# Patient Record
Sex: Male | Born: 1955
Health system: Southern US, Community
[De-identification: ages and names within clinical notes are randomized; demographics above are authoritative.]

## PROBLEM LIST (undated history)

## (undated) ENCOUNTER — Ambulatory Visit: Admission: EM

## (undated) ENCOUNTER — Emergency Department (HOSPITAL_COMMUNITY): Admission: EM | Payer: Medicare Other | Source: Home / Self Care

## (undated) DIAGNOSIS — F32A Depression, unspecified: Secondary | ICD-10-CM

## (undated) DIAGNOSIS — M549 Dorsalgia, unspecified: Secondary | ICD-10-CM

## (undated) DIAGNOSIS — K2970 Gastritis, unspecified, without bleeding: Secondary | ICD-10-CM

## (undated) DIAGNOSIS — K5792 Diverticulitis of intestine, part unspecified, without perforation or abscess without bleeding: Secondary | ICD-10-CM

## (undated) DIAGNOSIS — R110 Nausea: Secondary | ICD-10-CM

## (undated) DIAGNOSIS — I10 Essential (primary) hypertension: Secondary | ICD-10-CM

## (undated) DIAGNOSIS — R339 Retention of urine, unspecified: Secondary | ICD-10-CM

## (undated) DIAGNOSIS — G8929 Other chronic pain: Secondary | ICD-10-CM

## (undated) DIAGNOSIS — N200 Calculus of kidney: Secondary | ICD-10-CM

## (undated) DIAGNOSIS — Z940 Kidney transplant status: Secondary | ICD-10-CM

## (undated) DIAGNOSIS — M25559 Pain in unspecified hip: Secondary | ICD-10-CM

## (undated) DIAGNOSIS — F329 Major depressive disorder, single episode, unspecified: Secondary | ICD-10-CM

## (undated) DIAGNOSIS — N289 Disorder of kidney and ureter, unspecified: Secondary | ICD-10-CM

## (undated) HISTORY — PX: KIDNEY TRANSPLANT: SHX239

## (undated) HISTORY — PX: NEPHRECTOMY TRANSPLANTED ORGAN: SUR880

## (undated) HISTORY — PX: HERNIA REPAIR: SHX51

## (undated) HISTORY — PX: HIP SURGERY: SHX245

---

## 2000-09-25 ENCOUNTER — Encounter: Payer: Self-pay | Admitting: Emergency Medicine

## 2000-09-25 ENCOUNTER — Emergency Department (HOSPITAL_COMMUNITY): Admission: EM | Admit: 2000-09-25 | Discharge: 2000-09-25 | Payer: Self-pay | Admitting: Emergency Medicine

## 2000-12-25 ENCOUNTER — Emergency Department (HOSPITAL_COMMUNITY): Admission: EM | Admit: 2000-12-25 | Discharge: 2000-12-25 | Payer: Self-pay | Admitting: Emergency Medicine

## 2000-12-25 ENCOUNTER — Encounter: Payer: Self-pay | Admitting: Emergency Medicine

## 2000-12-28 ENCOUNTER — Emergency Department (HOSPITAL_COMMUNITY): Admission: EM | Admit: 2000-12-28 | Discharge: 2000-12-28 | Payer: Self-pay | Admitting: Emergency Medicine

## 2001-08-04 ENCOUNTER — Emergency Department (HOSPITAL_COMMUNITY): Admission: EM | Admit: 2001-08-04 | Discharge: 2001-08-04 | Payer: Self-pay | Admitting: *Deleted

## 2001-08-04 ENCOUNTER — Encounter: Payer: Self-pay | Admitting: *Deleted

## 2001-09-08 ENCOUNTER — Encounter: Payer: Self-pay | Admitting: Emergency Medicine

## 2001-09-08 ENCOUNTER — Emergency Department (HOSPITAL_COMMUNITY): Admission: EM | Admit: 2001-09-08 | Discharge: 2001-09-08 | Payer: Self-pay | Admitting: Emergency Medicine

## 2001-10-20 ENCOUNTER — Encounter: Payer: Self-pay | Admitting: *Deleted

## 2001-10-20 ENCOUNTER — Emergency Department (HOSPITAL_COMMUNITY): Admission: EM | Admit: 2001-10-20 | Discharge: 2001-10-20 | Payer: Self-pay | Admitting: Emergency Medicine

## 2001-11-18 ENCOUNTER — Encounter: Payer: Self-pay | Admitting: Emergency Medicine

## 2001-11-18 ENCOUNTER — Emergency Department (HOSPITAL_COMMUNITY): Admission: EM | Admit: 2001-11-18 | Discharge: 2001-11-18 | Payer: Self-pay | Admitting: Emergency Medicine

## 2002-01-23 ENCOUNTER — Emergency Department (HOSPITAL_COMMUNITY): Admission: EM | Admit: 2002-01-23 | Discharge: 2002-01-23 | Payer: Self-pay | Admitting: Emergency Medicine

## 2002-01-23 ENCOUNTER — Encounter: Payer: Self-pay | Admitting: Emergency Medicine

## 2002-01-30 ENCOUNTER — Encounter: Payer: Self-pay | Admitting: Emergency Medicine

## 2002-01-30 ENCOUNTER — Emergency Department (HOSPITAL_COMMUNITY): Admission: EM | Admit: 2002-01-30 | Discharge: 2002-01-30 | Payer: Self-pay | Admitting: Emergency Medicine

## 2002-04-11 ENCOUNTER — Encounter: Payer: Self-pay | Admitting: Internal Medicine

## 2002-04-11 ENCOUNTER — Emergency Department (HOSPITAL_COMMUNITY): Admission: EM | Admit: 2002-04-11 | Discharge: 2002-04-11 | Payer: Self-pay | Admitting: Internal Medicine

## 2002-04-29 ENCOUNTER — Encounter: Payer: Self-pay | Admitting: Emergency Medicine

## 2002-04-29 ENCOUNTER — Emergency Department (HOSPITAL_COMMUNITY): Admission: EM | Admit: 2002-04-29 | Discharge: 2002-04-29 | Payer: Self-pay | Admitting: Emergency Medicine

## 2002-05-01 ENCOUNTER — Encounter: Payer: Self-pay | Admitting: *Deleted

## 2002-05-01 ENCOUNTER — Emergency Department (HOSPITAL_COMMUNITY): Admission: EM | Admit: 2002-05-01 | Discharge: 2002-05-01 | Payer: Self-pay | Admitting: *Deleted

## 2002-06-14 ENCOUNTER — Encounter: Payer: Self-pay | Admitting: Emergency Medicine

## 2002-06-14 ENCOUNTER — Emergency Department (HOSPITAL_COMMUNITY): Admission: EM | Admit: 2002-06-14 | Discharge: 2002-06-14 | Payer: Self-pay | Admitting: Emergency Medicine

## 2002-06-15 ENCOUNTER — Encounter: Payer: Self-pay | Admitting: Orthopedic Surgery

## 2002-06-15 ENCOUNTER — Inpatient Hospital Stay (HOSPITAL_COMMUNITY): Admission: EM | Admit: 2002-06-15 | Discharge: 2002-06-16 | Payer: Self-pay | Admitting: Emergency Medicine

## 2002-07-28 ENCOUNTER — Encounter: Payer: Self-pay | Admitting: Internal Medicine

## 2002-07-28 ENCOUNTER — Emergency Department (HOSPITAL_COMMUNITY): Admission: EM | Admit: 2002-07-28 | Discharge: 2002-07-28 | Payer: Self-pay | Admitting: Internal Medicine

## 2002-08-15 ENCOUNTER — Encounter (HOSPITAL_COMMUNITY): Admission: RE | Admit: 2002-08-15 | Discharge: 2002-09-14 | Payer: Self-pay | Admitting: Orthopedic Surgery

## 2002-11-29 ENCOUNTER — Emergency Department (HOSPITAL_COMMUNITY): Admission: EM | Admit: 2002-11-29 | Discharge: 2002-11-29 | Payer: Self-pay | Admitting: Emergency Medicine

## 2002-12-25 ENCOUNTER — Emergency Department (HOSPITAL_COMMUNITY): Admission: EM | Admit: 2002-12-25 | Discharge: 2002-12-25 | Payer: Self-pay | Admitting: Emergency Medicine

## 2002-12-25 ENCOUNTER — Encounter: Payer: Self-pay | Admitting: Emergency Medicine

## 2003-01-21 ENCOUNTER — Ambulatory Visit (HOSPITAL_COMMUNITY): Admission: RE | Admit: 2003-01-21 | Discharge: 2003-01-21 | Payer: Self-pay | Admitting: *Deleted

## 2003-01-21 ENCOUNTER — Encounter: Payer: Self-pay | Admitting: Urology

## 2003-01-26 ENCOUNTER — Emergency Department (HOSPITAL_COMMUNITY): Admission: EM | Admit: 2003-01-26 | Discharge: 2003-01-26 | Payer: Self-pay | Admitting: Emergency Medicine

## 2003-01-26 ENCOUNTER — Encounter: Payer: Self-pay | Admitting: Emergency Medicine

## 2003-02-07 ENCOUNTER — Ambulatory Visit (HOSPITAL_COMMUNITY): Admission: RE | Admit: 2003-02-07 | Discharge: 2003-02-07 | Payer: Self-pay | Admitting: Urology

## 2003-02-14 ENCOUNTER — Emergency Department (HOSPITAL_COMMUNITY): Admission: EM | Admit: 2003-02-14 | Discharge: 2003-02-14 | Payer: Self-pay

## 2003-03-31 ENCOUNTER — Emergency Department (HOSPITAL_COMMUNITY): Admission: EM | Admit: 2003-03-31 | Discharge: 2003-03-31 | Payer: Self-pay | Admitting: Emergency Medicine

## 2003-04-12 ENCOUNTER — Ambulatory Visit (HOSPITAL_COMMUNITY): Admission: RE | Admit: 2003-04-12 | Discharge: 2003-04-12 | Payer: Self-pay | Admitting: Internal Medicine

## 2003-05-09 ENCOUNTER — Emergency Department (HOSPITAL_COMMUNITY): Admission: EM | Admit: 2003-05-09 | Discharge: 2003-05-09 | Payer: Self-pay | Admitting: Emergency Medicine

## 2003-05-21 ENCOUNTER — Ambulatory Visit (HOSPITAL_COMMUNITY): Admission: RE | Admit: 2003-05-21 | Discharge: 2003-05-21 | Payer: Self-pay | Admitting: Internal Medicine

## 2003-05-23 ENCOUNTER — Ambulatory Visit (HOSPITAL_COMMUNITY): Admission: RE | Admit: 2003-05-23 | Discharge: 2003-05-23 | Payer: Self-pay | Admitting: Internal Medicine

## 2003-05-27 ENCOUNTER — Ambulatory Visit (HOSPITAL_COMMUNITY): Admission: RE | Admit: 2003-05-27 | Discharge: 2003-05-27 | Payer: Self-pay | Admitting: Internal Medicine

## 2003-05-27 HISTORY — PX: COLONOSCOPY: SHX174

## 2003-05-28 ENCOUNTER — Emergency Department (HOSPITAL_COMMUNITY): Admission: EM | Admit: 2003-05-28 | Discharge: 2003-05-28 | Payer: Self-pay | Admitting: Emergency Medicine

## 2003-05-30 ENCOUNTER — Ambulatory Visit (HOSPITAL_COMMUNITY): Admission: RE | Admit: 2003-05-30 | Discharge: 2003-05-30 | Payer: Self-pay | Admitting: Internal Medicine

## 2003-07-16 ENCOUNTER — Ambulatory Visit (HOSPITAL_COMMUNITY): Admission: RE | Admit: 2003-07-16 | Discharge: 2003-07-16 | Payer: Self-pay | Admitting: Internal Medicine

## 2003-08-01 ENCOUNTER — Encounter (HOSPITAL_COMMUNITY): Admission: RE | Admit: 2003-08-01 | Discharge: 2003-08-31 | Payer: Self-pay | Admitting: Internal Medicine

## 2003-11-18 ENCOUNTER — Emergency Department (HOSPITAL_COMMUNITY): Admission: EM | Admit: 2003-11-18 | Discharge: 2003-11-18 | Payer: Self-pay | Admitting: Emergency Medicine

## 2003-12-06 ENCOUNTER — Emergency Department (HOSPITAL_COMMUNITY): Admission: EM | Admit: 2003-12-06 | Discharge: 2003-12-06 | Payer: Self-pay | Admitting: Emergency Medicine

## 2004-01-02 ENCOUNTER — Ambulatory Visit (HOSPITAL_COMMUNITY): Admission: RE | Admit: 2004-01-02 | Discharge: 2004-01-02 | Payer: Self-pay | Admitting: Internal Medicine

## 2004-02-13 ENCOUNTER — Emergency Department (HOSPITAL_COMMUNITY): Admission: EM | Admit: 2004-02-13 | Discharge: 2004-02-13 | Payer: Self-pay | Admitting: Emergency Medicine

## 2004-03-22 ENCOUNTER — Emergency Department (HOSPITAL_COMMUNITY): Admission: EM | Admit: 2004-03-22 | Discharge: 2004-03-23 | Payer: Self-pay | Admitting: Emergency Medicine

## 2004-03-23 ENCOUNTER — Emergency Department (HOSPITAL_COMMUNITY): Admission: EM | Admit: 2004-03-23 | Discharge: 2004-03-23 | Payer: Self-pay | Admitting: Emergency Medicine

## 2004-05-21 ENCOUNTER — Ambulatory Visit: Payer: Self-pay | Admitting: Internal Medicine

## 2004-06-19 ENCOUNTER — Emergency Department (HOSPITAL_COMMUNITY): Admission: EM | Admit: 2004-06-19 | Discharge: 2004-06-19 | Payer: Self-pay | Admitting: Emergency Medicine

## 2004-06-20 ENCOUNTER — Emergency Department (HOSPITAL_COMMUNITY): Admission: EM | Admit: 2004-06-20 | Discharge: 2004-06-20 | Payer: Self-pay | Admitting: Emergency Medicine

## 2004-09-29 ENCOUNTER — Emergency Department (HOSPITAL_COMMUNITY): Admission: EM | Admit: 2004-09-29 | Discharge: 2004-09-29 | Payer: Self-pay | Admitting: Emergency Medicine

## 2004-10-08 ENCOUNTER — Emergency Department (HOSPITAL_COMMUNITY): Admission: EM | Admit: 2004-10-08 | Discharge: 2004-10-08 | Payer: Self-pay | Admitting: Emergency Medicine

## 2005-01-08 ENCOUNTER — Emergency Department (HOSPITAL_COMMUNITY): Admission: EM | Admit: 2005-01-08 | Discharge: 2005-01-08 | Payer: Self-pay | Admitting: Emergency Medicine

## 2005-11-29 ENCOUNTER — Emergency Department (HOSPITAL_COMMUNITY): Admission: EM | Admit: 2005-11-29 | Discharge: 2005-11-29 | Payer: Self-pay | Admitting: Emergency Medicine

## 2007-04-16 ENCOUNTER — Emergency Department (HOSPITAL_COMMUNITY): Admission: EM | Admit: 2007-04-16 | Discharge: 2007-04-16 | Payer: Self-pay | Admitting: Emergency Medicine

## 2007-09-19 ENCOUNTER — Emergency Department (HOSPITAL_COMMUNITY): Admission: EM | Admit: 2007-09-19 | Discharge: 2007-09-19 | Payer: Self-pay | Admitting: Emergency Medicine

## 2007-12-14 ENCOUNTER — Emergency Department (HOSPITAL_COMMUNITY): Admission: EM | Admit: 2007-12-14 | Discharge: 2007-12-14 | Payer: Self-pay | Admitting: Emergency Medicine

## 2007-12-26 ENCOUNTER — Emergency Department (HOSPITAL_COMMUNITY): Admission: EM | Admit: 2007-12-26 | Discharge: 2007-12-26 | Payer: Self-pay | Admitting: Emergency Medicine

## 2008-01-28 ENCOUNTER — Emergency Department (HOSPITAL_COMMUNITY): Admission: EM | Admit: 2008-01-28 | Discharge: 2008-01-28 | Payer: Self-pay | Admitting: Emergency Medicine

## 2008-04-23 ENCOUNTER — Emergency Department (HOSPITAL_COMMUNITY): Admission: EM | Admit: 2008-04-23 | Discharge: 2008-04-23 | Payer: Self-pay | Admitting: Emergency Medicine

## 2008-04-29 ENCOUNTER — Emergency Department (HOSPITAL_COMMUNITY): Admission: EM | Admit: 2008-04-29 | Discharge: 2008-04-29 | Payer: Self-pay | Admitting: Emergency Medicine

## 2008-05-04 ENCOUNTER — Emergency Department (HOSPITAL_COMMUNITY): Admission: EM | Admit: 2008-05-04 | Discharge: 2008-05-04 | Payer: Self-pay | Admitting: Emergency Medicine

## 2008-05-13 ENCOUNTER — Emergency Department (HOSPITAL_COMMUNITY): Admission: EM | Admit: 2008-05-13 | Discharge: 2008-05-13 | Payer: Self-pay | Admitting: Emergency Medicine

## 2008-06-30 ENCOUNTER — Emergency Department (HOSPITAL_COMMUNITY): Admission: EM | Admit: 2008-06-30 | Discharge: 2008-06-30 | Payer: Self-pay | Admitting: Emergency Medicine

## 2009-01-06 ENCOUNTER — Emergency Department (HOSPITAL_COMMUNITY): Admission: EM | Admit: 2009-01-06 | Discharge: 2009-01-06 | Payer: Self-pay | Admitting: Emergency Medicine

## 2009-03-16 ENCOUNTER — Emergency Department (HOSPITAL_COMMUNITY): Admission: EM | Admit: 2009-03-16 | Discharge: 2009-03-16 | Payer: Self-pay | Admitting: Emergency Medicine

## 2009-04-13 ENCOUNTER — Emergency Department (HOSPITAL_COMMUNITY): Admission: EM | Admit: 2009-04-13 | Discharge: 2009-04-13 | Payer: Self-pay | Admitting: Emergency Medicine

## 2009-06-06 ENCOUNTER — Emergency Department (HOSPITAL_COMMUNITY): Admission: EM | Admit: 2009-06-06 | Discharge: 2009-06-06 | Payer: Self-pay | Admitting: Emergency Medicine

## 2009-07-07 ENCOUNTER — Emergency Department (HOSPITAL_COMMUNITY): Admission: EM | Admit: 2009-07-07 | Discharge: 2009-07-07 | Payer: Self-pay | Admitting: Emergency Medicine

## 2009-08-14 ENCOUNTER — Emergency Department (HOSPITAL_COMMUNITY): Admission: EM | Admit: 2009-08-14 | Discharge: 2009-08-14 | Payer: Self-pay | Admitting: Emergency Medicine

## 2009-10-05 ENCOUNTER — Emergency Department (HOSPITAL_COMMUNITY): Admission: EM | Admit: 2009-10-05 | Discharge: 2009-10-05 | Payer: Self-pay | Admitting: Emergency Medicine

## 2009-12-20 ENCOUNTER — Emergency Department (HOSPITAL_COMMUNITY): Admission: EM | Admit: 2009-12-20 | Discharge: 2009-12-20 | Payer: Self-pay | Admitting: Emergency Medicine

## 2009-12-21 ENCOUNTER — Emergency Department (HOSPITAL_COMMUNITY): Admission: EM | Admit: 2009-12-21 | Discharge: 2009-12-21 | Payer: Self-pay | Admitting: Emergency Medicine

## 2009-12-27 ENCOUNTER — Emergency Department (HOSPITAL_COMMUNITY): Admission: EM | Admit: 2009-12-27 | Discharge: 2009-12-27 | Payer: Self-pay | Admitting: Emergency Medicine

## 2010-03-15 ENCOUNTER — Emergency Department (HOSPITAL_COMMUNITY): Admission: EM | Admit: 2010-03-15 | Discharge: 2010-03-15 | Payer: Self-pay | Admitting: Emergency Medicine

## 2010-04-29 ENCOUNTER — Emergency Department (HOSPITAL_COMMUNITY): Admission: EM | Admit: 2010-04-29 | Discharge: 2010-04-29 | Payer: Self-pay | Admitting: Emergency Medicine

## 2010-07-08 ENCOUNTER — Emergency Department (HOSPITAL_COMMUNITY)
Admission: EM | Admit: 2010-07-08 | Discharge: 2010-07-08 | Payer: Self-pay | Source: Home / Self Care | Admitting: Emergency Medicine

## 2010-07-08 LAB — BASIC METABOLIC PANEL
CO2: 23 mEq/L (ref 19–32)
GFR calc Af Amer: >60 mL/min (ref >60)
Potassium: 3.7 mEq/L (ref 3.5–5.1)
Sodium: 136 mEq/L (ref 135–145)

## 2010-07-08 LAB — DIFFERENTIAL
Basophils Absolute: 0 10*3/uL (ref 0.0–0.1)
Basophils Relative: 1 % (ref 0–1)
Lymphs Abs: 2.1 10*3/uL (ref 0.7–4.0)
Neutro Abs: 3.9 10*3/uL (ref 1.7–7.7)
Neutrophils Relative %: 55 % (ref 43–77)

## 2010-07-08 LAB — CBC
Hemoglobin: 14.1 g/dL (ref 13.0–17.0)
MCH: 22.7 pg — ABNORMAL LOW (ref 26.0–34.0)
MCHC: 33.4 g/dL (ref 30.0–36.0)
Platelets: 137 10*3/uL — ABNORMAL LOW (ref 150–400)
RDW: 15.4 % (ref 11.5–15.5)
WBC: 7.2 10*3/uL (ref 4.0–10.5)

## 2010-07-08 LAB — URINALYSIS, ROUTINE W REFLEX MICROSCOPIC
Bilirubin Urine: NEGATIVE
Hgb urine dipstick: NEGATIVE
Ketones, ur: NEGATIVE mg/dL
Protein, ur: NEGATIVE mg/dL
Urobilinogen, UA: 0.2 mg/dL (ref 0.0–1.0)

## 2010-08-18 ENCOUNTER — Emergency Department (HOSPITAL_COMMUNITY)
Admission: EM | Admit: 2010-08-18 | Discharge: 2010-08-18 | Disposition: A | Discharge status: Home / Self Care | Payer: Medicare Other | Attending: Emergency Medicine | Admitting: Emergency Medicine

## 2010-08-18 DIAGNOSIS — Z940 Kidney transplant status: Secondary | ICD-10-CM | POA: Insufficient documentation

## 2010-08-18 DIAGNOSIS — M79609 Pain in unspecified limb: Secondary | ICD-10-CM | POA: Insufficient documentation

## 2010-08-18 DIAGNOSIS — M545 Low back pain, unspecified: Secondary | ICD-10-CM | POA: Insufficient documentation

## 2010-08-18 DIAGNOSIS — M7989 Other specified soft tissue disorders: Secondary | ICD-10-CM | POA: Insufficient documentation

## 2010-08-18 DIAGNOSIS — I10 Essential (primary) hypertension: Secondary | ICD-10-CM | POA: Insufficient documentation

## 2010-08-18 DIAGNOSIS — Z79899 Other long term (current) drug therapy: Secondary | ICD-10-CM | POA: Insufficient documentation

## 2010-08-18 DIAGNOSIS — E7800 Pure hypercholesterolemia, unspecified: Secondary | ICD-10-CM | POA: Insufficient documentation

## 2010-08-26 LAB — URINALYSIS, ROUTINE W REFLEX MICROSCOPIC
Hgb urine dipstick: NEGATIVE
Protein, ur: NEGATIVE mg/dL
Specific Gravity, Urine: 1.025 (ref 1.005–1.030)
Urobilinogen, UA: 1 mg/dL (ref 0.0–1.0)

## 2010-08-26 LAB — POCT CARDIAC MARKERS
Myoglobin, poc: 99.8 ng/mL (ref 12–200)
Troponin i, poc: <0.05 ng/mL (ref 0.00–0.09)

## 2010-08-26 LAB — CBC
MCH: 22.7 pg — ABNORMAL LOW (ref 26.0–34.0)
MCHC: 32 g/dL (ref 30.0–36.0)
MCV: 71 fL — ABNORMAL LOW (ref 78.0–100.0)
Platelets: 136 10*3/uL — ABNORMAL LOW (ref 150–400)

## 2010-08-26 LAB — BASIC METABOLIC PANEL
CO2: 21 mEq/L (ref 19–32)
Calcium: 9.1 mg/dL (ref 8.4–10.5)
Chloride: 100 mEq/L (ref 96–112)
Creatinine, Ser: 1.56 mg/dL — ABNORMAL HIGH (ref 0.4–1.5)
Glucose, Bld: 107 mg/dL — ABNORMAL HIGH (ref 70–99)

## 2010-08-26 LAB — DIFFERENTIAL
Basophils Relative: 0 % (ref 0–1)
Eosinophils Absolute: 0.1 10*3/uL (ref 0.0–0.7)
Lymphs Abs: 1.2 10*3/uL (ref 0.7–4.0)
Monocytes Absolute: 0.3 10*3/uL (ref 0.1–1.0)
Neutrophils Relative %: 73 % (ref 43–77)

## 2010-08-26 LAB — RAPID URINE DRUG SCREEN, HOSP PERFORMED
Amphetamines: NOT DETECTED
Barbiturates: NOT DETECTED
Benzodiazepines: NOT DETECTED

## 2010-08-29 LAB — URINALYSIS, ROUTINE W REFLEX MICROSCOPIC
Glucose, UA: NEGATIVE mg/dL
Nitrite: NEGATIVE
Specific Gravity, Urine: 1.025 (ref 1.005–1.030)
pH: 6 (ref 5.0–8.0)

## 2010-08-29 LAB — BASIC METABOLIC PANEL
BUN: 23 mg/dL (ref 6–23)
Chloride: 108 mEq/L (ref 96–112)
GFR calc Af Amer: 53 mL/min — ABNORMAL LOW (ref >60)
GFR calc non Af Amer: 44 mL/min — ABNORMAL LOW (ref >60)
Potassium: 3.7 mEq/L (ref 3.5–5.1)
Sodium: 136 mEq/L (ref 135–145)

## 2010-08-29 LAB — CBC
HCT: 40.1 % (ref 39.0–52.0)
MCV: 70.9 fL — ABNORMAL LOW (ref 78.0–100.0)
RBC: 5.66 MIL/uL (ref 4.22–5.81)
RDW: 15.1 % (ref 11.5–15.5)
WBC: 6.8 10*3/uL (ref 4.0–10.5)

## 2010-08-29 LAB — HEPATIC FUNCTION PANEL
ALT: 17 U/L (ref 0–53)
AST: 29 U/L (ref 0–37)
Albumin: 3.8 g/dL (ref 3.5–5.2)
Total Bilirubin: 0.8 mg/dL (ref 0.3–1.2)

## 2010-08-29 LAB — DIFFERENTIAL
Basophils Absolute: 0.1 10*3/uL (ref 0.0–0.1)
Eosinophils Relative: 3 % (ref 0–5)
Lymphocytes Relative: 35 % (ref 12–46)
Lymphs Abs: 2.4 10*3/uL (ref 0.7–4.0)
Monocytes Relative: 10 % (ref 3–12)
Neutro Abs: 3.4 10*3/uL (ref 1.7–7.7)

## 2010-08-30 LAB — BASIC METABOLIC PANEL
CO2: 24 mEq/L (ref 19–32)
Chloride: 105 mEq/L (ref 96–112)
GFR calc Af Amer: >60 mL/min (ref >60)
Glucose, Bld: 110 mg/dL — ABNORMAL HIGH (ref 70–99)
Sodium: 135 mEq/L (ref 135–145)

## 2010-08-30 LAB — URINALYSIS, ROUTINE W REFLEX MICROSCOPIC
Glucose, UA: NEGATIVE mg/dL
Hgb urine dipstick: NEGATIVE
Protein, ur: NEGATIVE mg/dL

## 2010-08-30 LAB — LIPASE, BLOOD: Lipase: 24 U/L (ref 11–59)

## 2010-08-30 LAB — CSF CELL COUNT WITH DIFFERENTIAL
RBC Count, CSF: 36 /mm3 — ABNORMAL HIGH
Tube #: 4
WBC, CSF: 0 /mm3 (ref 0–5)

## 2010-08-30 LAB — CBC
Hemoglobin: 13.5 g/dL (ref 13.0–17.0)
MCH: 22.9 pg — ABNORMAL LOW (ref 26.0–34.0)
MCHC: 32.2 g/dL (ref 30.0–36.0)
MCHC: 31.5 g/dL (ref 30.0–36.0)
MCV: 71.2 fL — ABNORMAL LOW (ref 78.0–100.0)
MCV: 72.3 fL — ABNORMAL LOW (ref 78.0–100.0)
Platelets: 156 10*3/uL (ref 150–400)
RBC: 5.88 MIL/uL — ABNORMAL HIGH (ref 4.22–5.81)
RDW: 15.3 % (ref 11.5–15.5)

## 2010-08-30 LAB — PROTIME-INR: Prothrombin Time: 13.6 seconds (ref 11.6–15.2)

## 2010-08-30 LAB — DIFFERENTIAL
Basophils Relative: 0 % (ref 0–1)
Eosinophils Absolute: 0.3 10*3/uL (ref 0.0–0.7)
Eosinophils Relative: 3 % (ref 0–5)
Lymphs Abs: 2.4 10*3/uL (ref 0.7–4.0)
Lymphs Abs: 1.1 10*3/uL (ref 0.7–4.0)
Monocytes Absolute: 0.8 10*3/uL (ref 0.1–1.0)
Neutro Abs: 6.1 10*3/uL (ref 1.7–7.7)
Neutro Abs: 6.2 10*3/uL (ref 1.7–7.7)
Neutrophils Relative %: 75 % (ref 43–77)

## 2010-08-30 LAB — URINE CULTURE: Culture: NO GROWTH

## 2010-08-30 LAB — COMPREHENSIVE METABOLIC PANEL
ALT: 17 U/L (ref 0–53)
BUN: 15 mg/dL (ref 6–23)
CO2: 23 mEq/L (ref 19–32)
Calcium: 9.1 mg/dL (ref 8.4–10.5)
Creatinine, Ser: 1.27 mg/dL (ref 0.4–1.5)
GFR calc non Af Amer: 59 mL/min — ABNORMAL LOW (ref >60)
Glucose, Bld: 116 mg/dL — ABNORMAL HIGH (ref 70–99)
Sodium: 134 mEq/L — ABNORMAL LOW (ref 135–145)

## 2010-08-30 LAB — PROTEIN, CSF: Total  Protein, CSF: 31 mg/dL (ref 15–45)

## 2010-08-30 LAB — CK TOTAL AND CKMB (NOT AT ARMC): Total CK: 105 U/L (ref 7–232)

## 2010-08-30 LAB — CSF CULTURE W GRAM STAIN: Culture: NO GROWTH

## 2010-09-14 LAB — COMPREHENSIVE METABOLIC PANEL
ALT: 55 U/L — ABNORMAL HIGH (ref 0–53)
AST: 213 U/L — ABNORMAL HIGH (ref 0–37)
CO2: 24 mEq/L (ref 19–32)
Calcium: 9.3 mg/dL (ref 8.4–10.5)
GFR calc Af Amer: >60 mL/min (ref >60)
Sodium: 137 mEq/L (ref 135–145)
Total Protein: 7.6 g/dL (ref 6.0–8.3)

## 2010-09-14 LAB — CK TOTAL AND CKMB (NOT AT ARMC): Total CK: 10472 U/L — ABNORMAL HIGH (ref 7–232)

## 2010-09-14 LAB — RAPID URINE DRUG SCREEN, HOSP PERFORMED
Amphetamines: NOT DETECTED
Barbiturates: NOT DETECTED
Benzodiazepines: NOT DETECTED

## 2010-09-14 LAB — POCT CARDIAC MARKERS
Myoglobin, poc: >500 ng/mL (ref 12–200)
Troponin i, poc: <0.05 ng/mL (ref 0.00–0.09)

## 2010-09-14 LAB — DIFFERENTIAL
Eosinophils Absolute: 0.2 10*3/uL (ref 0.0–0.7)
Eosinophils Relative: 3 % (ref 0–5)
Lymphs Abs: 1.4 10*3/uL (ref 0.7–4.0)
Monocytes Relative: 8 % (ref 3–12)

## 2010-09-14 LAB — CBC
MCHC: 32.2 g/dL (ref 30.0–36.0)
RBC: 5.41 MIL/uL (ref 4.22–5.81)
RDW: 15.1 % (ref 11.5–15.5)

## 2010-09-17 LAB — CBC
MCHC: 33.1 g/dL (ref 30.0–36.0)
MCV: 71.3 fL — ABNORMAL LOW (ref 78.0–100.0)
Platelets: 130 10*3/uL — ABNORMAL LOW (ref 150–400)
RDW: 15.7 % — ABNORMAL HIGH (ref 11.5–15.5)
WBC: 7 10*3/uL (ref 4.0–10.5)

## 2010-09-17 LAB — COMPREHENSIVE METABOLIC PANEL
AST: 37 U/L (ref 0–37)
Albumin: 4.2 g/dL (ref 3.5–5.2)
Calcium: 9.5 mg/dL (ref 8.4–10.5)
Chloride: 103 mEq/L (ref 96–112)
Creatinine, Ser: 1.26 mg/dL (ref 0.4–1.5)
GFR calc Af Amer: >60 mL/min (ref >60)
Total Protein: 8.2 g/dL (ref 6.0–8.3)

## 2010-09-17 LAB — DIFFERENTIAL
Eosinophils Relative: 2 % (ref 0–5)
Lymphocytes Relative: 12 % (ref 12–46)
Lymphs Abs: 0.9 10*3/uL (ref 0.7–4.0)
Monocytes Absolute: 0.6 10*3/uL (ref 0.1–1.0)
Neutro Abs: 5.4 10*3/uL (ref 1.7–7.7)

## 2010-09-17 LAB — URINALYSIS, ROUTINE W REFLEX MICROSCOPIC
Ketones, ur: 15 mg/dL — AB
Protein, ur: NEGATIVE mg/dL
Urobilinogen, UA: 2 mg/dL — ABNORMAL HIGH (ref 0.0–1.0)

## 2010-09-17 LAB — RAPID URINE DRUG SCREEN, HOSP PERFORMED
Amphetamines: NOT DETECTED
Benzodiazepines: NOT DETECTED
Opiates: NOT DETECTED
Tetrahydrocannabinol: POSITIVE — AB

## 2010-09-17 LAB — URINE MICROSCOPIC-ADD ON

## 2010-09-20 ENCOUNTER — Emergency Department (HOSPITAL_COMMUNITY)
Admission: EM | Admit: 2010-09-20 | Discharge: 2010-09-20 | Disposition: A | Discharge status: Home / Self Care | Payer: Medicare Other | Attending: Emergency Medicine | Admitting: Emergency Medicine

## 2010-09-20 DIAGNOSIS — Z940 Kidney transplant status: Secondary | ICD-10-CM | POA: Insufficient documentation

## 2010-09-20 DIAGNOSIS — I10 Essential (primary) hypertension: Secondary | ICD-10-CM | POA: Insufficient documentation

## 2010-09-20 DIAGNOSIS — Z79899 Other long term (current) drug therapy: Secondary | ICD-10-CM | POA: Insufficient documentation

## 2010-09-20 DIAGNOSIS — E7800 Pure hypercholesterolemia, unspecified: Secondary | ICD-10-CM | POA: Insufficient documentation

## 2010-09-20 DIAGNOSIS — W57XXXA Bitten or stung by nonvenomous insect and other nonvenomous arthropods, initial encounter: Secondary | ICD-10-CM | POA: Insufficient documentation

## 2010-09-20 DIAGNOSIS — S30860A Insect bite (nonvenomous) of lower back and pelvis, initial encounter: Secondary | ICD-10-CM | POA: Insufficient documentation

## 2010-09-20 LAB — DIFFERENTIAL
Eosinophils Absolute: 0.4 10*3/uL (ref 0.0–0.7)
Lymphs Abs: 3.7 10*3/uL (ref 0.7–4.0)
Monocytes Relative: 8 % (ref 3–12)
Neutro Abs: 4.8 10*3/uL (ref 1.7–7.7)
Neutrophils Relative %: 49 % (ref 43–77)

## 2010-09-20 LAB — URINALYSIS, ROUTINE W REFLEX MICROSCOPIC
Bilirubin Urine: NEGATIVE
Hgb urine dipstick: NEGATIVE
Nitrite: NEGATIVE
Specific Gravity, Urine: 1.02 (ref 1.005–1.030)
pH: 6 (ref 5.0–8.0)

## 2010-09-20 LAB — CBC
Hemoglobin: 14.1 g/dL (ref 13.0–17.0)
MCV: 71.5 fL — ABNORMAL LOW (ref 78.0–100.0)
RBC: 6.03 MIL/uL — ABNORMAL HIGH (ref 4.22–5.81)
WBC: 9.7 10*3/uL (ref 4.0–10.5)

## 2010-09-20 LAB — URINE MICROSCOPIC-ADD ON

## 2010-09-20 LAB — BASIC METABOLIC PANEL
Chloride: 102 mEq/L (ref 96–112)
Creatinine, Ser: 1.51 mg/dL — ABNORMAL HIGH (ref 0.4–1.5)
GFR calc Af Amer: 59 mL/min — ABNORMAL LOW (ref >60)
Sodium: 135 mEq/L (ref 135–145)

## 2010-09-21 ENCOUNTER — Emergency Department (HOSPITAL_COMMUNITY)
Admission: EM | Admit: 2010-09-21 | Discharge: 2010-09-21 | Disposition: A | Discharge status: Home / Self Care | Payer: Medicare Other | Attending: Emergency Medicine | Admitting: Emergency Medicine

## 2010-09-21 DIAGNOSIS — W57XXXA Bitten or stung by nonvenomous insect and other nonvenomous arthropods, initial encounter: Secondary | ICD-10-CM | POA: Insufficient documentation

## 2010-09-21 DIAGNOSIS — R609 Edema, unspecified: Secondary | ICD-10-CM | POA: Insufficient documentation

## 2010-09-21 DIAGNOSIS — T7840XA Allergy, unspecified, initial encounter: Secondary | ICD-10-CM | POA: Insufficient documentation

## 2010-09-21 DIAGNOSIS — M545 Low back pain, unspecified: Secondary | ICD-10-CM | POA: Insufficient documentation

## 2010-09-21 DIAGNOSIS — S30860A Insect bite (nonvenomous) of lower back and pelvis, initial encounter: Secondary | ICD-10-CM | POA: Insufficient documentation

## 2010-09-21 DIAGNOSIS — E7800 Pure hypercholesterolemia, unspecified: Secondary | ICD-10-CM | POA: Insufficient documentation

## 2010-09-21 DIAGNOSIS — Z940 Kidney transplant status: Secondary | ICD-10-CM | POA: Insufficient documentation

## 2010-09-21 DIAGNOSIS — Y929 Unspecified place or not applicable: Secondary | ICD-10-CM | POA: Insufficient documentation

## 2010-09-21 DIAGNOSIS — I10 Essential (primary) hypertension: Secondary | ICD-10-CM | POA: Insufficient documentation

## 2010-09-22 ENCOUNTER — Emergency Department (HOSPITAL_COMMUNITY)
Admission: EM | Admit: 2010-09-22 | Discharge: 2010-09-22 | Disposition: A | Discharge status: Home / Self Care | Payer: Medicare Other | Attending: Emergency Medicine | Admitting: Emergency Medicine

## 2010-09-22 DIAGNOSIS — W57XXXA Bitten or stung by nonvenomous insect and other nonvenomous arthropods, initial encounter: Secondary | ICD-10-CM | POA: Insufficient documentation

## 2010-09-22 DIAGNOSIS — I10 Essential (primary) hypertension: Secondary | ICD-10-CM | POA: Insufficient documentation

## 2010-09-22 DIAGNOSIS — Z9689 Presence of other specified functional implants: Secondary | ICD-10-CM | POA: Insufficient documentation

## 2010-09-22 DIAGNOSIS — S30860A Insect bite (nonvenomous) of lower back and pelvis, initial encounter: Secondary | ICD-10-CM | POA: Insufficient documentation

## 2010-09-23 ENCOUNTER — Emergency Department (HOSPITAL_COMMUNITY)
Admission: EM | Admit: 2010-09-23 | Discharge: 2010-09-23 | Disposition: A | Discharge status: Home / Self Care | Payer: Medicare Other | Attending: Emergency Medicine | Admitting: Emergency Medicine

## 2010-09-23 DIAGNOSIS — L02219 Cutaneous abscess of trunk, unspecified: Secondary | ICD-10-CM | POA: Insufficient documentation

## 2010-09-28 LAB — COMPREHENSIVE METABOLIC PANEL
CO2: 25 mEq/L (ref 19–32)
Calcium: 9.1 mg/dL (ref 8.4–10.5)
Creatinine, Ser: 1.42 mg/dL (ref 0.4–1.5)
GFR calc non Af Amer: 52 mL/min — ABNORMAL LOW (ref >60)
Glucose, Bld: 99 mg/dL (ref 70–99)

## 2010-09-28 LAB — URINE CULTURE

## 2010-09-28 LAB — PROTIME-INR: Prothrombin Time: 14.5 seconds (ref 11.6–15.2)

## 2010-09-28 LAB — CBC
Hemoglobin: 13 g/dL (ref 13.0–17.0)
MCHC: 31.9 g/dL (ref 30.0–36.0)
MCV: 70.3 fL — ABNORMAL LOW (ref 78.0–100.0)
RBC: 5.79 MIL/uL (ref 4.22–5.81)

## 2010-09-28 LAB — LIPASE, BLOOD: Lipase: 21 U/L (ref 11–59)

## 2010-09-28 LAB — DIFFERENTIAL
Eosinophils Absolute: 0.2 10*3/uL (ref 0.0–0.7)
Lymphocytes Relative: 4 % — ABNORMAL LOW (ref 12–46)
Lymphs Abs: 0.4 10*3/uL — ABNORMAL LOW (ref 0.7–4.0)
Neutrophils Relative %: 89 % — ABNORMAL HIGH (ref 43–77)

## 2010-09-28 LAB — URINALYSIS, ROUTINE W REFLEX MICROSCOPIC
Hgb urine dipstick: NEGATIVE
Protein, ur: NEGATIVE mg/dL
Urobilinogen, UA: 0.2 mg/dL (ref 0.0–1.0)

## 2010-10-24 ENCOUNTER — Emergency Department (HOSPITAL_COMMUNITY): Payer: Medicare Other

## 2010-10-24 ENCOUNTER — Emergency Department (HOSPITAL_COMMUNITY)
Admission: EM | Admit: 2010-10-24 | Discharge: 2010-10-24 | Disposition: A | Discharge status: Home / Self Care | Payer: Medicare Other | Attending: Emergency Medicine | Admitting: Emergency Medicine

## 2010-10-24 DIAGNOSIS — E7800 Pure hypercholesterolemia, unspecified: Secondary | ICD-10-CM | POA: Insufficient documentation

## 2010-10-24 DIAGNOSIS — R109 Unspecified abdominal pain: Secondary | ICD-10-CM | POA: Insufficient documentation

## 2010-10-24 DIAGNOSIS — Z940 Kidney transplant status: Secondary | ICD-10-CM | POA: Insufficient documentation

## 2010-10-24 DIAGNOSIS — I10 Essential (primary) hypertension: Secondary | ICD-10-CM | POA: Insufficient documentation

## 2010-10-24 DIAGNOSIS — K5732 Diverticulitis of large intestine without perforation or abscess without bleeding: Secondary | ICD-10-CM | POA: Insufficient documentation

## 2010-10-24 LAB — DIFFERENTIAL
Eosinophils Relative: 1 % (ref 0–5)
Lymphocytes Relative: 16 % (ref 12–46)
Lymphs Abs: 2.1 10*3/uL (ref 0.7–4.0)
Neutro Abs: 10.2 10*3/uL — ABNORMAL HIGH (ref 1.7–7.7)

## 2010-10-24 LAB — COMPREHENSIVE METABOLIC PANEL
ALT: 21 U/L (ref 0–53)
AST: 27 U/L (ref 0–37)
Alkaline Phosphatase: 83 U/L (ref 39–117)
CO2: 28 mEq/L (ref 19–32)
Chloride: 98 mEq/L (ref 96–112)
GFR calc Af Amer: >60 mL/min (ref >60)
GFR calc non Af Amer: 52 mL/min — ABNORMAL LOW (ref >60)
Glucose, Bld: 97 mg/dL (ref 70–99)
Potassium: 4.2 mEq/L (ref 3.5–5.1)
Sodium: 136 mEq/L (ref 135–145)

## 2010-10-24 LAB — URINALYSIS, ROUTINE W REFLEX MICROSCOPIC
Bilirubin Urine: NEGATIVE
Glucose, UA: NEGATIVE mg/dL
Hgb urine dipstick: NEGATIVE
Specific Gravity, Urine: 1.025 (ref 1.005–1.030)
pH: 6 (ref 5.0–8.0)

## 2010-10-24 LAB — CBC
HCT: 44.7 % (ref 39.0–52.0)
Hemoglobin: 14.4 g/dL (ref 13.0–17.0)
MCV: 69.4 fL — ABNORMAL LOW (ref 78.0–100.0)
RBC: 6.44 MIL/uL — ABNORMAL HIGH (ref 4.22–5.81)
RDW: 15.6 % — ABNORMAL HIGH (ref 11.5–15.5)
WBC: 13.6 10*3/uL — ABNORMAL HIGH (ref 4.0–10.5)

## 2010-10-24 LAB — LIPASE, BLOOD: Lipase: 27 U/L (ref 11–59)

## 2010-10-25 ENCOUNTER — Emergency Department (HOSPITAL_COMMUNITY)
Admission: EM | Admit: 2010-10-25 | Discharge: 2010-10-25 | Disposition: A | Discharge status: Home / Self Care | Payer: Medicare Other | Attending: Emergency Medicine | Admitting: Emergency Medicine

## 2010-10-25 ENCOUNTER — Emergency Department (HOSPITAL_COMMUNITY): Payer: Medicare Other

## 2010-10-25 DIAGNOSIS — K5732 Diverticulitis of large intestine without perforation or abscess without bleeding: Secondary | ICD-10-CM | POA: Insufficient documentation

## 2010-10-25 DIAGNOSIS — Z940 Kidney transplant status: Secondary | ICD-10-CM | POA: Insufficient documentation

## 2010-10-25 DIAGNOSIS — I10 Essential (primary) hypertension: Secondary | ICD-10-CM | POA: Insufficient documentation

## 2010-10-25 DIAGNOSIS — E7800 Pure hypercholesterolemia, unspecified: Secondary | ICD-10-CM | POA: Insufficient documentation

## 2010-10-25 DIAGNOSIS — R109 Unspecified abdominal pain: Secondary | ICD-10-CM | POA: Insufficient documentation

## 2010-10-25 DIAGNOSIS — Z79899 Other long term (current) drug therapy: Secondary | ICD-10-CM | POA: Insufficient documentation

## 2010-10-25 LAB — CBC
HCT: 45.5 % (ref 39.0–52.0)
MCHC: 32.7 g/dL (ref 30.0–36.0)
MCV: 69.8 fL — ABNORMAL LOW (ref 78.0–100.0)
Platelets: 177 10*3/uL (ref 150–400)
RDW: 15.6 % — ABNORMAL HIGH (ref 11.5–15.5)
WBC: 14.7 10*3/uL — ABNORMAL HIGH (ref 4.0–10.5)

## 2010-10-25 LAB — DIFFERENTIAL
Basophils Absolute: 0 10*3/uL (ref 0.0–0.1)
Eosinophils Absolute: 0.2 10*3/uL (ref 0.0–0.7)
Eosinophils Relative: 2 % (ref 0–5)
Monocytes Absolute: 1.2 10*3/uL — ABNORMAL HIGH (ref 0.1–1.0)

## 2010-10-25 LAB — COMPREHENSIVE METABOLIC PANEL
ALT: 19 U/L (ref 0–53)
AST: 31 U/L (ref 0–37)
Creatinine, Ser: 1.39 mg/dL (ref 0.4–1.5)
GFR calc non Af Amer: 53 mL/min — ABNORMAL LOW (ref >60)
Potassium: 4 mEq/L (ref 3.5–5.1)
Total Bilirubin: 0.5 mg/dL (ref 0.3–1.2)

## 2010-10-26 ENCOUNTER — Emergency Department (HOSPITAL_COMMUNITY): Payer: Medicare Other

## 2010-10-26 ENCOUNTER — Emergency Department (HOSPITAL_COMMUNITY)
Admission: EM | Admit: 2010-10-26 | Discharge: 2010-10-26 | Disposition: A | Discharge status: Home / Self Care | Payer: Medicare Other | Attending: Emergency Medicine | Admitting: Emergency Medicine

## 2010-10-26 DIAGNOSIS — R197 Diarrhea, unspecified: Secondary | ICD-10-CM | POA: Insufficient documentation

## 2010-10-26 DIAGNOSIS — E7800 Pure hypercholesterolemia, unspecified: Secondary | ICD-10-CM | POA: Insufficient documentation

## 2010-10-26 DIAGNOSIS — I10 Essential (primary) hypertension: Secondary | ICD-10-CM | POA: Insufficient documentation

## 2010-10-26 DIAGNOSIS — R109 Unspecified abdominal pain: Secondary | ICD-10-CM | POA: Insufficient documentation

## 2010-10-26 DIAGNOSIS — Z940 Kidney transplant status: Secondary | ICD-10-CM | POA: Insufficient documentation

## 2010-10-26 DIAGNOSIS — K5732 Diverticulitis of large intestine without perforation or abscess without bleeding: Secondary | ICD-10-CM | POA: Insufficient documentation

## 2010-10-26 DIAGNOSIS — Z79899 Other long term (current) drug therapy: Secondary | ICD-10-CM | POA: Insufficient documentation

## 2010-10-26 DIAGNOSIS — K219 Gastro-esophageal reflux disease without esophagitis: Secondary | ICD-10-CM | POA: Insufficient documentation

## 2010-10-26 LAB — COMPREHENSIVE METABOLIC PANEL
ALT: 17 U/L (ref 0–53)
AST: 26 U/L (ref 0–37)
Alkaline Phosphatase: 72 U/L (ref 39–117)
CO2: 28 mEq/L (ref 19–32)
Chloride: 99 mEq/L (ref 96–112)
GFR calc Af Amer: >60 mL/min (ref >60)
GFR calc non Af Amer: 59 mL/min — ABNORMAL LOW (ref >60)
Sodium: 136 mEq/L (ref 135–145)
Total Bilirubin: 0.4 mg/dL (ref 0.3–1.2)

## 2010-10-26 LAB — DIFFERENTIAL
Basophils Absolute: 0 10*3/uL (ref 0.0–0.1)
Basophils Relative: 0 % (ref 0–1)
Eosinophils Relative: 2 % (ref 0–5)
Monocytes Absolute: 0.8 10*3/uL (ref 0.1–1.0)

## 2010-10-26 LAB — CBC
MCHC: 32.3 g/dL (ref 30.0–36.0)
RDW: 15.3 % (ref 11.5–15.5)

## 2010-10-26 MED ORDER — IOHEXOL 300 MG/ML  SOLN
100.0000 mL | Freq: Once | INTRAMUSCULAR | Status: AC | PRN
  Administered 2010-10-26: 100 mL via INTRAVENOUS

## 2010-10-30 NOTE — Op Note (Signed)
NAME:  Robert Durham, Robert Durham                         ACCOUNT NO.:  [id number]   MEDICAL RECORD NO.:  [id number]                   PATIENT TYPE:  AMB   LOCATION:  DAY                                  FACILITY:  [facility]   PHYSICIAN:  R. Roetta Sessions, M.D.              DATE OF BIRTH:  Jun 25, 1955   DATE OF PROCEDURE:  05/27/2003  DATE OF DISCHARGE:                                 OPERATIVE REPORT   PROCEDURE:  Diagnostic colonoscopy.   INDICATIONS FOR PROCEDURE:  The patient is a 55 year old gentleman with  chronic nausea, vomiting, and upper abdominal pain.  He recently underwent  EGD which was normal.  Gallbladder ultrasound last week was also normal.  Because of his Hemoccult-positive status, he is undergoing colonoscopy.  This approach has been discussed with the patient at length.  The potential  risks, benefits, and alternatives have been reviewed and questions answered.  There has been on interim change in the patient's health status since seen  on 05/21/2003.  Please see my 05/15/2003 consultation note for more  information.   PROCEDURE:  O2 saturation, blood pressure, pulses, and respirations were  monitored throughout the entire procedure.  Conscious sedation was with  Versed 4 mg IV, Demerol 75 mg IV in divided doses.  The instrument used was  the Olympus video chip adult colonoscope.   FINDINGS:  Digital rectal examination revealed no abnormalities.   ENDOSCOPIC FINDINGS:  The prep was adequate.   Rectum:  Examination of the rectal mucosa including retroflex view of the  anal verge revealed no abnormalities.   Colon:  The colonic mucosa was surveyed from the rectosigmoid junction  through the left, transverse, right colon to the area of the appendiceal  orifice, ileocecal valve, and cecum.  These structures were well-seen and  photographed for the record.  The colonic mucosa all the way to the cecum  appeared normal.  The terminal ileum was intubated 10 cm.  There was minimal  hyperemia.  The mucosa appeared essentially normal.  From the level of the  cecum and ileocecal valve, the scope was slowly withdrawn.  All previously  mentioned mucosal surfaces were again seen, and no abnormalities were  observed.  The patient tolerated the procedure well and was reactive in  endoscopy.   IMPRESSION:  1. Normal rectum.  2. Normal colon.  3. Normal terminal ileum.   RECOMMENDATIONS:  1. Will proceed with a HIDA with fatty meal challenge to further evaluate     this gentleman for occult gallbladder     disease.  Delayed gastric emptying is also in the differential; however,     usually a patient with gastroparesis really does not have much in the way     of frank abdominal pain.  2. Further recommendations to follow pending review of the HIDA.      ___________________________________________  Jonathon Bellows, M.D.   RMR/MEDQ  D:  05/27/2003  T:  05/27/2003  Job:  [id number]   cc:   Tesfaye D. Felecia Shelling, M.D.  [address]  [place]  [place] [address]  Fax: [phone]

## 2010-10-30 NOTE — Op Note (Signed)
   NAME:  [name], [name]                         ACCOUNT NO.:  [id number]   MEDICAL RECORD NO.:  [id number]                   PATIENT TYPE:  INP   LOCATION:  5735                                 FACILITY:  [facility]   PHYSICIAN:  Dionne Ano. Everlene Other, M.D.         DATE OF BIRTH:  Oct 22, 1955   DATE OF PROCEDURE:  DATE OF DISCHARGE:                                 OPERATIVE REPORT   SUMMARY:  Robert Durham was admitted June 15, 2002 secondary to open  fracture of the fifth metacarpal. He was referred from [facility].  The patient was taken to the operative suite and underwent I&D and ORIF of  his fifth metacarpal right hand. He did well postoperatively without  complication. Postoperative day two, he was stable, awake, alert and  oriented, tolerating a regular diet and had no postoperative complications.  He received postoperative antibiotics in the form of Ancef and did quite  well with this. We are going to DC him today after his afternoon dose of  antibiotics with follow-up with Dr. Amanda Pea in 10-14 days. I have discharged  him on Vicodin as well as Robaxin to be taken as directed. (Bactrim 1-2 q. 4-  6h p.r.n. pain p.o. and Robaxin 500 mg 1 p.o. q. 6-8h p.r.n. spasm p.o.) If  he has any problems, he will notify me prior to our scheduled date of office  visit in 10-14 days. All questions have been encouraged and answered. I  should note the patient does have a history of a renal transplant. There  were no postoperative complications that would relate to his previous renal  transplantation and his kidney function remained excellent during his entire  hospital stay.                                               Dionne Ano. Everlene Other, M.D.    [name]  D:  06/16/2002  T:  06/16/2002  Job:  [id number]

## 2010-10-30 NOTE — Op Note (Signed)
   NAME:  Robert Durham, Robert Durham                         ACCOUNT NO.:  [id number]   MEDICAL RECORD NO.:  [id number]                   PATIENT TYPE:  AMB   LOCATION:  DAY                                  FACILITY:  [facility]   PHYSICIAN:  Dennie Maizes, M.D.                DATE OF BIRTH:  12/03/1955   DATE OF PROCEDURE:  02/07/2003  DATE OF DISCHARGE:                                 OPERATIVE REPORT   PREOPERATIVE DIAGNOSIS:  Symptomatic left hydrocele.   POSTOPERATIVE DIAGNOSIS:  Symptomatic left hydrocele.   PROCEDURE:  Left hydrocelectomy.   ANESTHESIA:  Spinal.   SURGEON:  Dennie Maizes, M.D.   COMPLICATIONS:  None.   ESTIMATED BLOOD LOSS:  Minimal.   INDICATIONS FOR PROCEDURE:  This 55 year old male with a symptomatic left  hydrocele was taken to the OR today for left hydrocelectomy and anesthesia.   DESCRIPTION OF PROCEDURE:  Spinal anesthesia was induced and the patient was  placed on the OR table in the supine position. Examination revealed a  moderate size left hydrocele. The abdomen and genitalia were prepped and  draped in a sterile fashion. A transverse incision was made in the left  hemiscrotal skin. The layers of the scrotum were incised to expose the  hydrocele sac. The hydrocele sac was then dissected free by sharp and blunt  dissection. An incision was made in the hydrocele sac and about 150 mL of  clear fluid was drained. Examination revealed a normal testis. The  epididymus was found to be enlarged and thickened. The hydrocele sac was  then reversed over the testis and spermatic cord. The ______ sac was then  approximated using 3-0 Vicryl. There was no  active bleeding at this time. The testis was then repositioned in the  scrotum. The scrotal incision was closed in two layers using 4-0 Chromic  gut. Fluffs and scrotal support were applied. The patient was transferred to  the PACU in satisfactory condition.                                               Dennie Maizes, M.D.    SK/MEDQ  D:  02/07/2003  T:  02/07/2003  Job:  [id number]   cc:   Annia Friendly. Loleta Chance, M.D.  [address]  [place]  [place] [address]  Fax: [phone]

## 2010-10-30 NOTE — Op Note (Signed)
NAME:  Robert Durham, Robert Durham                         ACCOUNT NO.:  [id number]   MEDICAL RECORD NO.:  [id number]                   PATIENT TYPE:  INP   LOCATION:  5735                                 FACILITY:  [facility]   PHYSICIAN:  Dionne Ano. Everlene Other, M.D.         DATE OF BIRTH:  [birth year]   DATE OF PROCEDURE:  DATE OF DISCHARGE:                                 OPERATIVE REPORT   PREOPERATIVE DIAGNOSIS:  Open right fifth metacarpal fracture with  displacement.   POSTOPERATIVE DIAGNOSIS:  Open right fifth metacarpal fracture with  displacement.   PROCEDURES:  1. Irrigation and debridement of skin, subcutaneous tissue, bone, tendinous     tissue, and muscle tissue in the form of excisional debridement secondary     to an open fracture about the fifth metacarpal, right hand.  2. Open reduction and internal fixation of fifth metacarpal, right hand.   SURGEON:  Dionne Ano. Amanda Pea, M.D.   COMPLICATIONS:  None.   ANESTHESIA:  General.   TOURNIQUET TIME:  Less than an hour.   ESTIMATED BLOOD LOSS:  Less than 50 cc.   DRAINS:  None.   INDICATION FOR PROCEDURE:  This patient is a 55 year old male who presents  with the above-mentioned diagnosis.  He has had a prior renal transplant 10  years ago and is doing fairly well from this except for some GI ailments  that are being worked up.  The patient injured his hand tonight when it  received a blow against a wall.  He was subsequently seen at [facility] and transferred for our services at [facility] secondary to an open  fracture about the fifth metacarpal.  I have counseled him in regard to the  risks and benefits of surgery, including risk of infection, bleeding, damage  to normal structures, and failure of surgery to accomplish its intended  goals of relieving symptoms and restoring function.  With this in mind, he  desires to proceed.  All questions have been encouraged and answered  preoperatively.   OPERATIVE FINDINGS:   This patient had an open metacarpal fracture.  He  underwent I&D and repair as necessary.  His surgery went uneventfully.  We  were able to get a nice I&D and good apposition of the bony fragments with  intramedullary fixation technique.   DESCRIPTION OF PROCEDURE:  The patient was seen by myself and anesthesia,  consent was signed, and the patient was subsequently taken to the operative  suite.  I should note he previously had tetanus and antibiotics given and  was given an additional dose of antibiotics prior to skin incision.  Once in  the operative suite, he underwent a smooth induction of general anesthesia  under the direction of Burna Forts, M.D.  Once this was done, the  patient then was laid supine, appropriately padded, prepped and draped in  the usual sterile fashion with Betadine scrub and paint about  the right  upper extremity.  Once this was done, the patient had an incision made  overlying the fracture site with the tourniquet elevated at 250 mmHg.  Incision was carried through skin with knife blade through the puncture  hole.  I then dissected down and removed portions of his skin and subcu  where the open fracture was previously noted.  Following this the patient  then had I&D of the bone and muscle tissue in the surrounding area.  I took  care to preserve the [facility] and EDM to the fifth finger.  Once I&D was  accomplished with greater than 3 L of bulb syringe saline applied to the  area, we then made an incision at the base of the metacarpal.  Following  this a drill hole was placed and three intramedullary rods were placed, two  of the 0.045 variety and one of the 0.062 variety.  This achieved excellent  purchase, reduced the fracture site nicely, and provided excellent fixation.  I was happy with the finger splay.  At this point in time the tourniquet was  deflated and hemostasis obtained.  Pins were bent so that they would not  impinge upon the ECU or adjacent  structures.  Following this the patient had  soft tissues repaired at the open fracture site with Vicryl and the wounds  were then sutured with Prolene.  He had soft compartments, excellent refill,  and good hemostasis.  The fracture site was particularly comminuted.  However, I was pleased with the fixation and, hopefully, this patient will  go on to have complete bony union.  We will admit him to the hospital for IV  antibiotics, pain control, and observation.   I have discussed with the patient all issues.  The patient was extubated and  transferred to the recovery room.  All sponge, needle, and instrument counts  were reported as correct.  It has been a pleasure to participate in his  care.  We will look forward to participating in his postoperative recovery.                                               Dionne Ano. Everlene Other, M.D.    [name]  D:  06/15/2002  T:  06/16/2002  Job:  [id number]

## 2010-10-30 NOTE — Op Note (Signed)
NAME:  Robert Durham, Robert Durham                         ACCOUNT NO.:  [id number]   MEDICAL RECORD NO.:  [id number]                   PATIENT TYPE:  AMB   LOCATION:  DAY                                  FACILITY:  [facility]   PHYSICIAN:  R. Roetta Sessions, M.D.              DATE OF BIRTH:  [birth year]   DATE OF PROCEDURE:  DATE OF DISCHARGE:                                 OPERATIVE REPORT   PROCEDURE:  Diagnostic esophagogastroduodenoscopy.   ENDOSCOPIST:  Gerrit Friends. Rourk, M.D.   INDICATIONS FOR PROCEDURE:  The patient is a 55 year old gentleman with  intermittent epigastric and right upper quadrant abdominal pain, nausea and  vomiting.  He is Hemoccult-positive.  A gastric emptying study April 12, 2003 was normal.  EGD is now being done to further evaluate his symptoms.  The potential risks, benefits, and alternatives have been reviewed;  questions answered.  He is agreeable.  Please see documentation in the  medical record and hospital dictated consultation note of May 15, 2003  for more information.   PROCEDURE NOTE:  O2 saturation, blood pressure, pulse and respirations were  monitored throughout the entire procedure.  Conscious sedation: Versed 3 mg  IV, Demerol 75 mg IV in divided doses.   INSTRUMENT:  Olympus GIF-160 gastroscope.   FINDINGS:  Examination of the tubular esophagus revealed no mucosal  abnormalities.  The EG junction was easily traversed.   STOMACH:  The gastric cavity was empty.  It insufflated well with air.  A  thorough examination of the gastric mucosa including a retroflex view of the  proximal stomach and esophagogastric junction demonstrated no abnormalities.  The pylorus was patent and easily traversed.   DUODENUM:  The bulb and the second portion appeared normal.   THERAPEUTIC/DIAGNOSTIC MANEUVERS:  None.   The patient tolerated the procedure well and was reacted in endoscopy.   IMPRESSION:  Normal esophagus, stomach, D-1, D2.  Today's examination  does  not explain the patient's symptoms nor his Hemoccult-positive stool.   RECOMMENDATIONS:  1. Proceed with a gallbladder ultrasound later in the week.  2. He will need a colonoscopy as well to further evaluate Hemoccult-positive     stool in the near future.      ___________________________________________                                            Jonathon Bellows, M.D.   RMR/MEDQ  D:  05/21/2003  T:  05/21/2003  Job:  [id number]   cc:   Tesfaye D. Felecia Shelling, M.D.  [address]  [place]  [place] [address]  Fax: [phone]

## 2010-10-30 NOTE — Procedures (Signed)
   NAME:  Robert Durham, Robert Durham                         ACCOUNT NO.:  [id number]   MEDICAL RECORD NO.:  [id number]                   PATIENT TYPE:  EMS   LOCATION:  ED                                   FACILITY:  [facility]   PHYSICIAN:  Edward L. Juanetta Gosling, M.D.             DATE OF BIRTH:  1955/07/28   DATE OF PROCEDURE:  DATE OF DISCHARGE:  07/28/2002                                EKG INTERPRETATION   Rhythm is sinus rhythm with rate of about 80. There is possible left atrial  enlargement. Otherwise normal electrocardiogram.                                               Edward L. Juanetta Gosling, M.D.    ELH/MEDQ  D:  07/30/2002  T:  07/30/2002  Job:  [id number]

## 2010-10-30 NOTE — H&P (Signed)
NAME:  Robert Durham, Robert Durham                         ACCOUNT NO.:  [id number]   MEDICAL RECORD NO.:  [id number]                   PATIENT TYPE:  AMB   LOCATION:  DAY                                  FACILITY:  [facility]   PHYSICIAN:  Dennie Maizes, M.D.                DATE OF BIRTH:  [birth year]   DATE OF ADMISSION:  02/07/2003  DATE OF DISCHARGE:                                HISTORY & PHYSICAL   CHIEF COMPLAINT:  Swelling of the left hemiscrotum.   HISTORY OF PRESENT ILLNESS:  This 55 year old male is referred to me by Dr.  Loleta Chance for evaluation and management of a left hemiscrotal swelling. The  patient has noticed left hemiscrotal swelling for several months. The  swelling has been increasing in size. He also complains of lower abdominal  discomfort and pressure. He denied having any voiding difficulty. He has a  good urinary stream, urinary frequency x4-5 and nocturia x2. There is no  history of flank pain, fever, chills, hematuria, or dysuria. There is no  recent history of epididymitis or urethral discharge. The patient has a  history of end-stage renal failure. He has undergone renal transplantation  about ten years ago at [facility] and has been doing  well so far.   PAST MEDICAL HISTORY:  End-stage renal failure, status post renal  transplantation in 1994. History of hypertension, depression, and  hyperlipidemia.   MEDICATIONS:  1. Lipitor 10 mg, one p.o. daily.  2. Effexor XR 75 mg, one p.o. daily.  3. Norvasc 10 mg, one p.o. daily.  4. Sandimmune 125 mg p.o. daily.  5. Prednisone 5 mg, one p.o. daily.  6. Prilosec 20 mg, one p.o. daily.  7. Phenergan 25 mg, one p.o. q.8 h. p.r.n. nausea.   ALLERGIES:  CODEINE.   FAMILY HISTORY:  Unremarkable.   PHYSICAL EXAMINATION:  VITAL SIGNS:  Temperature 98.1 degrees Fahrenheit,  blood pressure 110/70,  height 5 feet 11 inches, weight 185 pounds.  HEENT:  Normal.  NECK:  No masses.  LUNGS:  Clear to  auscultation.  HEART:  Regular rate and rhythm. No murmurs.  ABDOMEN:  Soft. No palpable flank mass. No CVA tenderness. The transplanted  kidney was palpable in the left lower quadrant of the abdomen.  GENITALIA:  Penis normal. Right testis and epididymis are normal. There is  moderate cystic swelling of the left hemiscrotum, suggestive of a left  hydrocele.  RECTAL:  Reveals a 25 g size benign prostate.   IMPRESSION:  Symptomatic left hydrocele.   PLAN:  I have discussed with the patient regarding the diagnosis and  management options. He has decided to undergo surgery. He is scheduled to  undergo left hydrocelectomy under anesthesia in the hospital. I have  discussed with him regarding the diagnosis, operative details, alternate  treatments, outcome, possible risks and complications and he has agreed to  undergo the procedure.  Dennie Maizes, M.D.    SK/MEDQ  D:  02/06/2003  T:  02/06/2003  Job:  [id number]   cc:   Annia Friendly. Loleta Chance, M.D.  [address]  [place]  [place] [address]  Fax: [phone]

## 2010-10-30 NOTE — H&P (Signed)
NAME:  Robert Durham, Robert Durham                           ACCOUNT NO.:  [id number]   MEDICAL RECORD NO.:  [id number]                  PATIENT TYPE:   LOCATION:                                       FACILITY:   PHYSICIAN:  R. Roetta Sessions, M.D.              DATE OF BIRTH:  Oct 18, 1955   DATE OF ADMISSION:  DATE OF DISCHARGE:                                HISTORY & PHYSICAL   CHIEF COMPLAINT:  Nausea, vomiting, and epigastric pain.   HISTORY OF PRESENT ILLNESS:  [name] is a 55 year old African-  American male who initially sought treatment in our office on April 10, 2003.  At that point in time, he had presented with severe nausea and  vomiting once or twice a day for more than five years' duration.  Reportedly, he had underwent EGD at [facility] in 2002; however, he stated  the report was normal and denied any history of peptic ulcer disease.  Over  the last several weeks, this nausea has become more persistent and severe.  He reports that he is constantly nauseated, vomiting two to three times a  day.  Most recently, he reports he was seen in the emergency room on  May 09, 2003, and treated for dehydration.  He was scheduled for office  visit with [place] on May 09, 2003; however, he did not show up for this  appointment.  He denies any aspirin or NSAID use.  Today upon questioning,  he denies any alcohol use; however, at our last visit, he stated he consumed  five to six drinks of beer or liquor per night.  Today, he states this must  have been an error as he has not consumed any alcohol in the last several  months.  He is currently taking Nexium 40 mg daily for his GERD; however, he  continues to complain of intermittent epigastric pain.  He has had  significant weight loss of 9 pounds in the last month or so.  He reports his  bowel movements are now every two to three days with a small amount of  hard  stool.  He denies any blood per rectum or melena.  He underwent  gastric  emptying study on April 12, 2003, which was normal.  We have made multiple  attempts to request his records from [facility] regarding previous  EGD; however, this has not been successful.   More recently, laboratory studies were obtained on his November 25 visit.  At that point in time, hemoglobin and hematocrit were stable at 14.6 and  45.3.  Other labs included WBC 9.7, platelets 185.  Sodium 137, potassium  3.8, chloride 102, CO2 30, glucose 90, BUN 11, creatinine 1.3, calcium 8.9.  Total bilirubin 0.8, direct bilirubin 0.2, indirect bilirubin 0.6, alkaline  phosphatase 62, SGOT 24, SGPT 16, total protein 7.1, albumin 3.4.  Urinalysis was negative.  Since last visit, he has  also had TSH, amylase,  and lipase drawn, all of which were negative as well.   PAST MEDICAL HISTORY:  1. Depression.  2. 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  3. Hypercholesterolemia.  4. Hypertension.  5. Chronic abdominal pain.  6. Erectile dysfunction.   PAST SURGICAL HISTORY:  1. Left kidney transplant in 1994 secondary to chronic glomerulonephritis.  2. Right hand surgery.   CURRENT MEDICATIONS:  1. Sandimmune 125 mg b.i.d.  2. Lipitor 20 mg daily.  3. Norvasc 10 mg daily.  4. Prednisone 5 mg two tablets daily.  5. Xanax 0.5 mg b.i.d.  6. Zofran 4 mg OTD q.6.h. p.r.n.  7. Nexium 40 mg daily.  8. He was on Effexor 75 mg daily; however, he has discontinued this.   ALLERGIES:  CODEINE (nausea and vomiting).   FAMILY HISTORY:  No known family history of cholera, depression, emboli, or  chronic GI problems.  Mother is deceased due to complications from  hypertension.  Father is alive in his 46s with hypertension.  He has two  sisters and one brother with history of hypertension.   SOCIAL HISTORY:  [name] is single without any children.  He is currently  disabled.  He does report a several year history of smoking; however, he  quit approximately 10 years  ago.  Today upon exam, he denies any alcohol  consumption; however, one month ago, he reported five to six drinks of beer  or liquor per night.  He denies any current drug use; however, he has smoked  marijuana in the past.  He denies any IV drug use.   REVIEW OF SYSTEMS:  CONSTITUTIONAL: See HPI.  He reports appetite is  decreased.  He denies any fever or chills.  SKIN: He denies any rash or  jaundice.  CARDIOVASCULAR: He reports occasional palpitations, denies any  chest pain.  GI: See HPI.  GU: He denies any dysuria, hematuria, and urinary  frequency.  He is followed by Tesfaye D. Felecia Shelling, M.D., for his left renal  transplant as well as [facility].   PHYSICAL EXAMINATION:  VITAL SIGNS:  Weight 178 pounds, blood pressure  100/78, pulse 76.  GENERAL:  [name] is a 55 year old, thin African-American male in  no acute distress.  He is alert, oriented, and cooperative.  NEUROLOGIC:  He does appear to have generalized muscular tremors today.  Cranial nerves II-XII are intact.  HEENT:  Oropharynx: Pink and moist without any lesions.  Sclerae with slight  icterus.  Conjunctivae: Pink.  NECK:  Supple without any mass or thyromegaly.  HEART:  Regular rate and rhythm without murmurs, rubs, or gallops.  LUNGS:  Clear to auscultation bilaterally.  ABDOMEN:  Flat with positive bowel sounds x 4.  There is a well healed scar  to the left lower quadrant from previous renal transplant.  The left side of  the abdomen is fuller than right; however, it is soft, nontender,  nondistended without palpable organomegaly or mass.  EXTREMITIES:  Pedal pulses 2+ bilaterally, no edema.  SKIN:  Warm and dry without any rash or jaundice except for scleral icterus.  RECTAL:  No external hemorrhoids visualized.  Good sphincter tone.  Small  amount of light brown stool was obtained, which was hemoccult positive.   LABORATORY DATA:  See HPI.  ASSESSMENT:  [name]  is a 55 year old African-American male with  persistent worsening nausea as well as intermittent epigastric pain and  occasional vomiting.  Colon:  Given [name]' history of hemoccult positive  stools today, this is very suspicious for peptic ulcer disease.  He denies  any aspirin or NSAID use; however, there is questionable history as far as  alcohol consumption.  I have recommended EGD to be done as soon as possible  by R. Roetta Sessions, M.D.  I have discussed risks and benefits with Mr.  Robert Durham including but not limited to bleeding, perforation, and infection as  well as medication reaction.  He agrees with this plan and consent will be  obtained.  He is to continue Nexium on a daily basis and I will refill his  prescription today.  Also, would consider colonoscopy if EGD is negative due  to heme positive stools.   RECOMMENDATIONS:  1. EGD as soon as possible with R. Roetta Sessions, M.D.  Consent to be     obtained.  2. Continue Nexium 40 mg daily.  3. Further recommendations following EGD by Dr. Jena Gauss.     _____________________________________  ___________________________________________  Nicholas Lose, N.P.               Jonathon Bellows, M.D.   KC/MEDQ  D:  05/15/2003  T:  05/15/2003  Job:  [id number]   cc:   Tesfaye D. Felecia Shelling, M.D.  [address]  [place]  [place] [address]  Fax: [phone]   R. Roetta Sessions, M.D.  [address]  [place]  [place] [address]  Fax: [phone]

## 2010-11-07 ENCOUNTER — Emergency Department (HOSPITAL_COMMUNITY)
Admission: EM | Admit: 2010-11-07 | Discharge: 2010-11-07 | Disposition: A | Discharge status: Home / Self Care | Payer: Medicare Other | Attending: Emergency Medicine | Admitting: Emergency Medicine

## 2010-11-07 DIAGNOSIS — K219 Gastro-esophageal reflux disease without esophagitis: Secondary | ICD-10-CM | POA: Insufficient documentation

## 2010-11-07 DIAGNOSIS — K5730 Diverticulosis of large intestine without perforation or abscess without bleeding: Secondary | ICD-10-CM | POA: Insufficient documentation

## 2010-11-07 DIAGNOSIS — I10 Essential (primary) hypertension: Secondary | ICD-10-CM | POA: Insufficient documentation

## 2010-11-07 DIAGNOSIS — IMO0002 Reserved for concepts with insufficient information to code with codable children: Secondary | ICD-10-CM | POA: Insufficient documentation

## 2010-11-07 DIAGNOSIS — E7800 Pure hypercholesterolemia, unspecified: Secondary | ICD-10-CM | POA: Insufficient documentation

## 2010-11-07 DIAGNOSIS — R112 Nausea with vomiting, unspecified: Secondary | ICD-10-CM | POA: Insufficient documentation

## 2010-11-07 DIAGNOSIS — Z79899 Other long term (current) drug therapy: Secondary | ICD-10-CM | POA: Insufficient documentation

## 2010-11-07 DIAGNOSIS — R197 Diarrhea, unspecified: Secondary | ICD-10-CM | POA: Insufficient documentation

## 2010-11-07 DIAGNOSIS — Z940 Kidney transplant status: Secondary | ICD-10-CM | POA: Insufficient documentation

## 2010-11-07 LAB — DIFFERENTIAL
Basophils Absolute: 0 10*3/uL (ref 0.0–0.1)
Eosinophils Absolute: 0.2 10*3/uL (ref 0.0–0.7)
Lymphs Abs: 1.2 10*3/uL (ref 0.7–4.0)
Neutro Abs: 9.2 10*3/uL — ABNORMAL HIGH (ref 1.7–7.7)

## 2010-11-07 LAB — HEPATIC FUNCTION PANEL
ALT: 20 U/L (ref 0–53)
Alkaline Phosphatase: 78 U/L (ref 39–117)
Bilirubin, Direct: 0.1 mg/dL (ref 0.0–0.3)
Indirect Bilirubin: 0.5 mg/dL (ref 0.3–0.9)
Total Bilirubin: 0.6 mg/dL (ref 0.3–1.2)

## 2010-11-07 LAB — URINALYSIS, ROUTINE W REFLEX MICROSCOPIC
Bilirubin Urine: NEGATIVE
Glucose, UA: NEGATIVE mg/dL
Ketones, ur: NEGATIVE mg/dL
Protein, ur: NEGATIVE mg/dL

## 2010-11-07 LAB — CBC
MCH: 22 pg — ABNORMAL LOW (ref 26.0–34.0)
MCV: 69 fL — ABNORMAL LOW (ref 78.0–100.0)
Platelets: 205 10*3/uL (ref 150–400)
RBC: 6.13 MIL/uL — ABNORMAL HIGH (ref 4.22–5.81)
RDW: 15.5 % (ref 11.5–15.5)

## 2010-11-07 LAB — BASIC METABOLIC PANEL
BUN: 12 mg/dL (ref 6–23)
Chloride: 101 mEq/L (ref 96–112)
Creatinine, Ser: 1.11 mg/dL (ref 0.4–1.5)

## 2010-11-08 ENCOUNTER — Inpatient Hospital Stay (HOSPITAL_COMMUNITY)
Admission: EM | Admit: 2010-11-08 | Discharge: 2010-11-11 | DRG: 392 | Disposition: A | Discharge status: Home / Self Care | Payer: Medicare Other | Attending: Internal Medicine | Admitting: Internal Medicine

## 2010-11-08 ENCOUNTER — Inpatient Hospital Stay (HOSPITAL_COMMUNITY): Payer: Medicare Other

## 2010-11-08 ENCOUNTER — Emergency Department (HOSPITAL_COMMUNITY)
Admission: EM | Admit: 2010-11-08 | Discharge: 2010-11-08 | Disposition: A | Discharge status: Home / Self Care | Payer: Medicare Other | Attending: Emergency Medicine | Admitting: Emergency Medicine

## 2010-11-08 ENCOUNTER — Emergency Department (HOSPITAL_COMMUNITY): Payer: Medicare Other

## 2010-11-08 DIAGNOSIS — Z940 Kidney transplant status: Secondary | ICD-10-CM

## 2010-11-08 DIAGNOSIS — K5732 Diverticulitis of large intestine without perforation or abscess without bleeding: Principal | ICD-10-CM | POA: Diagnosis present

## 2010-11-08 DIAGNOSIS — IMO0002 Reserved for concepts with insufficient information to code with codable children: Secondary | ICD-10-CM

## 2010-11-08 DIAGNOSIS — I10 Essential (primary) hypertension: Secondary | ICD-10-CM | POA: Diagnosis present

## 2010-11-08 DIAGNOSIS — A0472 Enterocolitis due to Clostridium difficile, not specified as recurrent: Secondary | ICD-10-CM | POA: Diagnosis present

## 2010-11-08 DIAGNOSIS — R197 Diarrhea, unspecified: Secondary | ICD-10-CM | POA: Insufficient documentation

## 2010-11-08 DIAGNOSIS — A498 Other bacterial infections of unspecified site: Secondary | ICD-10-CM | POA: Diagnosis present

## 2010-11-08 DIAGNOSIS — E785 Hyperlipidemia, unspecified: Secondary | ICD-10-CM | POA: Diagnosis present

## 2010-11-08 DIAGNOSIS — N390 Urinary tract infection, site not specified: Secondary | ICD-10-CM | POA: Diagnosis present

## 2010-11-08 DIAGNOSIS — R112 Nausea with vomiting, unspecified: Secondary | ICD-10-CM | POA: Insufficient documentation

## 2010-11-08 LAB — LACTIC ACID, PLASMA: Lactic Acid, Venous: 3.1 mmol/L — ABNORMAL HIGH (ref 0.5–2.2)

## 2010-11-08 LAB — URINALYSIS, ROUTINE W REFLEX MICROSCOPIC
Glucose, UA: NEGATIVE mg/dL
Ketones, ur: >80 mg/dL — AB
Leukocytes, UA: NEGATIVE
Nitrite: NEGATIVE
Protein, ur: NEGATIVE mg/dL
pH: 6 (ref 5.0–8.0)

## 2010-11-08 LAB — DIFFERENTIAL
Basophils Absolute: 0 10*3/uL (ref 0.0–0.1)
Basophils Relative: 0 % (ref 0–1)
Eosinophils Absolute: 0.2 10*3/uL (ref 0.0–0.7)
Eosinophils Relative: 1 % (ref 0–5)
Monocytes Absolute: 1.2 10*3/uL — ABNORMAL HIGH (ref 0.1–1.0)

## 2010-11-08 LAB — CBC
MCH: 22.6 pg — ABNORMAL LOW (ref 26.0–34.0)
MCHC: 32.9 g/dL (ref 30.0–36.0)
RDW: 15.4 % (ref 11.5–15.5)

## 2010-11-08 LAB — BASIC METABOLIC PANEL
CO2: 25 mEq/L (ref 19–32)
Calcium: 10.1 mg/dL (ref 8.4–10.5)
Chloride: 95 mEq/L — ABNORMAL LOW (ref 96–112)
GFR calc Af Amer: >60 mL/min (ref >60)
Sodium: 131 mEq/L — ABNORMAL LOW (ref 135–145)

## 2010-11-08 LAB — HEPATIC FUNCTION PANEL
Albumin: 3.7 g/dL (ref 3.5–5.2)
Indirect Bilirubin: 0.5 mg/dL (ref 0.3–0.9)
Total Protein: 8.2 g/dL (ref 6.0–8.3)

## 2010-11-08 LAB — URINE MICROSCOPIC-ADD ON

## 2010-11-08 LAB — LIPASE, BLOOD: Lipase: 33 U/L (ref 11–59)

## 2010-11-09 ENCOUNTER — Inpatient Hospital Stay (HOSPITAL_COMMUNITY): Payer: Medicare Other

## 2010-11-09 LAB — BASIC METABOLIC PANEL
BUN: 13 mg/dL (ref 6–23)
Calcium: 10.1 mg/dL (ref 8.4–10.5)
Creatinine, Ser: 1.15 mg/dL (ref 0.4–1.5)
GFR calc non Af Amer: >60 mL/min (ref >60)
Glucose, Bld: 96 mg/dL (ref 70–99)

## 2010-11-09 LAB — DIFFERENTIAL
Eosinophils Absolute: 0 10*3/uL (ref 0.0–0.7)
Eosinophils Relative: 0 % (ref 0–5)
Lymphocytes Relative: 8 % — ABNORMAL LOW (ref 12–46)
Lymphs Abs: 0.8 10*3/uL (ref 0.7–4.0)
Monocytes Absolute: 0.3 10*3/uL (ref 0.1–1.0)

## 2010-11-09 LAB — CBC
HCT: 41.7 % (ref 39.0–52.0)
MCH: 21.8 pg — ABNORMAL LOW (ref 26.0–34.0)
MCHC: 31.7 g/dL (ref 30.0–36.0)
MCV: 68.9 fL — ABNORMAL LOW (ref 78.0–100.0)
Platelets: 193 10*3/uL (ref 150–400)
RDW: 15.6 % — ABNORMAL HIGH (ref 11.5–15.5)
WBC: 10.4 10*3/uL (ref 4.0–10.5)

## 2010-11-09 LAB — RAPID URINE DRUG SCREEN, HOSP PERFORMED
Amphetamines: NOT DETECTED
Barbiturates: NOT DETECTED
Tetrahydrocannabinol: NOT DETECTED

## 2010-11-10 LAB — BASIC METABOLIC PANEL
GFR calc Af Amer: >60 mL/min (ref >60)
GFR calc non Af Amer: >60 mL/min (ref >60)
Potassium: 3.4 mEq/L — ABNORMAL LOW (ref 3.5–5.1)
Sodium: 135 mEq/L (ref 135–145)

## 2010-11-10 LAB — CLOSTRIDIUM DIFFICILE BY PCR

## 2010-11-10 LAB — URINE CULTURE
Colony Count: 75000
Culture  Setup Time: 201205272012

## 2010-11-10 NOTE — H&P (Signed)
Robert Durham, Robert Durham               ACCOUNT NO.:  [id number]  MEDICAL RECORD NO.:  [id number]           PATIENT TYPE:  I  LOCATION:  A334                          FACILITY:  [facility]  PHYSICIAN:  Jeoffrey Massed, MD    DATE OF BIRTH:  06-27-1955  DATE OF ADMISSION:  11/08/2010 DATE OF DISCHARGE:  LH                             HISTORY & PHYSICAL   PRIMARY CARE PRACTITIONER:  Annia Friendly. Loleta Chance, MD at the [facility].  PRIMARY NEPHROLOGIST:  Dr. Suann Larry at [facility].  CHIEF COMPLAINT:  Nausea, vomiting, diarrhea, and abdominal pain for 3 days.  HISTORY OF PRESENT ILLNESS:  The patient is a 55 year old male who has a past medical history of renal transplant in 1994, maintained on immunosuppressive agents, history of hypertension, depression, came to the [facility] Room with the above-noted complaints.  Per the patient over the past 2 to 3 years, he has been having numerous episodes of nausea, vomiting, diarrhea, and abdominal pain on intermittent basis. He apparently claims that he has had a recent EGD and colonoscopy in the past 3-4 months at [facility], which was negative.  Over the past 3 months, the patient has had again intermittent nausea, vomiting, diarrhea.  However, over the past 3 days, he has been having severe abdominal pain in his lower abdomen.  This is associated with numerous episodes of vomiting and diarrhea.  The patient claims that whatever he eats, it either comes up in his vomitus or in his stools.  The patient claims that because of vomiting, he had not been able to take any of his immunosuppressive medications.  He was here in the emergency room yesterday morning, was given supportive care and discharged.  He came back last night, was again given supportive care and discharged, and then subsequently came back again today because of persistent pain.  He denies any fever, but feels sweaty and at times feverish.  He denies any headache or  photophobia.  He denies any neck pain.  He denies any chest pain or shortness of breath.  He denies any dysuria or hematuria.  He denies any skin rash.  Initially upon my evaluation, A CT scan of the abdomen was not done upon talking with the patient and reviewing the patient again with the ED physician, CT scan of his abdomen and pelvis was ordered by me and this has now come back and it shows that the patient has sigmoid diverticulitis without evidence of abscess or perforation.  The patient is now being admitted to the hospitalist service for further evaluation and treatment.  ALLERGIES:  APPARENTLY, THE PATIENT IS ALLERGIC TO VASOTEC AND CODEINE.  PAST MEDICAL HISTORY: 1. Hypertension. 2. History of hypertensive nephropathy, previously was on dialysis for     2 years before he got a renal transplant in 1994.  The patient has     been maintained on immunosuppressive agents. 3. The patient claims to have a longstanding history of recurrent     nausea, vomiting, abdominal pain of unknown etiology.  He claims he     has had numerous workup in [facility] that  has been     inconclusive. 4. Dyslipidemia. 5. Gastroesophageal reflux disease. 6. Hypertension.  PAST SURGICAL HISTORY: 1. AV fistula placement. 2. Left kidney transplant.  FAMILY HISTORY:  Sister has hypertension.  There is no other family members who has history of renal disease.  SOCIAL HISTORY:  The patient lives with his girlfriend.  He is a social smoker, but denies any toxic habits.  REVIEW OF SYSTEMS:  A detailed review of 12 systems were done and these are negative except for the ones noted in the HPI.  PHYSICAL EXAM:  VITAL SIGNS:  Initial vital signs showed temperature of 98.8, heart rate of 103, respiration of 20, blood pressure of 131/103, pulse oximetry of 100% on room air. GENERAL:  On my initial evaluation, the patient appeared in mild distress because of pain, but otherwise was okay and easily  able to participate in the history taking process. NECK:.  The patient is awake and alert, answers all my questions appropriately.  Oral mucosa appears slightly on the dry side. CHEST:  Bilaterally clear to auscultation. CARDIOVASCULAR:  Heart sounds are regular, slightly on the tachycardic side. ABDOMEN:  Soft, bowel sounds present.  The patient had extensive tenderness on his lower abdomen with voluntary guarding. EXTREMITIES:  No edema. NEUROLOGY:  The patient was alert and awake and there are no focal neurological deficits.  LABORATORY DATA: 1. CBC showed WBC of 15.2, hemoglobin of 13.9, hematocrit of 42.2 and     platelet count of 184. 2. Liver function test shows a total bilirubin of 0.6, indirect     bilirubin of 0.1, total indirect bilirubin of 0.5, alkaline     phosphatase of 73, AST of 23, ALT of 14, a total protein of 8.2 and     albumin of 3.7. 3. Chemistry shows sodium of 131, potassium of 3.6, chloride of 95,     bicarb of 25, glucose of 89, calcium of 10.1, BUN of 10, creatinine     of 1.18. 4. Lipase is 33. 5. Urinalysis showed around 3 to 6 wbc's.  RADIOLOGICAL STUDIES: 1. Acute abdominal series showed no evidence of obstruction or free     air.  No evidence of acute cardiopulmonary disease as well. 2. CT of the abdomen and pelvis without contrast showed sigmoid     diverticulitis without evidence of abscess or perforation.     Unremarkable transplant kidney in the left iliac fossa.  ASSESSMENT: 1. Acute sigmoid diverticulitis. 2. Leukocytosis likely secondary to above or from his chronic steroid     use, however, the patient has not been taking steroids because of     intractable vomiting for the past few days. 3. History of left renal transplant maintained on cyclosporin and     prednisone. 4. History of hypertension, currently stable. 5. History of dyslipidemia. 6. Apparent long history of nausea, vomiting, diarrhea, and abdominal     pain of unknown  etiology per the patient.  PLAN: 1. The patient will be admitted to regular floor. 2. He will be hydrated with IV fluids. 3. He will be empirically started on ciprofloxacin and Flagyl. 4. He will be kept n.p.o. overnight.  He will be provided with     antiemetics on a p.r.n. basis. 5. He will be given narcotics for pain. 6. Further plan will depend as the patient's clinical course evolves. 7. He will be given hydrocortisone stress dose for now, with plans to     transition to prednisone as clinical improvement occurs. 8.  Once he is able to take oral medications, he will be resumed on     cyclosporin and Savella. 9. DVT prophylaxis with Lovenox. 10.This patient will be monitored and followed closely. 11.Code status, full code.  Total time spent 60 minutes.     Jeoffrey Massed, MD     SG/MEDQ  D:  11/08/2010  T:  11/08/2010  Job:  [id number]  cc:   Annia Friendly. Loleta Chance, MD Fax: [phone]  Dr. Suann Larry  Electronically Signed by Jeoffrey Massed  on 11/10/2010 06:31:42 PM

## 2010-11-11 LAB — DIFFERENTIAL
Basophils Absolute: 0 10*3/uL (ref 0.0–0.1)
Lymphocytes Relative: 21 % (ref 12–46)
Lymphs Abs: 1.7 10*3/uL (ref 0.7–4.0)
Neutrophils Relative %: 65 % (ref 43–77)

## 2010-11-11 LAB — CBC
HCT: 37.1 % — ABNORMAL LOW (ref 39.0–52.0)
Hemoglobin: 12.1 g/dL — ABNORMAL LOW (ref 13.0–17.0)
RDW: 15.3 % (ref 11.5–15.5)
WBC: 8 10*3/uL (ref 4.0–10.5)

## 2010-11-11 LAB — BASIC METABOLIC PANEL
Calcium: 9.3 mg/dL (ref 8.4–10.5)
GFR calc Af Amer: >60 mL/min (ref >60)
GFR calc non Af Amer: >60 mL/min (ref >60)
Potassium: 2.9 mEq/L — ABNORMAL LOW (ref 3.5–5.1)
Sodium: 139 mEq/L (ref 135–145)

## 2010-11-11 NOTE — Discharge Summary (Signed)
NAMEARSH, FEUTZ               ACCOUNT NO.:  [id number]  MEDICAL RECORD NO.:  [id number]           PATIENT TYPE:  I  LOCATION:  A334                          FACILITY:  [facility]  PHYSICIAN:  Wilson Singer, M.D.DATE OF BIRTH:  [birth year]  DATE OF ADMISSION:  11/08/2010 DATE OF DISCHARGE:  05/30/2012LH                              DISCHARGE SUMMARY   PRIMARY CARE PHYSICIAN:  Annia Friendly. Hill, MD  FINAL DISCHARGE DIAGNOSES: 1. Clostridium difficile sigmoid colitis. 2. Status post left renal transplant on immunosuppressive therapy. 3. Hypertension. 4. Hyperlipidemia. 5. Escherichia coli urinary tract infection.  CONDITION ON DISCHARGE:  Stable.  MEDICATIONS ON DISCHARGE:  Metronidazole 500 mg t.i.d. for further 10 days.  Continue home medications including: 1. Amlodipine 2.5 mg daily. 2. Lipitor 10 mg nightly. 3. Nexium 40 mg b.i.d. 4. Prednisone 5 mg daily. 5. Cyclosporin 125 mg b.i.d. 6. Savella 50  mg b.i.d.  HISTORY:  This is a very pleasant 55 year old man who was admitted with several month history of nausea, vomiting and diarrhea and on this occasion also abdominal pain for 3 days.  Please see initial history and physical examination done by Dr. Jerral Ralph.  HOSPITAL PROGRESS:  The patient was treated conservatively with intravenous fluids and also had a CT scan of his abdomen done.  The CT scan showed significant diverticulitis without evidence of abscess or perforation.  He was therefore appropriately put on intravenous ciprofloxacin and intravenous metronidazole.  He seemed to improve with this.  However, yesterday diarrhea was a prominent feature of his illness and stool was sent for C diff by PCR.  The C diff by PCR was actually positive.  Today, he feels extremely well, and he has not had a diarrheal stool.  In fact, he has had a normal stool, and he has eaten breakfast without any problems and his abdominal pain is insignificant.  PHYSICAL EXAMINATION:   VITAL SIGNS:  Temperature 97.8, blood pressure 125/85, pulse 78, saturation 100% on room air. GENERAL:  He looks systemically well.  He does not look toxic. HEART:  Sounds are present and normal. LUNG:  Fields are entirely clear. ABDOMEN:  Soft and nontender and bowel sounds are heard.  INVESTIGATIONS:  Today shows a hemoglobin of 12.1, white blood cell count 8.0, platelets 208.  Sodium 139, potassium 2.9, bicarbonate 26, BUN 7, creatinine 0.92.  During his hospitalization, urine was also sent for culture and this grew 75,000 colonies of E-coli.  He was already placed on intravenous ciprofloxacin and the E-coli was sensitive to ciprofloxacin.  He has already had 3 days of intravenous ciprofloxacin now.  I do not feel he requires any further antibiotic for his UTI.  DISPOSITION:  The patient is stable to be discharged home.  We will replete his potassium prior to his discharge, and I have given him a prescription for 10 further days of metronidazole 500 mg 3 times a day. He will follow up with his primary care physician, which he already has an appointment in the next few weeks and also with [facility] with whom he also has an appointment coming up.     Chisom Aust  Normajean Glasgow, M.D.     NCG/MEDQ  D:  11/11/2010  T:  11/11/2010  Job:  [id number]  cc:   Annia Friendly. Loleta Chance, MD Fax: [phone]  Suann Larry  Electronically Signed by Lilly Cove M.D. on 11/11/2010 12:31:19 PM

## 2011-02-10 ENCOUNTER — Emergency Department (HOSPITAL_COMMUNITY)
Admission: EM | Admit: 2011-02-10 | Discharge: 2011-02-10 | Disposition: A | Discharge status: Home / Self Care | Payer: Medicare Other | Attending: Emergency Medicine | Admitting: Emergency Medicine

## 2011-02-10 DIAGNOSIS — F329 Major depressive disorder, single episode, unspecified: Secondary | ICD-10-CM | POA: Insufficient documentation

## 2011-02-10 DIAGNOSIS — I10 Essential (primary) hypertension: Secondary | ICD-10-CM | POA: Insufficient documentation

## 2011-02-10 DIAGNOSIS — N289 Disorder of kidney and ureter, unspecified: Secondary | ICD-10-CM | POA: Insufficient documentation

## 2011-02-10 DIAGNOSIS — S90569A Insect bite (nonvenomous), unspecified ankle, initial encounter: Secondary | ICD-10-CM | POA: Insufficient documentation

## 2011-02-10 DIAGNOSIS — Z940 Kidney transplant status: Secondary | ICD-10-CM | POA: Insufficient documentation

## 2011-02-10 DIAGNOSIS — R110 Nausea: Secondary | ICD-10-CM | POA: Insufficient documentation

## 2011-02-10 DIAGNOSIS — W57XXXA Bitten or stung by nonvenomous insect and other nonvenomous arthropods, initial encounter: Secondary | ICD-10-CM

## 2011-02-10 DIAGNOSIS — R51 Headache: Secondary | ICD-10-CM | POA: Insufficient documentation

## 2011-02-10 DIAGNOSIS — F3289 Other specified depressive episodes: Secondary | ICD-10-CM | POA: Insufficient documentation

## 2011-02-10 HISTORY — DX: Major depressive disorder, single episode, unspecified: F32.9

## 2011-02-10 HISTORY — DX: Disorder of kidney and ureter, unspecified: N28.9

## 2011-02-10 HISTORY — DX: Depression, unspecified: F32.A

## 2011-02-10 HISTORY — DX: Essential (primary) hypertension: I10

## 2011-02-10 MED ORDER — HYDROCODONE-ACETAMINOPHEN 5-325 MG PO TABS
2.0000 | ORAL_TABLET | Freq: Once | ORAL | Status: AC
  Administered 2011-02-10: 2 via ORAL
  Filled 2011-02-10: qty 2

## 2011-02-10 MED ORDER — HYDROCODONE-ACETAMINOPHEN 5-325 MG PO TABS: ORAL_TABLET | ORAL | Status: DC

## 2011-02-10 MED ORDER — DOXYCYCLINE HYCLATE 100 MG PO CAPS: 100.0000 mg | ORAL_CAPSULE | Freq: Two times a day (BID) | ORAL | Status: AC

## 2011-02-10 MED ORDER — ONDANSETRON HCL 4 MG PO TABS
4.0000 mg | ORAL_TABLET | Freq: Once | ORAL | Status: AC
  Administered 2011-02-10: 4 mg via ORAL
  Filled 2011-02-10: qty 1

## 2011-02-10 MED ORDER — DOXYCYCLINE HYCLATE 100 MG PO TABS
100.0000 mg | ORAL_TABLET | Freq: Once | ORAL | Status: AC
  Administered 2011-02-10: 100 mg via ORAL
  Filled 2011-02-10: qty 1

## 2011-02-10 NOTE — ED Provider Notes (Signed)
Medical screening examination/treatment/procedure(s) were performed by non-physician practitioner and as supervising physician I was immediately available for consultation/collaboration.   Vida Roller, MD 02/10/11 1256

## 2011-02-10 NOTE — ED Provider Notes (Signed)
History     CSN: [id number] Arrival date & time: 02/10/2011 10:58 AM  Chief Complaint  Patient presents with  . Headache  . Tick Removal   Patient is a 55 y.o. male presenting with headaches. The history is provided by the patient.  Headache  This is a new problem. The current episode started 2 days ago. The problem occurs constantly. The problem has not changed since onset.The headache is associated with bright light. The pain is located in the frontal region. The quality of the pain is described as sharp. The pain is moderate. The pain does not radiate. Associated symptoms include nausea. Pertinent negatives include no anorexia, no fever, no malaise/fatigue, no near-syncope, no palpitations, no syncope, no shortness of breath and no vomiting. He has tried nothing for the symptoms.    Past Medical History  Diagnosis Date  . Renal disorder   . Hypertension   . Depression     Past Surgical History  Procedure Date  . Kidney transplant   . Hernia repair     No family history on file.  History  Substance Use Topics  . Smoking status: Never Smoker   . Smokeless tobacco: Not on file  . Alcohol Use: Yes      Review of Systems  Constitutional: Negative for fever, malaise/fatigue and activity change.       All ROS Neg except as noted in HPI  HENT: Negative for nosebleeds and neck pain.   Eyes: Negative for photophobia and discharge.  Respiratory: Negative for cough, shortness of breath and wheezing.   Cardiovascular: Negative for chest pain, palpitations, syncope and near-syncope.  Gastrointestinal: Positive for nausea. Negative for vomiting, abdominal pain, blood in stool and anorexia.  Genitourinary: Negative for dysuria, frequency and hematuria.  Musculoskeletal: Negative for back pain and arthralgias.  Skin: Negative.   Neurological: Positive for headaches. Negative for dizziness, seizures and speech difficulty.  Psychiatric/Behavioral: Negative for hallucinations and  confusion.    Physical Exam  BP 128/87  Pulse 84  Temp(Src) 98.3 F (36.8 C) (Oral)  Resp 18  Ht 5\' 11"  (1.803 m)  Wt 180 lb (81.647 kg)  BMI 25.10 kg/m2  SpO2 100%  Physical Exam  Nursing note and vitals reviewed. Constitutional: He is oriented to person, place, and time. He appears well-developed and well-nourished.  Non-toxic appearance.  HENT:  Head: Normocephalic.  Right Ear: Tympanic membrane and external ear normal.  Left Ear: Tympanic membrane and external ear normal.  Eyes: EOM and lids are normal. Pupils are equal, round, and reactive to light.  Neck: Normal range of motion. Neck supple. Carotid bruit is not present.  Cardiovascular: Normal rate, regular rhythm, normal heart sounds, intact distal pulses and normal pulses.   Pulmonary/Chest: Breath sounds normal. No respiratory distress.  Abdominal: Soft. Bowel sounds are normal. There is no tenderness. There is no guarding.  Musculoskeletal: Normal range of motion.  Lymphadenopathy:       Head (right side): No submandibular adenopathy present.       Head (left side): No submandibular adenopathy present.    He has no cervical adenopathy.  Neurological: He is alert and oriented to person, place, and time. He has normal strength. No cranial nerve deficit or sensory deficit. Gait normal. GCS eye subscore is 4. GCS verbal subscore is 5. GCS motor subscore is 6.  Skin: Skin is warm and dry.  Psychiatric: He has a normal mood and affect. His speech is normal.    ED Course  Procedures  MDM I have reviewed nursing notes, vital signs, and all appropriate lab and imaging results for this patient. Pt concerned about problems resulting from a recent tick bite. Pt re-assured that his b/p and temp were wnl. Exam is non-acute at this time. He request antibiotics. Doxycycline given.      Kathie Dike, [place] 02/10/11 [phone]

## 2011-02-10 NOTE — ED Notes (Signed)
Pt c/o left ankle itching since Monday. Pt pulled tick off of ankle today. Pt has tick with him at this time. Pt c/o headache since Monday as well. Light sensitive, intermittent periods of nausea. A/o x3. nad noted at this time. Denies fever.

## 2011-03-09 LAB — DIFFERENTIAL
Basophils Absolute: 0
Basophils Relative: 0
Eosinophils Absolute: 0.1
Lymphocytes Relative: 14
Lymphs Abs: 0.9
Monocytes Absolute: 0.5
Neutro Abs: 4.6

## 2011-03-09 LAB — URINALYSIS, ROUTINE W REFLEX MICROSCOPIC
Bilirubin Urine: NEGATIVE
Hgb urine dipstick: NEGATIVE
Ketones, ur: NEGATIVE
Nitrite: NEGATIVE
Protein, ur: NEGATIVE
Specific Gravity, Urine: 1.025
Urobilinogen, UA: 0.2

## 2011-03-09 LAB — COMPREHENSIVE METABOLIC PANEL
ALT: 15
AST: 20
Alkaline Phosphatase: 65
CO2: 26
Calcium: 9.2
Chloride: 100
GFR calc Af Amer: >60
GFR calc non Af Amer: 52 — ABNORMAL LOW
Glucose, Bld: 101 — ABNORMAL HIGH
Sodium: 133 — ABNORMAL LOW
Total Bilirubin: 0.7

## 2011-03-09 LAB — CBC
Hemoglobin: 13.4
MCHC: 32.8
MCV: 68.3 — ABNORMAL LOW
RBC: 5.99 — ABNORMAL HIGH
RDW: 14.8
WBC: 6.1

## 2011-03-11 LAB — CBC
Hemoglobin: 13.1
MCHC: 31.9
MCV: 68.7 — ABNORMAL LOW
RBC: 5.93 — ABNORMAL HIGH
RBC: 5.93 — ABNORMAL HIGH
RDW: 15.9 — ABNORMAL HIGH
WBC: 6.4

## 2011-03-11 LAB — DIFFERENTIAL
Basophils Absolute: 0
Basophils Relative: 0
Basophils Relative: 1
Eosinophils Absolute: 0.2
Eosinophils Absolute: 0.2
Eosinophils Relative: 4
Lymphocytes Relative: 17
Lymphs Abs: 2
Monocytes Relative: 9
Neutrophils Relative %: 71

## 2011-03-11 LAB — COMPREHENSIVE METABOLIC PANEL
ALT: 12
ALT: 16
AST: 19
AST: 20
Alkaline Phosphatase: 50
CO2: 26
CO2: 27
Calcium: 9.4
Calcium: 9.6
Chloride: 104
Creatinine, Ser: 1.55 — ABNORMAL HIGH
GFR calc Af Amer: 57 — ABNORMAL LOW
GFR calc Af Amer: 58 — ABNORMAL LOW
GFR calc non Af Amer: 47 — ABNORMAL LOW
GFR calc non Af Amer: 48 — ABNORMAL LOW
Potassium: 3.8
Sodium: 136
Sodium: 137
Total Bilirubin: 0.9
Total Protein: 7.8

## 2011-03-11 LAB — URINALYSIS, ROUTINE W REFLEX MICROSCOPIC
Nitrite: NEGATIVE
Specific Gravity, Urine: 1.025
Urobilinogen, UA: 2 — ABNORMAL HIGH
pH: 6.5

## 2011-03-11 LAB — LIPASE, BLOOD: Lipase: 30

## 2011-03-16 LAB — BASIC METABOLIC PANEL
CO2: 26
Chloride: 106
GFR calc Af Amer: >60
Potassium: 3.4 — ABNORMAL LOW
Sodium: 137

## 2011-03-16 LAB — URINE CULTURE

## 2011-03-16 LAB — URINE MICROSCOPIC-ADD ON

## 2011-03-16 LAB — CBC
HCT: 38.4 — ABNORMAL LOW
Hemoglobin: 13.2
MCHC: 32
MCHC: 32.6
MCV: 70.2 — ABNORMAL LOW
MCV: 70.8 — ABNORMAL LOW
Platelets: 143 — ABNORMAL LOW
RBC: 5.84 — ABNORMAL HIGH

## 2011-03-16 LAB — COMPREHENSIVE METABOLIC PANEL
AST: 31
Albumin: 3.5
BUN: 14
Calcium: 9
Chloride: 104
Creatinine, Ser: 1.33
GFR calc Af Amer: >60
GFR calc non Af Amer: 56 — ABNORMAL LOW
Total Bilirubin: 0.5

## 2011-03-16 LAB — DIFFERENTIAL
Basophils Absolute: 0
Basophils Relative: 1
Eosinophils Absolute: 0.3
Lymphocytes Relative: 33
Monocytes Absolute: 0.6
Monocytes Absolute: 0.6
Monocytes Relative: 10
Neutro Abs: 3.6
Neutrophils Relative %: 52

## 2011-03-16 LAB — URINALYSIS, ROUTINE W REFLEX MICROSCOPIC
Bilirubin Urine: NEGATIVE
Glucose, UA: NEGATIVE
Glucose, UA: NEGATIVE
Hgb urine dipstick: NEGATIVE
Hgb urine dipstick: NEGATIVE
Protein, ur: NEGATIVE
Specific Gravity, Urine: 1.015
Specific Gravity, Urine: >1.03 — ABNORMAL HIGH
Urobilinogen, UA: 1
pH: 6.5

## 2011-03-16 LAB — LIPASE, BLOOD: Lipase: 27

## 2011-03-16 LAB — PROTIME-INR: INR: 1

## 2011-05-05 ENCOUNTER — Encounter (HOSPITAL_COMMUNITY): Payer: Self-pay | Admitting: Emergency Medicine

## 2011-05-05 ENCOUNTER — Emergency Department (HOSPITAL_COMMUNITY)
Admission: EM | Admit: 2011-05-05 | Discharge: 2011-05-05 | Disposition: A | Discharge status: Home / Self Care | Payer: Medicare Other | Attending: Emergency Medicine | Admitting: Emergency Medicine

## 2011-05-05 DIAGNOSIS — R51 Headache: Secondary | ICD-10-CM | POA: Insufficient documentation

## 2011-05-05 DIAGNOSIS — F3289 Other specified depressive episodes: Secondary | ICD-10-CM | POA: Insufficient documentation

## 2011-05-05 DIAGNOSIS — F329 Major depressive disorder, single episode, unspecified: Secondary | ICD-10-CM | POA: Insufficient documentation

## 2011-05-05 DIAGNOSIS — E860 Dehydration: Secondary | ICD-10-CM | POA: Insufficient documentation

## 2011-05-05 DIAGNOSIS — N289 Disorder of kidney and ureter, unspecified: Secondary | ICD-10-CM | POA: Insufficient documentation

## 2011-05-05 DIAGNOSIS — Z940 Kidney transplant status: Secondary | ICD-10-CM | POA: Insufficient documentation

## 2011-05-05 DIAGNOSIS — R110 Nausea: Secondary | ICD-10-CM | POA: Insufficient documentation

## 2011-05-05 DIAGNOSIS — I10 Essential (primary) hypertension: Secondary | ICD-10-CM | POA: Insufficient documentation

## 2011-05-05 LAB — DIFFERENTIAL
Basophils Absolute: 0 10*3/uL (ref 0.0–0.1)
Basophils Relative: 0 % (ref 0–1)
Eosinophils Absolute: 0.3 10*3/uL (ref 0.0–0.7)
Eosinophils Relative: 4 % (ref 0–5)
Monocytes Absolute: 1.1 10*3/uL — ABNORMAL HIGH (ref 0.1–1.0)

## 2011-05-05 LAB — BASIC METABOLIC PANEL
BUN: 14 mg/dL (ref 6–23)
CO2: 26 mEq/L (ref 19–32)
Calcium: 9.5 mg/dL (ref 8.4–10.5)
Creatinine, Ser: 1.3 mg/dL (ref 0.50–1.35)
Glucose, Bld: 99 mg/dL (ref 70–99)

## 2011-05-05 LAB — URINALYSIS, ROUTINE W REFLEX MICROSCOPIC
Bilirubin Urine: NEGATIVE
Ketones, ur: 15 mg/dL — AB
Leukocytes, UA: NEGATIVE
Nitrite: NEGATIVE
Protein, ur: NEGATIVE mg/dL

## 2011-05-05 LAB — URINE MICROSCOPIC-ADD ON

## 2011-05-05 LAB — CBC
HCT: 43.8 % (ref 39.0–52.0)
MCH: 22.5 pg — ABNORMAL LOW (ref 26.0–34.0)
MCHC: 32.2 g/dL (ref 30.0–36.0)
MCV: 69.9 fL — ABNORMAL LOW (ref 78.0–100.0)
Platelets: 142 10*3/uL — ABNORMAL LOW (ref 150–400)
RDW: 15.1 % (ref 11.5–15.5)
WBC: 7.6 10*3/uL (ref 4.0–10.5)

## 2011-05-05 MED ORDER — ONDANSETRON HCL 4 MG/2ML IJ SOLN
4.0000 mg | Freq: Once | INTRAMUSCULAR | Status: AC
  Administered 2011-05-05: 4 mg via INTRAVENOUS
  Filled 2011-05-05: qty 2

## 2011-05-05 MED ORDER — HYDROMORPHONE HCL PF 1 MG/ML IJ SOLN
1.0000 mg | Freq: Once | INTRAMUSCULAR | Status: AC
  Administered 2011-05-05: 1 mg via INTRAVENOUS
  Filled 2011-05-05: qty 1

## 2011-05-05 MED ORDER — SODIUM CHLORIDE 0.9 % IV BOLUS (SEPSIS)
1000.0000 mL | Freq: Once | INTRAVENOUS | Status: AC
  Administered 2011-05-05: 1000 mL via INTRAVENOUS

## 2011-05-05 MED ORDER — HYDROCODONE-ACETAMINOPHEN 5-325 MG PO TABS: 1.0000 | ORAL_TABLET | ORAL | Status: DC | PRN

## 2011-05-05 MED ORDER — ONDANSETRON HCL 4 MG PO TABS: 4.0000 mg | ORAL_TABLET | Freq: Four times a day (QID) | ORAL | Status: AC

## 2011-05-05 NOTE — ED Provider Notes (Addendum)
History     CSN: [id number] Arrival date & time: 05/05/2011  7:25 AM   First MD Initiated Contact with Patient 05/05/11 [phone]      Chief Complaint  Patient presents with  . Headache  . Generalized Body Aches  . Chills  . Emesis    (Consider location/radiation/quality/duration/timing/severity/associated sxs/prior treatment) HPI.... frontal headache for 3 days status post kidney transplant in 1994 at [facility]. Has not his creatinine checked in several months. Also complains of general body achiness and chills. He has tried nothing at home. Nothing makes it better or worse. No neck stiffness.  Headache is described as sharp. unable to keep fluids down  Past Medical History  Diagnosis Date  . Renal disorder   . Hypertension   . Depression     Past Surgical History  Procedure Date  . Kidney transplant   . Hernia repair     Family History  Problem Relation Age of Onset  . Hypertension Sister     History  Substance Use Topics  . Smoking status: Never Smoker   . Smokeless tobacco: Never Used  . Alcohol Use: 1.2 oz/week    2 Cans of beer per week     occasionally      Review of Systems  All other systems reviewed and are negative.    Allergies  Codeine and Vasotec  Home Medications   Current Outpatient Rx  Name Route Sig Dispense Refill  . AMLODIPINE BESYLATE 2.5 MG PO TABS Oral Take 2.5 mg by mouth daily.      . ATORVASTATIN CALCIUM 10 MG PO TABS Oral Take 10 mg by mouth daily.      . CYCLOSPORINE 100 MG PO CAPS Oral Take 100 mg by mouth 2 (two) times daily.      . CYCLOSPORINE 25 MG PO CAPS Oral Take 25 mg by mouth 2 (two) times daily.      Marland Kitchen ESOMEPRAZOLE MAGNESIUM 40 MG PO CPDR Oral Take 40 mg by mouth 2 (two) times daily.      Marland Kitchen HYDROCODONE-ACETAMINOPHEN 5-325 MG PO TABS  1 0r 2 po q4h prn pain 10 tablet 0  . MILNACIPRAN HCL 50 MG PO TABS Oral Take 50 mg by mouth 2 (two) times daily.      Marland Kitchen PREDNISONE 5 MG PO TABS Oral Take 5 mg by mouth daily.         BP 111/96  Pulse 112  Temp(Src) 98.6 F (37 C) (Oral)  Resp 16  Ht 5\' 11"  (1.803 m)  Wt 180 lb (81.647 kg)  BMI 25.10 kg/m2  SpO2 100%  Physical Exam  Nursing note and vitals reviewed. Constitutional: He is oriented to person, place, and time. He appears well-developed and well-nourished.  HENT:  Head: Normocephalic and atraumatic.  Eyes: Conjunctivae and EOM are normal. Pupils are equal, round, and reactive to light.  Neck: Normal range of motion. Neck supple.  Cardiovascular: Normal rate and regular rhythm.   Pulmonary/Chest: Effort normal and breath sounds normal.  Abdominal: Soft. Bowel sounds are normal.  Musculoskeletal: Normal range of motion.  Neurological: He is alert and oriented to person, place, and time.  Skin: Skin is warm and dry.  Psychiatric: He has a normal mood and affect.    ED Course  Procedures (including critical care time)  Labs Reviewed  CBC - Abnormal; Notable for the following:    RBC 6.27 (*)    MCV 69.9 (*)    MCH 22.5 (*)    Platelets 142 (*)  All other components within normal limits  DIFFERENTIAL - Abnormal; Notable for the following:    Monocytes Relative 15 (*)    Monocytes Absolute 1.1 (*)    All other components within normal limits  BASIC METABOLIC PANEL  URINALYSIS, ROUTINE W REFLEX MICROSCOPIC   No results found.   No diagnosis found.    MDM  Patient appears slightly dehydrated. Will hydrate and prescribed medicine for pain and nausea. Check creatinine and urinalysis.   Recheck at 10:00.  Feeling much better. creatinine normal    [name], MD 05/05/11 1610  [name], MD 05/05/11 1012

## 2011-05-05 NOTE — Discharge Instructions (Signed)
Your kidney test was within normal limits. Medications for pain and nausea. Rest. Increase fluids

## 2011-05-05 NOTE — ED Notes (Signed)
Patient c/o headache, generalized body aches, chills, and vomiting x3 days. Patient denies any diarrhea.

## 2011-05-19 ENCOUNTER — Emergency Department (HOSPITAL_COMMUNITY)
Admission: EM | Admit: 2011-05-19 | Discharge: 2011-05-19 | Disposition: A | Discharge status: Home / Self Care | Payer: Medicare Other | Attending: Emergency Medicine | Admitting: Emergency Medicine

## 2011-05-19 ENCOUNTER — Encounter (HOSPITAL_COMMUNITY): Payer: Self-pay | Admitting: *Deleted

## 2011-05-19 DIAGNOSIS — N289 Disorder of kidney and ureter, unspecified: Secondary | ICD-10-CM | POA: Insufficient documentation

## 2011-05-19 DIAGNOSIS — F329 Major depressive disorder, single episode, unspecified: Secondary | ICD-10-CM | POA: Insufficient documentation

## 2011-05-19 DIAGNOSIS — N419 Inflammatory disease of prostate, unspecified: Secondary | ICD-10-CM | POA: Insufficient documentation

## 2011-05-19 DIAGNOSIS — Z940 Kidney transplant status: Secondary | ICD-10-CM | POA: Insufficient documentation

## 2011-05-19 DIAGNOSIS — R339 Retention of urine, unspecified: Secondary | ICD-10-CM

## 2011-05-19 DIAGNOSIS — I10 Essential (primary) hypertension: Secondary | ICD-10-CM | POA: Insufficient documentation

## 2011-05-19 DIAGNOSIS — F3289 Other specified depressive episodes: Secondary | ICD-10-CM | POA: Insufficient documentation

## 2011-05-19 LAB — CBC
HCT: 46.1 % (ref 39.0–52.0)
Hemoglobin: 15.1 g/dL (ref 13.0–17.0)
MCHC: 32.8 g/dL (ref 30.0–36.0)
MCV: 69.7 fL — ABNORMAL LOW (ref 78.0–100.0)
RDW: 15.7 % — ABNORMAL HIGH (ref 11.5–15.5)

## 2011-05-19 LAB — COMPREHENSIVE METABOLIC PANEL
ALT: 19 U/L (ref 0–53)
Alkaline Phosphatase: 84 U/L (ref 39–117)
BUN: 18 mg/dL (ref 6–23)
CO2: 27 mEq/L (ref 19–32)
GFR calc Af Amer: 69 mL/min — ABNORMAL LOW (ref >90)
GFR calc non Af Amer: 60 mL/min — ABNORMAL LOW (ref >90)
Glucose, Bld: 112 mg/dL — ABNORMAL HIGH (ref 70–99)
Potassium: 4.1 mEq/L (ref 3.5–5.1)
Sodium: 138 mEq/L (ref 135–145)

## 2011-05-19 LAB — URINALYSIS, ROUTINE W REFLEX MICROSCOPIC
Bilirubin Urine: NEGATIVE
Ketones, ur: NEGATIVE mg/dL
Leukocytes, UA: NEGATIVE
Nitrite: NEGATIVE
Protein, ur: NEGATIVE mg/dL
Urobilinogen, UA: 1 mg/dL (ref 0.0–1.0)

## 2011-05-19 LAB — DIFFERENTIAL
Basophils Absolute: 0 10*3/uL (ref 0.0–0.1)
Basophils Relative: 0 % (ref 0–1)
Eosinophils Absolute: 0.1 10*3/uL (ref 0.0–0.7)
Lymphocytes Relative: 15 % (ref 12–46)
Lymphs Abs: 1.4 10*3/uL (ref 0.7–4.0)
Monocytes Relative: 8 % (ref 3–12)
Neutro Abs: 7.2 10*3/uL (ref 1.7–7.7)

## 2011-05-19 MED ORDER — ACETAMINOPHEN 500 MG PO TABS
ORAL_TABLET | ORAL | Status: AC
  Administered 2011-05-19: 1000 mg
  Filled 2011-05-19: qty 2

## 2011-05-19 MED ORDER — SULFAMETHOXAZOLE-TRIMETHOPRIM 800-160 MG PO TABS: 1.0000 | ORAL_TABLET | Freq: Two times a day (BID) | ORAL | Status: DC

## 2011-05-19 NOTE — ED Notes (Signed)
Reports last normal BM 10 days ago; c/o difficulty passing urine x 10 days; was seen at [facility] yesterday for same.

## 2011-05-19 NOTE — ED Provider Notes (Signed)
Scribed for Robert Lennert, MD, the patient was seen in room APA06/APA06 . This chart was scribed by Ellie Lunch.   CSN: [id number] Arrival date & time: 05/19/2011  2:19 PM   First MD Initiated Contact with Patient 05/19/11 1454      Chief Complaint  Patient presents with  . Abdominal Pain  . Constipation  . Dysuria    (Consider location/radiation/quality/duration/timing/severity/associated sxs/prior treatment) Patient is a 55 y.o. male presenting with abdominal pain, constipation, and dysuria. The history is provided by the patient. No language interpreter was used.  Abdominal Pain The primary symptoms of the illness include abdominal pain and dysuria. The primary symptoms of the illness do not include fatigue or diarrhea.  The dysuria is not associated with hematuria or frequency.  Additional symptoms associated with the illness include constipation. Symptoms associated with the illness do not include hematuria, frequency or back pain.  Constipation  Associated symptoms include abdominal pain. Pertinent negatives include no diarrhea, no hematuria, no chest pain, no headaches, no coughing and no rash.  Dysuria  Pertinent negatives include no frequency and no hematuria.   Pt seen at 15:00 Warner Laduca Abood is a 55 y.o. male who presents to the Emergency Department complaining of  10 days of sudden onset difficulty urinating status post right kidney transplant 1994 at [facility].  Difficulty urinating is associated with RUQ abdominal pain. Sx have been constant since onset. Nothing makes Sx better or worse. Pt was seen at [facility] yesterday and reports he had a [place] of his abdomen.    Past Medical History  Diagnosis Date  . Renal disorder   . Hypertension   . Depression     Past Surgical History  Procedure Date  . Kidney transplant   . Hernia repair     Family History  Problem Relation Age of Onset  . Hypertension Sister     History  Substance Use Topics  . Smoking status:  Never Smoker   . Smokeless tobacco: Never Used  . Alcohol Use: 1.2 oz/week    2 Cans of beer per week     occasionally     Review of Systems  Constitutional: Negative for fatigue.  HENT: Negative for congestion, sinus pressure and ear discharge.   Eyes: Negative for discharge.  Respiratory: Negative for cough.   Cardiovascular: Negative for chest pain.  Gastrointestinal: Positive for abdominal pain and constipation. Negative for diarrhea.  Genitourinary: Positive for dysuria and difficulty urinating. Negative for frequency and hematuria.  Musculoskeletal: Negative for back pain.  Skin: Negative for rash.  Neurological: Negative for seizures and headaches.  Hematological: Negative.   Psychiatric/Behavioral: Negative for hallucinations.    Allergies  Codeine and Vasotec  Home Medications   Current Outpatient Rx  Name Route Sig Dispense Refill  . ACETAMINOPHEN 500 MG PO TABS Oral Take 1,000 mg by mouth every 6 (six) hours as needed. For pain     . AMLODIPINE BESYLATE 2.5 MG PO TABS Oral Take 2.5 mg by mouth daily.      . ATORVASTATIN CALCIUM 10 MG PO TABS Oral Take 10 mg by mouth at bedtime.     . CYCLOSPORINE 100 MG PO CAPS Oral Take 100 mg by mouth 2 (two) times daily. Take with 25 mg tablet to equal 125 mg twice daily    . CYCLOSPORINE 25 MG PO CAPS Oral Take 25 mg by mouth 2 (two) times daily. Take with 100 mg tablet to equal dose of 125 mg twice daily    .  ESOMEPRAZOLE MAGNESIUM 40 MG PO CPDR Oral Take 40 mg by mouth 2 (two) times daily.      Marland Kitchen MILNACIPRAN HCL 50 MG PO TABS Oral Take 50 mg by mouth 2 (two) times daily.      Marland Kitchen PREDNISONE 5 MG PO TABS Oral Take 5 mg by mouth daily.      . SULFAMETHOXAZOLE-TRIMETHOPRIM 800-160 MG PO TABS Oral Take 1 tablet by mouth every 12 (twelve) hours. 14 tablet 0    BP 143/95  Pulse 112  Temp(Src) 98.5 F (36.9 C) (Oral)  Resp 20  Ht 5\' 11"  (1.803 m)  Wt 179 lb (81.194 kg)  BMI 24.97 kg/m2  SpO2 100%  Physical Exam    Constitutional: He is oriented to person, place, and time. He appears well-developed.  HENT:  Head: Normocephalic and atraumatic.  Eyes: Conjunctivae and EOM are normal. No scleral icterus.  Neck: Neck supple. No thyromegaly present.  Cardiovascular: Normal rate and regular rhythm.  Exam reveals no gallop and no friction rub.   No murmur heard. Pulmonary/Chest: No stridor. He has no wheezes. He has no rales. He exhibits no tenderness.  Abdominal: Soft. He exhibits no distension. There is tenderness (mild RUQ). There is no rebound.  Genitourinary:       Prostate tender  Musculoskeletal: Normal range of motion. He exhibits no edema.  Lymphadenopathy:    He has no cervical adenopathy.  Neurological: He is oriented to person, place, and time. Coordination normal.  Skin: No rash noted. No erythema.  Psychiatric: He has a normal mood and affect. His behavior is normal.    ED Course  Procedures (including critical care time) DIAGNOSTIC STUDIES: Oxygen Saturation is 100% on room air, normal by my interpretation.    COORDINATION OF CARE: 5:00pm Pt labs discussed. Renal function normal, inflamed prostate, catheter staying in. D/c with antibiotics. Urologist referral. 450 cc residual urine noted.  Labs Reviewed  CBC - Abnormal; Notable for the following:    RBC 6.61 (*)    MCV 69.7 (*)    MCH 22.8 (*)    RDW 15.7 (*)    All other components within normal limits  COMPREHENSIVE METABOLIC PANEL - Abnormal; Notable for the following:    Glucose, Bld 112 (*)    GFR calc non Af Amer 60 (*)    GFR calc Af Amer 69 (*)    All other components within normal limits  DIFFERENTIAL  URINALYSIS, ROUTINE W REFLEX MICROSCOPIC   No results found.   1. Prostatitis   2. Urinary retention       MDM  Urinary retention,  Prostatitis,,  bph  The chart was scribed for me under my direct supervision.  I personally performed the history, physical, and medical decision making and all procedures in  the evaluation of this patient.Robert Lennert, MD 05/19/11 [phone]

## 2011-05-19 NOTE — ED Notes (Signed)
Pt c/o abd pain 10/10-requesting something for pain; EDP Zammit notified; order rec'd for Tylenol 1gram po and instruction to tell pt to get OTC MOM or Mag Citrate for tx of constipation; instructed to f/u with Dr. Jerre Simon for urinary s/s. Verbalizes understanding of instructions given.

## 2011-05-19 NOTE — ED Notes (Signed)
Replaced foley bag with leg-bag per Dr Estell Harpin

## 2011-05-21 ENCOUNTER — Emergency Department (HOSPITAL_COMMUNITY)
Admission: EM | Admit: 2011-05-21 | Discharge: 2011-05-21 | Disposition: A | Discharge status: Home / Self Care | Payer: Medicare Other | Attending: Emergency Medicine | Admitting: Emergency Medicine

## 2011-05-21 ENCOUNTER — Encounter (HOSPITAL_COMMUNITY): Payer: Self-pay | Admitting: *Deleted

## 2011-05-21 DIAGNOSIS — N390 Urinary tract infection, site not specified: Secondary | ICD-10-CM

## 2011-05-21 DIAGNOSIS — Y849 Medical procedure, unspecified as the cause of abnormal reaction of the patient, or of later complication, without mention of misadventure at the time of the procedure: Secondary | ICD-10-CM | POA: Insufficient documentation

## 2011-05-21 DIAGNOSIS — I10 Essential (primary) hypertension: Secondary | ICD-10-CM | POA: Insufficient documentation

## 2011-05-21 DIAGNOSIS — Z466 Encounter for fitting and adjustment of urinary device: Secondary | ICD-10-CM | POA: Insufficient documentation

## 2011-05-21 DIAGNOSIS — F329 Major depressive disorder, single episode, unspecified: Secondary | ICD-10-CM | POA: Insufficient documentation

## 2011-05-21 DIAGNOSIS — F3289 Other specified depressive episodes: Secondary | ICD-10-CM | POA: Insufficient documentation

## 2011-05-21 DIAGNOSIS — R339 Retention of urine, unspecified: Secondary | ICD-10-CM

## 2011-05-21 DIAGNOSIS — Z940 Kidney transplant status: Secondary | ICD-10-CM | POA: Insufficient documentation

## 2011-05-21 DIAGNOSIS — N289 Disorder of kidney and ureter, unspecified: Secondary | ICD-10-CM | POA: Insufficient documentation

## 2011-05-21 LAB — URINALYSIS, ROUTINE W REFLEX MICROSCOPIC
Bilirubin Urine: NEGATIVE
Specific Gravity, Urine: 1.025 (ref 1.005–1.030)
pH: 6.5 (ref 5.0–8.0)

## 2011-05-21 LAB — URINE MICROSCOPIC-ADD ON

## 2011-05-21 MED ORDER — DEXTROSE 5 % IV SOLN
1.0000 g | Freq: Once | INTRAVENOUS | Status: AC
  Administered 2011-05-21: 1 g via INTRAVENOUS
  Filled 2011-05-21: qty 10

## 2011-05-21 MED ORDER — CIPROFLOXACIN HCL 500 MG PO TABS: 500.0000 mg | ORAL_TABLET | Freq: Two times a day (BID) | ORAL | Status: AC

## 2011-05-21 NOTE — ED Provider Notes (Addendum)
History    Scribed for [name], MD, the patient was seen in room APA19/APA19. This chart was scribed by Katha Cabal.   CSN: [id number] Arrival date & time: 05/21/2011 11:00 AM   First MD Initiated Contact with Patient 05/21/11 1158      Chief Complaint  Patient presents with  . leaking around foley     (Consider location/radiation/quality/duration/timing/severity/associated sxs/prior treatment) HPI Robert Durham is a 55 y.o. male who presents to the Emergency Department complaining of mild to moderate persistent catheter leakage that began last night.  Patient was seen in ED for same complaint on 05/19/11 for urinary retention.  Patient had catheter replaced on 05/19/11 due to urinary retention.  Patient reports catheter is leaking urine around penis and patient feels urine down his leg.  Catheter is also draining into bag.  Patient has appointment with urologist Dr. Jerre Simon in 4 days.  Symptoms are associated with burning during urination. Symptoms are not associated with abdominal pain.    PCP Evlyn Courier, MD      Past Medical History  Diagnosis Date  . Renal disorder   . Hypertension   . Depression     Past Surgical History  Procedure Date  . Kidney transplant   . Hernia repair     Family History  Problem Relation Age of Onset  . Hypertension Sister     History  Substance Use Topics  . Smoking status: Never Smoker   . Smokeless tobacco: Never Used  . Alcohol Use: 1.2 oz/week    2 Cans of beer per week     occasionally      Review of Systems  All other systems reviewed and are negative.    Allergies  Codeine and Vasotec  Home Medications   Current Outpatient Rx  Name Route Sig Dispense Refill  . ACETAMINOPHEN 500 MG PO TABS Oral Take 1,000 mg by mouth every 6 (six) hours as needed. For pain     . AMLODIPINE BESYLATE 2.5 MG PO TABS Oral Take 2.5 mg by mouth daily.      . ATORVASTATIN CALCIUM 10 MG PO TABS Oral Take 10 mg by mouth at bedtime.      . CYCLOSPORINE 100 MG PO CAPS Oral Take 100 mg by mouth 2 (two) times daily. Take with 25 mg tablet to equal 125 mg twice daily    . CYCLOSPORINE 25 MG PO CAPS Oral Take 25 mg by mouth 2 (two) times daily. Take with 100 mg tablet to equal dose of 125 mg twice daily    . ESOMEPRAZOLE MAGNESIUM 40 MG PO CPDR Oral Take 40 mg by mouth 2 (two) times daily.      Marland Kitchen MILNACIPRAN HCL 50 MG PO TABS Oral Take 50 mg by mouth 2 (two) times daily.      Marland Kitchen PREDNISONE 5 MG PO TABS Oral Take 5 mg by mouth daily.      . SULFAMETHOXAZOLE-TRIMETHOPRIM 800-160 MG PO TABS Oral Take 1 tablet by mouth every 12 (twelve) hours. 14 tablet 0  . TAMSULOSIN HCL 0.4 MG PO CAPS Oral Take 0.4 mg by mouth daily. New medication, patient has had only 2 doses of this       BP 114/82  Pulse 108  Temp(Src) 98.3 F (36.8 C) (Oral)  Resp 16  Ht 5\' 11"  (1.803 m)  Wt 179 lb (81.194 kg)  BMI 24.97 kg/m2  SpO2 100%  Physical Exam  Constitutional: He is oriented to person, place, and time. He  appears well-developed and well-nourished.  Non-toxic appearance. He does not have a sickly appearance.  HENT:  Head: Normocephalic and atraumatic.  Eyes: Conjunctivae, EOM and lids are normal.  Neck: Trachea normal, normal range of motion and full passive range of motion without pain. Neck supple.  Pulmonary/Chest: Effort normal. No respiratory distress.  Abdominal: Soft. Normal appearance. He exhibits no distension. There is no tenderness. There is no rebound.  Genitourinary: Testes normal and penis normal.       No urine leakage on exam  Musculoskeletal: Normal range of motion. He exhibits no edema.  Neurological: He is alert and oriented to person, place, and time. He has normal strength.  Skin: Skin is warm, dry and intact. No rash noted.  Psychiatric: He has a normal mood and affect. His behavior is normal.    ED Course  Procedures (including critical care time)   DIAGNOSTIC STUDIES: Oxygen Saturation is 100% on room air,  normal by my interpretation.     COORDINATION OF CARE: 12:37 PM  Physical exam complete.  Will replace catheter.  Order urinalysis.     LABS / RADIOLOGY:   Labs Reviewed  URINALYSIS, ROUTINE W REFLEX MICROSCOPIC - Abnormal; Notable for the following:    APPearance HAZY (*)    Hgb urine dipstick LARGE (*)    Leukocytes, UA SMALL (*)    All other components within normal limits  URINE MICROSCOPIC-ADD ON - Abnormal; Notable for the following:    Bacteria, UA FEW (*)    All other components within normal limits   Results for orders placed during the hospital encounter of 05/21/11  URINALYSIS, ROUTINE W REFLEX MICROSCOPIC      Component Value Range   Color, Urine YELLOW  YELLOW    APPearance HAZY (*) CLEAR    Specific Gravity, Urine 1.025  1.005 - 1.030    pH 6.5  5.0 - 8.0    Glucose, UA NEGATIVE  NEGATIVE (mg/dL)   Hgb urine dipstick LARGE (*) NEGATIVE    Bilirubin Urine NEGATIVE  NEGATIVE    Ketones, ur NEGATIVE  NEGATIVE (mg/dL)   Protein, ur NEGATIVE  NEGATIVE (mg/dL)   Urobilinogen, UA 0.2  0.0 - 1.0 (mg/dL)   Nitrite NEGATIVE  NEGATIVE    Leukocytes, UA SMALL (*) NEGATIVE   URINE MICROSCOPIC-ADD ON      Component Value Range   WBC, UA 21-50  <3 (WBC/hpf)   RBC / HPF 11-20  <3 (RBC/hpf)   Bacteria, UA FEW (*) RARE     No results found.     IMPRESSION: No diagnosis found.    MDM  Foley catheter removed and new catheter inserted.  Urinalysis shows 21-50 white cells.  Will change antibiotics.  has appointment with urologist on Tuesday.     I personally performed the services described in this documentation, which was scribed in my presence. The recorded information has been reviewed and considered.       [name], MD 05/21/11 1555  [name], MD 05/21/11 1556

## 2011-05-21 NOTE — ED Notes (Signed)
Pt states leaking around foley (at meatus) began last night. Foley was placed 12/5 on last ED visit for inflammation of the prostates and urinary retention. Pt to see Dr. Jerre Simon Tuesday. NAD

## 2011-05-21 NOTE — ED Notes (Signed)
Leg bag applied prior to discharge. 

## 2011-05-21 NOTE — ED Notes (Signed)
4x4 gauze placed around urethra.  No drainage noted on gauze.

## 2011-05-21 NOTE — ED Notes (Signed)
EDP at bedside.  NAD at this time. 

## 2011-05-22 ENCOUNTER — Encounter (HOSPITAL_COMMUNITY): Payer: Self-pay | Admitting: *Deleted

## 2011-05-22 ENCOUNTER — Emergency Department (HOSPITAL_COMMUNITY)
Admission: EM | Admit: 2011-05-22 | Discharge: 2011-05-22 | Disposition: A | Discharge status: Home / Self Care | Payer: Medicare Other | Attending: Emergency Medicine | Admitting: Emergency Medicine

## 2011-05-22 DIAGNOSIS — T839XXA Unspecified complication of genitourinary prosthetic device, implant and graft, initial encounter: Secondary | ICD-10-CM

## 2011-05-22 DIAGNOSIS — N289 Disorder of kidney and ureter, unspecified: Secondary | ICD-10-CM | POA: Insufficient documentation

## 2011-05-22 DIAGNOSIS — Z940 Kidney transplant status: Secondary | ICD-10-CM | POA: Insufficient documentation

## 2011-05-22 DIAGNOSIS — R109 Unspecified abdominal pain: Secondary | ICD-10-CM | POA: Insufficient documentation

## 2011-05-22 DIAGNOSIS — Z466 Encounter for fitting and adjustment of urinary device: Secondary | ICD-10-CM | POA: Insufficient documentation

## 2011-05-22 DIAGNOSIS — F3289 Other specified depressive episodes: Secondary | ICD-10-CM | POA: Insufficient documentation

## 2011-05-22 DIAGNOSIS — I10 Essential (primary) hypertension: Secondary | ICD-10-CM | POA: Insufficient documentation

## 2011-05-22 DIAGNOSIS — F329 Major depressive disorder, single episode, unspecified: Secondary | ICD-10-CM | POA: Insufficient documentation

## 2011-05-22 DIAGNOSIS — T83091A Other mechanical complication of indwelling urethral catheter, initial encounter: Secondary | ICD-10-CM

## 2011-05-22 HISTORY — DX: Retention of urine, unspecified: R33.9

## 2011-05-22 LAB — URINE MICROSCOPIC-ADD ON

## 2011-05-22 LAB — URINALYSIS, ROUTINE W REFLEX MICROSCOPIC
Bilirubin Urine: NEGATIVE
Glucose, UA: NEGATIVE mg/dL
Leukocytes, UA: NEGATIVE
pH: 6 (ref 5.0–8.0)

## 2011-05-22 NOTE — ED Provider Notes (Signed)
History     CSN: [id number] Arrival date & time: 05/22/2011  1:58 PM   First MD Initiated Contact with Patient 05/22/11 1405      Chief Complaint  Patient presents with  . Urinary Retention    (Consider location/radiation/quality/duration/timing/severity/associated sxs/prior treatment) HPI Comments: Robert Durham is a 55 y.o. male  With recent urinary retention and placement of a foley catheter who presents to the Emergency Department complaining of leakage around the catheter that began last night after leaving the ER. He had the foley replaced yesterday. States urine leaked around the catheter and he could not get it stopped. Denies fever, chills. Has continued right flank pain for which he is on antibiotic and analgesic.   Past Medical History  Diagnosis Date  . Renal disorder   . Hypertension   . Depression   . Urinary retention     Past Surgical History  Procedure Date  . Kidney transplant   . Hernia repair     Family History  Problem Relation Age of Onset  . Hypertension Sister     History  Substance Use Topics  . Smoking status: Never Smoker   . Smokeless tobacco: Never Used  . Alcohol Use: 1.2 oz/week    2 Cans of beer per week     occasionally      Review of Systems 10 Systems reviewed and are negative for acute change except as noted in the HPI. Allergies  Codeine and Vasotec  Home Medications   Current Outpatient Rx  Name Route Sig Dispense Refill  . ACETAMINOPHEN 500 MG PO TABS Oral Take 1,000 mg by mouth every 6 (six) hours as needed. For pain     . AMLODIPINE BESYLATE 2.5 MG PO TABS Oral Take 2.5 mg by mouth daily.      . ATORVASTATIN CALCIUM 10 MG PO TABS Oral Take 10 mg by mouth at bedtime.     Marland Kitchen CIPROFLOXACIN HCL 500 MG PO TABS Oral Take 1 tablet (500 mg total) by mouth every 12 (twelve) hours. 14 tablet 0  . CYCLOSPORINE 100 MG PO CAPS Oral Take 100 mg by mouth 2 (two) times daily. Take with 25 mg tablet to equal 125 mg twice daily    .  CYCLOSPORINE 25 MG PO CAPS Oral Take 25 mg by mouth 2 (two) times daily. Take with 100 mg tablet to equal dose of 125 mg twice daily    . ESOMEPRAZOLE MAGNESIUM 40 MG PO CPDR Oral Take 40 mg by mouth 2 (two) times daily.      Marland Kitchen MILNACIPRAN HCL 50 MG PO TABS Oral Take 50 mg by mouth 2 (two) times daily.      Marland Kitchen PREDNISONE 5 MG PO TABS Oral Take 5 mg by mouth daily.      Marland Kitchen TAMSULOSIN HCL 0.4 MG PO CAPS Oral Take 0.4 mg by mouth daily. New medication, patient has had only 2 doses of this       BP 108/79  Pulse 99  Temp(Src) 97.6 F (36.4 C) (Oral)  Resp 16  Ht 5\' 11"  (1.803 m)  Wt 179 lb (81.194 kg)  BMI 24.97 kg/m2  SpO2 99%  Physical Exam  Nursing note and vitals reviewed. Constitutional: He is oriented to person, place, and time. He appears well-developed and well-nourished.  HENT:  Head: Normocephalic and atraumatic.  Eyes: EOM are normal.  Neck: Normal range of motion.  Cardiovascular: Normal rate, normal heart sounds and intact distal pulses.   Pulmonary/Chest: Effort normal  and breath sounds normal.  Abdominal: Soft. There is no tenderness.  Genitourinary: Penis normal.       Foley catheter draining clear urine. No signs of obstruction. No leaking noted.  Musculoskeletal: Normal range of motion.  Neurological: He is alert and oriented to person, place, and time.  Skin: Skin is warm and dry.    ED Course  Procedures (including critical care time)  1520 Patient was ambulated in the ER. No leaking noted.    MDM  Patient with indwelling foley catheter here with c/o leaking. No leaking noted.Patient has a follow up appointment with Dr. Jerre Simon, urologist Tuesday 05/25/11.Pt stable in ED with no significant deterioration in condition.The patient appears reasonably screened and/or stabilized for discharge and I doubt any other medical condition or other [facility] requiring further screening, evaluation, or treatment in the ED at this time prior to discharge.  MDM Reviewed: previous  chart, nursing note and vitals Reviewed previous: labs           Nicoletta Dress. Colon Branch, MD 05/22/11 1531

## 2011-05-22 NOTE — ED Notes (Signed)
Patient states that foley catheter is still leaking around the site of insertion

## 2011-05-22 NOTE — ED Notes (Signed)
Pt c/o leaking around foley catheter. States that it was changed last night. No leakage noted at this time. Foley draining dark amber urine. Alert and oriented x 3. Skin warm and dry. Color pink.

## 2011-05-22 NOTE — ED Notes (Signed)
Pt stable at discharge no c/os

## 2011-05-22 NOTE — ED Notes (Signed)
Leg bag emptied with 100 cc urine returned. Pt ambulated with no difficulty. No leakage noted around foley. Instructions given to patient to make sure he keeps leg bag emptied and the connection screwed on tight at the end. Pt states that he has been doing that.

## 2011-05-22 NOTE — ED Provider Notes (Signed)
History  Scribed for Felisa Bonier, MD, the patient was seen in room APA11/APA11. This chart was scribed by Candelaria Stagers.    CSN: [id number] Arrival date & time: 05/22/2011  6:36 PM   First MD Initiated Contact with Patient 05/22/11 1847      Chief Complaint  Patient presents with  . Urinary Retention     HPI  Robert Durham is a 55 y.o. male who presents to the Emergency Department complaining of leakage around the foley catheter that began last night after leaving the ED.  He had the foley replaced yesterday.  Pt reports urinary retention. He denies fever and chills.       Past Medical History  Diagnosis Date  . Renal disorder   . Hypertension   . Depression   . Urinary retention     Past Surgical History  Procedure Date  . Kidney transplant   . Hernia repair     Family History  Problem Relation Age of Onset  . Hypertension Sister     History  Substance Use Topics  . Smoking status: Never Smoker   . Smokeless tobacco: Never Used  . Alcohol Use: 1.2 oz/week    2 Cans of beer per week     occasionally      Review of Systems  Constitutional: Negative for fever and chills.  HENT: Negative for rhinorrhea and neck pain.   Eyes: Negative for pain.  Respiratory: Negative for cough and shortness of breath.   Cardiovascular: Negative for chest pain.  Gastrointestinal: Negative for nausea, vomiting, abdominal pain and diarrhea.  Genitourinary: Positive for difficulty urinating.  Musculoskeletal: Negative for back pain.  Skin: Negative for rash.  Neurological: Negative for dizziness and weakness.    Allergies  Codeine and Vasotec  Home Medications   Current Outpatient Rx  Name Route Sig Dispense Refill  . ACETAMINOPHEN 500 MG PO TABS Oral Take 1,000 mg by mouth every 6 (six) hours as needed. For pain     . AMLODIPINE BESYLATE 2.5 MG PO TABS Oral Take 2.5 mg by mouth daily.      . ATORVASTATIN CALCIUM 10 MG PO TABS Oral Take 10 mg by mouth at  bedtime.     Marland Kitchen CIPROFLOXACIN HCL 500 MG PO TABS Oral Take 1 tablet (500 mg total) by mouth every 12 (twelve) hours. 14 tablet 0  . CYCLOSPORINE 100 MG PO CAPS Oral Take 100 mg by mouth 2 (two) times daily. Take with 25 mg tablet to equal 125 mg twice daily    . CYCLOSPORINE 25 MG PO CAPS Oral Take 25 mg by mouth 2 (two) times daily. Take with 100 mg tablet to equal dose of 125 mg twice daily    . ESOMEPRAZOLE MAGNESIUM 40 MG PO CPDR Oral Take 40 mg by mouth 2 (two) times daily.      Marland Kitchen MILNACIPRAN HCL 50 MG PO TABS Oral Take 50 mg by mouth 2 (two) times daily.      Marland Kitchen PREDNISONE 5 MG PO TABS Oral Take 5 mg by mouth daily.      Marland Kitchen TAMSULOSIN HCL 0.4 MG PO CAPS Oral Take 0.4 mg by mouth daily. New medication, patient has had only 2 doses of this       BP 121/94  Pulse 103  Temp(Src) 97.8 F (36.6 C) (Oral)  Resp 22  Ht 5\' 11"  (1.803 m)  Wt 179 lb (81.194 kg)  BMI 24.97 kg/m2  SpO2 96%  Physical Exam  Nursing  note and vitals reviewed. Constitutional: He is oriented to person, place, and time. He appears well-developed and well-nourished. No distress.  HENT:  Head: Normocephalic and atraumatic.  Eyes: EOM are normal. Pupils are equal, round, and reactive to light.  Neck: Neck supple. No tracheal deviation present.  Cardiovascular: Normal heart sounds.  Exam reveals no gallop and no friction rub.   No murmur heard. Pulmonary/Chest: Effort normal and breath sounds normal. He has no wheezes. He has no rales.  Abdominal: Soft. Bowel sounds are normal. He exhibits no distension. There is no tenderness.  Genitourinary: Penis normal.       Foley catheter on the right leg, collecting amber colored urine.  Normal penis, circumcised.  No current leakage noted.     Musculoskeletal: Normal range of motion. He exhibits no edema.  Neurological: He is alert and oriented to person, place, and time. No sensory deficit.  Skin: Skin is warm and dry.  Psychiatric: He has a normal mood and affect. His  behavior is normal.    ED Course  Procedures  DIAGNOSTIC STUDIES: Oxygen Saturation is 96% on room air, normal by my interpretation.    COORDINATION OF CARE:  7:14PM Ordered: Irrigate foley ; Urinalysis with microscopic  8:16PM Ordered: URINE CULTURE   Labs Reviewed  URINALYSIS, ROUTINE W REFLEX MICROSCOPIC - Abnormal; Notable for the following:    APPearance CLOUDY (*)    Specific Gravity, Urine >1.030 (*)    Hgb urine dipstick LARGE (*)    Ketones, ur TRACE (*)    Protein, ur 30 (*)    All other components within normal limits  URINE MICROSCOPIC-ADD ON   No results found.   No diagnosis found.    MDM  8:41 PM Foley catheter was successfully and easily irrigated by the nurse with free-flowing urine. There is no apparent Foley catheter obstruction and no strong suggestion of urinary tract infection. The patient will be discharged home to followup as an outpatient with urology at the beginning of the week. The patient states his understanding of and agreement with the plan of care.  Possible UTI, will check urinalysis to exclude, clog in foley catheter will have nurse irrigate foley to rule out, at this time appears to be functioning normally, does not appear to be any urinary retention.    I personally performed the services described in this documentation, which was scribed in my presence. The recorded information has been reviewed and considered.       Felisa Bonier, MD 05/22/11 [phone]

## 2011-05-22 NOTE — ED Notes (Signed)
Foley irrigated, no blockage, fluid flows freely.

## 2011-05-22 NOTE — ED Notes (Signed)
States that foley catheter is leaking again, was seen last night and states that foley was changed last night

## 2011-05-23 ENCOUNTER — Encounter (HOSPITAL_COMMUNITY): Payer: Self-pay | Admitting: Emergency Medicine

## 2011-05-23 LAB — URINE CULTURE: Culture  Setup Time: 201212080156

## 2011-05-25 LAB — URINE CULTURE
Colony Count: NO GROWTH
Culture  Setup Time: 201212101545

## 2011-06-02 ENCOUNTER — Encounter (HOSPITAL_COMMUNITY): Payer: Self-pay | Admitting: *Deleted

## 2011-06-02 ENCOUNTER — Emergency Department (HOSPITAL_COMMUNITY)
Admission: EM | Admit: 2011-06-02 | Discharge: 2011-06-02 | Disposition: A | Discharge status: Home / Self Care | Payer: Medicare Other | Attending: Emergency Medicine | Admitting: Emergency Medicine

## 2011-06-02 ENCOUNTER — Emergency Department (HOSPITAL_COMMUNITY): Payer: Medicare Other

## 2011-06-02 DIAGNOSIS — M25569 Pain in unspecified knee: Secondary | ICD-10-CM | POA: Insufficient documentation

## 2011-06-02 DIAGNOSIS — Z79899 Other long term (current) drug therapy: Secondary | ICD-10-CM | POA: Insufficient documentation

## 2011-06-02 DIAGNOSIS — IMO0002 Reserved for concepts with insufficient information to code with codable children: Secondary | ICD-10-CM | POA: Insufficient documentation

## 2011-06-02 DIAGNOSIS — M1710 Unilateral primary osteoarthritis, unspecified knee: Secondary | ICD-10-CM | POA: Insufficient documentation

## 2011-06-02 DIAGNOSIS — I10 Essential (primary) hypertension: Secondary | ICD-10-CM | POA: Insufficient documentation

## 2011-06-02 DIAGNOSIS — R300 Dysuria: Secondary | ICD-10-CM | POA: Insufficient documentation

## 2011-06-02 DIAGNOSIS — M25469 Effusion, unspecified knee: Secondary | ICD-10-CM | POA: Insufficient documentation

## 2011-06-02 DIAGNOSIS — R3919 Other difficulties with micturition: Secondary | ICD-10-CM | POA: Insufficient documentation

## 2011-06-02 DIAGNOSIS — F3289 Other specified depressive episodes: Secondary | ICD-10-CM | POA: Insufficient documentation

## 2011-06-02 DIAGNOSIS — M25561 Pain in right knee: Secondary | ICD-10-CM

## 2011-06-02 DIAGNOSIS — F329 Major depressive disorder, single episode, unspecified: Secondary | ICD-10-CM | POA: Insufficient documentation

## 2011-06-02 DIAGNOSIS — M1990 Unspecified osteoarthritis, unspecified site: Secondary | ICD-10-CM

## 2011-06-02 MED ORDER — DICLOFENAC SODIUM 1 % TD GEL: 1.0000 "application " | Freq: Three times a day (TID) | TRANSDERMAL | Status: DC

## 2011-06-02 MED ORDER — HYDROCODONE-ACETAMINOPHEN 5-325 MG PO TABS: 1.0000 | ORAL_TABLET | ORAL | Status: DC | PRN

## 2011-06-02 NOTE — ED Provider Notes (Signed)
History     CSN: [id number] Arrival date & time: 06/02/2011  7:56 AM   First MD Initiated Contact with Patient 06/02/11 0805      Chief Complaint  Patient presents with  . Knee Pain    (Consider location/radiation/quality/duration/timing/severity/associated sxs/prior treatment) HPI Comments: Patient reports 2-3 days of pain of the right knee. No known injury or trauma. Patient denies any excessive walking bending stooping or standing. He states that his activities have not been excessive recently. He does report history of some degenerative joint disease problems with the knee in the past. He has noted mild swelling. He is able to sustain his weight on the knee. He denies any hot joint. No history of fever. No previous operations or procedures on the right knee. He has tried Tylenol for pain without any success. He presents now for assistance with this particular problem.  Patient is a 55 y.o. male presenting with knee pain. The history is provided by the patient.  Knee Pain Associated symptoms include arthralgias. Pertinent negatives include no abdominal pain, chest pain, coughing or neck pain.    Past Medical History  Diagnosis Date  . Renal disorder   . Hypertension   . Depression   . Urinary retention     Past Surgical History  Procedure Date  . Kidney transplant   . Hernia repair     Family History  Problem Relation Age of Onset  . Hypertension Sister     History  Substance Use Topics  . Smoking status: Never Smoker   . Smokeless tobacco: Never Used  . Alcohol Use: 1.2 oz/week    2 Cans of beer per week     occasionally      Review of Systems  Constitutional: Negative for activity change.       All ROS Neg except as noted in HPI  HENT: Negative for nosebleeds and neck pain.   Eyes: Negative for photophobia and discharge.  Respiratory: Negative for cough, shortness of breath and wheezing.   Cardiovascular: Negative for chest pain and palpitations.    Gastrointestinal: Negative for abdominal pain and blood in stool.  Genitourinary: Positive for dysuria and difficulty urinating. Negative for frequency and hematuria.  Musculoskeletal: Positive for back pain and arthralgias.  Skin: Negative.   Neurological: Negative for dizziness, seizures and speech difficulty.  Psychiatric/Behavioral: Negative for hallucinations and confusion.    Allergies  Codeine and Vasotec  Home Medications   Current Outpatient Rx  Name Route Sig Dispense Refill  . ACETAMINOPHEN 500 MG PO TABS Oral Take 1,000 mg by mouth every 6 (six) hours as needed. For pain     . AMLODIPINE BESYLATE 2.5 MG PO TABS Oral Take 2.5 mg by mouth daily.      . ATORVASTATIN CALCIUM 10 MG PO TABS Oral Take 10 mg by mouth at bedtime.     . CYCLOSPORINE 100 MG PO CAPS Oral Take 100 mg by mouth 2 (two) times daily. Take with 25 mg tablet to equal 125 mg twice daily    . CYCLOSPORINE 25 MG PO CAPS Oral Take 25 mg by mouth 2 (two) times daily. Take with 100 mg tablet to equal dose of 125 mg twice daily    . ESOMEPRAZOLE MAGNESIUM 40 MG PO CPDR Oral Take 40 mg by mouth 2 (two) times daily.      Marland Kitchen MILNACIPRAN HCL 50 MG PO TABS Oral Take 50 mg by mouth 2 (two) times daily.      Marland Kitchen PREDNISONE 5  MG PO TABS Oral Take 5 mg by mouth daily.      Marland Kitchen TAMSULOSIN HCL 0.4 MG PO CAPS Oral Take 0.4 mg by mouth daily. New medication, patient has had only 2 doses of this       BP 133/91  Pulse 86  Temp(Src) 97.7 F (36.5 C) (Oral)  Resp 16  Ht 5\' 11"  (1.803 m)  Wt 179 lb (81.194 kg)  BMI 24.97 kg/m2  SpO2 99%  Physical Exam  Nursing note and vitals reviewed. Constitutional: He is oriented to person, place, and time. He appears well-developed and well-nourished.  Non-toxic appearance.  HENT:  Head: Normocephalic.  Right Ear: Tympanic membrane and external ear normal.  Left Ear: Tympanic membrane and external ear normal.  Eyes: EOM and lids are normal. Pupils are equal, round, and reactive to  light.  Neck: Normal range of motion. Neck supple. Carotid bruit is not present.  Cardiovascular: Normal rate, regular rhythm, normal heart sounds, intact distal pulses and normal pulses.   Pulmonary/Chest: Breath sounds normal. No respiratory distress.  Abdominal: Soft. Bowel sounds are normal. There is no tenderness. There is no guarding.  Musculoskeletal: Normal range of motion.       There is full range of motion of the toes and ankle of the right lower extremity. There is mild swelling and mild increased warmth of the right knee. No effusion. No posterior mass. There is crepitus with attempted range of motion. Full range of motion of the right hip.  Lymphadenopathy:       Head (right side): No submandibular adenopathy present.       Head (left side): No submandibular adenopathy present.    He has no cervical adenopathy.  Neurological: He is alert and oriented to person, place, and time. He has normal strength. No cranial nerve deficit or sensory deficit.  Skin: Skin is warm and dry.  Psychiatric: He has a normal mood and affect. His speech is normal.    ED Course  Procedures (including critical care time) Pulse oximetry 99% on room air. Within normal limits by my interpretation. Labs Reviewed - No data to display Dg Knee Complete 4 Views Right  06/02/2011  *RADIOLOGY REPORT*  Clinical Data: Knee pain.  RIGHT KNEE - COMPLETE 4+ VIEW  Comparison: Plain films of the right knee 08/14/2009.  Findings: No acute bony or joint abnormality is identified. 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  No joint effusion.  IMPRESSION:  1.  No acute finding. 2.  Degenerative disease.  Stable compared to prior exam.  Original Report Authenticated By: Bernadene Bell. Maricela Curet, M.D.    Dx: Degenerative joint disease right knee.   MDM  I have reviewed nursing notes, vital signs, and all appropriate lab and  imaging results for this patient.        Kathie Dike, [place] 06/02/11 [phone]

## 2011-06-02 NOTE — ED Notes (Signed)
Pt states right knee pain x 2 days. No known injury.

## 2011-06-02 NOTE — ED Provider Notes (Signed)
Medical screening examination/treatment/procedure(s) were performed by non-physician practitioner and as supervising physician I was immediately available for consultation/collaboration.  [name], MD 06/02/11 1355

## 2011-07-19 DIAGNOSIS — M1990 Unspecified osteoarthritis, unspecified site: Secondary | ICD-10-CM | POA: Diagnosis not present

## 2011-07-19 DIAGNOSIS — E109 Type 1 diabetes mellitus without complications: Secondary | ICD-10-CM | POA: Diagnosis not present

## 2011-07-19 DIAGNOSIS — R5383 Other fatigue: Secondary | ICD-10-CM | POA: Diagnosis not present

## 2011-07-19 DIAGNOSIS — D649 Anemia, unspecified: Secondary | ICD-10-CM | POA: Diagnosis not present

## 2011-07-19 DIAGNOSIS — I110 Hypertensive heart disease with heart failure: Secondary | ICD-10-CM | POA: Diagnosis not present

## 2011-07-19 DIAGNOSIS — I10 Essential (primary) hypertension: Secondary | ICD-10-CM | POA: Diagnosis not present

## 2011-07-19 DIAGNOSIS — N400 Enlarged prostate without lower urinary tract symptoms: Secondary | ICD-10-CM | POA: Diagnosis not present

## 2011-07-19 DIAGNOSIS — E785 Hyperlipidemia, unspecified: Secondary | ICD-10-CM | POA: Diagnosis not present

## 2011-07-19 DIAGNOSIS — R5381 Other malaise: Secondary | ICD-10-CM | POA: Diagnosis not present

## 2011-07-27 DIAGNOSIS — D638 Anemia in other chronic diseases classified elsewhere: Secondary | ICD-10-CM | POA: Diagnosis not present

## 2011-07-27 DIAGNOSIS — M1990 Unspecified osteoarthritis, unspecified site: Secondary | ICD-10-CM | POA: Diagnosis not present

## 2011-07-27 DIAGNOSIS — E1065 Type 1 diabetes mellitus with hyperglycemia: Secondary | ICD-10-CM | POA: Diagnosis not present

## 2011-07-27 DIAGNOSIS — E539 Vitamin B deficiency, unspecified: Secondary | ICD-10-CM | POA: Diagnosis not present

## 2011-07-27 DIAGNOSIS — D500 Iron deficiency anemia secondary to blood loss (chronic): Secondary | ICD-10-CM | POA: Diagnosis not present

## 2011-07-27 DIAGNOSIS — E538 Deficiency of other specified B group vitamins: Secondary | ICD-10-CM | POA: Diagnosis not present

## 2011-07-27 DIAGNOSIS — D529 Folate deficiency anemia, unspecified: Secondary | ICD-10-CM | POA: Diagnosis not present

## 2011-08-23 ENCOUNTER — Emergency Department (HOSPITAL_COMMUNITY): Payer: Medicare Other

## 2011-08-23 ENCOUNTER — Emergency Department (HOSPITAL_COMMUNITY)
Admission: EM | Admit: 2011-08-23 | Discharge: 2011-08-23 | Disposition: A | Discharge status: Home / Self Care | Payer: Medicare Other | Attending: Emergency Medicine | Admitting: Emergency Medicine

## 2011-08-23 ENCOUNTER — Encounter (HOSPITAL_COMMUNITY): Payer: Self-pay | Admitting: *Deleted

## 2011-08-23 DIAGNOSIS — I10 Essential (primary) hypertension: Secondary | ICD-10-CM | POA: Diagnosis not present

## 2011-08-23 DIAGNOSIS — Z79899 Other long term (current) drug therapy: Secondary | ICD-10-CM | POA: Insufficient documentation

## 2011-08-23 DIAGNOSIS — R112 Nausea with vomiting, unspecified: Secondary | ICD-10-CM | POA: Diagnosis not present

## 2011-08-23 DIAGNOSIS — M545 Low back pain, unspecified: Secondary | ICD-10-CM | POA: Diagnosis not present

## 2011-08-23 DIAGNOSIS — K5730 Diverticulosis of large intestine without perforation or abscess without bleeding: Secondary | ICD-10-CM | POA: Diagnosis not present

## 2011-08-23 DIAGNOSIS — R109 Unspecified abdominal pain: Secondary | ICD-10-CM | POA: Diagnosis not present

## 2011-08-23 DIAGNOSIS — S339XXA Sprain of unspecified parts of lumbar spine and pelvis, initial encounter: Secondary | ICD-10-CM | POA: Insufficient documentation

## 2011-08-23 DIAGNOSIS — S39012A Strain of muscle, fascia and tendon of lower back, initial encounter: Secondary | ICD-10-CM

## 2011-08-23 DIAGNOSIS — E119 Type 2 diabetes mellitus without complications: Secondary | ICD-10-CM | POA: Diagnosis not present

## 2011-08-23 DIAGNOSIS — F329 Major depressive disorder, single episode, unspecified: Secondary | ICD-10-CM | POA: Insufficient documentation

## 2011-08-23 DIAGNOSIS — X58XXXA Exposure to other specified factors, initial encounter: Secondary | ICD-10-CM | POA: Insufficient documentation

## 2011-08-23 DIAGNOSIS — N269 Renal sclerosis, unspecified: Secondary | ICD-10-CM | POA: Diagnosis not present

## 2011-08-23 DIAGNOSIS — F3289 Other specified depressive episodes: Secondary | ICD-10-CM | POA: Insufficient documentation

## 2011-08-23 DIAGNOSIS — K4090 Unilateral inguinal hernia, without obstruction or gangrene, not specified as recurrent: Secondary | ICD-10-CM | POA: Diagnosis not present

## 2011-08-23 LAB — DIFFERENTIAL
Basophils Relative: 0 % (ref 0–1)
Eosinophils Relative: 4 % (ref 0–5)
Lymphs Abs: 2.2 10*3/uL (ref 0.7–4.0)
Monocytes Absolute: 0.6 10*3/uL (ref 0.1–1.0)

## 2011-08-23 LAB — LIPASE, BLOOD: Lipase: 28 U/L (ref 11–59)

## 2011-08-23 LAB — URINALYSIS, ROUTINE W REFLEX MICROSCOPIC
Leukocytes, UA: NEGATIVE
Protein, ur: NEGATIVE mg/dL
Urobilinogen, UA: 0.2 mg/dL (ref 0.0–1.0)

## 2011-08-23 LAB — COMPREHENSIVE METABOLIC PANEL
BUN: 13 mg/dL (ref 6–23)
CO2: 28 mEq/L (ref 19–32)
Calcium: 9.5 mg/dL (ref 8.4–10.5)
Creatinine, Ser: 1.08 mg/dL (ref 0.50–1.35)
GFR calc Af Amer: 87 mL/min — ABNORMAL LOW (ref >90)
GFR calc non Af Amer: 75 mL/min — ABNORMAL LOW (ref >90)
Glucose, Bld: 84 mg/dL (ref 70–99)

## 2011-08-23 LAB — CBC
MCH: 22.2 pg — ABNORMAL LOW (ref 26.0–34.0)
MCV: 70 fL — ABNORMAL LOW (ref 78.0–100.0)
Platelets: 163 10*3/uL (ref 150–400)
RBC: 6.04 MIL/uL — ABNORMAL HIGH (ref 4.22–5.81)

## 2011-08-23 LAB — URINE MICROSCOPIC-ADD ON

## 2011-08-23 MED ORDER — CIPROFLOXACIN HCL 500 MG PO TABS: 500.0000 mg | ORAL_TABLET | Freq: Two times a day (BID) | ORAL | Status: AC

## 2011-08-23 MED ORDER — SODIUM CHLORIDE 0.9 % IV BOLUS (SEPSIS)
1000.0000 mL | Freq: Once | INTRAVENOUS | Status: AC
  Administered 2011-08-23: 1000 mL via INTRAVENOUS

## 2011-08-23 MED ORDER — HYDROMORPHONE HCL PF 1 MG/ML IJ SOLN
1.0000 mg | Freq: Once | INTRAMUSCULAR | Status: AC
  Administered 2011-08-23: 1 mg via INTRAVENOUS
  Filled 2011-08-23: qty 1

## 2011-08-23 MED ORDER — ONDANSETRON HCL 4 MG/2ML IJ SOLN
4.0000 mg | Freq: Once | INTRAMUSCULAR | Status: AC
  Administered 2011-08-23: 4 mg via INTRAVENOUS
  Filled 2011-08-23: qty 2

## 2011-08-23 MED ORDER — OXYCODONE-ACETAMINOPHEN 5-325 MG PO TABS: 1.0000 | ORAL_TABLET | Freq: Four times a day (QID) | ORAL | Status: AC | PRN

## 2011-08-23 NOTE — Discharge Instructions (Signed)
Muscle Strain A muscle strain, or pulled muscle, occurs when a muscle is over-stretched. A small number of muscle fibers may also be torn. This is especially common in athletes. This happens when a sudden violent force placed on a muscle pushes it past its capacity. Usually, recovery from a pulled muscle takes 1 to 2 weeks. But complete healing will take 5 to 6 weeks. There are millions of muscle fibers. Following injury, your body will usually return to normal quickly. HOME CARE INSTRUCTIONS   While awake, apply ice to the sore muscle for 15 to 20 minutes each hour for the first 2 days. Put ice in a plastic bag and place a towel between the bag of ice and your skin.   Do not use the pulled muscle for several days. Do not use the muscle if you have pain.   You may wrap the injured area with an elastic bandage for comfort. Be careful not to bind it too tightly. This may interfere with blood circulation.   Only take over-the-counter or prescription medicines for pain, discomfort, or fever as directed by your caregiver. Do not use aspirin as this will increase bleeding (bruising) at injury site.   Warming up before exercise helps prevent muscle strains.  SEEK MEDICAL CARE IF:  There is increased pain or swelling in the affected area. MAKE SURE YOU:   Understand these instructions.   Will watch your condition.   Will get help right away if you are not doing well or get worse.  Document Released: 05/31/2005 Document Revised: 05/20/2011 Document Reviewed: 12/28/2006 [employer] Patient Information 2012 [employer]. 

## 2011-08-23 NOTE — ED Notes (Signed)
Lower back pain radiating down bil legs x 2 days.  Mid abd pain with n/v that started this morning.

## 2011-08-23 NOTE — ED Provider Notes (Signed)
History   This chart was scribed for Robert Bailiff, MD by Clarita Crane. The patient was seen in room APA12/APA12. Patient's care was started at 0913.    CSN: [id number]  Arrival date & time 08/23/11  4098   First MD Initiated Contact with Patient 08/23/11 1009      Chief Complaint  Patient presents with  . Back Pain  . Abdominal Pain    (Consider location/radiation/quality/duration/timing/severity/associated sxs/prior treatment) HPI Brace Welte Glassburn is a 56 y.o. male who presents to the Emergency Department complaining of constant moderate left sided lower back pain onset 2 days ago and persistent since with associated nausea and vomiting. Patient notes pain is worse with walking, movement. Denies dysuria, hematuria, HA, recent injury/trauma. Patient with h/o kidney transplant performed in 1994, renal disorder, HTN, diabetes.   Past Medical History  Diagnosis Date  . Renal disorder   . Hypertension   . Depression   . Urinary retention   . Diabetes mellitus     Past Surgical History  Procedure Date  . Kidney transplant   . Hernia repair     Family History  Problem Relation Age of Onset  . Hypertension Sister     History  Substance Use Topics  . Smoking status: Never Smoker   . Smokeless tobacco: Never Used  . Alcohol Use: 1.2 oz/week    2 Cans of beer per week     occasionally      Review of Systems  Constitutional: Negative for fever and chills.  HENT: Negative for rhinorrhea and neck pain.   Eyes: Negative for pain.  Respiratory: Negative for cough and shortness of breath.   Cardiovascular: Negative for chest pain.  Gastrointestinal: Positive for nausea, vomiting and abdominal pain. Negative for diarrhea.  Genitourinary: Negative for dysuria.  Musculoskeletal: Positive for back pain.  Skin: Negative for rash.  Neurological: Negative for dizziness and weakness.    Allergies  Codeine and Vasotec  Home Medications   Current Outpatient Rx  Name Route  Sig Dispense Refill  . AMLODIPINE BESYLATE 2.5 MG PO TABS Oral Take 2.5 mg by mouth daily.      . ATORVASTATIN CALCIUM 10 MG PO TABS Oral Take 10 mg by mouth at bedtime.     . CYCLOSPORINE 100 MG PO CAPS Oral Take 100 mg by mouth 2 (two) times daily. Take with 25 mg tablet to equal 125 mg twice daily    . CYCLOSPORINE 25 MG PO CAPS Oral Take 25 mg by mouth 2 (two) times daily. Take with 100 mg tablet to equal dose of 125 mg twice daily    . DICLOFENAC SODIUM 1 % TD GEL Topical Apply 1 application topically 3 (three) times daily. 100 g 0  . ESOMEPRAZOLE MAGNESIUM 40 MG PO CPDR Oral Take 40 mg by mouth 2 (two) times daily.      Marland Kitchen MILNACIPRAN HCL 50 MG PO TABS Oral Take 50 mg by mouth 2 (two) times daily.      Marland Kitchen PREDNISONE 5 MG PO TABS Oral Take 5 mg by mouth daily.      Marland Kitchen TAMSULOSIN HCL 0.4 MG PO CAPS Oral Take 0.4 mg by mouth daily.     Marland Kitchen CIPROFLOXACIN HCL 500 MG PO TABS Oral Take 1 tablet (500 mg total) by mouth 2 (two) times daily. 10 tablet 0  . OXYCODONE-ACETAMINOPHEN 5-325 MG PO TABS Oral Take 1-2 tablets by mouth every 6 (six) hours as needed for pain. 20 tablet 0    BP  121/90  Pulse 70  Temp(Src) 98 F (36.7 C) (Oral)  Resp 12  Ht 5\' 11"  (1.803 m)  Wt 180 lb (81.647 kg)  BMI 25.10 kg/m2  SpO2 98%  Physical Exam  Nursing note and vitals reviewed. Constitutional: He is oriented to person, place, and time. He appears well-developed and well-nourished. No distress.  HENT:  Head: Normocephalic and atraumatic.  Eyes: EOM are normal. Pupils are equal, round, and reactive to light.  Neck: Neck supple. No tracheal deviation present.  Cardiovascular: Normal rate.   Pulmonary/Chest: Effort normal. No respiratory distress.  Abdominal: Soft. He exhibits no distension. There is no tenderness.  Musculoskeletal: Normal range of motion. He exhibits no edema.       Midline spine non-tender. Left lower lateral back tender to palpation.   Neurological: He is alert and oriented to person, place,  and time. No sensory deficit.  Skin: Skin is warm and dry.  Psychiatric: He has a normal mood and affect. His behavior is normal.    ED Course  Procedures (including critical care time)  DIAGNOSTIC STUDIES: Oxygen Saturation is 100% on room air, normal by my interpretation.    COORDINATION OF CARE: 10:19AM-Patient informed of current plan for treatment and evaluation and agrees with plan at this time.  12:02PM- Patient notes pain has improved at this time.    Results for orders placed during the hospital encounter of 08/23/11  CBC      Component Value Range   WBC 5.9  4.0 - 10.5 (K/uL)   RBC 6.04 (*) 4.22 - 5.81 (MIL/uL)   Hemoglobin 13.4  13.0 - 17.0 (g/dL)   HCT 40.9  81.1 - 91.4 (%)   MCV 70.0 (*) 78.0 - 100.0 (fL)   MCH 22.2 (*) 26.0 - 34.0 (pg)   MCHC 31.7  30.0 - 36.0 (g/dL)   RDW 78.2 (*) 95.6 - 15.5 (%)   Platelets 163  150 - 400 (K/uL)  DIFFERENTIAL      Component Value Range   Neutrophils Relative 47  43 - 77 (%)   Lymphocytes Relative 38  12 - 46 (%)   Monocytes Relative 11  3 - 12 (%)   Eosinophils Relative 4  0 - 5 (%)   Basophils Relative 0  0 - 1 (%)   Neutro Abs 2.9  1.7 - 7.7 (K/uL)   Lymphs Abs 2.2  0.7 - 4.0 (K/uL)   Monocytes Absolute 0.6  0.1 - 1.0 (K/uL)   Eosinophils Absolute 0.2  0.0 - 0.7 (K/uL)   Basophils Absolute 0.0  0.0 - 0.1 (K/uL)   Smear Review PLATELET COUNT CONFIRMED BY SMEAR    COMPREHENSIVE METABOLIC PANEL      Component Value Range   Sodium 138  135 - 145 (mEq/L)   Potassium 4.2  3.5 - 5.1 (mEq/L)   Chloride 105  96 - 112 (mEq/L)   CO2 28  19 - 32 (mEq/L)   Glucose, Bld 84  70 - 99 (mg/dL)   BUN 13  6 - 23 (mg/dL)   Creatinine, Ser 2.13  0.50 - 1.35 (mg/dL)   Calcium 9.5  8.4 - 08.6 (mg/dL)   Total Protein 7.8  6.0 - 8.3 (g/dL)   Albumin 3.4 (*) 3.5 - 5.2 (g/dL)   AST 26  0 - 37 (U/L)   ALT 14  0 - 53 (U/L)   Alkaline Phosphatase 77  39 - 117 (U/L)   Total Bilirubin 0.3  0.3 - 1.2 (mg/dL)   GFR  calc non Af Amer 75 (*) >90  (mL/min)   GFR calc Af Amer 87 (*) >90 (mL/min)  LIPASE, BLOOD      Component Value Range   Lipase 28  11 - 59 (U/L)  URINALYSIS, ROUTINE W REFLEX MICROSCOPIC      Component Value Range   Color, Urine YELLOW  YELLOW    APPearance CLEAR  CLEAR    Specific Gravity, Urine 1.020  1.005 - 1.030    pH 6.5  5.0 - 8.0    Glucose, UA NEGATIVE  NEGATIVE (mg/dL)   Hgb urine dipstick NEGATIVE  NEGATIVE    Bilirubin Urine NEGATIVE  NEGATIVE    Ketones, ur NEGATIVE  NEGATIVE (mg/dL)   Protein, ur NEGATIVE  NEGATIVE (mg/dL)   Urobilinogen, UA 0.2  0.0 - 1.0 (mg/dL)   Nitrite POSITIVE (*) NEGATIVE    Leukocytes, UA NEGATIVE  NEGATIVE   URINE MICROSCOPIC-ADD ON      Component Value Range   WBC, UA 0-2  <3 (WBC/hpf)   RBC / HPF 0-2  <3 (RBC/hpf)   Bacteria, UA MANY (*) RARE     Ct Abdomen Pelvis Wo Contrast  08/23/2011  *RADIOLOGY REPORT*  Clinical Data: Flank and back pain.  Nausea vomiting.  CT ABDOMEN AND PELVIS WITHOUT CONTRAST  Technique:  Multidetector CT imaging of the abdomen and pelvis was performed following the standard protocol without intravenous contrast.  Comparison: 11/08/2010  Findings: Tiny hypodense lesions in the liver are again noted. Spleen has unremarkable uninfused appearance.  The stomach, duodenum, pancreas, gallbladder, and adrenal glands are unremarkable.  The native kidneys are atrophic.  No abdominal aortic aneurysm.  No free fluid or lymphadenopathy in the abdomen.  Imaging through the pelvis shows a transplant kidney in the left lower quadrant.  No evidence for stone disease in the transplant kidney.  There is no peri transplant edema or fluid.  No hydronephrosis.  No free intraperitoneal fluid in the pelvis.  There is diffuse diverticular disease of the colon without evidence for diverticulitis.  The terminal ileum is normal.  The appendix is normal.  Right inguinal hernia contains only fat.  No evidence for pelvic sidewall lymphadenopathy.  Degenerative changes are seen in  the left hip Bone windows reveal no worrisome lytic or sclerotic osseous lesions.  IMPRESSION: No acute findings.  No evidence to explain the patient's history of left back and flank pain.  Diffuse colonic diverticulosis without diverticulitis.  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  Right inguinal hernia contains only fat.  Original Report Authenticated By: ERIC A. MANSELL, M.D.     1. Lumbosacral strain       MDM  Consistent with a lumbosacral strain. Given the degree of pain and history of renal transplant ACT was performed to rule out pyelonephritis and kidney stone. This is unremarkable. He has nitrites and bacteria on his urinalysis therefore will treat him for 5 days. He'll be treated for a lumbosacral strain. Encouraged ice for the first 2 days and heat thereafter. I'll prescribe Percocet. I will refrain from NSAIDs.  No concern about malignant cause of back pain such as cauda equina      I personally performed the services described in this documentation, which was scribed in my presence. The recorded information has been reviewed and considered.    Robert Bailiff, MD 08/23/11 1356

## 2011-08-23 NOTE — ED Notes (Signed)
Patient was given Ginger Ale after checking with his nurse to get approval.

## 2011-08-23 NOTE — ED Notes (Signed)
Pt resting comfortably at this time. Drinking po fluids with no c/o nausea. States that his pain is better than it was.

## 2011-08-26 DIAGNOSIS — E785 Hyperlipidemia, unspecified: Secondary | ICD-10-CM | POA: Diagnosis not present

## 2011-08-26 DIAGNOSIS — E1065 Type 1 diabetes mellitus with hyperglycemia: Secondary | ICD-10-CM | POA: Diagnosis not present

## 2011-08-26 DIAGNOSIS — D500 Iron deficiency anemia secondary to blood loss (chronic): Secondary | ICD-10-CM | POA: Diagnosis not present

## 2011-08-26 LAB — URINE CULTURE
Colony Count: >=100000
Culture  Setup Time: 201303112214

## 2011-08-27 NOTE — ED Notes (Signed)
+   Urine Patient treated with Cipro sent to EDP office for review.

## 2011-08-29 NOTE — ED Notes (Signed)
Patient returned call. Informed of new RX. RX called in to [place] [phone]) by Norm Parcel PFM.

## 2011-08-29 NOTE — ED Notes (Signed)
Chart returned from EDP office. Recommended Macrobid 100 mg PO BID X 5 days. Follow up with PCP for further evaluation. Prescribed by Fayrene Helper PA-C. Attempted to call. No answer.

## 2011-09-13 DIAGNOSIS — N400 Enlarged prostate without lower urinary tract symptoms: Secondary | ICD-10-CM | POA: Diagnosis not present

## 2011-09-21 ENCOUNTER — Emergency Department (HOSPITAL_COMMUNITY)
Admission: EM | Admit: 2011-09-21 | Discharge: 2011-09-21 | Disposition: A | Discharge status: Home / Self Care | Payer: Medicare Other | Attending: Emergency Medicine | Admitting: Emergency Medicine

## 2011-09-21 ENCOUNTER — Emergency Department (HOSPITAL_COMMUNITY): Payer: Medicare Other

## 2011-09-21 ENCOUNTER — Encounter (HOSPITAL_COMMUNITY): Payer: Self-pay

## 2011-09-21 DIAGNOSIS — E119 Type 2 diabetes mellitus without complications: Secondary | ICD-10-CM | POA: Diagnosis not present

## 2011-09-21 DIAGNOSIS — Z79899 Other long term (current) drug therapy: Secondary | ICD-10-CM | POA: Diagnosis not present

## 2011-09-21 DIAGNOSIS — R112 Nausea with vomiting, unspecified: Secondary | ICD-10-CM | POA: Diagnosis not present

## 2011-09-21 DIAGNOSIS — F329 Major depressive disorder, single episode, unspecified: Secondary | ICD-10-CM | POA: Diagnosis not present

## 2011-09-21 DIAGNOSIS — Z940 Kidney transplant status: Secondary | ICD-10-CM | POA: Diagnosis not present

## 2011-09-21 DIAGNOSIS — R1033 Periumbilical pain: Secondary | ICD-10-CM | POA: Insufficient documentation

## 2011-09-21 DIAGNOSIS — R197 Diarrhea, unspecified: Secondary | ICD-10-CM | POA: Diagnosis not present

## 2011-09-21 DIAGNOSIS — F3289 Other specified depressive episodes: Secondary | ICD-10-CM | POA: Insufficient documentation

## 2011-09-21 DIAGNOSIS — I10 Essential (primary) hypertension: Secondary | ICD-10-CM | POA: Insufficient documentation

## 2011-09-21 DIAGNOSIS — R1032 Left lower quadrant pain: Secondary | ICD-10-CM | POA: Diagnosis not present

## 2011-09-21 LAB — CBC
Hemoglobin: 12.6 g/dL — ABNORMAL LOW (ref 13.0–17.0)
MCHC: 32.1 g/dL (ref 30.0–36.0)
RBC: 5.68 MIL/uL (ref 4.22–5.81)
WBC: 10 10*3/uL (ref 4.0–10.5)

## 2011-09-21 LAB — URINALYSIS, ROUTINE W REFLEX MICROSCOPIC
Glucose, UA: NEGATIVE mg/dL
Ketones, ur: 15 mg/dL — AB
Leukocytes, UA: NEGATIVE
pH: 6 (ref 5.0–8.0)

## 2011-09-21 LAB — DIFFERENTIAL
Basophils Absolute: 0 10*3/uL (ref 0.0–0.1)
Eosinophils Relative: 1 % (ref 0–5)
Lymphocytes Relative: 15 % (ref 12–46)
Monocytes Relative: 8 % (ref 3–12)
Neutro Abs: 7.6 10*3/uL (ref 1.7–7.7)

## 2011-09-21 LAB — BASIC METABOLIC PANEL
CO2: 21 mEq/L (ref 19–32)
Chloride: 103 mEq/L (ref 96–112)
Creatinine, Ser: 1.21 mg/dL (ref 0.50–1.35)

## 2011-09-21 MED ORDER — HYDROMORPHONE HCL PF 1 MG/ML IJ SOLN
1.0000 mg | Freq: Once | INTRAMUSCULAR | Status: AC
  Administered 2011-09-21: 1 mg via INTRAVENOUS
  Filled 2011-09-21: qty 1

## 2011-09-21 MED ORDER — SODIUM CHLORIDE 0.9 % IV SOLN
INTRAVENOUS | Status: DC
  Administered 2011-09-21: 20:00:00 via INTRAVENOUS

## 2011-09-21 MED ORDER — ONDANSETRON HCL 8 MG PO TABS: 8.0000 mg | ORAL_TABLET | ORAL | Status: AC | PRN

## 2011-09-21 MED ORDER — ONDANSETRON HCL 4 MG/2ML IJ SOLN
4.0000 mg | Freq: Once | INTRAMUSCULAR | Status: AC
  Administered 2011-09-21: 4 mg via INTRAVENOUS
  Filled 2011-09-21: qty 2

## 2011-09-21 MED ORDER — PANTOPRAZOLE SODIUM 40 MG IV SOLR
40.0000 mg | Freq: Once | INTRAVENOUS | Status: AC
  Administered 2011-09-21: 40 mg via INTRAVENOUS
  Filled 2011-09-21: qty 40

## 2011-09-21 MED ORDER — OXYCODONE-ACETAMINOPHEN 5-325 MG PO TABS: 2.0000 | ORAL_TABLET | ORAL | Status: AC | PRN

## 2011-09-21 MED ORDER — SODIUM CHLORIDE 0.9 % IV BOLUS (SEPSIS)
1000.0000 mL | Freq: Once | INTRAVENOUS | Status: AC
  Administered 2011-09-21: 1000 mL via INTRAVENOUS

## 2011-09-21 NOTE — ED Provider Notes (Signed)
History  This chart was scribed for [name], MD by Bennett Scrape. This patient was seen in room APA03/APA03 and the patient's care was started at 7:47PM.  CSN: [id number]  Arrival date & time 09/21/11  1614   First MD Initiated Contact with Patient 09/21/11 1649      Chief Complaint  Patient presents with  . Abdominal Pain  . Emesis  . Diarrhea     The history is provided by the patient. No language interpreter was used.    Hieu Herms Cline is a 56 y.o. male with a h/o kidney transplant who presents to the Emergency Department complaining of 2 weeks of gradual onset, gradually worsening, intermittent periumblicial abdominal pain with associated non-bloody emesis and nausea. He reports several episodes of emesis with his last episode being this morning. Pt denies any modifying factors. Pt reports that he was seen by his nephrologist this morning at [facility] for routine appointment. He reports that he explained his symptoms to his doctor and was treated with IV fluids and given a prescription for antinausea medication. He reports that he has not been to fill the prescription yet. He states that he has been taking his kidney transplant medications as prescribed. He denies fever, sore throat, congestion, cough, visual disturbance, diarrhea, chest pain, HA, dysuria and confusion as associated symptoms. Pt also has a h/o HTN and diabetes. Pt is an occasional alcohol user but denies smoking.  Pt is a frequent visitor to this ED c/o similar symptoms with his last visit being 08/23/11.  Past Medical History  Diagnosis Date  . Renal disorder   . Hypertension   . Depression   . Urinary retention   . Diabetes mellitus     Past Surgical History  Procedure Date  . Kidney transplant 2004  . Hernia repair     Family History  Problem Relation Age of Onset  . Hypertension Sister     History  Substance Use Topics  . Smoking status: Never Smoker   . Smokeless tobacco: Never Used  .  Alcohol Use: 1.2 oz/week    2 Cans of beer per week     occasionally      Review of Systems  A complete 10 system review of systems was obtained and all systems are negative except as noted in the HPI and PMH.   Allergies  Codeine and Vasotec  Home Medications   Current Outpatient Rx  Name Route Sig Dispense Refill  . AMLODIPINE BESYLATE 2.5 MG PO TABS Oral Take 2.5 mg by mouth daily.      . ATORVASTATIN CALCIUM 10 MG PO TABS Oral Take 10 mg by mouth daily.     . CYCLOSPORINE 100 MG PO CAPS Oral Take 100 mg by mouth 2 (two) times daily. Take with 25 mg tablet to equal 125 mg twice daily    . CYCLOSPORINE 25 MG PO CAPS Oral Take 25 mg by mouth 2 (two) times daily. Take with 100 mg tablet to equal dose of 125 mg twice daily    . ESOMEPRAZOLE MAGNESIUM 40 MG PO CPDR Oral Take 40 mg by mouth 2 (two) times daily.     Marland Kitchen PREDNISONE 5 MG PO TABS Oral Take 5 mg by mouth daily.      Marland Kitchen TAMSULOSIN HCL 0.4 MG PO CAPS Oral Take 0.4 mg by mouth daily.       Triage Vitals: BP 124/86  Pulse 84  Temp(Src) 99.8 F (37.7 C) (Oral)  Resp 18  Ht  5\' 11"  (1.803 m)  Wt 182 lb (82.555 kg)  BMI 25.38 kg/m2  SpO2 100%  Physical Exam  Nursing note and vitals reviewed. Constitutional: He is oriented to person, place, and time. He appears well-developed and well-nourished.  HENT:  Head: Normocephalic and atraumatic.  Eyes: Conjunctivae and EOM are normal.  Neck: Normal range of motion. Neck supple.  Cardiovascular: Normal rate and regular rhythm.   Pulmonary/Chest: Effort normal and breath sounds normal.  Abdominal: Soft. There is tenderness (minimal periumbilical tenderness).       Oblique 5 inch scar on LLQ from a kidney transplant is noted  Musculoskeletal: Normal range of motion. He exhibits no edema.  Neurological: He is alert and oriented to person, place, and time.  Skin: Skin is warm and dry.  Psychiatric: He has a normal mood and affect. His behavior is normal.    ED Course    Procedures (including critical care time)  DIAGNOSTIC STUDIES: Oxygen Saturation is 100% on room air, normal by my interpretation.    COORDINATION OF CARE: 7:49PM-Discussed IV fluids, blood work to check kidney function, and urinalysis with pt and pt agreed to plan.  Labs Reviewed  CBC - Abnormal; Notable for the following:    Hemoglobin 12.6 (*)    MCV 69.0 (*)    MCH 22.2 (*)    All other components within normal limits  BASIC METABOLIC PANEL - Abnormal; Notable for the following:    GFR calc non Af Amer 65 (*)    GFR calc Af Amer 76 (*)    All other components within normal limits  URINALYSIS, ROUTINE W REFLEX MICROSCOPIC - Abnormal; Notable for the following:    Ketones, ur 15 (*)    All other components within normal limits  DIFFERENTIAL   Dg Abd Acute W/chest  09/21/2011  *RADIOLOGY REPORT*  Clinical Data: Left lower quadrant abdominal pain; intermittent nausea, vomiting and diarrhea.  ACUTE ABDOMEN SERIES (ABDOMEN 2 VIEW & CHEST 1 VIEW)  Comparison: Chest and abdominal radiograph performed 11/08/2010; CT of the abdomen and pelvis performed 08/23/2011  Findings: The lungs are well-aerated.  Minimal bibasilar opacities likely reflect atelectasis, though pneumonia could conceivably have a similar appearance.  There is no evidence of pleural effusion or pneumothorax.  The cardiomediastinal silhouette is within normal limits.  The visualized bowel gas pattern is unremarkable.  Scattered stool and air are seen within the colon; there is no evidence of small bowel dilatation to suggest obstruction.  No free intra-abdominal air is identified on the provided upright view.  No acute osseous abnormalities are seen; the sacroiliac joints are unremarkable in appearance.  IMPRESSION:  1.  Unremarkable bowel gas pattern; no free intra-abdominal air seen. 2.  Minimal bibasilar airspace opacities likely reflect atelectasis, though pneumonia could conceivably have a similar appearance.  Original Report  Authenticated By: Tonia Ghent, M.D.     No diagnosis found.    MDM  Patient feeling much better after IV fluids. Urinalysis and creatinine were good. Discharge home on medication for pain and nausea    I personally performed the services described in this documentation, which was scribed in my presence. The recorded information has been reviewed and considered.    [name], MD 09/21/11 [phone]

## 2011-09-21 NOTE — Discharge Instructions (Signed)
Medications for pain and nausea. Followup your regular Dr. Urine sample and creatinine were good

## 2011-09-21 NOTE — ED Notes (Addendum)
Pt reports continued pain in lower abdomen.  Denies nausea at this time, report one episode of emesis this morning, but none since.  Denies problems with constipation or diarrhea.

## 2011-09-21 NOTE — ED Notes (Signed)
Pt reports went to his nephrologist this morning  at [facility] this am for routine visit.  Reports explained symptoms to his doctor and was given IVF and had prescription for nausea medicine called into [facility].  Pt says hasn't picked up the medication yet.

## 2011-09-21 NOTE — ED Notes (Signed)
Pt reports generalized abd pain with intermittent n/v/d x 2 weeks.

## 2011-09-21 NOTE — ED Notes (Signed)
Pt reports had blood work this morning and was told was normal.

## 2011-09-21 NOTE — ED Notes (Signed)
IV started, labs drawn, Dr. Adriana Simas at bedside.

## 2011-09-30 DIAGNOSIS — R112 Nausea with vomiting, unspecified: Secondary | ICD-10-CM | POA: Diagnosis not present

## 2011-09-30 DIAGNOSIS — K5730 Diverticulosis of large intestine without perforation or abscess without bleeding: Secondary | ICD-10-CM | POA: Diagnosis not present

## 2011-10-15 ENCOUNTER — Other Ambulatory Visit (HOSPITAL_COMMUNITY): Payer: Self-pay | Admitting: Family Medicine

## 2011-10-15 DIAGNOSIS — M549 Dorsalgia, unspecified: Secondary | ICD-10-CM

## 2011-10-15 DIAGNOSIS — M1990 Unspecified osteoarthritis, unspecified site: Secondary | ICD-10-CM | POA: Diagnosis not present

## 2011-10-15 DIAGNOSIS — M79606 Pain in leg, unspecified: Secondary | ICD-10-CM

## 2011-10-15 DIAGNOSIS — I119 Hypertensive heart disease without heart failure: Secondary | ICD-10-CM | POA: Diagnosis not present

## 2011-10-17 ENCOUNTER — Emergency Department (HOSPITAL_COMMUNITY)
Admission: EM | Admit: 2011-10-17 | Discharge: 2011-10-17 | Disposition: A | Discharge status: Home / Self Care | Payer: Medicare Other | Attending: Emergency Medicine | Admitting: Emergency Medicine

## 2011-10-17 ENCOUNTER — Encounter (HOSPITAL_COMMUNITY): Payer: Self-pay

## 2011-10-17 ENCOUNTER — Emergency Department (HOSPITAL_COMMUNITY): Payer: Medicare Other

## 2011-10-17 DIAGNOSIS — I10 Essential (primary) hypertension: Secondary | ICD-10-CM | POA: Insufficient documentation

## 2011-10-17 DIAGNOSIS — M79609 Pain in unspecified limb: Secondary | ICD-10-CM | POA: Diagnosis not present

## 2011-10-17 DIAGNOSIS — M25559 Pain in unspecified hip: Secondary | ICD-10-CM

## 2011-10-17 DIAGNOSIS — M169 Osteoarthritis of hip, unspecified: Secondary | ICD-10-CM | POA: Diagnosis not present

## 2011-10-17 DIAGNOSIS — M25569 Pain in unspecified knee: Secondary | ICD-10-CM | POA: Insufficient documentation

## 2011-10-17 DIAGNOSIS — E119 Type 2 diabetes mellitus without complications: Secondary | ICD-10-CM | POA: Insufficient documentation

## 2011-10-17 DIAGNOSIS — M25859 Other specified joint disorders, unspecified hip: Secondary | ICD-10-CM | POA: Diagnosis not present

## 2011-10-17 DIAGNOSIS — R1110 Vomiting, unspecified: Secondary | ICD-10-CM | POA: Insufficient documentation

## 2011-10-17 MED ORDER — HYDROMORPHONE HCL PF 1 MG/ML IJ SOLN
1.0000 mg | Freq: Once | INTRAMUSCULAR | Status: AC
  Administered 2011-10-17: 1 mg via INTRAMUSCULAR
  Filled 2011-10-17: qty 1

## 2011-10-17 NOTE — ED Notes (Signed)
Pt reports has had left leg pain for " a while."  Says saw Dr. Janna Arch and was told needed to have an MRI.  Pt also says has had n/v x 1 week.  LBM was this am but was small.

## 2011-10-17 NOTE — ED Notes (Signed)
Pt c/o chronic leg pain and vomiting.

## 2011-10-17 NOTE — Discharge Instructions (Signed)
Follow up with your md and have your mri tomorrow as scheduled

## 2011-10-17 NOTE — ED Provider Notes (Cosign Needed)
History   This chart was scribed for Robert Lennert, MD by [name]. The patient was seen in room APA15/APA15. Patient's care was started at 1017.  CSN: [id number]  Arrival date & time 10/17/11  1017  First MD Initiated Contact with Patient 10/17/11 1319    Chief Complaint  Patient presents with  . Emesis  . Leg Pain   Patient is a 56 y.o. male presenting with vomiting and leg pain. The history is provided by the patient. No language interpreter was used.  Emesis  Pertinent negatives include no abdominal pain, no chills, no cough, no diarrhea and no fever.  Leg Pain    Robert Durham is a 56 y.o. male who presents to the Emergency Department complaining of gradual onset moderate left leg pain originating near hip and radiating downward to left knee onset one week ago with associated symptoms of discomfort standing. Pt has a h/o hypertension, diabetes mellitus, and urinary retention.  Pt lists PCP as Dr. Delbert Harness  Past Medical History  Diagnosis Date  . Renal disorder   . Hypertension   . Depression   . Urinary retention   . Diabetes mellitus    Past Surgical History  Procedure Date  . Kidney transplant   . Hernia repair    Family History  Problem Relation Age of Onset  . Hypertension Sister    History  Substance Use Topics  . Smoking status: Never Smoker   . Smokeless tobacco: Never Used  . Alcohol Use: 1.2 oz/week    2 Cans of beer per week     occasionally    Review of Systems  Constitutional: Negative for fever and chills.  HENT: Negative for rhinorrhea and neck pain.   Eyes: Negative for pain.  Respiratory: Negative for cough and shortness of breath.   Cardiovascular: Negative for chest pain.  Gastrointestinal: Negative for nausea, vomiting, abdominal pain and diarrhea.  Genitourinary: Negative for dysuria.  Musculoskeletal: Negative for back pain.       Left leg pain  Skin: Negative for rash.  Neurological: Negative for dizziness and weakness.     Allergies  Codeine and Vasotec  Home Medications   Current Outpatient Rx  Name Route Sig Dispense Refill  . AMLODIPINE BESYLATE 2.5 MG PO TABS Oral Take 2.5 mg by mouth daily.      . ATORVASTATIN CALCIUM 10 MG PO TABS Oral Take 10 mg by mouth daily.     . CYCLOSPORINE 100 MG PO CAPS Oral Take 125 mg by mouth 2 (two) times daily. Take with 25 mg tablet to equal 125 mg twice daily    . CYCLOSPORINE 25 MG PO CAPS Oral Take 125 mg by mouth 2 (two) times daily. Take with 100 mg tablet to equal dose of 125 mg twice daily    . CYCLOSPORINE MODIFIED 25 MG PO CAPS Oral Take 25 mg by mouth 2 (two) times daily.    Marland Kitchen ESOMEPRAZOLE MAGNESIUM 40 MG PO CPDR Oral Take 40 mg by mouth 2 (two) times daily.     Marland Kitchen PREDNISONE 5 MG PO TABS Oral Take 5 mg by mouth daily.      Marland Kitchen TAMSULOSIN HCL 0.4 MG PO CAPS Oral Take 0.4 mg by mouth daily.       BP 110/79  Pulse 74  Temp(Src) 98.1 F (36.7 C) (Oral)  Resp 18  Ht 5\' 10"  (1.778 m)  Wt 180 lb (81.647 kg)  BMI 25.83 kg/m2  SpO2 100%  Physical Exam  Nursing note and vitals reviewed. Constitutional: He is oriented to person, place, and time. He appears well-developed and well-nourished. No distress.  HENT:  Head: Normocephalic and atraumatic.  Eyes: EOM are normal. Pupils are equal, round, and reactive to light.  Neck: Neck supple. No tracheal deviation present.  Cardiovascular: Normal rate, regular rhythm and normal heart sounds.  Exam reveals no gallop and no friction rub.   No murmur heard. Pulmonary/Chest: Effort normal and breath sounds normal. No respiratory distress. He has no wheezes. He has no rales.  Abdominal: Soft. He exhibits no distension. There is no tenderness.  Musculoskeletal: Normal range of motion. He exhibits no edema.       Tender in right hip  Neurological: He is alert and oriented to person, place, and time. No cranial nerve deficit or sensory deficit. Coordination normal.  Skin: Skin is warm and dry.  Psychiatric: He has a  normal mood and affect. His behavior is normal.    ED Course  Procedures  DIAGNOSTIC STUDIES: Oxygen Saturation is 100% on room air, normal by my interpretation.    COORDINATION OF CARE: 1:26pm- Discussed symptoms and previously scheduled x-rays for hip  Labs Reviewed - No data to display Dg Hip Complete Left  10/17/2011  *RADIOLOGY REPORT*  Clinical Data: 1-week history of left hip pain.  No recent injuries.  LEFT HIP - COMPLETE 2+ VIEW 10/17/2011:  Comparison: Left hip x-rays 02/13/2004.  Findings: No evidence of acute or subacute fracture or dislocation. Near complete loss of the superolateral joint space, with associated hypertrophic spurring involving the roof of the acetabulum and the femoral head, progressive since 2005.  Bone mineral density well preserved.  Included AP pelvis demonstrates moderate axial joint space narrowing in the contralateral right hip, unchanged.  Sacroiliac joints and symphysis pubis intact.  Mild degenerative changes involving the visualized lower lumbar spine.  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  Original Report Authenticated By: Arnell Sieving, M.D.    No diagnosis found.  MDM   The chart was scribed for me under my direct supervision.  I personally performed the history, physical, and medical decision making and all procedures in the evaluation of this patient.. .   Robert Lennert, MD 10/17/11 [phone]

## 2011-10-18 ENCOUNTER — Ambulatory Visit (HOSPITAL_COMMUNITY)
Admission: RE | Admit: 2011-10-18 | Discharge: 2011-10-18 | Disposition: A | Discharge status: Home / Self Care | Payer: Medicare Other | Source: Ambulatory Visit | Attending: Family Medicine | Admitting: Family Medicine

## 2011-10-18 DIAGNOSIS — Z940 Kidney transplant status: Secondary | ICD-10-CM | POA: Diagnosis not present

## 2011-10-18 DIAGNOSIS — M79606 Pain in leg, unspecified: Secondary | ICD-10-CM

## 2011-10-18 DIAGNOSIS — M545 Low back pain, unspecified: Secondary | ICD-10-CM | POA: Diagnosis not present

## 2011-10-18 DIAGNOSIS — M549 Dorsalgia, unspecified: Secondary | ICD-10-CM

## 2011-10-18 DIAGNOSIS — M5126 Other intervertebral disc displacement, lumbar region: Secondary | ICD-10-CM | POA: Diagnosis not present

## 2011-10-18 DIAGNOSIS — M47817 Spondylosis without myelopathy or radiculopathy, lumbosacral region: Secondary | ICD-10-CM | POA: Diagnosis not present

## 2011-10-18 DIAGNOSIS — M5137 Other intervertebral disc degeneration, lumbosacral region: Secondary | ICD-10-CM | POA: Diagnosis not present

## 2011-10-27 ENCOUNTER — Encounter (HOSPITAL_COMMUNITY): Payer: Self-pay

## 2011-10-27 ENCOUNTER — Other Ambulatory Visit: Payer: Self-pay

## 2011-10-27 ENCOUNTER — Emergency Department (HOSPITAL_COMMUNITY)
Admission: EM | Admit: 2011-10-27 | Discharge: 2011-10-27 | Disposition: A | Discharge status: Home / Self Care | Payer: Medicare Other | Attending: Emergency Medicine | Admitting: Emergency Medicine

## 2011-10-27 DIAGNOSIS — I10 Essential (primary) hypertension: Secondary | ICD-10-CM | POA: Diagnosis not present

## 2011-10-27 DIAGNOSIS — R112 Nausea with vomiting, unspecified: Secondary | ICD-10-CM | POA: Diagnosis not present

## 2011-10-27 DIAGNOSIS — Z940 Kidney transplant status: Secondary | ICD-10-CM | POA: Insufficient documentation

## 2011-10-27 DIAGNOSIS — F3289 Other specified depressive episodes: Secondary | ICD-10-CM | POA: Insufficient documentation

## 2011-10-27 DIAGNOSIS — E119 Type 2 diabetes mellitus without complications: Secondary | ICD-10-CM | POA: Diagnosis not present

## 2011-10-27 DIAGNOSIS — F329 Major depressive disorder, single episode, unspecified: Secondary | ICD-10-CM | POA: Diagnosis not present

## 2011-10-27 DIAGNOSIS — N289 Disorder of kidney and ureter, unspecified: Secondary | ICD-10-CM | POA: Diagnosis not present

## 2011-10-27 LAB — COMPREHENSIVE METABOLIC PANEL
ALT: 9 U/L (ref 0–53)
AST: 27 U/L (ref 0–37)
Alkaline Phosphatase: 77 U/L (ref 39–117)
CO2: 26 mEq/L (ref 19–32)
Chloride: 106 mEq/L (ref 96–112)
GFR calc non Af Amer: 51 mL/min — ABNORMAL LOW (ref >90)
Sodium: 141 mEq/L (ref 135–145)
Total Bilirubin: 0.2 mg/dL — ABNORMAL LOW (ref 0.3–1.2)

## 2011-10-27 MED ORDER — METOCLOPRAMIDE HCL 10 MG PO TABS: 10.0000 mg | ORAL_TABLET | Freq: Four times a day (QID) | ORAL | Status: DC

## 2011-10-27 MED ORDER — ONDANSETRON HCL 4 MG/2ML IJ SOLN
4.0000 mg | Freq: Once | INTRAMUSCULAR | Status: AC
  Administered 2011-10-27: 4 mg via INTRAVENOUS
  Filled 2011-10-27: qty 2

## 2011-10-27 NOTE — ED Notes (Signed)
Patient is comfortable at this time. 

## 2011-10-27 NOTE — ED Provider Notes (Signed)
History     CSN: [id number]  Arrival date & time 10/27/11  4098   First MD Initiated Contact with Patient 10/27/11 0809      Chief Complaint  Patient presents with  . Emesis    (Consider location/radiation/quality/duration/timing/severity/associated sxs/prior treatment) HPI Comments: Daiveon Markman Ybarra since for evaluation of sharp pain along his bilateral jawline, nausea and emesis x1 morning shortly after awaking at 6:30 AM.  He denies fevers, chills, chest pain, shortness of breath or diaphoresis.  He does have a history of chronic nausea and emesis for which she has been seen by GI at [facility] with what he reports as an extensive workup without finding.  He does have a history of diabetes and renal disease with renal transplant.  He denies any urinary changes in his blood sugars have been under fair control.  His last bowel movement was this morning and was normal.  He also denies any dental problems.  Patient is a 56 y.o. male presenting with vomiting. The history is provided by the patient.  Emesis  This is a chronic problem. The problem has been resolved. There has been no fever. Pertinent negatives include no abdominal pain, no arthralgias, no chills, no cough, no diarrhea, no fever, no headaches and no sweats.    Past Medical History  Diagnosis Date  . Renal disorder   . Hypertension   . Depression   . Urinary retention   . Diabetes mellitus     Past Surgical History  Procedure Date  . Kidney transplant   . Hernia repair     Family History  Problem Relation Age of Onset  . Hypertension Sister     History  Substance Use Topics  . Smoking status: Never Smoker   . Smokeless tobacco: Never Used  . Alcohol Use: 1.2 oz/week    2 Cans of beer per week     occasionally      Review of Systems  Constitutional: Negative for fever and chills.  HENT: Negative for congestion, sore throat, rhinorrhea, trouble swallowing, neck pain, dental problem and sinus pressure.     Eyes: Negative.   Respiratory: Negative for cough, chest tightness and shortness of breath.   Cardiovascular: Negative for chest pain and palpitations.  Gastrointestinal: Positive for nausea and vomiting. Negative for abdominal pain, diarrhea and constipation.  Genitourinary: Negative.   Musculoskeletal: Negative for joint swelling and arthralgias.  Skin: Negative.  Negative for rash and wound.  Neurological: Negative for dizziness, weakness, light-headedness, numbness and headaches.  Hematological: Negative.   Psychiatric/Behavioral: Negative.     Allergies  Codeine and Vasotec  Home Medications   Current Outpatient Rx  Name Route Sig Dispense Refill  . AMLODIPINE BESYLATE 2.5 MG PO TABS Oral Take 2.5 mg by mouth daily.      . ATORVASTATIN CALCIUM 10 MG PO TABS Oral Take 10 mg by mouth daily.     . CYCLOSPORINE (SANDIMMUNE) 100 MG PO CAPS Oral Take 125 mg by mouth 2 (two) times daily. Take with 25 mg tablet to equal 125 mg twice daily    . CYCLOSPORINE (SANDIMMUNE) 25 MG PO CAPS Oral Take 125 mg by mouth 2 (two) times daily. Take with 100 mg tablet to equal dose of 125 mg twice daily    . CYCLOSPORINE MODIFIED (NEORAL) 25 MG PO CAPS Oral Take 25 mg by mouth 2 (two) times daily.    Marland Kitchen ESOMEPRAZOLE MAGNESIUM 40 MG PO CPDR Oral Take 40 mg by mouth 2 (two) times daily.     Marland Kitchen  PREDNISONE 5 MG PO TABS Oral Take 5 mg by mouth daily.      Marland Kitchen TAMSULOSIN HCL 0.4 MG PO CAPS Oral Take 0.4 mg by mouth daily.       BP 130/81  Pulse 55  Temp(Src) 98.8 F (37.1 C) (Oral)  Resp 16  Ht 5\' 10"  (1.778 m)  Wt 180 lb (81.647 kg)  BMI 25.83 kg/m2  SpO2 100%  Physical Exam  Nursing note and vitals reviewed. Constitutional: He appears well-developed and well-nourished.  HENT:  Head: Normocephalic and atraumatic.  Right Ear: Tympanic membrane and ear canal normal.  Left Ear: Tympanic membrane and ear canal normal.  Nose: Nose normal.  Mouth/Throat: Uvula is midline, oropharynx is clear and moist  and mucous membranes are normal.  Eyes: Conjunctivae are normal.  Neck: Normal range of motion.  Cardiovascular: Normal rate, regular rhythm, normal heart sounds and intact distal pulses.   Pulmonary/Chest: Effort normal and breath sounds normal. He has no wheezes.  Abdominal: Soft. Bowel sounds are normal. There is no tenderness.  Musculoskeletal: Normal range of motion.  Neurological: He is alert.  Skin: Skin is warm and dry.  Psychiatric: He has a normal mood and affect.    ED Course  Procedures (including critical care time)  Labs Reviewed  COMPREHENSIVE METABOLIC PANEL - Abnormal; Notable for the following:    Creatinine, Ser 1.49 (*)    Total Bilirubin 0.2 (*)    GFR calc non Af Amer 51 (*)    GFR calc Af Amer 59 (*)    All other components within normal limits  LIPASE, BLOOD  POCT I-STAT TROPONIN I   No results found.   No diagnosis found.   Date: 10/27/2011  Rate: 56  Rhythm: sinus bradycardia  QRS Axis: normal  Intervals: normal  ST/T Wave abnormalities: normal  Conduction Disutrbances:none  Narrative Interpretation: had QT prolongation on ecg dated 10/26/10  Old EKG Reviewed: changes noted    MDM  Chronic intermittent nausea, question whether this could be gastroparesis related given history of diabetes.  Patient has taken promethazine recently which she states did not help his symptoms.  Will try a course of Reglan to see if this improves his symptoms.  Normal troponin and normal EKG reassuring that this is not an atypical cardiac event.  Jaw pain is resolved at this time and was nearly resolved at the time of presentation.  Encouraged close followup with PCP.   Patient was seen by Dr. Estell Harpin prior to discharge home.        Burgess Amor, PA 10/27/11 1006  Burgess Amor, [place] 10/27/11 1007

## 2011-10-27 NOTE — Discharge Instructions (Signed)
Nausea and Vomiting Nausea is a sick feeling that often comes before throwing up (vomiting). Vomiting is a reflex where stomach contents come out of your mouth. Vomiting can cause severe loss of body fluids (dehydration). Children and elderly adults can become dehydrated quickly, especially if they also have diarrhea. Nausea and vomiting are symptoms of a condition or disease. It is important to find the cause of your symptoms. CAUSES   Direct irritation of the stomach lining. This irritation can result from increased acid production (gastroesophageal reflux disease), infection, food poisoning, taking certain medicines (such as nonsteroidal anti-inflammatory drugs), alcohol use, or tobacco use.   Signals from the brain.These signals could be caused by a headache, heat exposure, an inner ear disturbance, increased pressure in the brain from injury, infection, a tumor, or a concussion, pain, emotional stimulus, or metabolic problems.   An obstruction in the gastrointestinal tract (bowel obstruction).   Illnesses such as diabetes, hepatitis, gallbladder problems, appendicitis, kidney problems, cancer, sepsis, atypical symptoms of a heart attack, or eating disorders.   Medical treatments such as chemotherapy and radiation.   Receiving medicine that makes you sleep (general anesthetic) during surgery.  DIAGNOSIS Your caregiver may ask for tests to be done if the problems do not improve after a few days. Tests may also be done if symptoms are severe or if the reason for the nausea and vomiting is not clear. Tests may include:  Urine tests.   Blood tests.   Stool tests.   Cultures (to look for evidence of infection).   X-rays or other imaging studies.  Test results can help your caregiver make decisions about treatment or the need for additional tests. TREATMENT You need to stay well hydrated. Drink frequently but in small amounts.You may wish to drink water, sports drinks, clear broth, or  eat frozen ice pops or gelatin dessert to help stay hydrated.When you eat, eating slowly may help prevent nausea.There are also some antinausea medicines that may help prevent nausea. HOME CARE INSTRUCTIONS   Take all medicine as directed by your caregiver.   If you do not have an appetite, do not force yourself to eat. However, you must continue to drink fluids.   If you have an appetite, eat a normal diet unless your caregiver tells you differently.   Eat a variety of complex carbohydrates (rice, wheat, potatoes, bread), lean meats, yogurt, fruits, and vegetables.   Avoid high-fat foods because they are more difficult to digest.   Drink enough water and fluids to keep your urine clear or pale yellow.   If you are dehydrated, ask your caregiver for specific rehydration instructions. Signs of dehydration may include:   Severe thirst.   Dry lips and mouth.   Dizziness.   Dark urine.   Decreasing urine frequency and amount.   Confusion.   Rapid breathing or pulse.  SEEK IMMEDIATE MEDICAL CARE IF:   You have blood or brown flecks (like coffee grounds) in your vomit.   You have black or bloody stools.   You have a severe headache or stiff neck.   You are confused.   You have severe abdominal pain.   You have chest pain or trouble breathing.   You do not urinate at least once every 8 hours.   You develop cold or clammy skin.   You continue to vomit for longer than 24 to 48 hours.   You have a fever.  MAKE SURE YOU:   Understand these instructions.   Will watch your  condition.   Will get help right away if you are not doing well or get worse.  Document Released: 05/31/2005 Document Revised: 05/20/2011 Document Reviewed: 10/28/2010 [facility] Patient Information 2012 [place], [place].   Get the Reglan prescription filled and take for nausea.  As discussed you should have your creatinine level rechecked which was slightly elevated today at 1.46.  Please call Dr.  Loleta Chance for a lab visit within the next 5 days to recheck this level.

## 2011-10-27 NOTE — ED Notes (Signed)
Patient states he does not need anything at this time. 

## 2011-10-27 NOTE — ED Notes (Signed)
Pt reports woke up at 0630 with headache, vomiting, and pain under jaws.

## 2011-10-27 NOTE — ED Provider Notes (Signed)
Medical screening examination/treatment/procedure(s) were performed by non-physician practitioner and as supervising physician I was immediately available for consultation/collaboration.   Orine Goga L Nadalyn Deringer, MD 10/27/11 1456 

## 2011-11-03 DIAGNOSIS — Z940 Kidney transplant status: Secondary | ICD-10-CM | POA: Diagnosis not present

## 2011-11-04 DIAGNOSIS — N182 Chronic kidney disease, stage 2 (mild): Secondary | ICD-10-CM | POA: Diagnosis not present

## 2011-11-04 DIAGNOSIS — N400 Enlarged prostate without lower urinary tract symptoms: Secondary | ICD-10-CM | POA: Diagnosis not present

## 2011-11-04 DIAGNOSIS — I10 Essential (primary) hypertension: Secondary | ICD-10-CM | POA: Diagnosis not present

## 2011-11-04 DIAGNOSIS — E785 Hyperlipidemia, unspecified: Secondary | ICD-10-CM | POA: Diagnosis not present

## 2011-11-04 DIAGNOSIS — E212 Other hyperparathyroidism: Secondary | ICD-10-CM | POA: Diagnosis not present

## 2011-11-04 DIAGNOSIS — N178 Other acute kidney failure: Secondary | ICD-10-CM | POA: Diagnosis not present

## 2011-11-08 ENCOUNTER — Encounter (HOSPITAL_COMMUNITY): Payer: Self-pay | Admitting: *Deleted

## 2011-11-08 ENCOUNTER — Emergency Department (HOSPITAL_COMMUNITY)
Admission: EM | Admit: 2011-11-08 | Discharge: 2011-11-08 | Disposition: A | Discharge status: Home / Self Care | Payer: Medicare Other | Attending: Emergency Medicine | Admitting: Emergency Medicine

## 2011-11-08 DIAGNOSIS — R112 Nausea with vomiting, unspecified: Secondary | ICD-10-CM | POA: Diagnosis not present

## 2011-11-08 DIAGNOSIS — R197 Diarrhea, unspecified: Secondary | ICD-10-CM | POA: Diagnosis not present

## 2011-11-08 DIAGNOSIS — R1033 Periumbilical pain: Secondary | ICD-10-CM | POA: Insufficient documentation

## 2011-11-08 DIAGNOSIS — R1032 Left lower quadrant pain: Secondary | ICD-10-CM | POA: Diagnosis not present

## 2011-11-08 DIAGNOSIS — I10 Essential (primary) hypertension: Secondary | ICD-10-CM | POA: Insufficient documentation

## 2011-11-08 DIAGNOSIS — E119 Type 2 diabetes mellitus without complications: Secondary | ICD-10-CM | POA: Insufficient documentation

## 2011-11-08 DIAGNOSIS — G8929 Other chronic pain: Secondary | ICD-10-CM

## 2011-11-08 DIAGNOSIS — Z940 Kidney transplant status: Secondary | ICD-10-CM | POA: Insufficient documentation

## 2011-11-08 DIAGNOSIS — F3289 Other specified depressive episodes: Secondary | ICD-10-CM | POA: Insufficient documentation

## 2011-11-08 DIAGNOSIS — F329 Major depressive disorder, single episode, unspecified: Secondary | ICD-10-CM | POA: Insufficient documentation

## 2011-11-08 DIAGNOSIS — F1210 Cannabis abuse, uncomplicated: Secondary | ICD-10-CM | POA: Insufficient documentation

## 2011-11-08 DIAGNOSIS — R1110 Vomiting, unspecified: Secondary | ICD-10-CM

## 2011-11-08 LAB — RAPID URINE DRUG SCREEN, HOSP PERFORMED
Amphetamines: NOT DETECTED
Barbiturates: NOT DETECTED
Benzodiazepines: NOT DETECTED
Tetrahydrocannabinol: POSITIVE — AB

## 2011-11-08 LAB — LIPASE, BLOOD: Lipase: 34 U/L (ref 11–59)

## 2011-11-08 LAB — DIFFERENTIAL
Basophils Absolute: 0 10*3/uL (ref 0.0–0.1)
Basophils Relative: 0 % (ref 0–1)
Lymphocytes Relative: 13 % (ref 12–46)
Monocytes Absolute: 0.6 10*3/uL (ref 0.1–1.0)
Monocytes Relative: 7 % (ref 3–12)
Neutro Abs: 6.4 10*3/uL (ref 1.7–7.7)
Neutrophils Relative %: 79 % — ABNORMAL HIGH (ref 43–77)

## 2011-11-08 LAB — CBC
HCT: 41.6 % (ref 39.0–52.0)
Hemoglobin: 13.5 g/dL (ref 13.0–17.0)
MCHC: 32.5 g/dL (ref 30.0–36.0)
WBC: 8.1 10*3/uL (ref 4.0–10.5)

## 2011-11-08 LAB — URINALYSIS, ROUTINE W REFLEX MICROSCOPIC
Bilirubin Urine: NEGATIVE
Glucose, UA: NEGATIVE mg/dL
Hgb urine dipstick: NEGATIVE
Ketones, ur: NEGATIVE mg/dL
Protein, ur: NEGATIVE mg/dL
pH: 6.5 (ref 5.0–8.0)

## 2011-11-08 LAB — COMPREHENSIVE METABOLIC PANEL
ALT: 12 U/L (ref 0–53)
Albumin: 3.7 g/dL (ref 3.5–5.2)
Alkaline Phosphatase: 76 U/L (ref 39–117)
BUN: 14 mg/dL (ref 6–23)
Chloride: 101 mEq/L (ref 96–112)
Glucose, Bld: 90 mg/dL (ref 70–99)
Potassium: 3.9 mEq/L (ref 3.5–5.1)
Sodium: 134 mEq/L — ABNORMAL LOW (ref 135–145)
Total Bilirubin: 0.4 mg/dL (ref 0.3–1.2)
Total Protein: 7.9 g/dL (ref 6.0–8.3)

## 2011-11-08 MED ORDER — FENTANYL CITRATE 0.05 MG/ML IJ SOLN
50.0000 ug | Freq: Once | INTRAMUSCULAR | Status: AC
  Administered 2011-11-08: 50 ug via INTRAVENOUS
  Filled 2011-11-08: qty 2

## 2011-11-08 MED ORDER — SODIUM CHLORIDE 0.9 % IV SOLN: INTRAVENOUS | Status: DC

## 2011-11-08 MED ORDER — PROMETHAZINE HCL 25 MG PO TABS: 25.0000 mg | ORAL_TABLET | Freq: Four times a day (QID) | ORAL | Status: DC | PRN

## 2011-11-08 MED ORDER — SODIUM CHLORIDE 0.9 % IV BOLUS (SEPSIS)
1000.0000 mL | Freq: Once | INTRAVENOUS | Status: AC
  Administered 2011-11-08: 1000 mL via INTRAVENOUS

## 2011-11-08 MED ORDER — METOCLOPRAMIDE HCL 5 MG/ML IJ SOLN
10.0000 mg | Freq: Once | INTRAMUSCULAR | Status: AC
  Administered 2011-11-08: 10 mg via INTRAVENOUS
  Filled 2011-11-08: qty 2

## 2011-11-08 MED ORDER — DIPHENHYDRAMINE HCL 50 MG/ML IJ SOLN
50.0000 mg | Freq: Once | INTRAMUSCULAR | Status: AC
  Administered 2011-11-08: 50 mg via INTRAVENOUS
  Filled 2011-11-08: qty 1

## 2011-11-08 NOTE — Discharge Instructions (Signed)
Drink plenty of fluids (clear liquids) the next 12-24 hours then start the BRAT diet. . Use the phenergan for nausea or vomiting. Take imodium OTC for diarrhea. Avoid mild products until the diarrhea is gone. Have Dr Loleta Chance recheck you this week. You may need to see the gastroenterologist at [facility] sooner than September.

## 2011-11-08 NOTE — ED Notes (Signed)
abd pain,,vomiting, diarrhea. For 2 days

## 2011-11-08 NOTE — ED Provider Notes (Signed)
History   This chart was scribed for Ward Givens, MD by [name]. The patient was seen in room APA08/APA08. Patient's care was started at 1358.  CSN: [id number]  Arrival date & time 11/08/11  1358  First MD Initiated Contact with Patient 11/08/11 1733      Chief Complaint  Patient presents with  . Abdominal Pain   HPI  Robert Durham is a 56 y.o. male who presents to the Emergency Department complaining of gradual onset constant mild severe ldiffuse abdominal pain but worse around the umbilicus onset 2 days ago with associated nausea, diarrhea (4-5 times daily), and emesis (2-3 times), denying dizziness. Pt states that he has been having constant abdominal pain for a while, however the severity of his abdominal pain has recently increased and that standin provides relief. Pt is a recurrent Pt at [facility] ED and list a h/o renal disorder, Urinary retention, Kidney transplant, and Nephrectomy transplanted organ. Pt denies the use of illicit recreational drug use and admitting to the use of alcohol of which he states his last drink was 1 month ago.   PCP  Dr. Mirna Mires in [place], [place]  Past Medical History  Diagnosis Date  . Renal disorder   . Hypertension   . Depression   . Urinary retention   . Diabetes mellitus    Past Surgical History  Procedure Date  . Kidney transplant   . Hernia repair   . Nephrectomy transplanted organ    Family History  Problem Relation Age of Onset  . Hypertension Sister    History  Substance Use Topics  . Smoking status: Never Smoker   . Smokeless tobacco: Never Used  . Alcohol Use: 1.2 oz/week    2 Cans of beer per week     occasionally    Review of Systems  All other systems reviewed and are negative.    Allergies  Codeine and Vasotec  Home Medications   Current Outpatient Rx  Name Route Sig Dispense Refill  . AMLODIPINE BESYLATE 2.5 MG PO TABS Oral Take 2.5 mg by mouth every morning.     . ATORVASTATIN CALCIUM 10 MG PO  TABS Oral Take 10 mg by mouth every morning.     . CYCLOSPORINE 100 MG PO CAPS Oral Take 125 mg by mouth 2 (two) times daily. Take with 25 mg tablet to equal 125 mg twice daily    . CYCLOSPORINE 25 MG PO CAPS Oral Take 125 mg by mouth 2 (two) times daily. Take with 100 mg tablet to equal dose of 125 mg twice daily    . ESOMEPRAZOLE MAGNESIUM 40 MG PO CPDR Oral Take 40 mg by mouth 2 (two) times daily.     Marland Kitchen PREDNISONE 5 MG PO TABS Oral Take 5 mg by mouth every morning.     Marland Kitchen TAMSULOSIN HCL 0.4 MG PO CAPS Oral Take 0.4 mg by mouth every morning.     Marland Kitchen METOCLOPRAMIDE HCL 10 MG PO TABS Oral Take 1 tablet (10 mg total) by mouth every 6 (six) hours. 30 tablet 0    BP 115/88  Pulse 68  Temp(Src) 98.1 F (36.7 C) (Oral)  Resp 18  Ht 5\' 11"  (1.803 m)  Wt 180 lb (81.647 kg)  BMI 25.10 kg/m2  SpO2 100%  Vital signs normal    Physical Exam  Constitutional: He is oriented to person, place, and time. He appears well-developed and well-nourished.  Non-toxic appearance. He does not appear ill. No distress.  HENT:  Head: Normocephalic and atraumatic.  Right Ear: External ear normal.  Left Ear: External ear normal.  Nose: Nose normal. No mucosal edema or rhinorrhea.  Mouth/Throat: Mucous membranes are normal. No dental abscesses or uvula swelling.       Tongue dry   Eyes: Conjunctivae and EOM are normal. Pupils are equal, round, and reactive to light.  Neck: Normal range of motion and full passive range of motion without pain. Neck supple.  Cardiovascular: Normal rate, regular rhythm and normal heart sounds.  Exam reveals no gallop and no friction rub.   No murmur heard. Pulmonary/Chest: Effort normal and breath sounds normal. No respiratory distress. He has no wheezes. He has no rhonchi. He has no rales. He exhibits no tenderness and no crepitus.  Abdominal: Soft. Normal appearance and bowel sounds are normal. He exhibits no distension. There is tenderness. There is no rebound and no guarding.        Kidney transplant in the LLQ, has mild tenderness around the umbilicus   Musculoskeletal: Normal range of motion. He exhibits no edema and no tenderness.       Moves all extremities well.   Neurological: He is alert and oriented to person, place, and time. He has normal strength. No cranial nerve deficit.  Skin: Skin is warm, dry and intact. No rash noted. No erythema. No pallor.  Psychiatric: He has a normal mood and affect. His speech is normal and behavior is normal. His mood appears not anxious.    ED Course  Procedures (including critical care time)  Pt had AP CT done 08/23/11 without acute abnormality.   Medications  0.9 %  sodium chloride infusion (0  Intravenous Stopped 11/08/11 1915)  promethazine (PHENERGAN) 25 MG tablet (not administered)  sodium chloride 0.9 % bolus 1,000 mL (1000 mL Intravenous Given 11/08/11 1821)  metoCLOPramide (REGLAN) injection 10 mg (10 mg Intravenous Given 11/08/11 1821)  diphenhydrAMINE (BENADRYL) injection 50 mg (50 mg Intravenous Given 11/08/11 1821)  fentaNYL (SUBLIMAZE) injection 50 mcg (50 mcg Intravenous Given 11/08/11 2008)     DIAGNOSTIC STUDIES: Oxygen Saturation is 100% on room air, normal by my interpretation.    COORDINATION OF CARE: 5:47PM- Evaluated severity of Pt's current illness, ordered IV fluids and nausea medication. 8:26PM- Revaluated Pt. Pt is feeling better. Watching TV in NAD.    Results for orders placed during the hospital encounter of 11/08/11  CBC      Component Value Range   WBC 8.1  4.0 - 10.5 (K/uL)   RBC 6.07 (*) 4.22 - 5.81 (MIL/uL)   Hemoglobin 13.5  13.0 - 17.0 (g/dL)   HCT 13.0  86.5 - 78.4 (%)   MCV 68.5 (*) 78.0 - 100.0 (fL)   MCH 22.2 (*) 26.0 - 34.0 (pg)   MCHC 32.5  30.0 - 36.0 (g/dL)   RDW 69.6  29.5 - 28.4 (%)   Platelets 120 (*) 150 - 400 (K/uL)  DIFFERENTIAL      Component Value Range   Neutrophils Relative 79 (*) 43 - 77 (%)   Neutro Abs 6.4  1.7 - 7.7 (K/uL)   Lymphocytes Relative 13  12  - 46 (%)   Lymphs Abs 1.0  0.7 - 4.0 (K/uL)   Monocytes Relative 7  3 - 12 (%)   Monocytes Absolute 0.6  0.1 - 1.0 (K/uL)   Eosinophils Relative 1  0 - 5 (%)   Eosinophils Absolute 0.1  0.0 - 0.7 (K/uL)   Basophils Relative 0  0 -  1 (%)   Basophils Absolute 0.0  0.0 - 0.1 (K/uL)   RBC Morphology MICROCYTES    COMPREHENSIVE METABOLIC PANEL      Component Value Range   Sodium 134 (*) 135 - 145 (mEq/L)   Potassium 3.9  3.5 - 5.1 (mEq/L)   Chloride 101  96 - 112 (mEq/L)   CO2 24  19 - 32 (mEq/L)   Glucose, Bld 90  70 - 99 (mg/dL)   BUN 14  6 - 23 (mg/dL)   Creatinine, Ser 1.61  0.50 - 1.35 (mg/dL)   Calcium 9.5  8.4 - 09.6 (mg/dL)   Total Protein 7.9  6.0 - 8.3 (g/dL)   Albumin 3.7  3.5 - 5.2 (g/dL)   AST 21  0 - 37 (U/L)   ALT 12  0 - 53 (U/L)   Alkaline Phosphatase 76  39 - 117 (U/L)   Total Bilirubin 0.4  0.3 - 1.2 (mg/dL)   GFR calc non Af Amer 62 (*) >90 (mL/min)   GFR calc Af Amer 71 (*) >90 (mL/min)  URINALYSIS, ROUTINE W REFLEX MICROSCOPIC      Component Value Range   Color, Urine YELLOW  YELLOW    APPearance CLEAR  CLEAR    Specific Gravity, Urine 1.015  1.005 - 1.030    pH 6.5  5.0 - 8.0    Glucose, UA NEGATIVE  NEGATIVE (mg/dL)   Hgb urine dipstick NEGATIVE  NEGATIVE    Bilirubin Urine NEGATIVE  NEGATIVE    Ketones, ur NEGATIVE  NEGATIVE (mg/dL)   Protein, ur NEGATIVE  NEGATIVE (mg/dL)   Urobilinogen, UA 0.2  0.0 - 1.0 (mg/dL)   Nitrite NEGATIVE  NEGATIVE    Leukocytes, UA NEGATIVE  NEGATIVE   URINE RAPID DRUG SCREEN (HOSP PERFORMED)      Component Value Range   Opiates NONE DETECTED  NONE DETECTED    Cocaine NONE DETECTED  NONE DETECTED    Benzodiazepines NONE DETECTED  NONE DETECTED    Amphetamines NONE DETECTED  NONE DETECTED    Tetrahydrocannabinol POSITIVE (*) NONE DETECTED    Barbiturates NONE DETECTED  NONE DETECTED   LIPASE, BLOOD      Component Value Range   Lipase 34  11 - 59 (U/L)   Laboratory interpretation all normal    1. Chronic abdominal  pain   2. Vomiting and diarrhea    New Prescriptions   PROMETHAZINE (PHENERGAN) 25 MG TABLET    Take 1 tablet (25 mg total) by mouth every 6 (six) hours as needed for nausea.    Plan discharge  Devoria Albe, MD, FACEP   MDM   I personally performed the services described in this documentation, which was scribed in my presence. The recorded information has been reviewed and considered. Devoria Albe, MD, Armando Gang    Ward Givens, MD 11/08/11 2052

## 2011-11-17 DIAGNOSIS — Z940 Kidney transplant status: Secondary | ICD-10-CM | POA: Diagnosis not present

## 2011-11-17 DIAGNOSIS — I10 Essential (primary) hypertension: Secondary | ICD-10-CM | POA: Diagnosis not present

## 2011-11-30 ENCOUNTER — Encounter (HOSPITAL_COMMUNITY): Payer: Self-pay | Admitting: *Deleted

## 2011-11-30 ENCOUNTER — Emergency Department (HOSPITAL_COMMUNITY)
Admission: EM | Admit: 2011-11-30 | Discharge: 2011-11-30 | Disposition: A | Discharge status: Home / Self Care | Payer: Medicare Other | Attending: Emergency Medicine | Admitting: Emergency Medicine

## 2011-11-30 DIAGNOSIS — E119 Type 2 diabetes mellitus without complications: Secondary | ICD-10-CM | POA: Insufficient documentation

## 2011-11-30 DIAGNOSIS — W57XXXA Bitten or stung by nonvenomous insect and other nonvenomous arthropods, initial encounter: Secondary | ICD-10-CM | POA: Diagnosis not present

## 2011-11-30 DIAGNOSIS — I10 Essential (primary) hypertension: Secondary | ICD-10-CM | POA: Insufficient documentation

## 2011-11-30 DIAGNOSIS — S90569A Insect bite (nonvenomous), unspecified ankle, initial encounter: Secondary | ICD-10-CM | POA: Insufficient documentation

## 2011-11-30 MED ORDER — DOXYCYCLINE HYCLATE 100 MG PO CAPS: 100.0000 mg | ORAL_CAPSULE | Freq: Two times a day (BID) | ORAL | Status: AC

## 2011-11-30 NOTE — ED Provider Notes (Signed)
History     CSN: [id number]  Arrival date & time 11/30/11  1854   First MD Initiated Contact with Patient 11/30/11 1921      Chief Complaint  Patient presents with  . Tick Removal    (Consider location/radiation/quality/duration/timing/severity/associated sxs/prior treatment) HPI Comments: Patient c/o tick bite to his left hip.  C/o itching to the site.  States he is unsure how long the tick has been present.  Has not tried to remove it.  Denies fever, joint pain or rash.    The history is provided by the patient.    Past Medical History  Diagnosis Date  . Renal disorder   . Hypertension   . Depression   . Urinary retention   . Diabetes mellitus     Past Surgical History  Procedure Date  . Kidney transplant   . Hernia repair   . Nephrectomy transplanted organ     Family History  Problem Relation Age of Onset  . Hypertension Sister     History  Substance Use Topics  . Smoking status: Never Smoker   . Smokeless tobacco: Never Used  . Alcohol Use: 1.2 oz/week    2 Cans of beer per week     occasionally      Review of Systems  Constitutional: Negative for fever, activity change and appetite change.  Skin:       Tick bite  Neurological: Negative for dizziness, weakness, numbness and headaches.  Hematological: Does not bruise/bleed easily.  All other systems reviewed and are negative.    Allergies  Codeine and Vasotec  Home Medications   Current Outpatient Rx  Name Route Sig Dispense Refill  . AMLODIPINE BESYLATE 2.5 MG PO TABS Oral Take 2.5 mg by mouth every morning.     . ATORVASTATIN CALCIUM 10 MG PO TABS Oral Take 10 mg by mouth every morning.     . CYCLOSPORINE 100 MG PO CAPS Oral Take 125 mg by mouth 2 (two) times daily. Take with 25 mg tablet to equal 125 mg twice daily    . CYCLOSPORINE 25 MG PO CAPS Oral Take 125 mg by mouth 2 (two) times daily. Take with 100 mg tablet to equal dose of 125 mg twice daily    . ESOMEPRAZOLE MAGNESIUM 40 MG PO  CPDR Oral Take 40 mg by mouth 2 (two) times daily.     Marland Kitchen METOCLOPRAMIDE HCL 10 MG PO TABS Oral Take 1 tablet (10 mg total) by mouth every 6 (six) hours. 30 tablet 0  . PREDNISONE 5 MG PO TABS Oral Take 5 mg by mouth every morning.     Marland Kitchen PROMETHAZINE HCL 25 MG PO TABS Oral Take 1 tablet (25 mg total) by mouth every 6 (six) hours as needed for nausea. 10 tablet 0  . TAMSULOSIN HCL 0.4 MG PO CAPS Oral Take 0.4 mg by mouth every morning.       BP 117/79  Pulse 75  Temp 98.2 F (36.8 C) (Oral)  Resp 20  Ht 5\' 11"  (1.803 m)  Wt 182 lb (82.555 kg)  BMI 25.38 kg/m2  SpO2 97%  Physical Exam  Nursing note and vitals reviewed. Constitutional: He is oriented to person, place, and time. He appears well-developed and well-nourished.  HENT:  Head: Normocephalic and atraumatic.  Cardiovascular: Normal rate, regular rhythm and normal heart sounds.   Pulmonary/Chest: Effort normal and breath sounds normal.  Musculoskeletal: He exhibits no edema and no tenderness.  Neurological: He is alert and oriented to  person, place, and time. He exhibits normal muscle tone. Coordination normal.  Skin:       Tick embedded in skin of the lateral left hip.  Mild surrounding erythema.  No central clearing, edema or rash present.      ED Course  Procedures (including critical care time)  Labs Reviewed - No data to display      MDM    Tick removed by me from the lateral left hip.  Area cleaned with alcohol swab, tick was removed completely.  No hx of rashes, joint pain or fever.  Duration of the bite is unknown.  Will start doxy.  Pt agrees to return here if needed  Patient / Family / Caregiver understand and agree with initial ED impression and plan with expectations set for ED visit. Pt stable in ED with no significant deterioration in condition. Pt feels improved after observation and/or treatment in ED.        Lener Ventresca L. [place], [place] 11/30/11 1939

## 2011-11-30 NOTE — ED Notes (Signed)
Tick embedded on left lateral thigh

## 2011-11-30 NOTE — Discharge Instructions (Signed)
Wood Tick Bite Ticks are insects that attach themselves to the skin. Most tick bites are harmless, but sometimes ticks carry diseases that can make a person quite ill. The chance of getting ill depends on:  The kind of tick that bites you.   Time of year.   How long the tick is attached.   Geographic location.  Wood ticks are also called dog ticks. They are generally black. They can have white markings. They live in shrubs and grassy areas. They are larger than deer ticks. Wood ticks are about the size of a watermelon seed. They have a hard body. The most common places for ticks to attach themselves are the scalp, neck, armpits, waist, and groin. Wood ticks may stay attached for up to 2 weeks. TICKS MUST BE REMOVED AS SOON AS POSSIBLE TO HELP PREVENT DISEASES CAUSED BY TICK BITES.  TO REMOVE A TICK: 1. If available, put on latex gloves before trying to remove a tick.  2. Grasp the tick as close to the skin as possible, with curved forceps, fine tweezers or a special tick removal tool.  3. Pull gently with steady pressure until the tick lets go. Do not twist the tick or jerk it suddenly. This may break off the tick's head or mouth parts.  4. Do not crush the tick's body. This could force disease-carrying fluids from the tick into your body.  5. After the tick is removed, wash the bite area and your hands with soap and water or other disinfectant.  6. Apply a small amount of antiseptic cream or ointment to the bite site.  7. Wash and disinfect any instruments that were used.  8. Save the tick in a jar or plastic bag for later identification. Preserve the tick with a bit of alcohol or put it in the freezer.  9. Do not apply a hot match, petroleum jelly, or fingernail polish to the tick. This does not work and may increase the chances of disease from the tick bite.  YOU MAY NEED TO SEE YOUR CAREGIVER FOR A TETANUS SHOT NOW IF:  You have no idea when you had the last one.   You have never had a  tetanus shot before.  If you need a tetanus shot, and you decide not to get one, there is a rare chance of getting tetanus. Sickness from tetanus can be serious. If you get a tetanus shot, your arm may swell, get red and warm to the touch at the shot site. This is common and not a problem. PREVENTION  Wear protective clothing. Long sleeves and pants are best.   Wear white clothes to see ticks more easily   Tuck your pant legs into your socks.   If walking on trail, stay in the middle of the trail to avoid brushing against bushes.   Put insect repellent on all exposed skin and along boot tops, pant legs and sleeve cuffs   Check clothing, hair and skin repeatedly and before coming inside.   Brush off any ticks that are not attached.  SEEK MEDICAL CARE IF:   You cannot remove a tick or part of the tick that is left in the skin.   Unexplained fever.   Redness and swelling in the area of the tick bite.   Tender, swollen lymph glands.   Diarrhea.   Weight loss.   Cough.   Fatigue.   Muscle, joint or bone pain.   Belly pain.   Headache.   Rash.    SEEK IMMEDIATE MEDICAL CARE IF:   You develop an oral temperature above 102 F (38.9 C).   You are having trouble walking or moving your legs.   Numbness in the legs.   Shortness of breath.   Confusion.   Repeated vomiting.  Document Released: 05/28/2000 Document Revised: 05/20/2011 Document Reviewed: 05/06/2008 [employer] Patient Information 2012 [employer]. 

## 2011-12-02 NOTE — ED Provider Notes (Signed)
Medical screening examination/treatment/procedure(s) were performed by non-physician practitioner and as supervising physician I was immediately available for consultation/collaboration.  Shelda Jakes, MD 12/02/11 [phone]

## 2011-12-14 DIAGNOSIS — E785 Hyperlipidemia, unspecified: Secondary | ICD-10-CM | POA: Diagnosis not present

## 2011-12-14 DIAGNOSIS — IMO0002 Reserved for concepts with insufficient information to code with codable children: Secondary | ICD-10-CM | POA: Diagnosis not present

## 2011-12-14 DIAGNOSIS — M1990 Unspecified osteoarthritis, unspecified site: Secondary | ICD-10-CM | POA: Diagnosis not present

## 2012-01-12 ENCOUNTER — Emergency Department (HOSPITAL_COMMUNITY)
Admission: EM | Admit: 2012-01-12 | Discharge: 2012-01-12 | Disposition: A | Discharge status: Home / Self Care | Payer: Medicare Other | Attending: Emergency Medicine | Admitting: Emergency Medicine

## 2012-01-12 ENCOUNTER — Encounter (HOSPITAL_COMMUNITY): Payer: Self-pay | Admitting: Emergency Medicine

## 2012-01-12 DIAGNOSIS — F329 Major depressive disorder, single episode, unspecified: Secondary | ICD-10-CM | POA: Insufficient documentation

## 2012-01-12 DIAGNOSIS — M79609 Pain in unspecified limb: Secondary | ICD-10-CM | POA: Diagnosis not present

## 2012-01-12 DIAGNOSIS — E119 Type 2 diabetes mellitus without complications: Secondary | ICD-10-CM | POA: Diagnosis not present

## 2012-01-12 DIAGNOSIS — R5381 Other malaise: Secondary | ICD-10-CM | POA: Diagnosis not present

## 2012-01-12 DIAGNOSIS — Z940 Kidney transplant status: Secondary | ICD-10-CM | POA: Diagnosis not present

## 2012-01-12 DIAGNOSIS — I10 Essential (primary) hypertension: Secondary | ICD-10-CM | POA: Insufficient documentation

## 2012-01-12 DIAGNOSIS — M79643 Pain in unspecified hand: Secondary | ICD-10-CM

## 2012-01-12 DIAGNOSIS — F3289 Other specified depressive episodes: Secondary | ICD-10-CM | POA: Insufficient documentation

## 2012-01-12 DIAGNOSIS — R29898 Other symptoms and signs involving the musculoskeletal system: Secondary | ICD-10-CM | POA: Insufficient documentation

## 2012-01-12 DIAGNOSIS — R5383 Other fatigue: Secondary | ICD-10-CM | POA: Diagnosis not present

## 2012-01-12 DIAGNOSIS — M25549 Pain in joints of unspecified hand: Secondary | ICD-10-CM | POA: Diagnosis not present

## 2012-01-12 LAB — POCT I-STAT, CHEM 8
BUN: 11 mg/dL (ref 6–23)
Calcium, Ion: 1.21 mmol/L (ref 1.12–1.23)
HCT: 42 % (ref 39.0–52.0)
Hemoglobin: 14.3 g/dL (ref 13.0–17.0)
Sodium: 140 mEq/L (ref 135–145)
TCO2: 23 mmol/L (ref 0–100)

## 2012-01-12 MED ORDER — OXYCODONE-ACETAMINOPHEN 5-325 MG PO TABS
2.0000 | ORAL_TABLET | Freq: Once | ORAL | Status: AC
  Administered 2012-01-12: 2 via ORAL
  Filled 2012-01-12: qty 2

## 2012-01-12 NOTE — ED Provider Notes (Signed)
History   This chart was scribed for Joya Gaskins, MD by Charolett Bumpers . The patient was seen in room APA18/APA18. Patient's care was started at 0711.    CSN: [id number]  Arrival date & time 01/12/12  0709   First MD Initiated Contact with Patient 01/12/12 [phone]      Chief Complaint  Patient presents with  . Arm Pain     HPI Robert Durham Head is a 56 y.o. male who presents to the Emergency Department complaining of constant, moderate right arm pain with on onset of 2 days ago. Pt states that he woke up Monday with the pain and states that the right arm pain worsened last night. Pt describes his arm pain as achy from fingertips into right shoulder. Pt also reports associated weakness with his right arm. Pt denies any tingling or numbness of his extremities. Pt denies any radiation into neck. Pt states that his symptoms are aggravated with movement. Pt denies any recent injuries or falls. Pt denies any new medications. Pt reports that he has had a kidney transplant and reports a h/o diabetes. Pt denies any h/o stroke.   PCP: Dr. Loleta Chance  Past Medical History  Diagnosis Date  . Renal disorder   . Hypertension   . Depression   . Urinary retention   . Diabetes mellitus     Past Surgical History  Procedure Date  . Kidney transplant   . Hernia repair   . Nephrectomy transplanted organ     Family History  Problem Relation Age of Onset  . Hypertension Sister     History  Substance Use Topics  . Smoking status: Never Smoker   . Smokeless tobacco: Never Used  . Alcohol Use: 1.2 oz/week    2 Cans of beer per week     occasionally      Review of Systems  HENT:       No facial weakness.   Eyes: Negative for visual disturbance.  Respiratory: Negative for shortness of breath.   Cardiovascular: Negative for chest pain.  Musculoskeletal: Negative for back pain.       Right arm pain.   Skin: Negative for color change.  Neurological: Positive for weakness. Negative  for dizziness, speech difficulty, numbness and headaches.  All other systems reviewed and are negative.    Allergies  Codeine and Vasotec  Home Medications   Current Outpatient Rx  Name Route Sig Dispense Refill  . AMLODIPINE BESYLATE 2.5 MG PO TABS Oral Take 2.5 mg by mouth every morning.     . ATORVASTATIN CALCIUM 10 MG PO TABS Oral Take 10 mg by mouth every morning.     . CYCLOSPORINE 100 MG PO CAPS Oral Take 125 mg by mouth 2 (two) times daily. Take with 25 mg tablet to equal 125 mg twice daily    . CYCLOSPORINE 25 MG PO CAPS Oral Take 125 mg by mouth 2 (two) times daily. Take with 100 mg tablet to equal dose of 125 mg twice daily    . ESOMEPRAZOLE MAGNESIUM 40 MG PO CPDR Oral Take 40 mg by mouth 2 (two) times daily.     Marland Kitchen METOCLOPRAMIDE HCL 10 MG PO TABS Oral Take 1 tablet (10 mg total) by mouth every 6 (six) hours. 30 tablet 0  . PREDNISONE 5 MG PO TABS Oral Take 5 mg by mouth every morning.     Marland Kitchen PROMETHAZINE HCL 25 MG PO TABS Oral Take 1 tablet (25 mg total) by mouth  every 6 (six) hours as needed for nausea. 10 tablet 0  . TAMSULOSIN HCL 0.4 MG PO CAPS Oral Take 0.4 mg by mouth every morning.       BP 125/74  Pulse 83  Temp 98.9 F (37.2 C)  Resp 18  Ht 5\' 11"  (1.803 m)  Wt 180 lb (81.647 kg)  BMI 25.10 kg/m2  SpO2 96%  Physical Exam CONSTITUTIONAL: Well developed/well nourished HEAD AND FACE: Normocephalic/atraumatic EYES: EOMI/PERRL ENMT: Mucous membranes moist NECK: supple no meningeal signs SPINE:entire spine nontender CV: S1/S2 noted, no murmurs/rubs/gallops noted LUNGS: Lungs are clear to auscultation bilaterally, no apparent distress ABDOMEN: soft, nontender, no rebound or guarding GU:no cva tenderness NEURO: Pt is awake/alert, moves all extremitiesx4, no arm or leg drift. Face is symmetric.  Equal hand grips EXTREMITIES: pulses normal, full ROM. The right hand is warm to touch and distal cap refill less than 2 seconds in each  hands. He has tenderness  diffusely to right hand, but no deformity.  The rest of the right UE appears nontender.  Equalwrist flex/extension, elbow flex/extension, and equal power with shoulder abduction/adduction.  No focal sensory deficit is noted in either UE.   Equal biceps/brachioradial reflex in bilateral UE.  No edema to either UE.  No erythema noted to either UE.   SKIN: warm, color normal PSYCH: no abnormalities of mood noted  ED Course  Procedures   DIAGNOSTIC STUDIES: Oxygen Saturation is 96% on room air, adequate by my interpretation.    COORDINATION OF CARE:  07:26-Discussed planned course of treatment with the patient, who is agreeable at this time.   Results for orders placed during the hospital encounter of 01/12/12  POCT I-STAT, CHEM 8      Component Value Range   Sodium 140  135 - 145 mEq/L   Potassium 3.6  3.5 - 5.1 mEq/L   Chloride 105  96 - 112 mEq/L   BUN 11  6 - 23 mg/dL   Creatinine, Ser 5.28 (*) 0.50 - 1.35 mg/dL   Glucose, Bld 96  70 - 99 mg/dL   Calcium, Ion 4.13  2.44 - 1.23 mmol/L   TCO2 23  0 - 100 mmol/L   Hemoglobin 14.3  13.0 - 17.0 g/dL   HCT 01.0  27.2 - 53.6 %   Pt without any recent trauma and no focal deformity noted, doubt bony injury.  Doubt vascular emergency as equal pulses noted and no signs of afib.  Also, no signs of DVT.  No signs of infection to the upper extremity Labs reassuring and no signs of hypoK/hypoCalcemia.  Creatinine near baseline.  I doubt ACS as well.  No signs of cervical radiculopathy.  Given velcro splint for comfort and advised close PCP followup this week.  He reports he woke up with this pain and not sudden onset.  He reports weakness in HPI but no weakness noted on exam    MDM  Nursing notes including past medical history and social history reviewed and considered in documentation Labs/vital reviewed and considered     Date: 01/12/2012  Rate: 62  Rhythm: normal sinus rhythm  QRS Axis: normal  Intervals: normal  ST/T Wave  abnormalities: normal  Conduction Disutrbances:none     I personally performed the services described in this documentation, which was scribed in my presence. The recorded information has been reviewed and considered.         Joya Gaskins, MD 01/12/12 [phone]

## 2012-01-12 NOTE — ED Notes (Signed)
Pt c/o r arm pain from shoulder to tips of fingers. Denies injury. Denies lifting anything heavy. Radial pulses strong. Denies tingling/numbness. Alert/roeinted. Denies dizziness/cp/sob/slurred speech. States r arm feels weaker than other arm. Guarding arm. States pain worse with movement.

## 2012-01-13 ENCOUNTER — Other Ambulatory Visit (HOSPITAL_COMMUNITY): Payer: Self-pay | Admitting: Family Medicine

## 2012-01-13 ENCOUNTER — Ambulatory Visit (HOSPITAL_COMMUNITY)
Admission: RE | Admit: 2012-01-13 | Discharge: 2012-01-13 | Disposition: A | Discharge status: Home / Self Care | Payer: Medicare Other | Source: Ambulatory Visit | Attending: Family Medicine | Admitting: Family Medicine

## 2012-01-13 DIAGNOSIS — N182 Chronic kidney disease, stage 2 (mild): Secondary | ICD-10-CM | POA: Diagnosis not present

## 2012-01-13 DIAGNOSIS — IMO0002 Reserved for concepts with insufficient information to code with codable children: Secondary | ICD-10-CM | POA: Diagnosis not present

## 2012-01-13 DIAGNOSIS — M7989 Other specified soft tissue disorders: Secondary | ICD-10-CM | POA: Diagnosis not present

## 2012-01-13 DIAGNOSIS — M79609 Pain in unspecified limb: Secondary | ICD-10-CM | POA: Diagnosis not present

## 2012-01-13 DIAGNOSIS — M25539 Pain in unspecified wrist: Secondary | ICD-10-CM | POA: Insufficient documentation

## 2012-01-13 DIAGNOSIS — E785 Hyperlipidemia, unspecified: Secondary | ICD-10-CM | POA: Diagnosis not present

## 2012-01-13 DIAGNOSIS — R52 Pain, unspecified: Secondary | ICD-10-CM | POA: Diagnosis not present

## 2012-01-13 DIAGNOSIS — I110 Hypertensive heart disease with heart failure: Secondary | ICD-10-CM | POA: Diagnosis not present

## 2012-01-13 DIAGNOSIS — E109 Type 1 diabetes mellitus without complications: Secondary | ICD-10-CM | POA: Diagnosis not present

## 2012-02-03 DIAGNOSIS — E1065 Type 1 diabetes mellitus with hyperglycemia: Secondary | ICD-10-CM | POA: Diagnosis not present

## 2012-02-03 DIAGNOSIS — M009 Pyogenic arthritis, unspecified: Secondary | ICD-10-CM | POA: Diagnosis not present

## 2012-02-03 DIAGNOSIS — M1250 Traumatic arthropathy, unspecified site: Secondary | ICD-10-CM | POA: Diagnosis not present

## 2012-02-03 DIAGNOSIS — I119 Hypertensive heart disease without heart failure: Secondary | ICD-10-CM | POA: Diagnosis not present

## 2012-02-15 DIAGNOSIS — Z79899 Other long term (current) drug therapy: Secondary | ICD-10-CM | POA: Diagnosis not present

## 2012-02-15 DIAGNOSIS — Z48298 Encounter for aftercare following other organ transplant: Secondary | ICD-10-CM | POA: Diagnosis not present

## 2012-02-15 DIAGNOSIS — Z9225 Personal history of immunosupression therapy: Secondary | ICD-10-CM | POA: Diagnosis not present

## 2012-02-15 DIAGNOSIS — D649 Anemia, unspecified: Secondary | ICD-10-CM | POA: Diagnosis not present

## 2012-02-15 DIAGNOSIS — Z940 Kidney transplant status: Secondary | ICD-10-CM | POA: Diagnosis not present

## 2012-02-15 DIAGNOSIS — I10 Essential (primary) hypertension: Secondary | ICD-10-CM | POA: Diagnosis not present

## 2012-02-16 DIAGNOSIS — Z940 Kidney transplant status: Secondary | ICD-10-CM | POA: Diagnosis not present

## 2012-02-16 DIAGNOSIS — I129 Hypertensive chronic kidney disease with stage 1 through stage 4 chronic kidney disease, or unspecified chronic kidney disease: Secondary | ICD-10-CM | POA: Diagnosis not present

## 2012-02-21 DIAGNOSIS — Z48298 Encounter for aftercare following other organ transplant: Secondary | ICD-10-CM | POA: Diagnosis not present

## 2012-02-21 DIAGNOSIS — Z79899 Other long term (current) drug therapy: Secondary | ICD-10-CM | POA: Diagnosis not present

## 2012-02-21 DIAGNOSIS — Z940 Kidney transplant status: Secondary | ICD-10-CM | POA: Diagnosis not present

## 2012-02-28 DIAGNOSIS — N400 Enlarged prostate without lower urinary tract symptoms: Secondary | ICD-10-CM | POA: Diagnosis not present

## 2012-03-02 DIAGNOSIS — M1990 Unspecified osteoarthritis, unspecified site: Secondary | ICD-10-CM | POA: Diagnosis not present

## 2012-03-02 DIAGNOSIS — I110 Hypertensive heart disease with heart failure: Secondary | ICD-10-CM | POA: Diagnosis not present

## 2012-03-02 DIAGNOSIS — E785 Hyperlipidemia, unspecified: Secondary | ICD-10-CM | POA: Diagnosis not present

## 2012-03-02 DIAGNOSIS — I10 Essential (primary) hypertension: Secondary | ICD-10-CM | POA: Diagnosis not present

## 2012-03-06 ENCOUNTER — Emergency Department (HOSPITAL_COMMUNITY)
Admission: EM | Admit: 2012-03-06 | Discharge: 2012-03-06 | Disposition: A | Discharge status: Home / Self Care | Payer: Medicare Other | Attending: Emergency Medicine | Admitting: Emergency Medicine

## 2012-03-06 ENCOUNTER — Encounter (HOSPITAL_COMMUNITY): Payer: Self-pay | Admitting: Emergency Medicine

## 2012-03-06 DIAGNOSIS — F329 Major depressive disorder, single episode, unspecified: Secondary | ICD-10-CM | POA: Diagnosis not present

## 2012-03-06 DIAGNOSIS — E119 Type 2 diabetes mellitus without complications: Secondary | ICD-10-CM | POA: Insufficient documentation

## 2012-03-06 DIAGNOSIS — R1110 Vomiting, unspecified: Secondary | ICD-10-CM | POA: Insufficient documentation

## 2012-03-06 DIAGNOSIS — Z87891 Personal history of nicotine dependence: Secondary | ICD-10-CM | POA: Insufficient documentation

## 2012-03-06 DIAGNOSIS — Z888 Allergy status to other drugs, medicaments and biological substances status: Secondary | ICD-10-CM | POA: Diagnosis not present

## 2012-03-06 DIAGNOSIS — R112 Nausea with vomiting, unspecified: Secondary | ICD-10-CM | POA: Diagnosis not present

## 2012-03-06 DIAGNOSIS — Z885 Allergy status to narcotic agent status: Secondary | ICD-10-CM | POA: Insufficient documentation

## 2012-03-06 DIAGNOSIS — Z8249 Family history of ischemic heart disease and other diseases of the circulatory system: Secondary | ICD-10-CM | POA: Insufficient documentation

## 2012-03-06 DIAGNOSIS — I10 Essential (primary) hypertension: Secondary | ICD-10-CM | POA: Insufficient documentation

## 2012-03-06 DIAGNOSIS — F3289 Other specified depressive episodes: Secondary | ICD-10-CM | POA: Insufficient documentation

## 2012-03-06 LAB — CBC WITH DIFFERENTIAL/PLATELET
Basophils Absolute: 0 10*3/uL (ref 0.0–0.1)
Eosinophils Relative: 3 % (ref 0–5)
Lymphocytes Relative: 33 % (ref 12–46)
MCV: 69.1 fL — ABNORMAL LOW (ref 78.0–100.0)
Neutrophils Relative %: 53 % (ref 43–77)
Platelets: 150 10*3/uL (ref 150–400)
RDW: 15.5 % (ref 11.5–15.5)
WBC: 7 10*3/uL (ref 4.0–10.5)

## 2012-03-06 LAB — BASIC METABOLIC PANEL
CO2: 27 mEq/L (ref 19–32)
Calcium: 9.1 mg/dL (ref 8.4–10.5)
GFR calc non Af Amer: 54 mL/min — ABNORMAL LOW (ref >90)
Potassium: 3.7 mEq/L (ref 3.5–5.1)
Sodium: 136 mEq/L (ref 135–145)

## 2012-03-06 LAB — URINALYSIS, ROUTINE W REFLEX MICROSCOPIC
Glucose, UA: NEGATIVE mg/dL
Leukocytes, UA: NEGATIVE
Protein, ur: NEGATIVE mg/dL
Specific Gravity, Urine: 1.01 (ref 1.005–1.030)
Urobilinogen, UA: 0.2 mg/dL (ref 0.0–1.0)

## 2012-03-06 LAB — HEPATIC FUNCTION PANEL
Indirect Bilirubin: 0.3 mg/dL (ref 0.3–0.9)
Total Protein: 7.2 g/dL (ref 6.0–8.3)

## 2012-03-06 MED ORDER — ONDANSETRON HCL 4 MG/2ML IJ SOLN
4.0000 mg | Freq: Once | INTRAMUSCULAR | Status: AC
  Administered 2012-03-06: 4 mg via INTRAVENOUS
  Filled 2012-03-06: qty 2

## 2012-03-06 MED ORDER — PROMETHAZINE HCL 25 MG PO TABS: 25.0000 mg | ORAL_TABLET | Freq: Four times a day (QID) | ORAL | Status: DC | PRN

## 2012-03-06 MED ORDER — SODIUM CHLORIDE 0.9 % IV SOLN
Freq: Once | INTRAVENOUS | Status: AC
  Administered 2012-03-06: 10:00:00 via INTRAVENOUS

## 2012-03-06 NOTE — ED Notes (Signed)
Pt c/o N/V x 3 days. Pt stated he has decreased appetite and generalized soreness.

## 2012-03-06 NOTE — ED Provider Notes (Signed)
History   This chart was scribed for [name], MD by Gerlean Ren. This patient was seen in room APA15/APA15 and the patient's care was started at 9:48AM.   CSN: [id number]  Arrival date & time 03/06/12  0910   First MD Initiated Contact with Patient 03/06/12 [phone]      Chief Complaint  Patient presents with  . Emesis    (Consider location/radiation/quality/duration/timing/severity/associated sxs/prior treatment) Patient is a 56 y.o. male presenting with vomiting. The history is provided by the patient. No language interpreter was used.  Emesis  This is a new problem. The current episode started more than 2 days ago. The problem has not changed since onset.The emesis has an appearance of stomach contents. There has been no fever. Associated symptoms include myalgias. Pertinent negatives include no abdominal pain, no chills, no cough, no diarrhea, no fever and no headaches.   Robert Durham is a 56 y.o. male who presents to the Emergency Department complaining of 4 episodes of non-bloody, non-bilious emesis over past 3 days with associated off-and-on nausea and decreased appetite.  Pt reports decreased bowel movements in accordance with reduced appetite, but denies diarrhea or any urinary symptoms.  Pt reports myalgias, but denies any specific areas of pain.  Pt has h/o renal disorder, HTN, depression, and DM.  Pt had left kidney transplant.  Pt denies tobacco and alcohol use.    Past Medical History  Diagnosis Date  . Renal disorder   . Hypertension   . Depression   . Urinary retention   . Diabetes mellitus     Past Surgical History  Procedure Date  . Kidney transplant   . Hernia repair   . Nephrectomy transplanted organ     Family History  Problem Relation Age of Onset  . Hypertension Sister     History  Substance Use Topics  . Smoking status: Former Games developer  . Smokeless tobacco: Never Used  . Alcohol Use: No     occasionally      Review of Systems   Constitutional: Negative for fever, chills and fatigue.  HENT: Negative for congestion, sinus pressure and ear discharge.   Eyes: Negative for discharge.  Respiratory: Negative for cough.   Cardiovascular: Negative for chest pain.  Gastrointestinal: Positive for nausea and vomiting. Negative for abdominal pain and diarrhea.  Genitourinary: Negative for frequency and hematuria.  Musculoskeletal: Positive for myalgias. Negative for back pain.  Skin: Negative for rash.  Neurological: Negative for seizures and headaches.  Hematological: Negative.   Psychiatric/Behavioral: Negative for hallucinations.    Allergies  Codeine and Vasotec  Home Medications   Current Outpatient Rx  Name Route Sig Dispense Refill  . AMLODIPINE BESYLATE 2.5 MG PO TABS Oral Take 2.5 mg by mouth every morning.     . ATORVASTATIN CALCIUM 10 MG PO TABS Oral Take 10 mg by mouth every morning.     . CYCLOSPORINE 100 MG PO CAPS Oral Take 125 mg by mouth 2 (two) times daily. Take with 25 mg tablet to equal 125 mg twice daily    . CYCLOSPORINE 25 MG PO CAPS Oral Take 125 mg by mouth 2 (two) times daily. Take with 100 mg tablet to equal dose of 125 mg twice daily    . ESOMEPRAZOLE MAGNESIUM 40 MG PO CPDR Oral Take 40 mg by mouth 2 (two) times daily.     Marland Kitchen METOCLOPRAMIDE HCL 10 MG PO TABS Oral Take 1 tablet (10 mg total) by mouth every 6 (six) hours.  30 tablet 0  . PREDNISONE 5 MG PO TABS Oral Take 5 mg by mouth every morning.     Marland Kitchen PROMETHAZINE HCL 25 MG PO TABS Oral Take 1 tablet (25 mg total) by mouth every 6 (six) hours as needed for nausea. 10 tablet 0  . TAMSULOSIN HCL 0.4 MG PO CAPS Oral Take 0.4 mg by mouth every morning.       BP 113/80  Pulse 71  Temp 98.2 F (36.8 C) (Oral)  Resp 15  Ht 5\' 11"  (1.803 m)  Wt 180 lb (81.647 kg)  BMI 25.10 kg/m2  SpO2 100%  Physical Exam  Nursing note and vitals reviewed. Constitutional: He is oriented to person, place, and time. He appears well-developed.  HENT:   Head: Normocephalic and atraumatic.  Eyes: Conjunctivae normal and EOM are normal. No scleral icterus.  Neck: Neck supple. No thyromegaly present.  Cardiovascular: Normal rate and regular rhythm.  Exam reveals no gallop and no friction rub.   No murmur heard. Pulmonary/Chest: No stridor. He has no wheezes. He has no rales. He exhibits no tenderness.  Abdominal: He exhibits no distension. There is tenderness. There is no rebound.       Minimal diffuse abdominal tenderness. Well-healed scar over LLQ.  Musculoskeletal: Normal range of motion. He exhibits no edema.  Lymphadenopathy:    He has no cervical adenopathy.  Neurological: He is oriented to person, place, and time. Coordination normal.  Skin: No rash noted. No erythema.  Psychiatric: He has a normal mood and affect. His behavior is normal.    ED Course  Procedures (including critical care time) DIAGNOSTIC STUDIES: Oxygen Saturation is 100% on room air, normal by my interpretation.    COORDINATION OF CARE: 9:49AM- Ordered zofran, IV fluids, hepatic function panel, urinalysis, CBC, and basic met.    No results found. Results for orders placed during the hospital encounter of 03/06/12  CBC WITH DIFFERENTIAL      Component Value Range   WBC 7.0  4.0 - 10.5 K/uL   RBC 5.69  4.22 - 5.81 MIL/uL   Hemoglobin 12.8 (*) 13.0 - 17.0 g/dL   HCT 65.7  84.6 - 96.2 %   MCV 69.1 (*) 78.0 - 100.0 fL   MCH 22.5 (*) 26.0 - 34.0 pg   MCHC 32.6  30.0 - 36.0 g/dL   RDW 95.2  84.1 - 32.4 %   Platelets 150  150 - 400 K/uL   Neutrophils Relative 53  43 - 77 %   Neutro Abs 3.7  1.7 - 7.7 K/uL   Lymphocytes Relative 33  12 - 46 %   Lymphs Abs 2.3  0.7 - 4.0 K/uL   Monocytes Relative 10  3 - 12 %   Monocytes Absolute 0.7  0.1 - 1.0 K/uL   Eosinophils Relative 3  0 - 5 %   Eosinophils Absolute 0.2  0.0 - 0.7 K/uL   Basophils Relative 0  0 - 1 %   Basophils Absolute 0.0  0.0 - 0.1 K/uL  BASIC METABOLIC PANEL      Component Value Range    Sodium 136  135 - 145 mEq/L   Potassium 3.7  3.5 - 5.1 mEq/L   Chloride 102  96 - 112 mEq/L   CO2 27  19 - 32 mEq/L   Glucose, Bld 91  70 - 99 mg/dL   BUN 15  6 - 23 mg/dL   Creatinine, Ser 4.01 (*) 0.50 - 1.35 mg/dL   Calcium  9.1  8.4 - 10.5 mg/dL   GFR calc non Af Amer 54 (*) >90 mL/min   GFR calc Af Amer 63 (*) >90 mL/min  URINALYSIS, ROUTINE W REFLEX MICROSCOPIC      Component Value Range   Color, Urine YELLOW  YELLOW   APPearance CLEAR  CLEAR   Specific Gravity, Urine 1.010  1.005 - 1.030   pH 6.5  5.0 - 8.0   Glucose, UA NEGATIVE  NEGATIVE mg/dL   Hgb urine dipstick NEGATIVE  NEGATIVE   Bilirubin Urine NEGATIVE  NEGATIVE   Ketones, ur NEGATIVE  NEGATIVE mg/dL   Protein, ur NEGATIVE  NEGATIVE mg/dL   Urobilinogen, UA 0.2  0.0 - 1.0 mg/dL   Nitrite NEGATIVE  NEGATIVE   Leukocytes, UA NEGATIVE  NEGATIVE  HEPATIC FUNCTION PANEL      Component Value Range   Total Protein 7.2  6.0 - 8.3 g/dL   Albumin 3.4 (*) 3.5 - 5.2 g/dL   AST 21  0 - 37 U/L   ALT 11  0 - 53 U/L   Alkaline Phosphatase 69  39 - 117 U/L   Total Bilirubin 0.4  0.3 - 1.2 mg/dL   Bilirubin, Direct 0.1  0.0 - 0.3 mg/dL   Indirect Bilirubin 0.3  0.3 - 0.9 mg/dL     No diagnosis found.    MDM   The chart was scribed for me under my direct supervision.  I personally performed the history, physical, and medical decision making and all procedures in the evaluation of this patient.[name], MD 03/06/12 1346

## 2012-03-30 DIAGNOSIS — IMO0002 Reserved for concepts with insufficient information to code with codable children: Secondary | ICD-10-CM | POA: Diagnosis not present

## 2012-03-30 DIAGNOSIS — M1990 Unspecified osteoarthritis, unspecified site: Secondary | ICD-10-CM | POA: Diagnosis not present

## 2012-03-30 DIAGNOSIS — E785 Hyperlipidemia, unspecified: Secondary | ICD-10-CM | POA: Diagnosis not present

## 2012-03-30 DIAGNOSIS — I119 Hypertensive heart disease without heart failure: Secondary | ICD-10-CM | POA: Diagnosis not present

## 2012-04-04 DIAGNOSIS — Z23 Encounter for immunization: Secondary | ICD-10-CM | POA: Diagnosis not present

## 2012-04-11 ENCOUNTER — Emergency Department (HOSPITAL_COMMUNITY)
Admission: EM | Admit: 2012-04-11 | Discharge: 2012-04-11 | Disposition: A | Discharge status: Home / Self Care | Payer: Medicare Other | Attending: Emergency Medicine | Admitting: Emergency Medicine

## 2012-04-11 ENCOUNTER — Encounter (HOSPITAL_COMMUNITY): Payer: Self-pay

## 2012-04-11 DIAGNOSIS — Z940 Kidney transplant status: Secondary | ICD-10-CM | POA: Diagnosis not present

## 2012-04-11 DIAGNOSIS — M542 Cervicalgia: Secondary | ICD-10-CM | POA: Diagnosis not present

## 2012-04-11 DIAGNOSIS — Z87891 Personal history of nicotine dependence: Secondary | ICD-10-CM | POA: Diagnosis not present

## 2012-04-11 DIAGNOSIS — E119 Type 2 diabetes mellitus without complications: Secondary | ICD-10-CM | POA: Insufficient documentation

## 2012-04-11 DIAGNOSIS — Z87448 Personal history of other diseases of urinary system: Secondary | ICD-10-CM | POA: Diagnosis not present

## 2012-04-11 DIAGNOSIS — IMO0002 Reserved for concepts with insufficient information to code with codable children: Secondary | ICD-10-CM | POA: Diagnosis not present

## 2012-04-11 DIAGNOSIS — I10 Essential (primary) hypertension: Secondary | ICD-10-CM | POA: Insufficient documentation

## 2012-04-11 DIAGNOSIS — Z8659 Personal history of other mental and behavioral disorders: Secondary | ICD-10-CM | POA: Diagnosis not present

## 2012-04-11 DIAGNOSIS — Z79899 Other long term (current) drug therapy: Secondary | ICD-10-CM | POA: Diagnosis not present

## 2012-04-11 LAB — URINALYSIS, ROUTINE W REFLEX MICROSCOPIC
Leukocytes, UA: NEGATIVE
Nitrite: NEGATIVE
Specific Gravity, Urine: 1.02 (ref 1.005–1.030)
pH: 6 (ref 5.0–8.0)

## 2012-04-11 MED ORDER — IBUPROFEN 800 MG PO TABS
800.0000 mg | ORAL_TABLET | Freq: Once | ORAL | Status: AC
  Administered 2012-04-11: 800 mg via ORAL
  Filled 2012-04-11: qty 1

## 2012-04-11 MED ORDER — CYCLOBENZAPRINE HCL 10 MG PO TABS: 10.0000 mg | ORAL_TABLET | Freq: Two times a day (BID) | ORAL | Status: DC | PRN

## 2012-04-11 MED ORDER — HYDROCODONE-ACETAMINOPHEN 5-325 MG PO TABS
1.0000 | ORAL_TABLET | Freq: Once | ORAL | Status: AC
  Administered 2012-04-11: 1 via ORAL
  Filled 2012-04-11: qty 1

## 2012-04-11 MED ORDER — OXYCODONE-ACETAMINOPHEN 5-325 MG PO TABS: 1.0000 | ORAL_TABLET | Freq: Four times a day (QID) | ORAL | Status: DC | PRN

## 2012-04-11 NOTE — ED Provider Notes (Signed)
History    This chart was scribed for Robert Hutching, MD, MD by Smitty Pluck. The patient was seen in room APA06 and the patient's care was started at 10:08AM.   CSN: [id number]  Arrival date & time 04/11/12  0909        Chief Complaint  Patient presents with  . Neck Pain    (Consider location/radiation/quality/duration/timing/severity/associated sxs/prior treatment) Patient is a 56 y.o. male presenting with neck pain. The history is provided by the patient. No language interpreter was used.  Neck Pain    Robert Durham is a 56 y.o. male who presents to the Emergency Department complaining of constant, moderate posterior neck pain onset 4 days ago. Pt reports that he has seen PCP and prescribed tramadol. Pt reports that tramadol is not relieving pain. He reports that he has had normal appetite and urination. He denies trauma and neck injury. He reports having intermittent right lower lateral abdomen pain. Denies any other pain.   Past Medical History  Diagnosis Date  . Renal disorder   . Hypertension   . Depression   . Urinary retention   . Diabetes mellitus     Past Surgical History  Procedure Date  . Kidney transplant   . Hernia repair   . Nephrectomy transplanted organ     Family History  Problem Relation Age of Onset  . Hypertension Sister     History  Substance Use Topics  . Smoking status: Former Games developer  . Smokeless tobacco: Never Used  . Alcohol Use: No     occasionally      Review of Systems  All other systems reviewed and are negative.  10 Systems reviewed and all are negative for acute change except as noted in the HPI.    Allergies  Codeine and Vasotec  Home Medications   Current Outpatient Rx  Name Route Sig Dispense Refill  . AMLODIPINE BESYLATE 2.5 MG PO TABS Oral Take 2.5 mg by mouth every morning.     . ATORVASTATIN CALCIUM 10 MG PO TABS Oral Take 10 mg by mouth every morning.     . CYCLOSPORINE 100 MG PO CAPS Oral Take 125 mg by  mouth 2 (two) times daily. Take with 25 mg tablet to equal 125 mg twice daily    . CYCLOSPORINE 25 MG PO CAPS Oral Take 125 mg by mouth 2 (two) times daily. Take with 100 mg tablet to equal dose of 125 mg twice daily    . ESOMEPRAZOLE MAGNESIUM 40 MG PO CPDR Oral Take 40 mg by mouth 2 (two) times daily.     Marland Kitchen METOCLOPRAMIDE HCL 10 MG PO TABS Oral Take 1 tablet (10 mg total) by mouth every 6 (six) hours. 30 tablet 0  . PREDNISONE 5 MG PO TABS Oral Take 5 mg by mouth every morning.     Marland Kitchen PROMETHAZINE HCL 25 MG PO TABS Oral Take 1 tablet (25 mg total) by mouth every 6 (six) hours as needed for nausea. 10 tablet 0  . PROMETHAZINE HCL 25 MG PO TABS Oral Take 1 tablet (25 mg total) by mouth every 6 (six) hours as needed for nausea. 15 tablet 0  . TAMSULOSIN HCL 0.4 MG PO CAPS Oral Take 0.4 mg by mouth every morning.       BP 97/74  Pulse 74  Temp 98.2 F (36.8 C)  Resp 18  Ht 5\' 11"  (1.803 m)  Wt 170 lb (77.111 kg)  BMI 23.71 kg/m2  SpO2 99%  Physical Exam  Nursing note and vitals reviewed. Constitutional: He is oriented to person, place, and time. He appears well-developed and well-nourished.  HENT:  Head: Normocephalic and atraumatic.  Eyes: Conjunctivae normal and EOM are normal. Pupils are equal, round, and reactive to light.  Neck: Normal range of motion. Neck supple.  Cardiovascular: Normal rate, regular rhythm and normal heart sounds.   Pulmonary/Chest: Effort normal and breath sounds normal.  Abdominal: Soft. Bowel sounds are normal. There is tenderness (minimal lower lateral ).  Musculoskeletal: Normal range of motion.  Neurological: He is alert and oriented to person, place, and time.  Skin: Skin is warm and dry.  Psychiatric: He has a normal mood and affect.    ED Course  Procedures (including critical care time) DIAGNOSTIC STUDIES: Oxygen Saturation is 99% on room air, normal by my interpretation.    COORDINATION OF CARE: 10:11 AM Discussed ED treatment with pt (pain  medication and UA) 10:48 AM Ordered:    . HYDROcodone-acetaminophen  1 tablet Oral Once  . ibuprofen  800 mg Oral Once     Results for orders placed during the hospital encounter of 04/11/12  URINALYSIS, ROUTINE W REFLEX MICROSCOPIC      Component Value Range   Color, Urine YELLOW  YELLOW   APPearance CLEAR  CLEAR   Specific Gravity, Urine 1.020  1.005 - 1.030   pH 6.0  5.0 - 8.0   Glucose, UA NEGATIVE  NEGATIVE mg/dL   Hgb urine dipstick NEGATIVE  NEGATIVE   Bilirubin Urine NEGATIVE  NEGATIVE   Ketones, ur NEGATIVE  NEGATIVE mg/dL   Protein, ur NEGATIVE  NEGATIVE mg/dL   Urobilinogen, UA 0.2  0.0 - 1.0 mg/dL   Nitrite NEGATIVE  NEGATIVE   Leukocytes, UA NEGATIVE  NEGATIVE   .     No results found.   No diagnosis found.    MDM  No acute abdomen. Urinalysis normal. Patient is well-known to me and he looks normal.  Neck pain is non-meningeal.  Suspect muscular strain. Discharge  on Percocet #15 and Flexeril 10 mg #15      I personally performed the services described in this documentation, which was scribed in my presence. The recorded information has been reviewed and considered.    Robert Hutching, MD 04/11/12 1304

## 2012-04-11 NOTE — ED Notes (Signed)
MD at bedside. 

## 2012-04-11 NOTE — ED Notes (Signed)
Pt reports neck pain since sat. Has been seen by his pmd and given tramadol, not helping his pain, denies any injury.

## 2012-05-02 DIAGNOSIS — N400 Enlarged prostate without lower urinary tract symptoms: Secondary | ICD-10-CM | POA: Diagnosis not present

## 2012-05-02 DIAGNOSIS — M1990 Unspecified osteoarthritis, unspecified site: Secondary | ICD-10-CM | POA: Diagnosis not present

## 2012-05-02 DIAGNOSIS — IMO0002 Reserved for concepts with insufficient information to code with codable children: Secondary | ICD-10-CM | POA: Diagnosis not present

## 2012-05-21 ENCOUNTER — Emergency Department (HOSPITAL_COMMUNITY)
Admission: EM | Admit: 2012-05-21 | Discharge: 2012-05-21 | Disposition: A | Discharge status: Home / Self Care | Payer: Medicare Other | Attending: Emergency Medicine | Admitting: Emergency Medicine

## 2012-05-21 ENCOUNTER — Encounter (HOSPITAL_COMMUNITY): Payer: Self-pay | Admitting: *Deleted

## 2012-05-21 DIAGNOSIS — R109 Unspecified abdominal pain: Secondary | ICD-10-CM | POA: Insufficient documentation

## 2012-05-21 DIAGNOSIS — R110 Nausea: Secondary | ICD-10-CM

## 2012-05-21 DIAGNOSIS — F3289 Other specified depressive episodes: Secondary | ICD-10-CM | POA: Insufficient documentation

## 2012-05-21 DIAGNOSIS — N289 Disorder of kidney and ureter, unspecified: Secondary | ICD-10-CM

## 2012-05-21 DIAGNOSIS — R112 Nausea with vomiting, unspecified: Secondary | ICD-10-CM | POA: Insufficient documentation

## 2012-05-21 DIAGNOSIS — M542 Cervicalgia: Secondary | ICD-10-CM | POA: Diagnosis not present

## 2012-05-21 DIAGNOSIS — R0602 Shortness of breath: Secondary | ICD-10-CM | POA: Insufficient documentation

## 2012-05-21 DIAGNOSIS — Z87448 Personal history of other diseases of urinary system: Secondary | ICD-10-CM | POA: Insufficient documentation

## 2012-05-21 DIAGNOSIS — E119 Type 2 diabetes mellitus without complications: Secondary | ICD-10-CM | POA: Diagnosis not present

## 2012-05-21 DIAGNOSIS — Z87891 Personal history of nicotine dependence: Secondary | ICD-10-CM | POA: Insufficient documentation

## 2012-05-21 DIAGNOSIS — Z79899 Other long term (current) drug therapy: Secondary | ICD-10-CM | POA: Insufficient documentation

## 2012-05-21 DIAGNOSIS — R6883 Chills (without fever): Secondary | ICD-10-CM | POA: Insufficient documentation

## 2012-05-21 DIAGNOSIS — F329 Major depressive disorder, single episode, unspecified: Secondary | ICD-10-CM | POA: Diagnosis not present

## 2012-05-21 DIAGNOSIS — I10 Essential (primary) hypertension: Secondary | ICD-10-CM | POA: Diagnosis not present

## 2012-05-21 DIAGNOSIS — R5381 Other malaise: Secondary | ICD-10-CM | POA: Insufficient documentation

## 2012-05-21 LAB — URINALYSIS, ROUTINE W REFLEX MICROSCOPIC
Bilirubin Urine: NEGATIVE
Glucose, UA: NEGATIVE mg/dL
Hgb urine dipstick: NEGATIVE
Ketones, ur: NEGATIVE mg/dL
Leukocytes, UA: NEGATIVE
Nitrite: NEGATIVE
Protein, ur: NEGATIVE mg/dL
Specific Gravity, Urine: 1.02 (ref 1.005–1.030)
Urobilinogen, UA: 1 mg/dL (ref 0.0–1.0)
pH: 7 (ref 5.0–8.0)

## 2012-05-21 LAB — CBC
HCT: 41.3 % (ref 39.0–52.0)
Hemoglobin: 13.5 g/dL (ref 13.0–17.0)
MCH: 22.5 pg — ABNORMAL LOW (ref 26.0–34.0)
MCHC: 32.7 g/dL (ref 30.0–36.0)
MCV: 68.7 fL — ABNORMAL LOW (ref 78.0–100.0)
Platelets: 142 10*3/uL — ABNORMAL LOW (ref 150–400)
RBC: 6.01 MIL/uL — ABNORMAL HIGH (ref 4.22–5.81)
RDW: 15.4 % (ref 11.5–15.5)
WBC: 6.4 10*3/uL (ref 4.0–10.5)

## 2012-05-21 LAB — BASIC METABOLIC PANEL
BUN: 15 mg/dL (ref 6–23)
CO2: 26 mEq/L (ref 19–32)
Calcium: 9.6 mg/dL (ref 8.4–10.5)
Chloride: 101 mEq/L (ref 96–112)
Creatinine, Ser: 1.54 mg/dL — ABNORMAL HIGH (ref 0.50–1.35)
GFR calc Af Amer: 56 mL/min — ABNORMAL LOW (ref >90)
GFR calc non Af Amer: 48 mL/min — ABNORMAL LOW (ref >90)
Glucose, Bld: 96 mg/dL (ref 70–99)
Potassium: 4 mEq/L (ref 3.5–5.1)
Sodium: 135 mEq/L (ref 135–145)

## 2012-05-21 LAB — GLUCOSE, CAPILLARY: Glucose-Capillary: 112 mg/dL — ABNORMAL HIGH (ref 70–99)

## 2012-05-21 MED ORDER — ONDANSETRON HCL 4 MG PO TABS: 4.0000 mg | ORAL_TABLET | Freq: Three times a day (TID) | ORAL | Status: DC | PRN

## 2012-05-21 MED ORDER — OXYCODONE-ACETAMINOPHEN 5-325 MG PO TABS
2.0000 | ORAL_TABLET | Freq: Once | ORAL | Status: AC
  Administered 2012-05-21: 2 via ORAL
  Filled 2012-05-21: qty 2

## 2012-05-21 MED ORDER — ONDANSETRON 4 MG PO TBDP
4.0000 mg | ORAL_TABLET | Freq: Once | ORAL | Status: AC
  Administered 2012-05-21: 4 mg via ORAL
  Filled 2012-05-21: qty 1

## 2012-05-21 MED ORDER — OXYCODONE-ACETAMINOPHEN 5-325 MG PO TABS: 1.0000 | ORAL_TABLET | ORAL | Status: DC | PRN

## 2012-05-21 NOTE — ED Provider Notes (Signed)
History  This chart was scribed for Robert Razor, MD by Shari Heritage, ED Scribe. The patient was seen in room APA18/APA18. Patient's care was started at 1840.  CSN: [id number]  Arrival date & time 05/21/12  1709   First MD Initiated Contact with Patient 05/21/12 1840      Chief Complaint  Patient presents with  . Emesis  . Neck Pain    The history is provided by the patient. No language interpreter was used.    HPI Comments: Robert Durham is a 56 y.o. male with history of kidney transplant, hypertension and diabetes mellitus who presents to the Emergency Department complaining of persistent nausea with some episodes of vomiting, and intermittent, moderate, non-radiating, dull lower abdominal pain onset 2 days ago. There is associated chills, generalized weakness, and intermittent shortness of breath. Patient also complains of bilateral, posterior, moderate, persistent, non-radiating neck pain. Patient denies any injury or trauma to the neck or surgical history. Patient denies fever, hematuria, dysuria, frequency, urgency, peripheral swelling, or chest pain. 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 Patient reports that he takes kidney medications as instructed.    Past Medical History  Diagnosis Date  . Renal disorder   . Hypertension   . Depression   . Urinary retention   . Diabetes mellitus     Past Surgical History  Procedure Date  . Kidney transplant   . Hernia repair   . Nephrectomy transplanted organ     Family History  Problem Relation Age of Onset  . Hypertension Sister     History  Substance Use Topics  . Smoking status: Former Games developer  . Smokeless tobacco: Never Used  . Alcohol Use: No     Comment: occasionally      Review of Systems  Constitutional: Positive for chills. Negative for fever.  HENT: Positive for neck pain.   Respiratory: Positive for shortness of breath.    Cardiovascular: Negative for chest pain and leg swelling.  Gastrointestinal: Positive for nausea, vomiting and abdominal pain.  Genitourinary: Negative for dysuria, urgency, frequency and hematuria.  Neurological: Positive for weakness (generalized).  All other systems reviewed and are negative.    Allergies  Codeine and Vasotec  Home Medications   Current Outpatient Rx  Name  Route  Sig  Dispense  Refill  . AMLODIPINE BESYLATE 2.5 MG PO TABS   Oral   Take 2.5 mg by mouth every morning.          . ATORVASTATIN CALCIUM 10 MG PO TABS   Oral   Take 10 mg by mouth every morning.          . CYCLOBENZAPRINE HCL 10 MG PO TABS   Oral   Take 1 tablet (10 mg total) by mouth 2 (two) times daily as needed for muscle spasms.   15 tablet   0   . CYCLOSPORINE 100 MG PO CAPS   Oral   Take 125 mg by mouth 2 (two) times daily. Take with 25 mg tablet to equal 125 mg twice daily         . CYCLOSPORINE 25 MG PO CAPS   Oral   Take 125 mg by mouth 2 (two) times daily. Take with 100 mg tablet to equal dose of 125 mg twice daily         . ESOMEPRAZOLE MAGNESIUM 40 MG PO CPDR   Oral   Take 40 mg by mouth 2 (  two) times daily.          Marland Kitchen METOCLOPRAMIDE HCL 10 MG PO TABS   Oral   Take 1 tablet (10 mg total) by mouth every 6 (six) hours.   30 tablet   0   . OXYCODONE-ACETAMINOPHEN 5-325 MG PO TABS   Oral   Take 1-2 tablets by mouth every 6 (six) hours as needed for pain.   15 tablet   0   . PREDNISONE 5 MG PO TABS   Oral   Take 5 mg by mouth every morning.          Marland Kitchen PROMETHAZINE HCL 25 MG PO TABS   Oral   Take 25 mg by mouth every 12 (twelve) hours as needed. Nausea.         Marland Kitchen TAMSULOSIN HCL 0.4 MG PO CAPS   Oral   Take 0.4 mg by mouth every morning.            Triage Vitals: BP 100/64  Pulse 88  Temp 98.3 F (36.8 C) (Oral)  Resp 20  Ht 5\' 11"  (1.803 m)  Wt 179 lb (81.194 kg)  BMI 24.97 kg/m2  SpO2 100%  Physical Exam  Nursing note and vitals  reviewed. Constitutional: He is oriented to person, place, and time. He appears well-developed and well-nourished. No distress.  HENT:  Head: Normocephalic and atraumatic.  Eyes: Conjunctivae normal are normal. Right eye exhibits no discharge. Left eye exhibits no discharge.  Neck: Neck supple.  Cardiovascular: Normal rate, regular rhythm and normal heart sounds.  Exam reveals no gallop and no friction rub.   No murmur heard. Pulmonary/Chest: Effort normal and breath sounds normal. No respiratory distress.  Abdominal: Soft. He exhibits no distension. There is no tenderness. There is no rebound and no guarding.  Musculoskeletal: He exhibits no edema and no tenderness.       Mild tenderness in bilateral traps. No midline c-spine tenderness. No concerning skin changes.   No lower extremity edema.  Lymphadenopathy:    He has no cervical adenopathy.  Neurological: He is alert and oriented to person, place, and time.  Skin: Skin is warm and dry.  Psychiatric: He has a normal mood and affect. His behavior is normal. Thought content normal.    ED Course  Procedures (including critical care time) DIAGNOSTIC STUDIES: Oxygen Saturation is 100% on room air, normal by my interpretation.    COORDINATION OF CARE: 6:41 PM- Patient informed of current plan for treatment and evaluation and agrees with plan at this time.  Results for orders placed during the hospital encounter of 05/21/12  GLUCOSE, CAPILLARY      Component Value Range   Glucose-Capillary 112 (*) 70 - 99 mg/dL  BASIC METABOLIC PANEL      Component Value Range   Sodium 135  135 - 145 mEq/L   Potassium 4.0  3.5 - 5.1 mEq/L   Chloride 101  96 - 112 mEq/L   CO2 26  19 - 32 mEq/L   Glucose, Bld 96  70 - 99 mg/dL   BUN 15  6 - 23 mg/dL   Creatinine, Ser 4.09 (*) 0.50 - 1.35 mg/dL   Calcium 9.6  8.4 - 81.1 mg/dL   GFR calc non Af Amer 48 (*) >90 mL/min   GFR calc Af Amer 56 (*) >90 mL/min  CBC      Component Value Range   WBC 6.4   4.0 - 10.5 K/uL   RBC 6.01 (*) 4.22 -  5.81 MIL/uL   Hemoglobin 13.5  13.0 - 17.0 g/dL   HCT 16.1  09.6 - 04.5 %   MCV 68.7 (*) 78.0 - 100.0 fL   MCH 22.5 (*) 26.0 - 34.0 pg   MCHC 32.7  30.0 - 36.0 g/dL   RDW 40.9  81.1 - 91.4 %   Platelets 142 (*) 150 - 400 K/uL  URINALYSIS, ROUTINE W REFLEX MICROSCOPIC      Component Value Range   Color, Urine YELLOW  YELLOW   APPearance CLEAR  CLEAR   Specific Gravity, Urine 1.020  1.005 - 1.030   pH 7.0  5.0 - 8.0   Glucose, UA NEGATIVE  NEGATIVE mg/dL   Hgb urine dipstick NEGATIVE  NEGATIVE   Bilirubin Urine NEGATIVE  NEGATIVE   Ketones, ur NEGATIVE  NEGATIVE mg/dL   Protein, ur NEGATIVE  NEGATIVE mg/dL   Urobilinogen, UA 1.0  0.0 - 1.0 mg/dL   Nitrite NEGATIVE  NEGATIVE   Leukocytes, UA NEGATIVE  NEGATIVE     No results found.   1. Neck pain   2. Nausea   3. Renal insufficiency       MDM  56 year old male with neck pain and nausea. Chronic in nature. Nonfocal neuro exam. Benign abdominal exam. Workup significant for renal insufficiency. Patient is status post kidney transplant. Patient has been in this range previously. Discuss with him that he needs to have this closely monitored. Emergent return precautions discussed.      I personally preformed the services scribed in my presence. The recorded information has been reviewed is accurate. Robert Razor, MD.    Robert Razor, MD 05/25/12 [phone]

## 2012-05-21 NOTE — ED Notes (Signed)
Discharge instructions given and reviewed with patient.  Prescriptions given for Zofran and Percocet; effects and use explained.  Patient verbalized understanding of sedating effects of Percocet and to use Zofran for nausea.  Patient ambulatory with steady gait; discharged home in good condition.

## 2012-05-21 NOTE — ED Notes (Signed)
abd pain with vomiting and neck pain x 2 days.

## 2012-05-22 DIAGNOSIS — I10 Essential (primary) hypertension: Secondary | ICD-10-CM | POA: Diagnosis not present

## 2012-06-01 DIAGNOSIS — I119 Hypertensive heart disease without heart failure: Secondary | ICD-10-CM | POA: Diagnosis not present

## 2012-06-01 DIAGNOSIS — J410 Simple chronic bronchitis: Secondary | ICD-10-CM | POA: Diagnosis not present

## 2012-06-01 DIAGNOSIS — E785 Hyperlipidemia, unspecified: Secondary | ICD-10-CM | POA: Diagnosis not present

## 2012-06-04 ENCOUNTER — Emergency Department (HOSPITAL_COMMUNITY)
Admission: EM | Admit: 2012-06-04 | Discharge: 2012-06-04 | Disposition: A | Discharge status: Home / Self Care | Payer: Medicare Other | Attending: Emergency Medicine | Admitting: Emergency Medicine

## 2012-06-04 ENCOUNTER — Encounter (HOSPITAL_COMMUNITY): Payer: Self-pay | Admitting: Emergency Medicine

## 2012-06-04 DIAGNOSIS — Z87891 Personal history of nicotine dependence: Secondary | ICD-10-CM | POA: Insufficient documentation

## 2012-06-04 DIAGNOSIS — Z79899 Other long term (current) drug therapy: Secondary | ICD-10-CM | POA: Insufficient documentation

## 2012-06-04 DIAGNOSIS — IMO0001 Reserved for inherently not codable concepts without codable children: Secondary | ICD-10-CM | POA: Insufficient documentation

## 2012-06-04 DIAGNOSIS — Z87448 Personal history of other diseases of urinary system: Secondary | ICD-10-CM | POA: Insufficient documentation

## 2012-06-04 DIAGNOSIS — R110 Nausea: Secondary | ICD-10-CM | POA: Diagnosis not present

## 2012-06-04 DIAGNOSIS — M791 Myalgia, unspecified site: Secondary | ICD-10-CM

## 2012-06-04 DIAGNOSIS — R197 Diarrhea, unspecified: Secondary | ICD-10-CM

## 2012-06-04 DIAGNOSIS — Z8659 Personal history of other mental and behavioral disorders: Secondary | ICD-10-CM | POA: Insufficient documentation

## 2012-06-04 DIAGNOSIS — R61 Generalized hyperhidrosis: Secondary | ICD-10-CM | POA: Diagnosis not present

## 2012-06-04 DIAGNOSIS — I10 Essential (primary) hypertension: Secondary | ICD-10-CM | POA: Diagnosis not present

## 2012-06-04 DIAGNOSIS — E119 Type 2 diabetes mellitus without complications: Secondary | ICD-10-CM | POA: Insufficient documentation

## 2012-06-04 DIAGNOSIS — R52 Pain, unspecified: Secondary | ICD-10-CM | POA: Diagnosis not present

## 2012-06-04 LAB — CBC WITH DIFFERENTIAL/PLATELET
Basophils Relative: 0 % (ref 0–1)
Eosinophils Absolute: 0.2 10*3/uL (ref 0.0–0.7)
Eosinophils Relative: 4 % (ref 0–5)
Lymphs Abs: 2.7 10*3/uL (ref 0.7–4.0)
MCH: 22.6 pg — ABNORMAL LOW (ref 26.0–34.0)
MCHC: 32.9 g/dL (ref 30.0–36.0)
MCV: 68.6 fL — ABNORMAL LOW (ref 78.0–100.0)
Neutrophils Relative %: 45 % (ref 43–77)
Platelets: 145 10*3/uL — ABNORMAL LOW (ref 150–400)
RBC: 5.67 MIL/uL (ref 4.22–5.81)
RDW: 15.3 % (ref 11.5–15.5)

## 2012-06-04 LAB — BASIC METABOLIC PANEL
BUN: 17 mg/dL (ref 6–23)
Creatinine, Ser: 1.45 mg/dL — ABNORMAL HIGH (ref 0.50–1.35)
GFR calc non Af Amer: 52 mL/min — ABNORMAL LOW (ref >90)
Glucose, Bld: 76 mg/dL (ref 70–99)
Potassium: 3.5 mEq/L (ref 3.5–5.1)

## 2012-06-04 LAB — GLUCOSE, CAPILLARY

## 2012-06-04 MED ORDER — ONDANSETRON HCL 4 MG PO TABS: 4.0000 mg | ORAL_TABLET | Freq: Four times a day (QID) | ORAL | Status: DC | PRN

## 2012-06-04 MED ORDER — SODIUM CHLORIDE 0.9 % IV BOLUS (SEPSIS)
1000.0000 mL | Freq: Once | INTRAVENOUS | Status: AC
  Administered 2012-06-04: 1000 mL via INTRAVENOUS

## 2012-06-04 MED ORDER — ONDANSETRON HCL 4 MG/2ML IJ SOLN
4.0000 mg | Freq: Once | INTRAMUSCULAR | Status: AC
  Administered 2012-06-04: 4 mg via INTRAVENOUS
  Filled 2012-06-04: qty 2

## 2012-06-04 MED ORDER — TRAMADOL HCL 50 MG PO TABS: 50.0000 mg | ORAL_TABLET | Freq: Four times a day (QID) | ORAL | Status: DC | PRN

## 2012-06-04 MED ORDER — MORPHINE SULFATE 4 MG/ML IJ SOLN
4.0000 mg | Freq: Once | INTRAMUSCULAR | Status: AC
  Administered 2012-06-04: 4 mg via INTRAVENOUS
  Filled 2012-06-04: qty 1

## 2012-06-04 NOTE — ED Notes (Signed)
Pt c/o body aches, hot and cold, nausea, 2 watery stools x 2 days. Nad. Mm slightly moist.

## 2012-06-04 NOTE — ED Provider Notes (Signed)
History   This chart was scribed for Dione Booze, MD by [name], ED Scribe. The patient was seen in room APA03/APA03. Patient's care was started at 0914.  CSN: [id number]  Arrival date & time 06/04/12  6578   First MD Initiated Contact with Patient 06/04/12 [phone]      Chief Complaint  Patient presents with  . Generalized Body Aches  . Nausea    HPI  Robert Durham is a 56 y.o. male with h/o HTN, DM, and Kidney transplant, who presents to the Emergency Department complaining of 2 days of new, gradual onset, constant, diarrhea with diffuse body aches, chills, and subjective fever. Pain is 10/10. No aggravators or alleviators. Symptoms have not been treated PTA. No abdominal pain, cough, congestion, rhinorrhea, chest pain, SOB, or emesis. Pt is a former smoker, who denies alcohol and illicit drug use.   Past Medical History  Diagnosis Date  . Renal disorder   . Hypertension   . Depression   . Urinary retention   . Diabetes mellitus     Past Surgical History  Procedure Date  . Kidney transplant   . Hernia repair   . Nephrectomy transplanted organ     Family History  Problem Relation Age of Onset  . Hypertension Sister     History  Substance Use Topics  . Smoking status: Former Games developer  . Smokeless tobacco: Never Used  . Alcohol Use: No     Comment: occasionally      Review of Systems  Constitutional: Positive for chills and diaphoresis.  Gastrointestinal: Positive for nausea and diarrhea. Negative for constipation.  Musculoskeletal: Positive for myalgias.  Neurological: Negative for weakness.  All other systems reviewed and are negative.    Allergies  Codeine and Vasotec  Home Medications   Current Outpatient Rx  Name  Route  Sig  Dispense  Refill  . AMLODIPINE BESYLATE 2.5 MG PO TABS   Oral   Take 2.5 mg by mouth every morning.          . ATORVASTATIN CALCIUM 10 MG PO TABS   Oral   Take 10 mg by mouth every morning.          .  CYCLOSPORINE 100 MG PO CAPS   Oral   Take 125 mg by mouth 2 (two) times daily. Take with 25 mg tablet to equal 125 mg twice daily         . CYCLOSPORINE 25 MG PO CAPS   Oral   Take 125 mg by mouth 2 (two) times daily. Take with 100 mg tablet to equal dose of 125 mg twice daily         . ESOMEPRAZOLE MAGNESIUM 40 MG PO CPDR   Oral   Take 40 mg by mouth 2 (two) times daily.          Marland Kitchen ONDANSETRON HCL 4 MG PO TABS   Oral   Take 1 tablet (4 mg total) by mouth every 8 (eight) hours as needed for nausea.   12 tablet   0   . OXYCODONE-ACETAMINOPHEN 5-325 MG PO TABS   Oral   Take 1 tablet by mouth every 4 (four) hours as needed for pain.   10 tablet   0   . PREDNISONE 5 MG PO TABS   Oral   Take 5 mg by mouth every morning.          Marland Kitchen TAMSULOSIN HCL 0.4 MG PO CAPS   Oral   Take 0.4  mg by mouth every morning.          Marland Kitchen TRAMADOL HCL 50 MG PO TABS   Oral   Take 50 mg by mouth 2 (two) times daily.           BP 96/69  Pulse 74  Temp 98.3 F (36.8 C) (Oral)  Resp 17  Ht 5\' 11"  (1.803 m)  Wt 179 lb (81.194 kg)  BMI 24.97 kg/m2  SpO2 100%  Physical Exam  Nursing note and vitals reviewed. Constitutional: He is oriented to person, place, and time. He appears well-developed and well-nourished.  HENT:  Head: Normocephalic and atraumatic.  Eyes: EOM are normal. Pupils are equal, round, and reactive to light.  Neck: Normal range of motion. Neck supple.  Cardiovascular: Normal rate and regular rhythm.   Pulmonary/Chest: Effort normal and breath sounds normal.  Abdominal: Soft. Bowel sounds are normal.       Transplanted kidney, which is palpable to LLQ. Non-tender. Bowel sounds are decreased.  Musculoskeletal: Normal range of motion.  Neurological: He is alert and oriented to person, place, and time.  Skin: Skin is warm and dry.  Psychiatric: He has a normal mood and affect. His behavior is normal.    ED Course  Procedures DIAGNOSTIC STUDIES: Oxygen Saturation  is 100% on room air, normal by my interpretation.    COORDINATION OF CARE: 09:28- Evaluated Pt. Pt is awake, alert, and without distress. 09:32- Ordered Basic metabolic panel and CBC with Differential STAT.     Results for orders placed during the hospital encounter of 06/04/12  CBC WITH DIFFERENTIAL      Component Value Range   WBC 6.4  4.0 - 10.5 K/uL   RBC 5.67  4.22 - 5.81 MIL/uL   Hemoglobin 12.8 (*) 13.0 - 17.0 g/dL   HCT 40.9 (*) 81.1 - 91.4 %   MCV 68.6 (*) 78.0 - 100.0 fL   MCH 22.6 (*) 26.0 - 34.0 pg   MCHC 32.9  30.0 - 36.0 g/dL   RDW 78.2  95.6 - 21.3 %   Platelets 145 (*) 150 - 400 K/uL   Neutrophils Relative 45  43 - 77 %   Neutro Abs 2.9  1.7 - 7.7 K/uL   Lymphocytes Relative 43  12 - 46 %   Lymphs Abs 2.7  0.7 - 4.0 K/uL   Monocytes Relative 8  3 - 12 %   Monocytes Absolute 0.5  0.1 - 1.0 K/uL   Eosinophils Relative 4  0 - 5 %   Eosinophils Absolute 0.2  0.0 - 0.7 K/uL   Basophils Relative 0  0 - 1 %   Basophils Absolute 0.0  0.0 - 0.1 K/uL   WBC Morphology ATYPICAL LYMPHOCYTES     RBC Morphology POLYCHROMASIA PRESENT     Smear Review PERFORMED    BASIC METABOLIC PANEL      Component Value Range   Sodium 135  135 - 145 mEq/L   Potassium 3.5  3.5 - 5.1 mEq/L   Chloride 101  96 - 112 mEq/L   CO2 25  19 - 32 mEq/L   Glucose, Bld 76  70 - 99 mg/dL   BUN 17  6 - 23 mg/dL   Creatinine, Ser 0.86 (*) 0.50 - 1.35 mg/dL   Calcium 8.9  8.4 - 57.8 mg/dL   GFR calc non Af Amer 52 (*) >90 mL/min   GFR calc Af Amer 60 (*) >90 mL/min  GLUCOSE, CAPILLARY  Component Value Range   Glucose-Capillary 87  70 - 99 mg/dL   Comment 1 Documented in Chart     Comment 2 Notify RN      1. Diarrhea   2. Myalgia       MDM  Diarrhea, nausea, body aches most consistent with a viral syndrome. Because of history of kidney transplant, creatinine will be checked and will be given IV fluids. He states he does not have any sense of impending diarrhea currently, so he'll not  be given any antidiarrhea agents.  He feels much better after above noted treatment. Creatinine is stable. Atypical lymphocytes and right shift on her noted on CBC consistent with viral syndrome. He is discharged with prescription for ondansetron for nausea and tramadol for pain and he is to use over-the-counter loperamide as needed.     I personally performed the services described in this documentation, which was scribed in my presence. The recorded information has been reviewed and is accurate.     Dione Booze, MD 06/04/12 1146

## 2012-06-06 ENCOUNTER — Emergency Department (HOSPITAL_COMMUNITY)
Admission: EM | Admit: 2012-06-06 | Discharge: 2012-06-06 | Disposition: A | Discharge status: Home / Self Care | Payer: Medicare Other | Attending: Emergency Medicine | Admitting: Emergency Medicine

## 2012-06-06 ENCOUNTER — Encounter (HOSPITAL_COMMUNITY): Payer: Self-pay | Admitting: *Deleted

## 2012-06-06 DIAGNOSIS — K5289 Other specified noninfective gastroenteritis and colitis: Secondary | ICD-10-CM | POA: Diagnosis not present

## 2012-06-06 DIAGNOSIS — Z79899 Other long term (current) drug therapy: Secondary | ICD-10-CM | POA: Diagnosis not present

## 2012-06-06 DIAGNOSIS — R52 Pain, unspecified: Secondary | ICD-10-CM | POA: Diagnosis not present

## 2012-06-06 DIAGNOSIS — R509 Fever, unspecified: Secondary | ICD-10-CM | POA: Insufficient documentation

## 2012-06-06 DIAGNOSIS — J029 Acute pharyngitis, unspecified: Secondary | ICD-10-CM | POA: Insufficient documentation

## 2012-06-06 DIAGNOSIS — Z87891 Personal history of nicotine dependence: Secondary | ICD-10-CM | POA: Diagnosis not present

## 2012-06-06 DIAGNOSIS — R1033 Periumbilical pain: Secondary | ICD-10-CM | POA: Insufficient documentation

## 2012-06-06 DIAGNOSIS — Z8659 Personal history of other mental and behavioral disorders: Secondary | ICD-10-CM | POA: Diagnosis not present

## 2012-06-06 DIAGNOSIS — J3489 Other specified disorders of nose and nasal sinuses: Secondary | ICD-10-CM | POA: Insufficient documentation

## 2012-06-06 DIAGNOSIS — I10 Essential (primary) hypertension: Secondary | ICD-10-CM | POA: Diagnosis not present

## 2012-06-06 DIAGNOSIS — E119 Type 2 diabetes mellitus without complications: Secondary | ICD-10-CM | POA: Diagnosis not present

## 2012-06-06 DIAGNOSIS — K529 Noninfective gastroenteritis and colitis, unspecified: Secondary | ICD-10-CM

## 2012-06-06 DIAGNOSIS — Z87448 Personal history of other diseases of urinary system: Secondary | ICD-10-CM | POA: Insufficient documentation

## 2012-06-06 DIAGNOSIS — R109 Unspecified abdominal pain: Secondary | ICD-10-CM | POA: Diagnosis not present

## 2012-06-06 LAB — CBC WITH DIFFERENTIAL/PLATELET
Eosinophils Relative: 1 % (ref 0–5)
HCT: 39.7 % (ref 39.0–52.0)
Hemoglobin: 13.1 g/dL (ref 13.0–17.0)
Lymphocytes Relative: 21 % (ref 12–46)
MCHC: 33 g/dL (ref 30.0–36.0)
MCV: 68.6 fL — ABNORMAL LOW (ref 78.0–100.0)
Monocytes Absolute: 0.8 10*3/uL (ref 0.1–1.0)
Monocytes Relative: 13 % — ABNORMAL HIGH (ref 3–12)
Neutro Abs: 4.1 10*3/uL (ref 1.7–7.7)
WBC: 6.2 10*3/uL (ref 4.0–10.5)

## 2012-06-06 LAB — URINALYSIS, ROUTINE W REFLEX MICROSCOPIC
Bilirubin Urine: NEGATIVE
Glucose, UA: NEGATIVE mg/dL
Hgb urine dipstick: NEGATIVE
Ketones, ur: NEGATIVE mg/dL
Leukocytes, UA: NEGATIVE
pH: 6.5 (ref 5.0–8.0)

## 2012-06-06 LAB — BASIC METABOLIC PANEL
BUN: 14 mg/dL (ref 6–23)
CO2: 25 mEq/L (ref 19–32)
Chloride: 102 mEq/L (ref 96–112)
Creatinine, Ser: 1.68 mg/dL — ABNORMAL HIGH (ref 0.50–1.35)

## 2012-06-06 MED ORDER — FENTANYL CITRATE 0.05 MG/ML IJ SOLN
50.0000 ug | Freq: Once | INTRAMUSCULAR | Status: AC
  Administered 2012-06-06: 50 ug via INTRAVENOUS
  Filled 2012-06-06: qty 2

## 2012-06-06 MED ORDER — PROMETHAZINE HCL 25 MG PO TABS: 25.0000 mg | ORAL_TABLET | Freq: Four times a day (QID) | ORAL | Status: DC | PRN

## 2012-06-06 MED ORDER — ONDANSETRON HCL 4 MG/2ML IJ SOLN
4.0000 mg | Freq: Once | INTRAMUSCULAR | Status: AC
  Administered 2012-06-06: 4 mg via INTRAVENOUS
  Filled 2012-06-06: qty 2

## 2012-06-06 MED ORDER — SODIUM CHLORIDE 0.9 % IV BOLUS (SEPSIS)
1000.0000 mL | Freq: Once | INTRAVENOUS | Status: AC
  Administered 2012-06-06: 1000 mL via INTRAVENOUS

## 2012-06-06 NOTE — ED Notes (Signed)
Pt seen here 12/22 for n/v, pt continues to have same, states the medication he received has not helped any. Pt states he is not able to hold down fluids. Not4es generalized abd pain and body aches

## 2012-06-06 NOTE — ED Notes (Signed)
Pt alert & oriented x4, stable gait. Patient given discharge instructions, paperwork & prescription(s). Patient  instructed to stop at the registration desk to finish any additional paperwork. Patient verbalized understanding. Pt left department w/ no further questions. 

## 2012-06-06 NOTE — ED Provider Notes (Signed)
History   This chart was scribed for Robert Hutching, MD by [name], ED Scribe. The patient was seen in room APA17/APA17. Patient's care was started at 1815.  CSN: [id number]  Arrival date & time 06/06/12  1815   First MD Initiated Contact with Patient 06/06/12 1830      Chief Complaint  Patient presents with  . Emesis    HPI  Robert Durham is a 56 y.o. male with h/o HTN, DM, and Kidney transplant, who presents to the Emergency Department complaining of 4 days of recurrent, gradual onset, waxing and waning, nausea and periumbilical pain, with diffuse body aches, chills, and subjective fever. No aggravators or alleviators. Pt was evaluated at [facility] ED 2 day ago fro similar symptoms, and Rx with ondansetron for nausea and tramadol for pain. Pt reports improvement for one day, though he currently reports no relief despite use of prescriptions. Bowel and bladder are normal. No diarrhea cough, congestion, rhinorrhea, chest pain, SOB, or emesis. Pt is a former smoker, who denies alcohol and illicit drug use.    Past Medical History  Diagnosis Date  . Hypertension   . Depression   . Urinary retention   . Diabetes mellitus   . Renal disorder     Past Surgical History  Procedure Date  . Kidney transplant   . Hernia repair   . Nephrectomy transplanted organ     Family History  Problem Relation Age of Onset  . Hypertension Sister     History  Substance Use Topics  . Smoking status: Former Games developer  . Smokeless tobacco: Never Used  . Alcohol Use: No     Comment: occasionally    Review of Systems  Constitutional: Positive for chills.  HENT: Positive for sore throat and rhinorrhea.   Gastrointestinal: Positive for nausea. Negative for diarrhea.    Allergies  Codeine and Vasotec  Home Medications   Current Outpatient Rx  Name  Route  Sig  Dispense  Refill  . AMLODIPINE BESYLATE 2.5 MG PO TABS   Oral   Take 2.5 mg by mouth every morning.          . ATORVASTATIN  CALCIUM 10 MG PO TABS   Oral   Take 10 mg by mouth every morning.          . CYCLOSPORINE 100 MG PO CAPS   Oral   Take 125 mg by mouth 2 (two) times daily. Take with 25 mg tablet to equal 125 mg twice daily         . CYCLOSPORINE 25 MG PO CAPS   Oral   Take 125 mg by mouth 2 (two) times daily. Take with 100 mg tablet to equal dose of 125 mg twice daily         . ESOMEPRAZOLE MAGNESIUM 40 MG PO CPDR   Oral   Take 40 mg by mouth 2 (two) times daily.          Marland Kitchen ONDANSETRON HCL 4 MG PO TABS   Oral   Take 1 tablet (4 mg total) by mouth every 6 (six) hours as needed for nausea.   12 tablet   0   . PREDNISONE 5 MG PO TABS   Oral   Take 5 mg by mouth every morning.          Marland Kitchen TAMSULOSIN HCL 0.4 MG PO CAPS   Oral   Take 0.4 mg by mouth every morning.          Marland Kitchen  TRAMADOL HCL 50 MG PO TABS   Oral   Take 1 tablet (50 mg total) by mouth every 6 (six) hours as needed for pain.   15 tablet   0     BP 95/62  Pulse 91  Temp 98.2 F (36.8 C) (Oral)  Resp 18  Ht 5\' 11"  (1.803 m)  Wt 179 lb (81.194 kg)  BMI 24.97 kg/m2  SpO2 100%  Physical Exam  Nursing note and vitals reviewed. Constitutional: He is oriented to person, place, and time. He appears well-developed and well-nourished.  HENT:  Head: Normocephalic and atraumatic.  Eyes: Conjunctivae normal and EOM are normal. Pupils are equal, round, and reactive to light.  Neck: Normal range of motion. Neck supple.  Cardiovascular: Normal rate, regular rhythm and normal heart sounds.   Pulmonary/Chest: Effort normal and breath sounds normal.  Abdominal: Soft. Bowel sounds are normal.       Minimal periumbilical tenderness.  Musculoskeletal: Normal range of motion.  Neurological: He is alert and oriented to person, place, and time.  Skin: Skin is warm and dry.  Psychiatric: He has a normal mood and affect.    ED Course  Procedures DIAGNOSTIC STUDIES: Oxygen Saturation is 100% on room air, use by my  interpretation.    COORDINATION OF CARE: 18:45- Evaluated Pt. Pt is awake, alert, and without distress. 18:50- Patient understand and agree with initial ED impression and plan with expectations set for ED visit. 18:51- Ordered Basic metabolic panel, CBC with Differential, and Urinalysis, Routine w reflex microscopic. 19:00- Ordered Fentanyl 50 mcg once and Zofran 4 mg injection.   Labs Reviewed  CBC WITH DIFFERENTIAL - Abnormal; Notable for the following:    MCV 68.6 (*)     MCH 22.6 (*)     Platelets 132 (*)     Monocytes Relative 13 (*)     All other components within normal limits  BASIC METABOLIC PANEL - Abnormal; Notable for the following:    Creatinine, Ser 1.68 (*)     GFR calc non Af Amer 44 (*)     GFR calc Af Amer 51 (*)     All other components within normal limits  URINALYSIS, ROUTINE W REFLEX MICROSCOPIC   No results found.   No diagnosis found.    MDM  Patient is hemodynamically stable. Slightly elevated creatinine noted. Patient will get this rechecked.  Rx Phenergan 25 mg # 20     I personally performed the services described in this documentation, which was scribed in my presence. The recorded information has been reviewed and is accurate.    Robert Hutching, MD 06/06/12 2245

## 2012-06-06 NOTE — ED Notes (Signed)
N/V no diarrhea, seen here 12/22 , but no better,  Chills,  Cough,  Runny nose, no sore throat.Body aches

## 2012-06-09 ENCOUNTER — Emergency Department (HOSPITAL_COMMUNITY)
Admission: EM | Admit: 2012-06-09 | Discharge: 2012-06-09 | Disposition: A | Discharge status: Home / Self Care | Payer: Medicare Other | Attending: Emergency Medicine | Admitting: Emergency Medicine

## 2012-06-09 ENCOUNTER — Other Ambulatory Visit: Payer: Self-pay

## 2012-06-09 ENCOUNTER — Encounter (HOSPITAL_COMMUNITY): Payer: Self-pay | Admitting: Emergency Medicine

## 2012-06-09 ENCOUNTER — Emergency Department (HOSPITAL_COMMUNITY): Payer: Medicare Other

## 2012-06-09 DIAGNOSIS — Z940 Kidney transplant status: Secondary | ICD-10-CM | POA: Diagnosis not present

## 2012-06-09 DIAGNOSIS — R05 Cough: Secondary | ICD-10-CM | POA: Insufficient documentation

## 2012-06-09 DIAGNOSIS — R059 Cough, unspecified: Secondary | ICD-10-CM | POA: Diagnosis not present

## 2012-06-09 DIAGNOSIS — Z79899 Other long term (current) drug therapy: Secondary | ICD-10-CM | POA: Insufficient documentation

## 2012-06-09 DIAGNOSIS — F3289 Other specified depressive episodes: Secondary | ICD-10-CM | POA: Insufficient documentation

## 2012-06-09 DIAGNOSIS — F329 Major depressive disorder, single episode, unspecified: Secondary | ICD-10-CM | POA: Insufficient documentation

## 2012-06-09 DIAGNOSIS — Z792 Long term (current) use of antibiotics: Secondary | ICD-10-CM | POA: Insufficient documentation

## 2012-06-09 DIAGNOSIS — R109 Unspecified abdominal pain: Secondary | ICD-10-CM | POA: Insufficient documentation

## 2012-06-09 DIAGNOSIS — Z87448 Personal history of other diseases of urinary system: Secondary | ICD-10-CM | POA: Insufficient documentation

## 2012-06-09 DIAGNOSIS — I10 Essential (primary) hypertension: Secondary | ICD-10-CM | POA: Diagnosis not present

## 2012-06-09 DIAGNOSIS — R34 Anuria and oliguria: Secondary | ICD-10-CM | POA: Insufficient documentation

## 2012-06-09 DIAGNOSIS — R630 Anorexia: Secondary | ICD-10-CM | POA: Diagnosis not present

## 2012-06-09 DIAGNOSIS — R259 Unspecified abnormal involuntary movements: Secondary | ICD-10-CM | POA: Diagnosis not present

## 2012-06-09 DIAGNOSIS — E119 Type 2 diabetes mellitus without complications: Secondary | ICD-10-CM | POA: Insufficient documentation

## 2012-06-09 DIAGNOSIS — B9789 Other viral agents as the cause of diseases classified elsewhere: Secondary | ICD-10-CM | POA: Insufficient documentation

## 2012-06-09 DIAGNOSIS — B349 Viral infection, unspecified: Secondary | ICD-10-CM

## 2012-06-09 DIAGNOSIS — R509 Fever, unspecified: Secondary | ICD-10-CM | POA: Diagnosis not present

## 2012-06-09 DIAGNOSIS — R51 Headache: Secondary | ICD-10-CM | POA: Insufficient documentation

## 2012-06-09 DIAGNOSIS — IMO0002 Reserved for concepts with insufficient information to code with codable children: Secondary | ICD-10-CM | POA: Insufficient documentation

## 2012-06-09 DIAGNOSIS — R52 Pain, unspecified: Secondary | ICD-10-CM | POA: Diagnosis not present

## 2012-06-09 LAB — URINALYSIS, ROUTINE W REFLEX MICROSCOPIC
Bilirubin Urine: NEGATIVE
Nitrite: NEGATIVE
Protein, ur: NEGATIVE mg/dL
pH: 8 (ref 5.0–8.0)

## 2012-06-09 LAB — CBC WITH DIFFERENTIAL/PLATELET
Eosinophils Relative: 1 % (ref 0–5)
HCT: 41.4 % (ref 39.0–52.0)
Lymphs Abs: 0.9 10*3/uL (ref 0.7–4.0)
MCH: 22.7 pg — ABNORMAL LOW (ref 26.0–34.0)
MCV: 69 fL — ABNORMAL LOW (ref 78.0–100.0)
Monocytes Absolute: 0.9 10*3/uL (ref 0.1–1.0)
Monocytes Relative: 12 % (ref 3–12)
Neutro Abs: 6 10*3/uL (ref 1.7–7.7)
RBC: 6 MIL/uL — ABNORMAL HIGH (ref 4.22–5.81)
WBC: 7.9 10*3/uL (ref 4.0–10.5)

## 2012-06-09 LAB — COMPREHENSIVE METABOLIC PANEL
ALT: 14 U/L (ref 0–53)
Alkaline Phosphatase: 58 U/L (ref 39–117)
BUN: 12 mg/dL (ref 6–23)
CO2: 27 mEq/L (ref 19–32)
Chloride: 98 mEq/L (ref 96–112)
GFR calc Af Amer: 52 mL/min — ABNORMAL LOW (ref >90)
GFR calc non Af Amer: 45 mL/min — ABNORMAL LOW (ref >90)
Glucose, Bld: 91 mg/dL (ref 70–99)
Potassium: 4 mEq/L (ref 3.5–5.1)
Sodium: 134 mEq/L — ABNORMAL LOW (ref 135–145)
Total Bilirubin: 0.5 mg/dL (ref 0.3–1.2)
Total Protein: 7.8 g/dL (ref 6.0–8.3)

## 2012-06-09 MED ORDER — SODIUM CHLORIDE 0.9 % IV BOLUS (SEPSIS)
1000.0000 mL | Freq: Once | INTRAVENOUS | Status: AC
  Administered 2012-06-09: 1000 mL via INTRAVENOUS

## 2012-06-09 NOTE — ED Notes (Addendum)
Pt c/o gradual onset of ha since yesterday and abd pain with cough and fever. Pt hyperventilating upon arrival to ED-nrb in place.

## 2012-06-09 NOTE — ED Provider Notes (Signed)
History   This chart was scribed for [name], MD, by Frederik Pear, ER scribe. The patient was seen in room APA18/APA18 and the patient's care was started at 1506.    CSN: [id number]  Arrival date & time 06/09/12  1453   First MD Initiated Contact with Patient 06/09/12 1506      Chief Complaint  Patient presents with  . Headache  . Fever  . Cough    (Consider location/radiation/quality/duration/timing/severity/associated sxs/prior treatment) HPI  Robert Durham is a 56 y.o. male who has a h/o of a kidney transplant in 1994 and presents to the Emergency Department complaining of a gradually worsening, constant headache with associated fever, tremors, abdominal pain, cough, decreased urination and decreased appetite and fluid intake that began yesterday. He was seen on 12/24 for similar symptoms. He denies any treatment prior to arrival.   Past Medical History  Diagnosis Date  . Hypertension   . Depression   . Urinary retention   . Diabetes mellitus   . Renal disorder     Past Surgical History  Procedure Date  . Kidney transplant   . Hernia repair   . Nephrectomy transplanted organ     Family History  Problem Relation Age of Onset  . Hypertension Sister     History  Substance Use Topics  . Smoking status: Former Games developer  . Smokeless tobacco: Never Used  . Alcohol Use: No     Comment: occasionally      Review of Systems  Constitutional: Positive for fever and appetite change.  Respiratory: Positive for cough.   Gastrointestinal: Positive for abdominal pain.  Genitourinary: Positive for decreased urine volume.  Neurological: Positive for tremors and headaches.  All other systems reviewed and are negative.    Allergies  Codeine and Vasotec  Home Medications   Current Outpatient Rx  Name  Route  Sig  Dispense  Refill  . AMLODIPINE BESYLATE 2.5 MG PO TABS   Oral   Take 2.5 mg by mouth every morning.          . ATORVASTATIN CALCIUM 10 MG PO  TABS   Oral   Take 10 mg by mouth every morning.          . CYCLOSPORINE 100 MG PO CAPS   Oral   Take 125 mg by mouth 2 (two) times daily. Take with 25 mg tablet to equal 125 mg twice daily         . CYCLOSPORINE 25 MG PO CAPS   Oral   Take 125 mg by mouth 2 (two) times daily. Take with 100 mg tablet to equal dose of 125 mg twice daily         . ESOMEPRAZOLE MAGNESIUM 40 MG PO CPDR   Oral   Take 40 mg by mouth 2 (two) times daily.          Marland Kitchen ONDANSETRON HCL 4 MG PO TABS   Oral   Take 1 tablet (4 mg total) by mouth every 6 (six) hours as needed for nausea.   12 tablet   0   . PREDNISONE 5 MG PO TABS   Oral   Take 5 mg by mouth every morning.          Marland Kitchen PROMETHAZINE HCL 25 MG PO TABS   Oral   Take 1 tablet (25 mg total) by mouth every 6 (six) hours as needed for nausea.   20 tablet   0   . TAMSULOSIN HCL 0.4 MG  PO CAPS   Oral   Take 0.4 mg by mouth every morning.          Marland Kitchen TRAMADOL HCL 50 MG PO TABS   Oral   Take 1 tablet (50 mg total) by mouth every 6 (six) hours as needed for pain.   15 tablet   0     BP 97/53  Pulse 94  Temp 100.1 F (37.8 C)  Resp 32  SpO2 99%  Physical Exam  Nursing note and vitals reviewed. Constitutional: He is oriented to person, place, and time. He appears well-developed and well-nourished.  HENT:  Head: Normocephalic and atraumatic.  Eyes: Conjunctivae normal and EOM are normal. Pupils are equal, round, and reactive to light.  Neck: Normal range of motion. Neck supple.  Cardiovascular: Normal rate, regular rhythm and normal heart sounds.   Pulmonary/Chest: Effort normal and breath sounds normal.  Abdominal: Soft. Bowel sounds are normal.  Musculoskeletal: Normal range of motion.  Neurological: He is alert and oriented to person, place, and time.       He has tremors.  Skin: Skin is warm and dry.  Psychiatric: He has a normal mood and affect.    ED Course  Procedures (including critical care time)  DIAGNOSTIC  STUDIES: Oxygen Saturation is 99% on room air, normal by my interpretation.    COORDINATION OF CARE:  15:51- Discussed planned course of treatment with the patient, including a CBC, metabolic panel, blood culture, UA, Lipase and 2 view chest X-ray, who is agreeable at this time.  16:15- Medication Orders- sodium chloride 0.9% bolus 1,000 mL.   Labs Reviewed  CBC WITH DIFFERENTIAL - Abnormal; Notable for the following:    RBC 6.00 (*)     MCV 69.0 (*)     MCH 22.7 (*)     Platelets 127 (*)     Lymphocytes Relative 11 (*)     All other components within normal limits  COMPREHENSIVE METABOLIC PANEL - Abnormal; Notable for the following:    Sodium 134 (*)     Creatinine, Ser 1.65 (*)     GFR calc non Af Amer 45 (*)     GFR calc Af Amer 52 (*)     All other components within normal limits  URINALYSIS, ROUTINE W REFLEX MICROSCOPIC - Abnormal; Notable for the following:    Ketones, ur TRACE (*)     All other components within normal limits  LIPASE, BLOOD  CULTURE, BLOOD (ROUTINE X 2)  CULTURE, BLOOD (ROUTINE X 2)   Dg Chest Portable 1 View  06/09/2012  *RADIOLOGY REPORT*  Clinical Data: Fever and chills  PORTABLE CHEST - 1 VIEW  Comparison: 07/08/2010 and previous  Findings: Artifact overlies the chest.  The patient has taken a poor inspiration.  No definite acute infiltrate.  There may be central bronchial thickening.  No effusions.  No significant bony finding.  IMPRESSION: Poor inspiration.  Possible bronchitis.  Consider two-view chest radiography when able for better evaluation of the lung parenchyma.   Original Report Authenticated By: Paulina Fusi, M.D.      No diagnosis found.    MDM  Patient feeling much better after IV fluids. Review of labs and x-ray showed no acute findings.  Creatinine stable at 1.65  I personally performed the services described in this documentation, which was scribed in my presence. The recorded information has been reviewed and is  accurate.        [name], MD 06/09/12 1921

## 2012-06-14 LAB — CULTURE, BLOOD (ROUTINE X 2)
Culture: NO GROWTH
Culture: NO GROWTH

## 2012-06-27 DIAGNOSIS — Z48298 Encounter for aftercare following other organ transplant: Secondary | ICD-10-CM | POA: Diagnosis not present

## 2012-06-27 DIAGNOSIS — D509 Iron deficiency anemia, unspecified: Secondary | ICD-10-CM | POA: Diagnosis not present

## 2012-06-27 DIAGNOSIS — D899 Disorder involving the immune mechanism, unspecified: Secondary | ICD-10-CM | POA: Diagnosis not present

## 2012-06-27 DIAGNOSIS — Z940 Kidney transplant status: Secondary | ICD-10-CM | POA: Diagnosis not present

## 2012-06-27 DIAGNOSIS — K2940 Chronic atrophic gastritis without bleeding: Secondary | ICD-10-CM | POA: Diagnosis not present

## 2012-06-27 DIAGNOSIS — IMO0002 Reserved for concepts with insufficient information to code with codable children: Secondary | ICD-10-CM | POA: Diagnosis not present

## 2012-06-27 DIAGNOSIS — Z9225 Personal history of immunosupression therapy: Secondary | ICD-10-CM | POA: Diagnosis not present

## 2012-06-27 DIAGNOSIS — Z792 Long term (current) use of antibiotics: Secondary | ICD-10-CM | POA: Diagnosis not present

## 2012-06-27 DIAGNOSIS — I120 Hypertensive chronic kidney disease with stage 5 chronic kidney disease or end stage renal disease: Secondary | ICD-10-CM | POA: Diagnosis not present

## 2012-06-27 DIAGNOSIS — N186 End stage renal disease: Secondary | ICD-10-CM | POA: Diagnosis not present

## 2012-06-27 DIAGNOSIS — I10 Essential (primary) hypertension: Secondary | ICD-10-CM | POA: Diagnosis not present

## 2012-07-24 DIAGNOSIS — Z9225 Personal history of immunosupression therapy: Secondary | ICD-10-CM | POA: Diagnosis not present

## 2012-07-24 DIAGNOSIS — Z940 Kidney transplant status: Secondary | ICD-10-CM | POA: Diagnosis not present

## 2012-08-24 ENCOUNTER — Encounter (HOSPITAL_COMMUNITY): Payer: Self-pay | Admitting: *Deleted

## 2012-08-24 ENCOUNTER — Emergency Department (HOSPITAL_COMMUNITY)
Admission: EM | Admit: 2012-08-24 | Discharge: 2012-08-24 | Disposition: A | Discharge status: Home / Self Care | Payer: Medicare Other | Attending: Emergency Medicine | Admitting: Emergency Medicine

## 2012-08-24 DIAGNOSIS — I10 Essential (primary) hypertension: Secondary | ICD-10-CM | POA: Diagnosis not present

## 2012-08-24 DIAGNOSIS — Z87891 Personal history of nicotine dependence: Secondary | ICD-10-CM | POA: Insufficient documentation

## 2012-08-24 DIAGNOSIS — Z87448 Personal history of other diseases of urinary system: Secondary | ICD-10-CM | POA: Diagnosis not present

## 2012-08-24 DIAGNOSIS — E119 Type 2 diabetes mellitus without complications: Secondary | ICD-10-CM | POA: Insufficient documentation

## 2012-08-24 DIAGNOSIS — M549 Dorsalgia, unspecified: Secondary | ICD-10-CM | POA: Insufficient documentation

## 2012-08-24 DIAGNOSIS — Z79899 Other long term (current) drug therapy: Secondary | ICD-10-CM | POA: Insufficient documentation

## 2012-08-24 DIAGNOSIS — R5381 Other malaise: Secondary | ICD-10-CM | POA: Diagnosis not present

## 2012-08-24 DIAGNOSIS — Z940 Kidney transplant status: Secondary | ICD-10-CM | POA: Insufficient documentation

## 2012-08-24 DIAGNOSIS — IMO0002 Reserved for concepts with insufficient information to code with codable children: Secondary | ICD-10-CM | POA: Diagnosis not present

## 2012-08-24 DIAGNOSIS — F3289 Other specified depressive episodes: Secondary | ICD-10-CM | POA: Insufficient documentation

## 2012-08-24 DIAGNOSIS — M79609 Pain in unspecified limb: Secondary | ICD-10-CM | POA: Diagnosis not present

## 2012-08-24 DIAGNOSIS — F329 Major depressive disorder, single episode, unspecified: Secondary | ICD-10-CM | POA: Diagnosis not present

## 2012-08-24 LAB — CBC WITH DIFFERENTIAL/PLATELET
Basophils Absolute: 0 10*3/uL (ref 0.0–0.1)
Basophils Relative: 1 % (ref 0–1)
Hemoglobin: 13.2 g/dL (ref 13.0–17.0)
MCHC: 32.2 g/dL (ref 30.0–36.0)
Neutro Abs: 2.9 10*3/uL (ref 1.7–7.7)
Neutrophils Relative %: 45 % (ref 43–77)
RDW: 15.4 % (ref 11.5–15.5)

## 2012-08-24 LAB — URINALYSIS, ROUTINE W REFLEX MICROSCOPIC
Nitrite: NEGATIVE
Specific Gravity, Urine: 1.02 (ref 1.005–1.030)
Urobilinogen, UA: 0.2 mg/dL (ref 0.0–1.0)

## 2012-08-24 LAB — BASIC METABOLIC PANEL
BUN: 18 mg/dL (ref 6–23)
Creatinine, Ser: 1.51 mg/dL — ABNORMAL HIGH (ref 0.50–1.35)
GFR calc Af Amer: 57 mL/min — ABNORMAL LOW (ref >90)
GFR calc non Af Amer: 50 mL/min — ABNORMAL LOW (ref >90)
Potassium: 3.7 mEq/L (ref 3.5–5.1)

## 2012-08-24 MED ORDER — HYDROCODONE-ACETAMINOPHEN 5-325 MG PO TABS
1.0000 | ORAL_TABLET | Freq: Once | ORAL | Status: AC
  Administered 2012-08-24: 1 via ORAL
  Filled 2012-08-24: qty 1

## 2012-08-24 MED ORDER — HYDROCODONE-ACETAMINOPHEN 5-325 MG PO TABS: 1.0000 | ORAL_TABLET | Freq: Three times a day (TID) | ORAL | Status: DC | PRN

## 2012-08-24 NOTE — ED Provider Notes (Signed)
History     CSN: [id number]  Arrival date & time 08/24/12  0509   First MD Initiated Contact with Patient 08/24/12 [phone]      Chief Complaint  Patient presents with  . Back Pain  . Leg Pain    (Consider location/radiation/quality/duration/timing/severity/associated sxs/prior treatment) HPI The patient presents with ongoing back and leg pain.  No clear precipitant prior to onset 2 weeks ago.  Since that time the pain has been largely present, though there are times when the pain is minimal.  The pain is worse with motion, sore, not improved with Tylenol.  The pain is diffuse across the back, though more prominent on the left, with radiation down the posterior of the left leg.  There is associated generalized sense of weakness, but no nausea, vomiting, fever, chills, diarrhea, dysuria, hematuria, chest pain, dyspnea.  The patient denies history of orthopedic revision in these areas, nor significant trauma in these areas. He has a notable history of kidney transplants 20 years ago.  Past Medical History  Diagnosis Date  . Hypertension   . Depression   . Urinary retention   . Diabetes mellitus   . Renal disorder     Past Surgical History  Procedure Laterality Date  . Kidney transplant    . Hernia repair    . Nephrectomy transplanted organ      Family History  Problem Relation Age of Onset  . Hypertension Sister     History  Substance Use Topics  . Smoking status: Former Games developer  . Smokeless tobacco: Never Used  . Alcohol Use: No     Comment: occasionally      Review of Systems  Constitutional:       Per HPI, otherwise negative  HENT:       Per HPI, otherwise negative  Respiratory:       Per HPI, otherwise negative  Cardiovascular:       Per HPI, otherwise negative  Gastrointestinal: Negative for vomiting.  Endocrine:       Negative aside from HPI  Genitourinary:       Neg aside from HPI   Musculoskeletal:       Per HPI, otherwise negative  Skin: Negative.    Neurological: Negative for syncope.    Allergies  Codeine and Vasotec  Home Medications   Current Outpatient Rx  Name  Route  Sig  Dispense  Refill  . amLODipine (NORVASC) 2.5 MG tablet   Oral   Take 2.5 mg by mouth every morning.          Marland Kitchen atorvastatin (LIPITOR) 10 MG tablet   Oral   Take 10 mg by mouth every morning.          . cycloSPORINE (SANDIMMUNE) 100 MG capsule   Oral   Take 125 mg by mouth 2 (two) times daily. Take with 25 mg tablet to equal 125 mg twice daily         . cycloSPORINE (SANDIMMUNE) 25 MG capsule   Oral   Take 125 mg by mouth 2 (two) times daily. Take with 100 mg tablet to equal dose of 125 mg twice daily         . esomeprazole (NEXIUM) 40 MG capsule   Oral   Take 40 mg by mouth 2 (two) times daily.          . predniSONE (DELTASONE) 5 MG tablet   Oral   Take 5 mg by mouth every morning.          Marland Kitchen  promethazine (PHENERGAN) 25 MG tablet   Oral   Take 1 tablet (25 mg total) by mouth every 6 (six) hours as needed for nausea.   20 tablet   0   . Tamsulosin HCl (FLOMAX) 0.4 MG CAPS   Oral   Take 0.4 mg by mouth every morning.          . traMADol (ULTRAM) 50 MG tablet   Oral   Take 1 tablet (50 mg total) by mouth every 6 (six) hours as needed for pain.   15 tablet   0   . ondansetron (ZOFRAN) 4 MG tablet   Oral   Take 1 tablet (4 mg total) by mouth every 6 (six) hours as needed for nausea.   12 tablet   0     BP 110/83  Pulse 81  Temp(Src) 98.4 F (36.9 C) (Oral)  Resp 20  Ht 5\' 11"  (1.803 m)  Wt 175 lb (79.379 kg)  BMI 24.42 kg/m2  SpO2 100%  Physical Exam  Nursing note and vitals reviewed. Constitutional: He is oriented to person, place, and time. He appears well-developed. No distress.  HENT:  Head: Normocephalic and atraumatic.  Eyes: Conjunctivae and EOM are normal.  Cardiovascular: Normal rate and regular rhythm.   Pulmonary/Chest: Effort normal. No stridor. No respiratory distress.  Abdominal: He  exhibits no distension.  Musculoskeletal: He exhibits no edema and no tenderness.  No discrete lesions, nor any focal deformities.  Strength and range of motion are appropriate in all extremities, though there is pain elicited in the back with hip flexion greater on the left than right.  Neurological: He is alert and oriented to person, place, and time.  Skin: Skin is warm and dry.  Psychiatric: He has a normal mood and affect.    ED Course  Procedures (including critical care time)  Labs Reviewed  BASIC METABOLIC PANEL  CBC WITH DIFFERENTIAL  URINALYSIS, ROUTINE W REFLEX MICROSCOPIC   No results found.   No diagnosis found.    MDM  This generally well-appearing male presents with ongoing back and leg pain.  Given the patient's history of renal transplant, his description of vague weakness labs and urine were sent.  These were reassuring, with values consistent for this patient.  Absent distress, with no fever, and with low suspicion for acute infectious pathology, or acute neurologic compromise, the patient was discharged in stable condition with orthopedics followup.  Encouraged to use ice packs analgesics, followup as directed.  Gerhard Munch, MD 08/24/12 [phone]

## 2012-08-24 NOTE — ED Notes (Signed)
Pt states he has been having back & leg pain the past 2 weeks. Pt states he has been taking tylenol w/ no relief.

## 2012-08-24 NOTE — ED Notes (Signed)
Pt alert & oriented x4, stable gait. Patient given discharge instructions, paperwork & prescription(s). Patient  instructed to stop at the registration desk to finish any additional paperwork. Patient verbalized understanding. Pt left department w/ no further questions. 

## 2012-09-06 DIAGNOSIS — I10 Essential (primary) hypertension: Secondary | ICD-10-CM | POA: Diagnosis not present

## 2012-09-19 ENCOUNTER — Encounter: Payer: Self-pay | Admitting: Internal Medicine

## 2012-09-29 ENCOUNTER — Emergency Department (HOSPITAL_COMMUNITY): Payer: Medicare Other

## 2012-09-29 ENCOUNTER — Encounter (HOSPITAL_COMMUNITY): Payer: Self-pay | Admitting: *Deleted

## 2012-09-29 ENCOUNTER — Emergency Department (HOSPITAL_COMMUNITY)
Admission: EM | Admit: 2012-09-29 | Discharge: 2012-09-29 | Disposition: A | Discharge status: Home / Self Care | Payer: Medicare Other | Attending: Emergency Medicine | Admitting: Emergency Medicine

## 2012-09-29 DIAGNOSIS — M5137 Other intervertebral disc degeneration, lumbosacral region: Secondary | ICD-10-CM | POA: Diagnosis not present

## 2012-09-29 DIAGNOSIS — G8929 Other chronic pain: Secondary | ICD-10-CM | POA: Diagnosis not present

## 2012-09-29 DIAGNOSIS — M25552 Pain in left hip: Secondary | ICD-10-CM

## 2012-09-29 DIAGNOSIS — I10 Essential (primary) hypertension: Secondary | ICD-10-CM | POA: Insufficient documentation

## 2012-09-29 DIAGNOSIS — Z79899 Other long term (current) drug therapy: Secondary | ICD-10-CM | POA: Diagnosis not present

## 2012-09-29 DIAGNOSIS — Z87891 Personal history of nicotine dependence: Secondary | ICD-10-CM | POA: Diagnosis not present

## 2012-09-29 DIAGNOSIS — E119 Type 2 diabetes mellitus without complications: Secondary | ICD-10-CM | POA: Diagnosis not present

## 2012-09-29 DIAGNOSIS — M25559 Pain in unspecified hip: Secondary | ICD-10-CM | POA: Diagnosis not present

## 2012-09-29 DIAGNOSIS — M12859 Other specific arthropathies, not elsewhere classified, unspecified hip: Secondary | ICD-10-CM | POA: Diagnosis not present

## 2012-09-29 DIAGNOSIS — Z87448 Personal history of other diseases of urinary system: Secondary | ICD-10-CM | POA: Insufficient documentation

## 2012-09-29 DIAGNOSIS — F329 Major depressive disorder, single episode, unspecified: Secondary | ICD-10-CM | POA: Diagnosis not present

## 2012-09-29 DIAGNOSIS — M169 Osteoarthritis of hip, unspecified: Secondary | ICD-10-CM | POA: Diagnosis not present

## 2012-09-29 DIAGNOSIS — F3289 Other specified depressive episodes: Secondary | ICD-10-CM | POA: Insufficient documentation

## 2012-09-29 HISTORY — DX: Dorsalgia, unspecified: M54.9

## 2012-09-29 HISTORY — DX: Other chronic pain: G89.29

## 2012-09-29 HISTORY — DX: Pain in unspecified hip: M25.559

## 2012-09-29 MED ORDER — OXYCODONE-ACETAMINOPHEN 5-325 MG PO TABS
2.0000 | ORAL_TABLET | Freq: Once | ORAL | Status: AC
  Administered 2012-09-29: 2 via ORAL
  Filled 2012-09-29: qty 2

## 2012-09-29 MED ORDER — HYDROCODONE-ACETAMINOPHEN 5-325 MG PO TABS: ORAL_TABLET | ORAL | Status: DC

## 2012-09-29 NOTE — ED Notes (Signed)
Pt reports pain in his left hip that started about 2 weeks ago, no known injury.  Reports that he is unable to take Motrin related to his kidney transplant.

## 2012-09-29 NOTE — ED Provider Notes (Signed)
History     CSN: [id number]  Arrival date & time 09/29/12  1203   First MD Initiated Contact with Patient 09/29/12 1332      Chief Complaint  Patient presents with  . Leg Pain     HPI Pt was seen at 1330. Per pt, c/o gradual onset and persistence of constant acute flair of his chronic left hip"pain" for the past several years, worse over the past several weeks.  States the pain worsened when he ran out of his pain meds. Denies any change in his usual chronic pain pattern.  Pain worsens with palpation of the area and body position changes. Denies incont/retention of bowel or bladder, no saddle anesthesia, no focal motor weakness, no tingling/numbness in extremities, no fevers, no injury, no abd pain.   The symptoms have been associated with no other complaints. The patient has a significant history of similar symptoms previously, recently being evaluated for this complaint and multiple prior evals for same.     Past Medical History  Diagnosis Date  . Hypertension   . Depression   . Urinary retention   . Diabetes mellitus   . Renal disorder   . Chronic back pain   . Chronic hip pain     Past Surgical History  Procedure Laterality Date  . Kidney transplant    . Hernia repair    . Nephrectomy transplanted organ      Family History  Problem Relation Age of Onset  . Hypertension Sister     History  Substance Use Topics  . Smoking status: Former Games developer  . Smokeless tobacco: Never Used  . Alcohol Use: No     Comment: occasionally      Review of Systems ROS: Statement: All systems negative except as marked or noted in the HPI; Constitutional: Negative for fever and chills. ; ; Eyes: Negative for eye pain, redness and discharge. ; ; ENMT: Negative for ear pain, hoarseness, nasal congestion, sinus pressure and sore throat. ; ; Cardiovascular: Negative for chest pain, palpitations, diaphoresis, dyspnea and peripheral edema. ; ; Respiratory: Negative for cough, wheezing and  stridor. ; ; Gastrointestinal: Negative for nausea, vomiting, diarrhea, abdominal pain, blood in stool, hematemesis, jaundice and rectal bleeding. . ; ; Genitourinary: Negative for dysuria, flank pain and hematuria. ; ; Musculoskeletal: +left hip pain. Negative for back pain and neck pain. Negative for swelling and trauma.; ; Skin: Negative for pruritus, rash, abrasions, blisters, bruising and skin lesion.; ; Neuro: Negative for headache, lightheadedness and neck stiffness. Negative for weakness, altered level of consciousness , altered mental status, extremity weakness, paresthesias, involuntary movement, seizure and syncope.       Allergies  Codeine and Vasotec  Home Medications   Current Outpatient Rx  Name  Route  Sig  Dispense  Refill  . amLODipine (NORVASC) 2.5 MG tablet   Oral   Take 2.5 mg by mouth every morning.          Marland Kitchen atorvastatin (LIPITOR) 10 MG tablet   Oral   Take 10 mg by mouth every morning.          . cycloSPORINE (SANDIMMUNE) 100 MG capsule   Oral   Take 125 mg by mouth 2 (two) times daily. Take with 25 mg tablet to equal 125 mg twice daily         . cycloSPORINE (SANDIMMUNE) 25 MG capsule   Oral   Take 125 mg by mouth 2 (two) times daily. Take with 100 mg tablet to  equal dose of 125 mg twice daily         . esomeprazole (NEXIUM) 40 MG capsule   Oral   Take 40 mg by mouth 2 (two) times daily.          . predniSONE (DELTASONE) 5 MG tablet   Oral   Take 5 mg by mouth every morning.          . Tamsulosin HCl (FLOMAX) 0.4 MG CAPS   Oral   Take 0.4 mg by mouth every morning.          Marland Kitchen HYDROcodone-acetaminophen (NORCO/VICODIN) 5-325 MG per tablet      1 or 2 tabs PO q6 hours prn pain   25 tablet   0     BP 107/67  Pulse 72  Temp(Src) 97.4 F (36.3 C) (Oral)  Resp 20  Ht 5\' 11"  (1.803 m)  Wt 180 lb (81.647 kg)  BMI 25.12 kg/m2  SpO2 100%  Physical Exam 1335: Physical examination:  Nursing notes reviewed; Vital signs and O2 SAT  reviewed;  Constitutional: Well developed, Well nourished, Well hydrated, In no acute distress; Head:  Normocephalic, atraumatic; Eyes: EOMI, PERRL, No scleral icterus; ENMT: Mouth and pharynx normal, Mucous membranes moist; Neck: Supple, Full range of motion, No lymphadenopathy; Cardiovascular: Regular rate and rhythm, No murmur, rub, or gallop; Respiratory: Breath sounds clear & equal bilaterally, No rales, rhonchi, wheezes.  Speaking full sentences with ease, Normal respiratory effort/excursion; Chest: Nontender, Movement normal; Abdomen: Soft, Nontender, Nondistended, Normal bowel sounds; Genitourinary: No CVA tenderness; Spine:  No midline CS, TS, LS tenderness.;; Extremities: Pulses normal, Pelvis stable. +TTP left hip. LLE muscles compartments soft. No deformity, no edema, no erythema, no ecchymosis, no rash.  No calf edema or asymmetry.; Neuro: AA&Ox3, Major CN grossly intact.  Speech clear. Strength 5/5 equal bilat UE's and LE's, including great toe dorsiflexion.  DTR 2/4 equal bilat UE's and LE's.  No gross sensory deficits.  Neg straight leg raises bilat. Climbs on and off stretcher easily by himself. Gait steady.; Skin: Color normal, Warm, Dry.   ED Course  Procedures     MDM  MDM Reviewed: previous chart, nursing note and vitals Reviewed previous: MRI Interpretation: x-ray   Dg Lumbar Spine Complete 09/29/2012  *RADIOLOGY REPORT*  Clinical Data: Left hip pain  LUMBAR SPINE - COMPLETE 4+ VIEW  Comparison: 10/18/2011  Findings: The alignment of the lumbar spine is normal.  The vertebral body heights are well preserved.  There is disc space narrowing and ventral endplate spurring at L4-5 consistent with degenerative disc disease.  No acute abnormalities identified.  IMPRESSION:  1.  Lumbar degenerative disc disease.   Original Report Authenticated By: Signa Kell, M.D.    Dg Hip Complete Left 09/29/2012  *RADIOLOGY REPORT*  Clinical Data: Left hip pain for 2 weeks  LEFT HIP - COMPLETE 2+  VIEW  Comparison: 10/17/2011  Findings: Three views of the left hip submitted.  No acute fracture or subluxation.  Again noted degenerative changes with narrowing of superior joint space.  Mild superior acetabular spurring.  IMPRESSION: No acute fracture or subluxation.  Degenerative changes as described above.   Original Report Authenticated By: Natasha Mead, M.D.       1520:  Long hx of chronic pain with multiple ED visits for same.  Pt endorses acute flair of his usual long standing chronic pain today, no change from his usual chronic pain pattern. States he "just comes to the ED to get pain meds" because  his usual doctor "doesn't believe in giving out pain meds." Pt encouraged to f/u with his PMD and Pain Management doctor for good continuity of care and control of his chronic pain.  Verb understanding.        Laray Anger, DO 10/02/12 1321

## 2012-09-29 NOTE — ED Notes (Signed)
Lt inguinal pain and down lt thigh for 2 weeks, No known injury

## 2012-10-11 DIAGNOSIS — N400 Enlarged prostate without lower urinary tract symptoms: Secondary | ICD-10-CM | POA: Diagnosis not present

## 2012-10-24 DIAGNOSIS — Z48298 Encounter for aftercare following other organ transplant: Secondary | ICD-10-CM | POA: Diagnosis not present

## 2012-10-24 DIAGNOSIS — M25859 Other specified joint disorders, unspecified hip: Secondary | ICD-10-CM | POA: Diagnosis not present

## 2012-10-24 DIAGNOSIS — M169 Osteoarthritis of hip, unspecified: Secondary | ICD-10-CM | POA: Diagnosis not present

## 2012-10-24 DIAGNOSIS — N186 End stage renal disease: Secondary | ICD-10-CM | POA: Diagnosis not present

## 2012-10-24 DIAGNOSIS — Z9225 Personal history of immunosupression therapy: Secondary | ICD-10-CM | POA: Diagnosis not present

## 2012-10-24 DIAGNOSIS — M948X9 Other specified disorders of cartilage, unspecified sites: Secondary | ICD-10-CM | POA: Diagnosis not present

## 2012-10-24 DIAGNOSIS — D631 Anemia in chronic kidney disease: Secondary | ICD-10-CM | POA: Diagnosis not present

## 2012-10-24 DIAGNOSIS — I120 Hypertensive chronic kidney disease with stage 5 chronic kidney disease or end stage renal disease: Secondary | ICD-10-CM | POA: Diagnosis not present

## 2012-10-24 DIAGNOSIS — R82998 Other abnormal findings in urine: Secondary | ICD-10-CM | POA: Diagnosis not present

## 2012-10-24 DIAGNOSIS — Z79899 Other long term (current) drug therapy: Secondary | ICD-10-CM | POA: Diagnosis not present

## 2012-10-24 DIAGNOSIS — Z940 Kidney transplant status: Secondary | ICD-10-CM | POA: Diagnosis not present

## 2012-10-24 DIAGNOSIS — IMO0002 Reserved for concepts with insufficient information to code with codable children: Secondary | ICD-10-CM | POA: Diagnosis not present

## 2012-11-16 ENCOUNTER — Emergency Department (HOSPITAL_COMMUNITY): Payer: Medicare Other

## 2012-11-16 ENCOUNTER — Encounter (HOSPITAL_COMMUNITY): Payer: Self-pay | Admitting: Emergency Medicine

## 2012-11-16 ENCOUNTER — Emergency Department (HOSPITAL_COMMUNITY)
Admission: EM | Admit: 2012-11-16 | Discharge: 2012-11-16 | Disposition: A | Discharge status: Home / Self Care | Payer: Medicare Other | Attending: Emergency Medicine | Admitting: Emergency Medicine

## 2012-11-16 DIAGNOSIS — E119 Type 2 diabetes mellitus without complications: Secondary | ICD-10-CM | POA: Diagnosis not present

## 2012-11-16 DIAGNOSIS — Z79899 Other long term (current) drug therapy: Secondary | ICD-10-CM | POA: Diagnosis not present

## 2012-11-16 DIAGNOSIS — I10 Essential (primary) hypertension: Secondary | ICD-10-CM | POA: Insufficient documentation

## 2012-11-16 DIAGNOSIS — Z87891 Personal history of nicotine dependence: Secondary | ICD-10-CM | POA: Insufficient documentation

## 2012-11-16 DIAGNOSIS — R110 Nausea: Secondary | ICD-10-CM | POA: Diagnosis not present

## 2012-11-16 DIAGNOSIS — N059 Unspecified nephritic syndrome with unspecified morphologic changes: Secondary | ICD-10-CM | POA: Insufficient documentation

## 2012-11-16 DIAGNOSIS — G8929 Other chronic pain: Secondary | ICD-10-CM | POA: Insufficient documentation

## 2012-11-16 DIAGNOSIS — Z87448 Personal history of other diseases of urinary system: Secondary | ICD-10-CM | POA: Diagnosis not present

## 2012-11-16 DIAGNOSIS — IMO0002 Reserved for concepts with insufficient information to code with codable children: Secondary | ICD-10-CM | POA: Insufficient documentation

## 2012-11-16 DIAGNOSIS — Z8659 Personal history of other mental and behavioral disorders: Secondary | ICD-10-CM | POA: Diagnosis not present

## 2012-11-16 DIAGNOSIS — K5732 Diverticulitis of large intestine without perforation or abscess without bleeding: Secondary | ICD-10-CM

## 2012-11-16 DIAGNOSIS — R109 Unspecified abdominal pain: Secondary | ICD-10-CM | POA: Diagnosis not present

## 2012-11-16 LAB — URINALYSIS, ROUTINE W REFLEX MICROSCOPIC
Hgb urine dipstick: NEGATIVE
Leukocytes, UA: NEGATIVE
Nitrite: NEGATIVE
Specific Gravity, Urine: 1.015 (ref 1.005–1.030)
Urobilinogen, UA: 1 mg/dL (ref 0.0–1.0)

## 2012-11-16 LAB — CBC WITH DIFFERENTIAL/PLATELET
Eosinophils Absolute: 0.2 10*3/uL (ref 0.0–0.7)
Lymphocytes Relative: 24 % (ref 12–46)
Lymphs Abs: 2.5 10*3/uL (ref 0.7–4.0)
MCH: 22.7 pg — ABNORMAL LOW (ref 26.0–34.0)
Neutrophils Relative %: 64 % (ref 43–77)
Platelets: 129 10*3/uL — ABNORMAL LOW (ref 150–400)
RBC: 6 MIL/uL — ABNORMAL HIGH (ref 4.22–5.81)
WBC: 10.2 10*3/uL (ref 4.0–10.5)

## 2012-11-16 LAB — BASIC METABOLIC PANEL
GFR calc non Af Amer: 54 mL/min — ABNORMAL LOW (ref >90)
Glucose, Bld: 92 mg/dL (ref 70–99)
Potassium: 3.9 mEq/L (ref 3.5–5.1)
Sodium: 136 mEq/L (ref 135–145)

## 2012-11-16 MED ORDER — METRONIDAZOLE IN NACL 5-0.79 MG/ML-% IV SOLN
500.0000 mg | Freq: Once | INTRAVENOUS | Status: AC
  Administered 2012-11-16: 500 mg via INTRAVENOUS
  Filled 2012-11-16: qty 100

## 2012-11-16 MED ORDER — MORPHINE SULFATE 4 MG/ML IJ SOLN
4.0000 mg | Freq: Once | INTRAMUSCULAR | Status: AC
  Administered 2012-11-16: 4 mg via INTRAVENOUS
  Filled 2012-11-16: qty 1

## 2012-11-16 MED ORDER — CIPROFLOXACIN IN D5W 400 MG/200ML IV SOLN
400.0000 mg | Freq: Once | INTRAVENOUS | Status: AC
  Administered 2012-11-16: 400 mg via INTRAVENOUS
  Filled 2012-11-16: qty 200

## 2012-11-16 MED ORDER — METRONIDAZOLE 500 MG PO TABS: 500.0000 mg | ORAL_TABLET | Freq: Four times a day (QID) | ORAL | Status: DC

## 2012-11-16 MED ORDER — SODIUM CHLORIDE 0.9 % IV SOLN: 1000.0000 mL | INTRAVENOUS | Status: DC

## 2012-11-16 MED ORDER — HYDROCODONE-ACETAMINOPHEN 5-325 MG PO TABS: ORAL_TABLET | ORAL | Status: DC

## 2012-11-16 MED ORDER — ONDANSETRON HCL 4 MG PO TABS: 4.0000 mg | ORAL_TABLET | Freq: Four times a day (QID) | ORAL | Status: DC | PRN

## 2012-11-16 MED ORDER — ONDANSETRON HCL 4 MG/2ML IJ SOLN
4.0000 mg | Freq: Once | INTRAMUSCULAR | Status: AC
  Administered 2012-11-16: 4 mg via INTRAVENOUS
  Filled 2012-11-16: qty 2

## 2012-11-16 MED ORDER — SODIUM CHLORIDE 0.9 % IV SOLN
1000.0000 mL | Freq: Once | INTRAVENOUS | Status: AC
  Administered 2012-11-16: 1000 mL via INTRAVENOUS

## 2012-11-16 MED ORDER — CIPROFLOXACIN HCL 500 MG PO TABS: 500.0000 mg | ORAL_TABLET | Freq: Two times a day (BID) | ORAL | Status: DC

## 2012-11-16 MED ORDER — IOHEXOL 300 MG/ML  SOLN
50.0000 mL | Freq: Once | INTRAMUSCULAR | Status: AC | PRN
  Administered 2012-11-16: 50 mL via ORAL

## 2012-11-16 NOTE — ED Notes (Signed)
Patient states he does not need anything at this time. 

## 2012-11-16 NOTE — ED Notes (Signed)
EDP in with pt for eval.

## 2012-11-16 NOTE — ED Notes (Signed)
abd pain for a couple days. Nausea but no vomiting. Last BM was this morning

## 2012-11-16 NOTE — ED Provider Notes (Signed)
History  This chart was scribed for Ward Givens, MD by Bennett Scrape, ED Scribe. This patient was seen in room APA06/APA06 and the patient's care was started at 12:07 PM.  CSN: [id number]  Arrival date & time 11/16/12  1017   First MD Initiated Contact with Patient 11/16/12 1207      Chief Complaint  Patient presents with  . Abdominal Pain  . Nausea     Patient is a 57 y.o. male presenting with abdominal pain. The history is provided by the patient. No language interpreter was used.  Abdominal Pain This is a new problem. The current episode started 2 days ago. The problem occurs constantly. The problem has been gradually worsening. Associated symptoms include abdominal pain. He has tried nothing for the symptoms.    HPI Comments: Robert Durham is a 57 y.o. male with a h/o right kidney transplant in 1979 who presents to the Emergency Department complaining of "a few" days of periumbilical abdominal pain described as sharp. The pain was originally intermittent but became constant since last night. The pain is aggravated by eating, standing up and laying flat. He denies having any alleviating factors. Pt denies having prior episodes of similar symptoms.He denies fever, emesis, dysuria, hematuria, frequency and diarrhea as associated symptoms.He does have nausea. He also describes hot spells.  He has a h/o HTN and DM and denies smoking and alcohol use.  Pt is followed by Dr. Eligha Bridegroom, nephrology, last appointment was months ago. He has an appointment at [facility] tomorrow for "my legs". Pt is extremely vague.  Past Medical History  Diagnosis Date  . Hypertension   . Depression   . Urinary retention   . Diabetes mellitus   . Renal disorder   . Chronic back pain   . Chronic hip pain     Past Surgical History  Procedure Laterality Date  . Kidney transplant    . Hernia repair    . Nephrectomy transplanted organ      Family History  Problem Relation Age of Onset  .  Hypertension Sister     History  Substance Use Topics  . Smoking status: Former Games developer  . Smokeless tobacco: Never Used  . Alcohol Use: No     Comment: occasionally   Pt lives with a friend and is on disability since kidney transplant in 1979 (followed by Dr. Eligha Bridegroom)     Review of Systems  Constitutional: Negative for fever and chills.  Gastrointestinal: Positive for nausea and abdominal pain. Negative for vomiting and diarrhea.  Genitourinary: Negative for dysuria and hematuria.  All other systems reviewed and are negative.    Allergies  Codeine and Vasotec  Home Medications   Current Outpatient Rx  Name  Route  Sig  Dispense  Refill  . amLODipine (NORVASC) 2.5 MG tablet   Oral   Take 2.5 mg by mouth every morning.          Marland Kitchen atorvastatin (LIPITOR) 10 MG tablet   Oral   Take 10 mg by mouth every morning.          . cycloSPORINE (SANDIMMUNE) 100 MG capsule   Oral   Take 125 mg by mouth 2 (two) times daily. Take with 25 mg tablet to equal 125 mg twice daily         . cycloSPORINE (SANDIMMUNE) 25 MG capsule   Oral   Take 125 mg by mouth 2 (two) times daily. Take with 100 mg tablet to equal dose  of 125 mg twice daily         . esomeprazole (NEXIUM) 40 MG capsule   Oral   Take 40 mg by mouth 2 (two) times daily.          . predniSONE (DELTASONE) 5 MG tablet   Oral   Take 5 mg by mouth every morning.          . Tamsulosin HCl (FLOMAX) 0.4 MG CAPS   Oral   Take 0.4 mg by mouth every morning.            Triage Vitals: BP 105/88  Pulse 78  Temp(Src) 98.4 F (36.9 C) (Oral)  Resp 22  Ht 5\' 11"  (1.803 m)  Wt 180 lb (81.647 kg)  BMI 25.12 kg/m2  SpO2 100%  Vital signs normal    Physical Exam  Nursing note and vitals reviewed. Constitutional: He is oriented to person, place, and time. He appears well-developed and well-nourished.  Non-toxic appearance. He does not appear ill. No distress.  HENT:  Head: Normocephalic and atraumatic.  Right  Ear: External ear normal.  Left Ear: External ear normal.  Nose: Nose normal. No mucosal edema or rhinorrhea.  Mouth/Throat: Oropharynx is clear and moist and mucous membranes are normal. No dental abscesses or edematous.  Eyes: Conjunctivae and EOM are normal. Pupils are equal, round, and reactive to light.  Neck: Normal range of motion and full passive range of motion without pain. Neck supple.  Cardiovascular: Normal rate, regular rhythm and normal heart sounds.  Exam reveals no gallop and no friction rub.   No murmur heard. Pulmonary/Chest: Effort normal and breath sounds normal. No respiratory distress. He has no wheezes. He has no rhonchi. He has no rales. He exhibits no tenderness and no crepitus.  Abdominal: Soft. Normal appearance and bowel sounds are normal. He exhibits no distension. There is no tenderness. There is no rebound and no guarding.    No CVA tenderness, tender in the suprapubic, LLQ near his scar for his renal transplant  Musculoskeletal: Normal range of motion. He exhibits no edema and no tenderness.  Moves all extremities well.   Neurological: He is alert and oriented to person, place, and time. He has normal strength. No cranial nerve deficit.  Skin: Skin is warm, dry and intact. No rash noted. No erythema. No pallor.  Psychiatric: He has a normal mood and affect. His speech is normal and behavior is normal. His mood appears not anxious.    ED Course  Procedures (including critical care time)  Medications  0.9 %  sodium chloride infusion (0 mLs Intravenous Stopped 11/16/12 1358)    Followed by  0.9 %  sodium chloride infusion (not administered)  ciprofloxacin (CIPRO) IVPB 400 mg (400 mg Intravenous New Bag/Given 11/16/12 1611)  metroNIDAZOLE (FLAGYL) IVPB 500 mg (not administered)  morphine 4 MG/ML injection 4 mg (4 mg Intravenous Given 11/16/12 1258)  ondansetron (ZOFRAN) injection 4 mg (4 mg Intravenous Given 11/16/12 1258)  iohexol (OMNIPAQUE) 300 MG/ML solution 50  mL (50 mLs Oral Contrast Given 11/16/12 1243)    DIAGNOSTIC STUDIES: Oxygen Saturation is 100% on room air, normal by my interpretation.    COORDINATION OF CARE: 12:20 PM-Informed pt of results back thus far. Discussed treatment plan which includes CT of abdomen with pt at bedside and pt agreed to plan.   AP CT scan done with oral contrast, but not IV contrast b/o his transplant kidney.   1540 p.m. patient given results of his CT scan.  He states he feels like he can to home with oral medications, he states he will return if he feels worse. He relates his pain is improved.  Results for orders placed during the hospital encounter of 11/16/12  CBC WITH DIFFERENTIAL      Result Value Range   WBC 10.2  4.0 - 10.5 K/uL   RBC 6.00 (*) 4.22 - 5.81 MIL/uL   Hemoglobin 13.6  13.0 - 17.0 g/dL   HCT 57.8  46.9 - 62.9 %   MCV 69.2 (*) 78.0 - 100.0 fL   MCH 22.7 (*) 26.0 - 34.0 pg   MCHC 32.8  30.0 - 36.0 g/dL   RDW 52.8  41.3 - 24.4 %   Platelets 129 (*) 150 - 400 K/uL   Neutrophils Relative % 64  43 - 77 %   Neutro Abs 6.6  1.7 - 7.7 K/uL   Lymphocytes Relative 24  12 - 46 %   Lymphs Abs 2.5  0.7 - 4.0 K/uL   Monocytes Relative 9  3 - 12 %   Monocytes Absolute 1.0  0.1 - 1.0 K/uL   Eosinophils Relative 2  0 - 5 %   Eosinophils Absolute 0.2  0.0 - 0.7 K/uL   Basophils Relative 0  0 - 1 %   Basophils Absolute 0.0  0.0 - 0.1 K/uL  BASIC METABOLIC PANEL      Result Value Range   Sodium 136  135 - 145 mEq/L   Potassium 3.9  3.5 - 5.1 mEq/L   Chloride 99  96 - 112 mEq/L   CO2 27  19 - 32 mEq/L   Glucose, Bld 92  70 - 99 mg/dL   BUN 17  6 - 23 mg/dL   Creatinine, Ser 0.10 (*) 0.50 - 1.35 mg/dL   Calcium 9.5  8.4 - 27.2 mg/dL   GFR calc non Af Amer 54 (*) >90 mL/min   GFR calc Af Amer 62 (*) >90 mL/min  URINALYSIS, ROUTINE W REFLEX MICROSCOPIC      Result Value Range   Color, Urine YELLOW  YELLOW   APPearance CLEAR  CLEAR   Specific Gravity, Urine 1.015  1.005 - 1.030   pH 7.0  5.0 -  8.0   Glucose, UA NEGATIVE  NEGATIVE mg/dL   Hgb urine dipstick NEGATIVE  NEGATIVE   Bilirubin Urine NEGATIVE  NEGATIVE   Ketones, ur NEGATIVE  NEGATIVE mg/dL   Protein, ur NEGATIVE  NEGATIVE mg/dL   Urobilinogen, UA 1.0  0.0 - 1.0 mg/dL   Nitrite NEGATIVE  NEGATIVE   Leukocytes, UA NEGATIVE  NEGATIVE   Laboratory interpretation all normal except renal insufficiency which is stable    Ct Abdomen Pelvis Wo Contrast  11/16/2012   *RADIOLOGY REPORT*  Clinical Data: Diverticulitis.  Left lower quadrant pain.  Nausea. History of renal transplant.  CT ABDOMEN AND PELVIS WITHOUT CONTRAST  Technique:  Multidetector CT imaging of the abdomen and pelvis was performed following the standard protocol without intravenous contrast.  Comparison: 08/23/2011.  Findings: Lung Bases: Dependent atelectasis.  Liver:  Unenhanced CT was performed per clinician order.  Lack of IV contrast limits sensitivity and specificity, especially for evaluation of abdominal/pelvic solid viscera.  Grossly normal.  Spleen:  Normal.  Gallbladder:  Distended, probably due to fasting state.  No inflammatory changes.  Common bile duct:  Normal.  Pancreas:  Normal.  Adrenal glands:  Normal.  Kidneys:  Atrophic bilaterally, without renal cysts, which is somewhat unusual in this degree  of renal atrophy.  The ureters appear within normal limits.  Left iliac fossa renal allograft appears within normal limits.  The allograft ureter appears normal.  Stomach:  Normal.  Small bowel:  Normal.  No mesenteric adenopathy.  Colon:   Normal appendix.  Ascending colon appears normal. Transverse colon is normal.  Descending and sigmoid diverticulosis with uncomplicated acute sigmoid diverticulitis in the midsigmoid, which lies within the periumbilical region.  There is no abscess. No intra-abdominal free air.  Pelvic Genitourinary:  Urinary bladder is within normal limits.  No free fluid.  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  No destructive osseous lesions.  Vasculature: Mild atherosclerosis.  No acute abnormality accounting for noncontrast technique.  Body Wall: Fat containing right inguinal hernia.  Postoperative changes in the lower anterior abdominal wall.  IMPRESSION:  1.  Uncomplicated acute sigmoid diverticulitis. 2.  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 3.  Fat containing right inguinal hernia.   Original Report Authenticated By: Andreas Newport, M.D.    1. Sigmoid diverticulitis      New Prescriptions   CIPROFLOXACIN (CIPRO) 500 MG TABLET    Take 1 tablet (500 mg total) by mouth every 12 (twelve) hours.   HYDROCODONE-ACETAMINOPHEN (NORCO/VICODIN) 5-325 MG PER TABLET    Take 1 or 2 po Q 6hrs for pain   METRONIDAZOLE (FLAGYL) 500 MG TABLET    Take 1 tablet (500 mg total) by mouth QID.   ONDANSETRON (ZOFRAN) 4 MG TABLET    Take 1 tablet (4 mg total) by mouth every 6 (six) hours as needed for nausea.    Plan discharge   Devoria Albe, MD, FACEP    MDM    I personally performed the services described in this documentation, which was scribed in my presence. The recorded information has been reviewed and considered.  Devoria Albe, MD, Armando Gang    Ward Givens, MD 11/16/12 [phone]

## 2012-11-17 DIAGNOSIS — Z09 Encounter for follow-up examination after completed treatment for conditions other than malignant neoplasm: Secondary | ICD-10-CM | POA: Diagnosis not present

## 2012-11-17 DIAGNOSIS — Z940 Kidney transplant status: Secondary | ICD-10-CM | POA: Diagnosis not present

## 2012-12-07 DIAGNOSIS — D649 Anemia, unspecified: Secondary | ICD-10-CM | POA: Diagnosis not present

## 2012-12-07 DIAGNOSIS — I10 Essential (primary) hypertension: Secondary | ICD-10-CM | POA: Diagnosis not present

## 2012-12-24 ENCOUNTER — Encounter (HOSPITAL_COMMUNITY): Payer: Self-pay | Admitting: *Deleted

## 2012-12-24 ENCOUNTER — Emergency Department (HOSPITAL_COMMUNITY)
Admission: EM | Admit: 2012-12-24 | Discharge: 2012-12-24 | Disposition: A | Discharge status: Home / Self Care | Payer: Medicare Other | Attending: Emergency Medicine | Admitting: Emergency Medicine

## 2012-12-24 DIAGNOSIS — R110 Nausea: Secondary | ICD-10-CM | POA: Diagnosis not present

## 2012-12-24 DIAGNOSIS — Z87891 Personal history of nicotine dependence: Secondary | ICD-10-CM | POA: Insufficient documentation

## 2012-12-24 DIAGNOSIS — F329 Major depressive disorder, single episode, unspecified: Secondary | ICD-10-CM | POA: Insufficient documentation

## 2012-12-24 DIAGNOSIS — G8929 Other chronic pain: Secondary | ICD-10-CM | POA: Insufficient documentation

## 2012-12-24 DIAGNOSIS — M545 Low back pain, unspecified: Secondary | ICD-10-CM | POA: Insufficient documentation

## 2012-12-24 DIAGNOSIS — R5381 Other malaise: Secondary | ICD-10-CM | POA: Diagnosis not present

## 2012-12-24 DIAGNOSIS — I10 Essential (primary) hypertension: Secondary | ICD-10-CM | POA: Insufficient documentation

## 2012-12-24 DIAGNOSIS — E119 Type 2 diabetes mellitus without complications: Secondary | ICD-10-CM | POA: Insufficient documentation

## 2012-12-24 DIAGNOSIS — M25559 Pain in unspecified hip: Secondary | ICD-10-CM | POA: Diagnosis not present

## 2012-12-24 DIAGNOSIS — R209 Unspecified disturbances of skin sensation: Secondary | ICD-10-CM | POA: Diagnosis not present

## 2012-12-24 DIAGNOSIS — Z87448 Personal history of other diseases of urinary system: Secondary | ICD-10-CM | POA: Diagnosis not present

## 2012-12-24 DIAGNOSIS — Z79899 Other long term (current) drug therapy: Secondary | ICD-10-CM | POA: Diagnosis not present

## 2012-12-24 DIAGNOSIS — F3289 Other specified depressive episodes: Secondary | ICD-10-CM | POA: Insufficient documentation

## 2012-12-24 NOTE — ED Provider Notes (Signed)
History    This chart was scribed for Joya Gaskins, MD by Leone Payor, ED Scribe. This patient was seen in room APA06/APA06 and the patient's care was started 9:34 AM.  CSN: [id number] Arrival date & time 12/24/12  0915  First MD Initiated Contact with Patient 12/24/12 [phone]     Chief Complaint  Patient presents with  . Hip Pain    Patient is a 57 y.o. male presenting with hip pain. The history is provided by the patient. No language interpreter was used.  Hip Pain This is a recurrent problem. The problem occurs constantly. The problem has not changed since onset.Pertinent negatives include no abdominal pain. Nothing aggravates the symptoms. Nothing relieves the symptoms. He has tried nothing for the symptoms.    HPI Comments: Robert Durham is a 57 y.o. male who presents to the Emergency Department complaining of 3 days of ongoing, unchanged, constant L hip pain that radiates into the leg. Pt states he has been seen for this pain before and he was prescribed pain medication and was told to follow up with PCP. He denies any new injury or trauma to the area. Pt has associated occasional back pain and some weakness/numbness in left leg. Pt is able to ambulate. He denies fever, vomiting, abdominal pain, change in bowel or bladder function.   Past Medical History  Diagnosis Date  . Hypertension   . Depression   . Urinary retention   . Diabetes mellitus   . Renal disorder   . Chronic back pain   . Chronic hip pain    Past Surgical History  Procedure Laterality Date  . Kidney transplant    . Hernia repair    . Nephrectomy transplanted organ     Family History  Problem Relation Age of Onset  . Hypertension Sister    History  Substance Use Topics  . Smoking status: Former Games developer  . Smokeless tobacco: Never Used  . Alcohol Use: No     Comment: occasionally    Review of Systems  Constitutional: Negative for fever.  Gastrointestinal: Positive for nausea. Negative for  vomiting and abdominal pain.  Musculoskeletal: Positive for back pain and arthralgias (left hip pain). Negative for gait problem.  All other systems reviewed and are negative.    Allergies  Codeine and Vasotec  Home Medications   Current Outpatient Rx  Name  Route  Sig  Dispense  Refill  . amLODipine (NORVASC) 2.5 MG tablet   Oral   Take 2.5 mg by mouth every morning.          Marland Kitchen atorvastatin (LIPITOR) 10 MG tablet   Oral   Take 10 mg by mouth every morning.          . ciprofloxacin (CIPRO) 500 MG tablet   Oral   Take 1 tablet (500 mg total) by mouth every 12 (twelve) hours.   20 tablet   0   . cycloSPORINE (SANDIMMUNE) 100 MG capsule   Oral   Take 125 mg by mouth 2 (two) times daily. Take with 25 mg tablet to equal 125 mg twice daily         . cycloSPORINE (SANDIMMUNE) 25 MG capsule   Oral   Take 125 mg by mouth 2 (two) times daily. Take with 100 mg tablet to equal dose of 125 mg twice daily         . esomeprazole (NEXIUM) 40 MG capsule   Oral   Take 40 mg by mouth  2 (two) times daily.          Marland Kitchen HYDROcodone-acetaminophen (NORCO/VICODIN) 5-325 MG per tablet      Take 1 or 2 po Q 6hrs for pain   25 tablet   0   . metroNIDAZOLE (FLAGYL) 500 MG tablet   Oral   Take 1 tablet (500 mg total) by mouth QID.   40 tablet   0   . ondansetron (ZOFRAN) 4 MG tablet   Oral   Take 1 tablet (4 mg total) by mouth every 6 (six) hours as needed for nausea.   4 tablet   0   . predniSONE (DELTASONE) 5 MG tablet   Oral   Take 5 mg by mouth every morning.          . Tamsulosin HCl (FLOMAX) 0.4 MG CAPS   Oral   Take 0.4 mg by mouth every morning.           BP 99/69  Pulse 55  Temp(Src) 98.1 F (36.7 C) (Oral)  Resp 20  Ht 5\' 11"  (1.803 m)  Wt 179 lb (81.194 kg)  BMI 24.98 kg/m2  SpO2 100%  Physical Exam CONSTITUTIONAL: Well developed/well nourished HEAD: Normocephalic/atraumatic EYES: EOMI/PERRL ENMT: Mucous membranes moist NECK: supple no  meningeal signs SPINE:entire spine nontender CV: S1/S2 noted, no murmurs/rubs/gallops noted LUNGS: Lungs are clear to auscultation bilaterally, no apparent distress ABDOMEN: soft, nontender, no rebound or guarding GU:no cva tenderness. No inguinal tenderness/mass, chaperone present NEURO: Pt is awake/alert, moves all extremitiesx4 EXTREMITIES: pulses normal, full ROM. No warmth or erythema over L hip. No deformity. Pt is able to ambulate.  SKIN: warm, color normal PSYCH: no abnormalities of mood noted  ED Course  Procedures (including critical care time)  DIAGNOSTIC STUDIES: Oxygen Saturation is 100% on RA, normal by my interpretation.    COORDINATION OF CARE: 9:40 AM Discussed treatment plan with pt at bedside and pt agreed to plan.   Pt ambulatory, no distress, he has been seen for this previously with negative imaging, do not feel further imaging needed, pt advised to f/u with ortho as this is appears to be chronic.  MDM  Nursing notes including past medical history and social history reviewed and considered in documentation Previous records reviewed and considered Narcotic database reviewed  I personally performed the services described in this documentation, which was scribed in my presence. The recorded information has been reviewed and is accurate.      Joya Gaskins, MD 12/24/12 1003

## 2012-12-24 NOTE — ED Notes (Signed)
Pt c/o left hip pain that radiates down left leg, started 3 days ago, has hx of problems with left hip unsure of any diagnosis, denies any injury, cms intact distal

## 2013-01-08 DIAGNOSIS — Z1212 Encounter for screening for malignant neoplasm of rectum: Secondary | ICD-10-CM | POA: Diagnosis not present

## 2013-01-08 DIAGNOSIS — I10 Essential (primary) hypertension: Secondary | ICD-10-CM | POA: Diagnosis not present

## 2013-01-08 DIAGNOSIS — Z125 Encounter for screening for malignant neoplasm of prostate: Secondary | ICD-10-CM | POA: Diagnosis not present

## 2013-01-24 ENCOUNTER — Encounter (HOSPITAL_COMMUNITY): Payer: Self-pay

## 2013-01-24 ENCOUNTER — Emergency Department (HOSPITAL_COMMUNITY)
Admission: EM | Admit: 2013-01-24 | Discharge: 2013-01-24 | Disposition: A | Discharge status: Home / Self Care | Payer: Medicare Other | Attending: Emergency Medicine | Admitting: Emergency Medicine

## 2013-01-24 ENCOUNTER — Emergency Department (HOSPITAL_COMMUNITY): Payer: Medicare Other

## 2013-01-24 DIAGNOSIS — I10 Essential (primary) hypertension: Secondary | ICD-10-CM | POA: Insufficient documentation

## 2013-01-24 DIAGNOSIS — G8929 Other chronic pain: Secondary | ICD-10-CM | POA: Insufficient documentation

## 2013-01-24 DIAGNOSIS — Z79899 Other long term (current) drug therapy: Secondary | ICD-10-CM | POA: Insufficient documentation

## 2013-01-24 DIAGNOSIS — I700 Atherosclerosis of aorta: Secondary | ICD-10-CM | POA: Diagnosis not present

## 2013-01-24 DIAGNOSIS — Z9889 Other specified postprocedural states: Secondary | ICD-10-CM | POA: Insufficient documentation

## 2013-01-24 DIAGNOSIS — R112 Nausea with vomiting, unspecified: Secondary | ICD-10-CM | POA: Diagnosis not present

## 2013-01-24 DIAGNOSIS — R1084 Generalized abdominal pain: Secondary | ICD-10-CM | POA: Diagnosis not present

## 2013-01-24 DIAGNOSIS — K5289 Other specified noninfective gastroenteritis and colitis: Secondary | ICD-10-CM | POA: Diagnosis not present

## 2013-01-24 DIAGNOSIS — F3289 Other specified depressive episodes: Secondary | ICD-10-CM | POA: Insufficient documentation

## 2013-01-24 DIAGNOSIS — F329 Major depressive disorder, single episode, unspecified: Secondary | ICD-10-CM | POA: Insufficient documentation

## 2013-01-24 DIAGNOSIS — R0602 Shortness of breath: Secondary | ICD-10-CM | POA: Diagnosis not present

## 2013-01-24 DIAGNOSIS — Z8719 Personal history of other diseases of the digestive system: Secondary | ICD-10-CM | POA: Diagnosis not present

## 2013-01-24 DIAGNOSIS — E119 Type 2 diabetes mellitus without complications: Secondary | ICD-10-CM | POA: Diagnosis not present

## 2013-01-24 DIAGNOSIS — IMO0002 Reserved for concepts with insufficient information to code with codable children: Secondary | ICD-10-CM | POA: Diagnosis not present

## 2013-01-24 DIAGNOSIS — Z87891 Personal history of nicotine dependence: Secondary | ICD-10-CM | POA: Diagnosis not present

## 2013-01-24 DIAGNOSIS — K529 Noninfective gastroenteritis and colitis, unspecified: Secondary | ICD-10-CM

## 2013-01-24 DIAGNOSIS — R109 Unspecified abdominal pain: Secondary | ICD-10-CM | POA: Insufficient documentation

## 2013-01-24 DIAGNOSIS — Z87448 Personal history of other diseases of urinary system: Secondary | ICD-10-CM | POA: Insufficient documentation

## 2013-01-24 DIAGNOSIS — R079 Chest pain, unspecified: Secondary | ICD-10-CM | POA: Diagnosis not present

## 2013-01-24 HISTORY — DX: Diverticulitis of intestine, part unspecified, without perforation or abscess without bleeding: K57.92

## 2013-01-24 LAB — URINALYSIS W MICROSCOPIC + REFLEX CULTURE
Bilirubin Urine: NEGATIVE
Glucose, UA: NEGATIVE mg/dL
Hgb urine dipstick: NEGATIVE
Ketones, ur: NEGATIVE mg/dL
Protein, ur: NEGATIVE mg/dL

## 2013-01-24 LAB — CBC WITH DIFFERENTIAL/PLATELET
Eosinophils Absolute: 0.3 10*3/uL (ref 0.0–0.7)
Eosinophils Relative: 3 % (ref 0–5)
Lymphs Abs: 2.7 10*3/uL (ref 0.7–4.0)
MCH: 22.9 pg — ABNORMAL LOW (ref 26.0–34.0)
MCV: 68.9 fL — ABNORMAL LOW (ref 78.0–100.0)
Monocytes Absolute: 0.9 10*3/uL (ref 0.1–1.0)
Monocytes Relative: 10 % (ref 3–12)
Platelets: 135 10*3/uL — ABNORMAL LOW (ref 150–400)
RBC: 5.63 MIL/uL (ref 4.22–5.81)

## 2013-01-24 LAB — COMPREHENSIVE METABOLIC PANEL
Alkaline Phosphatase: 59 U/L (ref 39–117)
BUN: 19 mg/dL (ref 6–23)
CO2: 25 mEq/L (ref 19–32)
Chloride: 106 mEq/L (ref 96–112)
GFR calc Af Amer: 62 mL/min — ABNORMAL LOW (ref >90)
Glucose, Bld: 93 mg/dL (ref 70–99)
Potassium: 3.4 mEq/L — ABNORMAL LOW (ref 3.5–5.1)
Total Bilirubin: 0.4 mg/dL (ref 0.3–1.2)

## 2013-01-24 LAB — LIPASE, BLOOD: Lipase: 35 U/L (ref 11–59)

## 2013-01-24 MED ORDER — ONDANSETRON HCL 4 MG/2ML IJ SOLN
4.0000 mg | INTRAMUSCULAR | Status: DC | PRN
  Administered 2013-01-24: 4 mg via INTRAVENOUS
  Filled 2013-01-24: qty 2

## 2013-01-24 MED ORDER — HYDROCODONE-ACETAMINOPHEN 5-325 MG PO TABS: ORAL_TABLET | ORAL | Status: DC

## 2013-01-24 MED ORDER — FAMOTIDINE IN NACL 20-0.9 MG/50ML-% IV SOLN
20.0000 mg | Freq: Once | INTRAVENOUS | Status: AC
  Administered 2013-01-24: 20 mg via INTRAVENOUS
  Filled 2013-01-24: qty 50

## 2013-01-24 MED ORDER — ONDANSETRON HCL 4 MG PO TABS: 4.0000 mg | ORAL_TABLET | Freq: Three times a day (TID) | ORAL | Status: DC | PRN

## 2013-01-24 MED ORDER — METRONIDAZOLE 500 MG PO TABS: 500.0000 mg | ORAL_TABLET | Freq: Three times a day (TID) | ORAL | Status: DC

## 2013-01-24 MED ORDER — CIPROFLOXACIN HCL 500 MG PO TABS: 500.0000 mg | ORAL_TABLET | Freq: Two times a day (BID) | ORAL | Status: DC

## 2013-01-24 MED ORDER — SODIUM CHLORIDE 0.9 % IV SOLN
INTRAVENOUS | Status: DC
  Administered 2013-01-24: 08:00:00 via INTRAVENOUS

## 2013-01-24 MED ORDER — FENTANYL CITRATE 0.05 MG/ML IJ SOLN
50.0000 ug | INTRAMUSCULAR | Status: AC | PRN
  Administered 2013-01-24 (×2): 50 ug via INTRAVENOUS
  Filled 2013-01-24 (×2): qty 2

## 2013-01-24 NOTE — ED Provider Notes (Signed)
CSN: [id number]     Arrival date & time 01/24/13  4098 History     First MD Initiated Contact with Patient 01/24/13 [phone]     Chief Complaint  Patient presents with  . Abdominal Pain  . Nausea  . Emesis    HPI Pt was seen at 0755. Per pt, c/o gradual onset and persistence of multiple intermittent episodes of N/V that began yesterday. Has been associated with generalized abd "cramping" pain.  Denies diarrhea, no CP/SOB, no back pain, no fevers, no black or blood in stools or emesis.     Past Medical History  Diagnosis Date  . Hypertension   . Depression   . Urinary retention   . Diabetes mellitus   . Renal disorder   . Chronic back pain   . Chronic hip pain   . Diverticulitis    Past Surgical History  Procedure Laterality Date  . Kidney transplant    . Hernia repair    . Nephrectomy transplanted organ     Family History  Problem Relation Age of Onset  . Hypertension Sister    History  Substance Use Topics  . Smoking status: Former Games developer  . Smokeless tobacco: Never Used  . Alcohol Use: No     Comment: occasionally    Review of Systems ROS: Statement: All systems negative except as marked or noted in the HPI; Constitutional: Negative for fever and chills. ; ; Eyes: Negative for eye pain, redness and discharge. ; ; ENMT: Negative for ear pain, hoarseness, nasal congestion, sinus pressure and sore throat. ; ; Cardiovascular: Negative for chest pain, palpitations, diaphoresis, dyspnea and peripheral edema. ; ; Respiratory: Negative for cough, wheezing and stridor. ; ; Gastrointestinal: +N/V, abd pain. Negative for diarrhea, blood in stool, hematemesis, jaundice and rectal bleeding. . ; ; Genitourinary: Negative for dysuria, flank pain and hematuria. ; ; Musculoskeletal: Negative for back pain and neck pain. Negative for swelling and trauma.; ; Skin: Negative for pruritus, rash, abrasions, blisters, bruising and skin lesion.; ; Neuro: Negative for headache, lightheadedness and  neck stiffness. Negative for weakness, altered level of consciousness , altered mental status, extremity weakness, paresthesias, involuntary movement, seizure and syncope.     Allergies  Codeine and Vasotec  Home Medications   Current Outpatient Rx  Name  Route  Sig  Dispense  Refill  . amLODipine (NORVASC) 2.5 MG tablet   Oral   Take 2.5 mg by mouth every morning.          Marland Kitchen atorvastatin (LIPITOR) 10 MG tablet   Oral   Take 10 mg by mouth every morning.          . cycloSPORINE (SANDIMMUNE) 100 MG capsule   Oral   Take 100 mg by mouth 2 (two) times daily. Take with 25 mg tablet to equal 125 mg twice daily         . cycloSPORINE (SANDIMMUNE) 25 MG capsule   Oral   Take 25 mg by mouth 2 (two) times daily. Take with 100 mg tablet to equal dose of 125 mg twice daily         . esomeprazole (NEXIUM) 40 MG capsule   Oral   Take 40 mg by mouth 2 (two) times daily.          . predniSONE (DELTASONE) 5 MG tablet   Oral   Take 5 mg by mouth every morning.          . Tamsulosin HCl (FLOMAX) 0.4 MG CAPS  Oral   Take 0.4 mg by mouth every morning.           BP 109/82  Pulse 84  Temp(Src) 98.2 F (36.8 C) (Oral)  Resp 20  Ht 5\' 11"  (1.803 m)  Wt 180 lb (81.647 kg)  BMI 25.12 kg/m2  SpO2 100% Physical Exam 0800: Physical examination:  Nursing notes reviewed; Vital signs and O2 SAT reviewed;  Constitutional: Well developed, Well nourished, Well hydrated, In no acute distress; Head:  Normocephalic, atraumatic; Eyes: EOMI, PERRL, No scleral icterus; ENMT: Mouth and pharynx normal, Mucous membranes moist; Neck: Supple, Full range of motion, No lymphadenopathy; Cardiovascular: Regular rate and rhythm, No gallop; Respiratory: Breath sounds clear & equal bilaterally, No rales, rhonchi, wheezes.  Speaking full sentences with ease, Normal respiratory effort/excursion; Chest: Nontender, Movement normal; Abdomen: Soft, Nontender, Nondistended, Normal bowel sounds. NT over kidney  graft site in left abd.; Genitourinary: No CVA tenderness; Extremities: Pulses normal, No tenderness, No edema, No calf edema or asymmetry.; Neuro: AA&Ox3, Major CN grossly intact.  Speech clear. No gross focal motor or sensory deficits in extremities.; Skin: Color normal, Warm, Dry.   ED Course   Procedures     MDM  MDM Reviewed: previous chart, nursing note and vitals Reviewed previous: labs and CT scan Interpretation: labs, x-ray and CT scan   Results for orders placed during the hospital encounter of 01/24/13  CBC WITH DIFFERENTIAL      Result Value Range   WBC 8.5  4.0 - 10.5 K/uL   RBC 5.63  4.22 - 5.81 MIL/uL   Hemoglobin 12.9 (*) 13.0 - 17.0 g/dL   HCT 16.1 (*) 09.6 - 04.5 %   MCV 68.9 (*) 78.0 - 100.0 fL   MCH 22.9 (*) 26.0 - 34.0 pg   MCHC 33.2  30.0 - 36.0 g/dL   RDW 40.9  81.1 - 91.4 %   Platelets 135 (*) 150 - 400 K/uL   Neutrophils Relative % 54  43 - 77 %   Neutro Abs 4.6  1.7 - 7.7 K/uL   Lymphocytes Relative 32  12 - 46 %   Lymphs Abs 2.7  0.7 - 4.0 K/uL   Monocytes Relative 10  3 - 12 %   Monocytes Absolute 0.9  0.1 - 1.0 K/uL   Eosinophils Relative 3  0 - 5 %   Eosinophils Absolute 0.3  0.0 - 0.7 K/uL   Basophils Relative 0  0 - 1 %   Basophils Absolute 0.0  0.0 - 0.1 K/uL  URINALYSIS W MICROSCOPIC + REFLEX CULTURE      Result Value Range   Color, Urine YELLOW  YELLOW   APPearance CLEAR  CLEAR   Specific Gravity, Urine 1.020  1.005 - 1.030   pH 6.5  5.0 - 8.0   Glucose, UA NEGATIVE  NEGATIVE mg/dL   Hgb urine dipstick NEGATIVE  NEGATIVE   Bilirubin Urine NEGATIVE  NEGATIVE   Ketones, ur NEGATIVE  NEGATIVE mg/dL   Protein, ur NEGATIVE  NEGATIVE mg/dL   Urobilinogen, UA 0.2  0.0 - 1.0 mg/dL   Nitrite NEGATIVE  NEGATIVE   Leukocytes, UA NEGATIVE  NEGATIVE   WBC, UA 0-2  <3 WBC/hpf   RBC / HPF 0-2  <3 RBC/hpf  COMPREHENSIVE METABOLIC PANEL      Result Value Range   Sodium 139  135 - 145 mEq/L   Potassium 3.4 (*) 3.5 - 5.1 mEq/L   Chloride 106   96 - 112 mEq/L   CO2  25  19 - 32 mEq/L   Glucose, Bld 93  70 - 99 mg/dL   BUN 19  6 - 23 mg/dL   Creatinine, Ser 1.61 (*) 0.50 - 1.35 mg/dL   Calcium 9.3  8.4 - 09.6 mg/dL   Total Protein 7.3  6.0 - 8.3 g/dL   Albumin 3.4 (*) 3.5 - 5.2 g/dL   AST 21  0 - 37 U/L   ALT 11  0 - 53 U/L   Alkaline Phosphatase 59  39 - 117 U/L   Total Bilirubin 0.4  0.3 - 1.2 mg/dL   GFR calc non Af Amer 53 (*) >90 mL/min   GFR calc Af Amer 62 (*) >90 mL/min  LIPASE, BLOOD      Result Value Range   Lipase 35  11 - 59 U/L   Ct Abdomen Pelvis Wo Contrast 01/24/2013   *RADIOLOGY REPORT*  Clinical Data: Abdominal pain  CT ABDOMEN AND PELVIS WITHOUT CONTRAST  Technique:  Multidetector CT imaging of the abdomen and pelvis was performed following the standard protocol without intravenous contrast. Oral contrast was administered.  Comparison:  November 16, 2012  Findings: Lung bases are clear.  No focal liver lesions are identified on this non intravenous contrast enhanced CT.  There is no biliary duct dilatation.  Spleen, pancreas, and adrenals appear normal.  Small native kidneys are markedly atrophic consistent with chronic renal failure.  There is no native renal mass, hydronephrosis, or calculus.  No ureterectasis ureteral calculus seen.  In the pelvis, there is a transplanted kidney in the left lower quadrant region which shows evidence suggesting a degree of compensatory hypertrophy.  There is no transplant kidney mass, calculus, hydronephrosis.  There is no perinephric fluid or thickening in this area.  In the pelvis, there is no mass or fluid collection.  Appendix appears normal.  There is no bowel obstruction.  No free air or portal venous air. There is some mild wall thickening in the distal descending colon and portions of the sigmoid colon, probably representing a degree of colitis.  There is not appear to be frank diverticulitis. Surrounding mesentery is not thickened, and there is no abscess.  There is no ascites,  adenopathy, or abscess in the pelvis.  There is atherosclerotic change in the aorta but no aneurysm.  There is osteoarthritic change in the left hip joint.  There are no blastic or lytic bone lesions.  IMPRESSION: Suspect a degree of descending colon and sigmoid colitis.  No diverticulitis or pericolonic abscess.  No bowel obstruction.  Appendix region appears normal.  Transplant kidney showing no evidence of hydronephrosis or inflammatory focus.  Native kidneys are markedly atrophic.  Osteoarthritic change left hip joint.   Original Report Authenticated By: Bretta Bang, M.D.   Dg Chest 2 View 01/24/2013   *RADIOLOGY REPORT*  Clinical Data: Pain and shortness of breath  CHEST - 2 VIEW  Comparison: December 27, 2013and July 08, 2010  Findings: There is no edema or consolidation.  The heart size and pulmonary vascularity are within normal limits.  There is slight anterior wedging of a mid thoracic vertebral body.  IMPRESSION: Slight anterior wedging of a mid thoracic vertebral body, not present on 2012 study.  There is no edema or consolidation.   Original Report Authenticated By: Bretta Bang, M.D.    Results for JURON, VORHEES (MRN [id number]) as of 01/24/2013 12:04  Ref. Range 06/06/2012 18:51 06/09/2012 16:08 08/24/2012 05:33 11/16/2012 11:07 01/24/2013 08:06  BUN Latest Range: 6-23  mg/dL 14 12 18 17 19   Creatinine Latest Range: 0.50-1.35 mg/dL 1.61 (H) 0.96 (H) 0.45 (H) 1.41 (H) 1.42 (H)     1200:  Pt has tol PO well while in the ED without N/V.  No stooling while in the ED.  Abd remains benign, VSS, afebrile.  WBC count normal. BUN/Cr per baseline. States he feels better and wants to go home now. Pt has been talking on the telephone most of his ED visit without distress. Will tx for mild colitis. Pt strongly encouraged to f/u with GI MD. Dx and testing d/w pt.  Questions answered.  Verb understanding, agreeable to d/c home with outpt f/u.    Laray Anger, DO 01/27/13 1757

## 2013-01-24 NOTE — ED Notes (Signed)
Water provided for PO challenge

## 2013-01-24 NOTE — ED Notes (Signed)
Pt c/o abd pain and n/v.  Denies diarrhea.  Unknown when lbm was.  Pt says thinks may have been yesterday.

## 2013-01-24 NOTE — ED Notes (Signed)
Patient with no complaints at this time. Respirations even and unlabored. Skin warm/dry. Discharge instructions reviewed with patient at this time. Patient given opportunity to voice concerns/ask questions. IV removed per policy and band-aid applied to site. Patient discharged at this time and left Emergency Department with steady gait.  

## 2013-01-27 ENCOUNTER — Encounter (HOSPITAL_COMMUNITY): Payer: Self-pay | Admitting: *Deleted

## 2013-01-27 ENCOUNTER — Emergency Department (HOSPITAL_COMMUNITY)
Admission: EM | Admit: 2013-01-27 | Discharge: 2013-01-27 | Disposition: A | Discharge status: Home / Self Care | Payer: Medicare Other | Attending: Emergency Medicine | Admitting: Emergency Medicine

## 2013-01-27 DIAGNOSIS — R531 Weakness: Secondary | ICD-10-CM

## 2013-01-27 DIAGNOSIS — E119 Type 2 diabetes mellitus without complications: Secondary | ICD-10-CM | POA: Diagnosis not present

## 2013-01-27 DIAGNOSIS — Z8659 Personal history of other mental and behavioral disorders: Secondary | ICD-10-CM | POA: Insufficient documentation

## 2013-01-27 DIAGNOSIS — Z87448 Personal history of other diseases of urinary system: Secondary | ICD-10-CM | POA: Diagnosis not present

## 2013-01-27 DIAGNOSIS — Z79899 Other long term (current) drug therapy: Secondary | ICD-10-CM | POA: Diagnosis not present

## 2013-01-27 DIAGNOSIS — R6883 Chills (without fever): Secondary | ICD-10-CM | POA: Diagnosis not present

## 2013-01-27 DIAGNOSIS — G8929 Other chronic pain: Secondary | ICD-10-CM | POA: Insufficient documentation

## 2013-01-27 DIAGNOSIS — R5381 Other malaise: Secondary | ICD-10-CM | POA: Diagnosis not present

## 2013-01-27 DIAGNOSIS — Z9889 Other specified postprocedural states: Secondary | ICD-10-CM | POA: Insufficient documentation

## 2013-01-27 DIAGNOSIS — R5383 Other fatigue: Secondary | ICD-10-CM | POA: Diagnosis not present

## 2013-01-27 DIAGNOSIS — I10 Essential (primary) hypertension: Secondary | ICD-10-CM | POA: Insufficient documentation

## 2013-01-27 DIAGNOSIS — Z8719 Personal history of other diseases of the digestive system: Secondary | ICD-10-CM | POA: Insufficient documentation

## 2013-01-27 DIAGNOSIS — Z87891 Personal history of nicotine dependence: Secondary | ICD-10-CM | POA: Diagnosis not present

## 2013-01-27 DIAGNOSIS — R0602 Shortness of breath: Secondary | ICD-10-CM | POA: Diagnosis not present

## 2013-01-27 DIAGNOSIS — R112 Nausea with vomiting, unspecified: Secondary | ICD-10-CM | POA: Insufficient documentation

## 2013-01-27 DIAGNOSIS — R109 Unspecified abdominal pain: Secondary | ICD-10-CM | POA: Insufficient documentation

## 2013-01-27 LAB — CBC WITH DIFFERENTIAL/PLATELET
Basophils Absolute: 0 10*3/uL (ref 0.0–0.1)
Basophils Relative: 1 % (ref 0–1)
Eosinophils Relative: 1 % (ref 0–5)
Lymphocytes Relative: 21 % (ref 12–46)
MCHC: 32.6 g/dL (ref 30.0–36.0)
MCV: 69 fL — ABNORMAL LOW (ref 78.0–100.0)
Platelets: 146 10*3/uL — ABNORMAL LOW (ref 150–400)
RDW: 15.3 % (ref 11.5–15.5)
WBC: 6.6 10*3/uL (ref 4.0–10.5)

## 2013-01-27 LAB — BASIC METABOLIC PANEL
BUN: 15 mg/dL (ref 6–23)
CO2: 23 mEq/L (ref 19–32)
Calcium: 9.2 mg/dL (ref 8.4–10.5)
Creatinine, Ser: 1.46 mg/dL — ABNORMAL HIGH (ref 0.50–1.35)
GFR calc Af Amer: 60 mL/min — ABNORMAL LOW (ref >90)

## 2013-01-27 LAB — URINALYSIS, ROUTINE W REFLEX MICROSCOPIC
Bilirubin Urine: NEGATIVE
Ketones, ur: NEGATIVE mg/dL
Leukocytes, UA: NEGATIVE
Nitrite: NEGATIVE
Protein, ur: NEGATIVE mg/dL
Urobilinogen, UA: 1 mg/dL (ref 0.0–1.0)
pH: 7 (ref 5.0–8.0)

## 2013-01-27 MED ORDER — ONDANSETRON HCL 4 MG/2ML IJ SOLN: 4.0000 mg | Freq: Once | INTRAMUSCULAR | Status: AC

## 2013-01-27 MED ORDER — SODIUM CHLORIDE 0.9 % IV BOLUS (SEPSIS)
500.0000 mL | Freq: Once | INTRAVENOUS | Status: AC
  Administered 2013-01-27: 500 mL via INTRAVENOUS

## 2013-01-27 MED ORDER — ONDANSETRON HCL 4 MG/2ML IJ SOLN
INTRAMUSCULAR | Status: AC
  Administered 2013-01-27: 4 mg via INTRAVENOUS
  Filled 2013-01-27: qty 2

## 2013-01-27 NOTE — ED Notes (Signed)
Pt denies nausea or pain at this time. Pt in NAD.

## 2013-01-27 NOTE — ED Notes (Signed)
Wednesday in ER for c/o nausea/vomiting/weakness.  Today began having chills and feeling "really bad."  No c/o pain.

## 2013-01-27 NOTE — ED Provider Notes (Signed)
Scribed for Robert Hutching, MD, the patient was seen in room APA07/APA07. This chart was scribed by Lewanda Rife, ED scribe. Patient's care was started at 1812  CSN: [id number]     Arrival date & time 01/27/13  1724 History     First MD Initiated Contact with Patient 01/27/13 1755     Chief Complaint  Patient presents with  . Chills  . Nausea  . Weakness   (Consider location/radiation/quality/duration/timing/severity/associated sxs/prior Treatment) The history is provided by the patient.   HPI Comments: Robert Durham is a 57 y.o. male who presents to the Emergency Department complaining of intermittent moderate shortness of breath onset this morning. Reports associated diffuse abdominal pain, chills, and nausea. Denies associated fevers. Denies aggravating or alleviating factors. Pt was seen 3 days ago in ED for the nausea, emesis, and abdominal pain. Reports hx of kidney transplant 20 years ago.  Past Medical History  Diagnosis Date  . Hypertension   . Depression   . Urinary retention   . Diabetes mellitus   . Renal disorder   . Chronic back pain   . Chronic hip pain   . Diverticulitis    Past Surgical History  Procedure Laterality Date  . Kidney transplant    . Hernia repair    . Nephrectomy transplanted organ     Family History  Problem Relation Age of Onset  . Hypertension Sister    History  Substance Use Topics  . Smoking status: Former Games developer  . Smokeless tobacco: Never Used  . Alcohol Use: 0.6 oz/week    1 Cans of beer per week     Comment: occasionally    Review of Systems  Constitutional: Positive for chills.  Gastrointestinal: Positive for nausea, vomiting and abdominal pain.  A complete 10 system review of systems was obtained and all systems are negative except as noted in the HPI and PMH.     Allergies  Codeine and Vasotec  Home Medications   Current Outpatient Rx  Name  Route  Sig  Dispense  Refill  . amLODipine (NORVASC) 2.5 MG tablet    Oral   Take 2.5 mg by mouth every morning.          Marland Kitchen atorvastatin (LIPITOR) 10 MG tablet   Oral   Take 10 mg by mouth every morning.          . ciprofloxacin (CIPRO) 500 MG tablet   Oral   Take 1 tablet (500 mg total) by mouth 2 (two) times daily. For the next 10 days   20 tablet   0   . cycloSPORINE (SANDIMMUNE) 100 MG capsule   Oral   Take 100 mg by mouth 2 (two) times daily. Take with 25 mg tablet to equal 125 mg twice daily         . cycloSPORINE (SANDIMMUNE) 25 MG capsule   Oral   Take 25 mg by mouth 2 (two) times daily. Take with 100 mg tablet to equal dose of 125 mg twice daily         . esomeprazole (NEXIUM) 40 MG capsule   Oral   Take 40 mg by mouth 2 (two) times daily.          Marland Kitchen HYDROcodone-acetaminophen (NORCO/VICODIN) 5-325 MG per tablet      1 or 2 tabs PO q6 hours prn pain   20 tablet   0   . metroNIDAZOLE (FLAGYL) 500 MG tablet   Oral   Take 1 tablet (500 mg  total) by mouth 3 (three) times daily. For the next 10 days   30 tablet   0   . ondansetron (ZOFRAN) 4 MG tablet   Oral   Take 1 tablet (4 mg total) by mouth every 8 (eight) hours as needed for nausea.   8 tablet   0   . predniSONE (DELTASONE) 5 MG tablet   Oral   Take 5 mg by mouth every morning.          . Tamsulosin HCl (FLOMAX) 0.4 MG CAPS   Oral   Take 0.4 mg by mouth every morning.           BP 133/107  Pulse 79  Temp(Src) 98.2 F (36.8 C) (Oral)  Resp 22  Ht 5\' 11"  (1.803 m)  Wt 180 lb (81.647 kg)  BMI 25.12 kg/m2  SpO2 98% Physical Exam  Nursing note and vitals reviewed. Constitutional: He is oriented to person, place, and time. He appears well-developed and well-nourished. No distress.  HENT:  Head: Normocephalic and atraumatic.  Eyes: Conjunctivae and EOM are normal. Pupils are equal, round, and reactive to light.  Neck: Normal range of motion. Neck supple. No tracheal deviation present.  Pulmonary/Chest: Effort normal. No respiratory distress.   Abdominal: Soft. He exhibits no distension and no mass. There is no tenderness. There is no rebound and no guarding.  Musculoskeletal: Normal range of motion.  Neurological: He is alert and oriented to person, place, and time.  Skin: Skin is warm and dry.  Psychiatric: He has a normal mood and affect. His behavior is normal.    ED Course   Procedures (including critical care time) Medications  ondansetron (ZOFRAN) injection 4 mg (4 mg Intravenous Given 01/27/13 1802)  sodium chloride 0.9 % bolus 500 mL (0 mL Intravenous Stopped 01/27/13 1858)    Labs Reviewed  BASIC METABOLIC PANEL - Abnormal; Notable for the following:    Glucose, Bld 101 (*)    Creatinine, Ser 1.46 (*)    GFR calc non Af Amer 52 (*)    GFR calc Af Amer 60 (*)    All other components within normal limits  CBC WITH DIFFERENTIAL - Abnormal; Notable for the following:    RBC 5.87 (*)    MCV 69.0 (*)    MCH 22.5 (*)    Platelets 146 (*)    All other components within normal limits  URINALYSIS, ROUTINE W REFLEX MICROSCOPIC   No results found. No diagnosis found.  MDM  Patient is symptom-free at this time.  Specifically, patient has no chest pain, no dyspnea, no abdominal pain, no fever, no sweats or  No chills.  Screening tests normal. I personally performed the services described in this documentation, which was scribed in my presence. The recorded information has been reviewed and is accurate.    Robert Hutching, MD 01/27/13 2036

## 2013-01-30 DIAGNOSIS — Z9225 Personal history of immunosupression therapy: Secondary | ICD-10-CM | POA: Diagnosis not present

## 2013-01-30 DIAGNOSIS — R82998 Other abnormal findings in urine: Secondary | ICD-10-CM | POA: Diagnosis not present

## 2013-01-30 DIAGNOSIS — Z79899 Other long term (current) drug therapy: Secondary | ICD-10-CM | POA: Diagnosis not present

## 2013-01-30 DIAGNOSIS — Z8619 Personal history of other infectious and parasitic diseases: Secondary | ICD-10-CM | POA: Diagnosis not present

## 2013-01-30 DIAGNOSIS — Z48298 Encounter for aftercare following other organ transplant: Secondary | ICD-10-CM | POA: Diagnosis not present

## 2013-01-30 DIAGNOSIS — D899 Disorder involving the immune mechanism, unspecified: Secondary | ICD-10-CM | POA: Diagnosis not present

## 2013-01-30 DIAGNOSIS — Z940 Kidney transplant status: Secondary | ICD-10-CM | POA: Diagnosis not present

## 2013-01-30 DIAGNOSIS — I10 Essential (primary) hypertension: Secondary | ICD-10-CM | POA: Diagnosis not present

## 2013-01-30 DIAGNOSIS — M25559 Pain in unspecified hip: Secondary | ICD-10-CM | POA: Diagnosis not present

## 2013-01-30 DIAGNOSIS — IMO0002 Reserved for concepts with insufficient information to code with codable children: Secondary | ICD-10-CM | POA: Diagnosis not present

## 2013-01-30 DIAGNOSIS — R112 Nausea with vomiting, unspecified: Secondary | ICD-10-CM | POA: Diagnosis not present

## 2013-02-28 ENCOUNTER — Encounter: Payer: Self-pay | Admitting: Internal Medicine

## 2013-03-09 DIAGNOSIS — N182 Chronic kidney disease, stage 2 (mild): Secondary | ICD-10-CM | POA: Diagnosis not present

## 2013-03-09 DIAGNOSIS — I10 Essential (primary) hypertension: Secondary | ICD-10-CM | POA: Diagnosis not present

## 2013-03-09 DIAGNOSIS — Z23 Encounter for immunization: Secondary | ICD-10-CM | POA: Diagnosis not present

## 2013-03-20 ENCOUNTER — Encounter: Payer: Self-pay | Admitting: Internal Medicine

## 2013-03-22 ENCOUNTER — Telehealth: Payer: Self-pay | Admitting: Gastroenterology

## 2013-03-22 ENCOUNTER — Ambulatory Visit: Payer: Medicare Other | Admitting: Gastroenterology

## 2013-03-22 NOTE — Telephone Encounter (Signed)
Please send letter for f/u.  

## 2013-03-22 NOTE — Telephone Encounter (Signed)
Pt was a no show

## 2013-03-23 ENCOUNTER — Encounter: Payer: Self-pay | Admitting: Gastroenterology

## 2013-03-23 NOTE — Telephone Encounter (Signed)
Mailed letter °

## 2013-03-30 ENCOUNTER — Emergency Department (HOSPITAL_COMMUNITY)
Admission: EM | Admit: 2013-03-30 | Discharge: 2013-03-30 | Disposition: A | Discharge status: Home / Self Care | Payer: Medicare Other | Attending: Emergency Medicine | Admitting: Emergency Medicine

## 2013-03-30 ENCOUNTER — Encounter (HOSPITAL_COMMUNITY): Payer: Self-pay | Admitting: Emergency Medicine

## 2013-03-30 DIAGNOSIS — M169 Osteoarthritis of hip, unspecified: Secondary | ICD-10-CM | POA: Diagnosis not present

## 2013-03-30 DIAGNOSIS — I10 Essential (primary) hypertension: Secondary | ICD-10-CM | POA: Diagnosis not present

## 2013-03-30 DIAGNOSIS — Z79899 Other long term (current) drug therapy: Secondary | ICD-10-CM | POA: Insufficient documentation

## 2013-03-30 DIAGNOSIS — E119 Type 2 diabetes mellitus without complications: Secondary | ICD-10-CM | POA: Diagnosis not present

## 2013-03-30 DIAGNOSIS — Z8659 Personal history of other mental and behavioral disorders: Secondary | ICD-10-CM | POA: Diagnosis not present

## 2013-03-30 DIAGNOSIS — Z87448 Personal history of other diseases of urinary system: Secondary | ICD-10-CM | POA: Diagnosis not present

## 2013-03-30 DIAGNOSIS — Z8719 Personal history of other diseases of the digestive system: Secondary | ICD-10-CM | POA: Diagnosis not present

## 2013-03-30 DIAGNOSIS — Z87891 Personal history of nicotine dependence: Secondary | ICD-10-CM | POA: Diagnosis not present

## 2013-03-30 DIAGNOSIS — IMO0002 Reserved for concepts with insufficient information to code with codable children: Secondary | ICD-10-CM | POA: Diagnosis not present

## 2013-03-30 DIAGNOSIS — G8929 Other chronic pain: Secondary | ICD-10-CM | POA: Diagnosis not present

## 2013-03-30 DIAGNOSIS — M25559 Pain in unspecified hip: Secondary | ICD-10-CM | POA: Diagnosis not present

## 2013-03-30 DIAGNOSIS — M1610 Unilateral primary osteoarthritis, unspecified hip: Secondary | ICD-10-CM | POA: Insufficient documentation

## 2013-03-30 MED ORDER — HYDROCODONE-ACETAMINOPHEN 5-325 MG PO TABS: 1.0000 | ORAL_TABLET | ORAL | Status: DC | PRN

## 2013-03-30 NOTE — ED Provider Notes (Signed)
CSN: [id number]     Arrival date & time 03/30/13  1823 History   First MD Initiated Contact with Patient 03/30/13 1854     Chief Complaint  Patient presents with  . Hip Pain   (Consider location/radiation/quality/duration/timing/severity/associated sxs/prior Treatment) HPI Comments: Robert Durham is a 57 y.o. Male presenting with pain with movement of his left hip which flared up 1 week ago,  Stating this pain started gradually.  He denies new injury or fall and has not been standing or stressing the joint more than normal. His pain is consistent with prior episodes of left hip pain.  He has known arthritis in the joint, most recently had xrays done 6 months ago.  He has taken no medicines prior to arrival for this,  But has tried voltaren gel prescribed by his pcp in the past which was not very helpful.  He has had no swelling or rash and denies any current low back pain.     The history is provided by the patient.    Past Medical History  Diagnosis Date  . Hypertension   . Depression   . Urinary retention   . Diabetes mellitus   . Renal disorder   . Chronic back pain   . Chronic hip pain   . Diverticulitis    Past Surgical History  Procedure Laterality Date  . Kidney transplant    . Hernia repair    . Nephrectomy transplanted organ    . Colonoscopy  05/27/2003    WUJ:WJXBJY terminal ileum,rectum, and colon   Family History  Problem Relation Age of Onset  . Hypertension Sister    History  Substance Use Topics  . Smoking status: Former Games developer  . Smokeless tobacco: Never Used  . Alcohol Use: 0.6 oz/week    1 Cans of beer per week     Comment: occasionally    Review of Systems  Constitutional: Negative for fever and chills.  Musculoskeletal: Positive for arthralgias and joint swelling. Negative for myalgias.  Neurological: Negative for weakness and numbness.    Allergies  Codeine and Vasotec  Home Medications   Current Outpatient Rx  Name  Route  Sig   Dispense  Refill  . atorvastatin (LIPITOR) 10 MG tablet   Oral   Take 10 mg by mouth every morning.          . cycloSPORINE (SANDIMMUNE) 100 MG capsule   Oral   Take 100 mg by mouth 2 (two) times daily. Take with 25 mg tablet to equal 125 mg twice daily         . cycloSPORINE (SANDIMMUNE) 25 MG capsule   Oral   Take 25 mg by mouth 2 (two) times daily. Take with 100 mg tablet to equal dose of 125 mg twice daily         . esomeprazole (NEXIUM) 40 MG capsule   Oral   Take 40 mg by mouth 2 (two) times daily.          . predniSONE (DELTASONE) 5 MG tablet   Oral   Take 5 mg by mouth every morning.          . Tamsulosin HCl (FLOMAX) 0.4 MG CAPS   Oral   Take 0.4 mg by mouth every morning.          Marland Kitchen HYDROcodone-acetaminophen (NORCO/VICODIN) 5-325 MG per tablet   Oral   Take 1 tablet by mouth every 4 (four) hours as needed for pain.   20 tablet  0    BP 106/73  Pulse 93  Temp(Src) 98.1 F (36.7 C) (Oral)  Ht 5\' 11"  (1.803 m)  Wt 180 lb (81.647 kg)  BMI 25.12 kg/m2  SpO2 100% Physical Exam  Constitutional: He appears well-developed and well-nourished.  HENT:  Head: Atraumatic.  Neck: Normal range of motion.  Cardiovascular:  Pulses equal bilaterally  Musculoskeletal: He exhibits tenderness. He exhibits no edema.       Left hip: He exhibits bony tenderness. He exhibits no swelling, no crepitus and no deformity.       Lumbar back: He exhibits no tenderness.  ttp left lateral hip above greater trochanter.  No edema,  No erythema, no rash.  Distal pulses intact,  No peripheral edema.  Knee nontender.    Neurological: He is alert. He has normal strength. He displays normal reflexes. No sensory deficit.  Equal strength  Skin: Skin is warm and dry.  Psychiatric: He has a normal mood and affect.    ED Course  Procedures (including critical care time) Labs Review Labs Reviewed - No data to display Imaging Review No results found.  EKG Interpretation    None       MDM   1. Hip pain, chronic, left   2. DJD (degenerative joint disease) of hip    xrays from 4/14 reviewed.  Pt with djd of this joint.  He was prescribed hydrocodone,  Encouraged heat tx.  F/u with pcp this week if sx persist. Pt without injury or fall,  No indication to repeat xrays today.  VSS,  No fever or documented hx of fever.    Burgess Amor, PA-C 03/30/13 1927

## 2013-03-30 NOTE — ED Notes (Signed)
Patient given discharge instruction, verbalized understand. Patient ambulatory out of the department.  

## 2013-03-30 NOTE — ED Provider Notes (Signed)
  Medical screening examination/treatment/procedure(s) were performed by non-physician practitioner and as supervising physician I was immediately available for consultation/collaboration.    Jamiel Goncalves, MD 03/30/13 2038 

## 2013-03-30 NOTE — ED Notes (Signed)
Pain lt hip and leg for 1 week, No known injury

## 2013-04-12 ENCOUNTER — Emergency Department (HOSPITAL_COMMUNITY)
Admission: EM | Admit: 2013-04-12 | Discharge: 2013-04-12 | Disposition: A | Discharge status: Home / Self Care | Payer: Medicare Other | Attending: Emergency Medicine | Admitting: Emergency Medicine

## 2013-04-12 ENCOUNTER — Emergency Department (HOSPITAL_COMMUNITY): Payer: Medicare Other

## 2013-04-12 ENCOUNTER — Encounter (HOSPITAL_COMMUNITY): Payer: Self-pay | Admitting: Emergency Medicine

## 2013-04-12 DIAGNOSIS — R51 Headache: Secondary | ICD-10-CM | POA: Diagnosis not present

## 2013-04-12 DIAGNOSIS — F329 Major depressive disorder, single episode, unspecified: Secondary | ICD-10-CM | POA: Diagnosis not present

## 2013-04-12 DIAGNOSIS — Z79899 Other long term (current) drug therapy: Secondary | ICD-10-CM | POA: Diagnosis not present

## 2013-04-12 DIAGNOSIS — IMO0002 Reserved for concepts with insufficient information to code with codable children: Secondary | ICD-10-CM | POA: Insufficient documentation

## 2013-04-12 DIAGNOSIS — E119 Type 2 diabetes mellitus without complications: Secondary | ICD-10-CM | POA: Insufficient documentation

## 2013-04-12 DIAGNOSIS — Z87891 Personal history of nicotine dependence: Secondary | ICD-10-CM | POA: Insufficient documentation

## 2013-04-12 DIAGNOSIS — Z87448 Personal history of other diseases of urinary system: Secondary | ICD-10-CM | POA: Diagnosis not present

## 2013-04-12 DIAGNOSIS — R110 Nausea: Secondary | ICD-10-CM | POA: Insufficient documentation

## 2013-04-12 DIAGNOSIS — G8929 Other chronic pain: Secondary | ICD-10-CM | POA: Insufficient documentation

## 2013-04-12 DIAGNOSIS — Z8719 Personal history of other diseases of the digestive system: Secondary | ICD-10-CM | POA: Diagnosis not present

## 2013-04-12 DIAGNOSIS — I10 Essential (primary) hypertension: Secondary | ICD-10-CM | POA: Insufficient documentation

## 2013-04-12 DIAGNOSIS — F3289 Other specified depressive episodes: Secondary | ICD-10-CM | POA: Insufficient documentation

## 2013-04-12 MED ORDER — OXYCODONE-ACETAMINOPHEN 5-325 MG PO TABS
2.0000 | ORAL_TABLET | Freq: Once | ORAL | Status: AC
  Administered 2013-04-12: 2 via ORAL
  Filled 2013-04-12: qty 2

## 2013-04-12 MED ORDER — TRAMADOL HCL 50 MG PO TABS: 50.0000 mg | ORAL_TABLET | Freq: Four times a day (QID) | ORAL | Status: DC | PRN

## 2013-04-12 MED ORDER — NAPROXEN 500 MG PO TABS: 500.0000 mg | ORAL_TABLET | Freq: Two times a day (BID) | ORAL | Status: DC

## 2013-04-12 NOTE — Discharge Instructions (Signed)
Headache:  You are having a headache. No specific cause was found today for your headache. It may have been a migraine or other cause of headache. Stress, anxiety, fatigue, and depression are common triggers for headaches. Your headache today does not appear to be life-threatening or require hospitalization, but often the exact cause of headaches is not determined in the emergency department. Therefore, followup with your doctor is very important to find out what may have caused your headache, and whether or not you need any further diagnostic testing or treatment. Sometimes headaches can appear benign but then more serious symptoms can develop which should prompt an immediate reevaluation by your doctor or the emergency department.  Seek immediate medical attention if:  You develop possible problems with medications prescribed. The medications don't resolve your headache, if it recurs, or if you have multiple episodes of vomiting or can't take fluids by mouth You have a change from the usual headache. If you developed a sudden severe headache or confusion, become poorly responsive or faint, developed a fever above 100.4 or problems breathing, have a change in speech, vision, swallowing or understanding, or developed new weakness, numbness, tingling, incoordination or have a seizure.  If you don't have a family doctor to follow up with, see the follow up list below - call this morning for a follow-up appointment in the next 1-2 days.  RESOURCE GUIDE  Dental Problems  Patients with Medicaid: [facility]                     [facility] [address]                                           [address] Phone:  [phone]                                                  Phone:  [phone]  If unable to pay or uninsured, contact:  [facility] or [facility] to become qualified for the adult dental clinic.  Chronic Pain Problems Contact [facility]  [phone] Patients need to be referred by their primary care doctor.  Insufficient Money for Medicine Contact [employer]:  call "211" or [facility] 271-5999.  No Primary Care Doctor Call [facility]  832-8000 Other agencies that provide inexpensive medical care    [facility]  [phone]    [facility]  [phone]    [facility]  [phone]    [facility] Clinic  832-4777    [facility]  [phone]    [facility]  [phone]  Psychological Services [facility]  [phone] [facility]  [phone] [facility]   800 853-5163 (emergency services 641-4993)  Substance Abuse Resources [facility]  336-882-2125 [facility] 336-784-9470 [facility] 336-285-9073 [facility] 336-845-3988 Residential & Outpatient Substance Abuse Program  800-659-3381  Abuse/Neglect [place] Child Abuse Hotline (336) 641-3795 [place] [facility] Abuse Hotline 800-378-5315 (After Hours)  Emergency Shelter [facility] (336) 271-5985  [facility] (336) 275-9566 [facility] (704) 372-4663  MRSA Hotline #:     832-7006    [place] [facility]  [facility] [place] [facility]     [employer]                          [facility] [address] [place]                       [address]      [address]  [place]                                                [place]                            [place] Phone:  [phone]                                   Phone:  [phone]                 Phone:  [phone]  [facility] Phone:  [phone]  [place] Child Abuse Hotline (336) 342-1394 (336) 342-3537 (After Hours)   

## 2013-04-12 NOTE — ED Provider Notes (Signed)
CSN: [id number]     Arrival date & time 04/12/13  4098 History  This chart was scribed for Vida Roller, MD by Blanchard Kelch, ED Scribe. The patient was seen in room APA12/APA12. Patient's care was started at 7:52 AM.    Chief Complaint  Patient presents with  . Headache    The history is provided by the patient. No language interpreter was used.    HPI Comments: Robert Durham is a 57 y.o. male who presents to the Emergency Department complaining of sudden onset, intermittent right sided headaches that began about a week ago. The pain is described as throbbing. The headaches last between 5-10 minutes. The headaches are associated with nausea but no vomiting or photophobia. He has been unable to sleep normally due to the pain. He reports a history of headaches but denies they have ever been this severe. He has a past surgical history of a kidney transplant, which he states was successful. He is on cyclosporin and prednisone for his anti-rejection medication. He denies vision changes, coordination difficulty, sore throat, stiff neck. He denies using heater recently or being near any carbon monoxide sources. He has been seen here in the ED multiple times in the past few months for minor complaints.   Past Medical History  Diagnosis Date  . Hypertension   . Depression   . Urinary retention   . Diabetes mellitus   . Renal disorder   . Chronic back pain   . Chronic hip pain   . Diverticulitis    Past Surgical History  Procedure Laterality Date  . Kidney transplant    . Hernia repair    . Nephrectomy transplanted organ    . Colonoscopy  05/27/2003    JXB:[id number] terminal ileum,rectum, and colon   Family History  Problem Relation Age of Onset  . Hypertension Sister    History  Substance Use Topics  . Smoking status: Former Games developer  . Smokeless tobacco: Never Used  . Alcohol Use: 0.6 oz/week    1 Cans of beer per week     Comment: occasionally    Review of Systems  Eyes:  Negative for photophobia and visual disturbance.  Gastrointestinal: Positive for nausea. Negative for vomiting.  Neurological: Positive for headaches.  All other systems reviewed and are negative.    Allergies  Codeine and Vasotec  Home Medications   Current Outpatient Rx  Name  Route  Sig  Dispense  Refill  . atorvastatin (LIPITOR) 10 MG tablet   Oral   Take 10 mg by mouth every morning.          . cycloSPORINE (SANDIMMUNE) 100 MG capsule   Oral   Take 100 mg by mouth 2 (two) times daily. Take with 25 mg tablet to equal 125 mg twice daily         . cycloSPORINE (SANDIMMUNE) 25 MG capsule   Oral   Take 25 mg by mouth 2 (two) times daily. Take with 100 mg tablet to equal dose of 125 mg twice daily         . esomeprazole (NEXIUM) 40 MG capsule   Oral   Take 40 mg by mouth 2 (two) times daily.          Marland Kitchen HYDROcodone-acetaminophen (NORCO/VICODIN) 5-325 MG per tablet   Oral   Take 1 tablet by mouth every 4 (four) hours as needed for pain.   20 tablet   0   . predniSONE (DELTASONE) 5 MG tablet  Oral   Take 5 mg by mouth every morning.          . Tamsulosin HCl (FLOMAX) 0.4 MG CAPS   Oral   Take 0.4 mg by mouth every morning.          . traMADol (ULTRAM) 50 MG tablet   Oral   Take 1 tablet (50 mg total) by mouth every 6 (six) hours as needed for pain.   15 tablet   0    Triage Vitals: BP 113/80  Pulse 65  Temp(Src) 98.1 F (36.7 C) (Oral)  Resp 16  Ht 5\' 11"  (1.803 m)  Wt 180 lb (81.647 kg)  BMI 25.12 kg/m2  SpO2 99%  Physical Exam  Nursing note and vitals reviewed. Constitutional: He is oriented to person, place, and time. He appears well-developed and well-nourished.  HENT:  Head: Normocephalic and atraumatic.  Eyes: Pupils are equal, round, and reactive to light.  Neck: Normal range of motion. Neck supple.  Cardiovascular: Normal rate, regular rhythm and normal heart sounds.   No murmur heard. Pulmonary/Chest: Effort normal and breath  sounds normal. No respiratory distress. He has no wheezes.  Abdominal: Soft. Bowel sounds are normal. There is no tenderness. There is no rebound.  Musculoskeletal: He exhibits no edema.  Lymphadenopathy:    He has no cervical adenopathy.  Neurological: He is alert and oriented to person, place, and time.  Speech clear, pupils equal round reactive to light, extraocular movements intact Normal peripheral visual fields Cranial nerves III through XII normal including no facial droop Follows commands, moves all extremities x4, normal strength to bilateral upper and lower extremities at all major muscle groups including grip Sensation normal to light touch and pinprick Coordination intact, no limb ataxia, finger-nose-finger normal Rapid alternating movements normal No pronator drift Gait normal  Skin: Skin is warm and dry.  Psychiatric: He has a normal mood and affect.    ED Course  Procedures (including critical care time)  DIAGNOSTIC STUDIES: Oxygen Saturation is 99% on room air, normal by my interpretation.    COORDINATION OF CARE: 8:03 AM - Patient verbalizes understanding and agrees with treatment plan.    Labs Review Labs Reviewed - No data to display Imaging Review Ct Head Wo Contrast  04/12/2013   CLINICAL DATA:  Right-sided headache for 1 week with nausea. Hypertension. Diabetes.  EXAM: CT HEAD WITHOUT CONTRAST  TECHNIQUE: Contiguous axial images were obtained from the base of the skull through the vertex without intravenous contrast.  COMPARISON:  12/20/2009  FINDINGS: Sinuses/Soft tissues: Clear paranasal sinuses and mastoid air cells.  Intracranial: No mass lesion, hemorrhage, hydrocephalus, acute infarct, intra-axial, or extra-axial fluid collection.  IMPRESSION: Normal head CT.   Electronically Signed   By: Jeronimo Greaves M.D.   On: 04/12/2013 08:28    EKG Interpretation   None       MDM   1. Headache    Pt has ha, no lesions on CT - immunosuppressed but no  infection, no fever, no stiff neck and no neuro findings on exam.  CT neg, better with meds, home with meds, no nsaids with hx of renal dz.  Meds given in ED:  Medications  oxyCODONE-acetaminophen (PERCOCET/ROXICET) 5-325 MG per tablet 2 tablet (2 tablets Oral Given 04/12/13 0810)    New Prescriptions   TRAMADOL (ULTRAM) 50 MG TABLET    Take 1 tablet (50 mg total) by mouth every 6 (six) hours as needed for pain.      I personally  performed the services described in this documentation, which was scribed in my presence. The recorded information has been reviewed and is accurate.      Vida Roller, MD 04/12/13 [phone]

## 2013-04-12 NOTE — ED Notes (Signed)
Headache to right side of head and face intermittently x 1 wk.  C/o nausea, denies v/d.  Denies light/sound sensitivity.

## 2013-04-25 DIAGNOSIS — N400 Enlarged prostate without lower urinary tract symptoms: Secondary | ICD-10-CM | POA: Diagnosis not present

## 2013-05-14 DIAGNOSIS — Z940 Kidney transplant status: Secondary | ICD-10-CM | POA: Diagnosis not present

## 2013-05-14 DIAGNOSIS — I10 Essential (primary) hypertension: Secondary | ICD-10-CM | POA: Diagnosis not present

## 2013-05-22 DIAGNOSIS — N186 End stage renal disease: Secondary | ICD-10-CM | POA: Diagnosis not present

## 2013-05-22 DIAGNOSIS — IMO0002 Reserved for concepts with insufficient information to code with codable children: Secondary | ICD-10-CM | POA: Diagnosis not present

## 2013-05-22 DIAGNOSIS — Z9225 Personal history of immunosupression therapy: Secondary | ICD-10-CM | POA: Diagnosis not present

## 2013-05-22 DIAGNOSIS — I10 Essential (primary) hypertension: Secondary | ICD-10-CM | POA: Diagnosis not present

## 2013-05-22 DIAGNOSIS — M25559 Pain in unspecified hip: Secondary | ICD-10-CM | POA: Diagnosis not present

## 2013-05-22 DIAGNOSIS — Z8719 Personal history of other diseases of the digestive system: Secondary | ICD-10-CM | POA: Diagnosis not present

## 2013-05-22 DIAGNOSIS — I120 Hypertensive chronic kidney disease with stage 5 chronic kidney disease or end stage renal disease: Secondary | ICD-10-CM | POA: Diagnosis not present

## 2013-05-22 DIAGNOSIS — Z79899 Other long term (current) drug therapy: Secondary | ICD-10-CM | POA: Diagnosis not present

## 2013-05-22 DIAGNOSIS — Z8659 Personal history of other mental and behavioral disorders: Secondary | ICD-10-CM | POA: Diagnosis not present

## 2013-05-22 DIAGNOSIS — Z8739 Personal history of other diseases of the musculoskeletal system and connective tissue: Secondary | ICD-10-CM | POA: Diagnosis not present

## 2013-05-22 DIAGNOSIS — N039 Chronic nephritic syndrome with unspecified morphologic changes: Secondary | ICD-10-CM | POA: Diagnosis not present

## 2013-05-22 DIAGNOSIS — D899 Disorder involving the immune mechanism, unspecified: Secondary | ICD-10-CM | POA: Diagnosis not present

## 2013-05-22 DIAGNOSIS — Z940 Kidney transplant status: Secondary | ICD-10-CM | POA: Diagnosis not present

## 2013-05-22 DIAGNOSIS — Z48298 Encounter for aftercare following other organ transplant: Secondary | ICD-10-CM | POA: Diagnosis not present

## 2013-06-25 ENCOUNTER — Encounter (HOSPITAL_COMMUNITY): Payer: Self-pay | Admitting: Emergency Medicine

## 2013-06-25 ENCOUNTER — Emergency Department (HOSPITAL_COMMUNITY)
Admission: EM | Admit: 2013-06-25 | Discharge: 2013-06-25 | Disposition: A | Discharge status: Home / Self Care | Payer: Medicare Other | Attending: Emergency Medicine | Admitting: Emergency Medicine

## 2013-06-25 DIAGNOSIS — Z79899 Other long term (current) drug therapy: Secondary | ICD-10-CM | POA: Diagnosis not present

## 2013-06-25 DIAGNOSIS — F3289 Other specified depressive episodes: Secondary | ICD-10-CM | POA: Insufficient documentation

## 2013-06-25 DIAGNOSIS — Z87891 Personal history of nicotine dependence: Secondary | ICD-10-CM | POA: Diagnosis not present

## 2013-06-25 DIAGNOSIS — J111 Influenza due to unidentified influenza virus with other respiratory manifestations: Secondary | ICD-10-CM | POA: Diagnosis not present

## 2013-06-25 DIAGNOSIS — F329 Major depressive disorder, single episode, unspecified: Secondary | ICD-10-CM | POA: Diagnosis not present

## 2013-06-25 DIAGNOSIS — I10 Essential (primary) hypertension: Secondary | ICD-10-CM | POA: Diagnosis not present

## 2013-06-25 DIAGNOSIS — R5383 Other fatigue: Secondary | ICD-10-CM

## 2013-06-25 DIAGNOSIS — E119 Type 2 diabetes mellitus without complications: Secondary | ICD-10-CM | POA: Insufficient documentation

## 2013-06-25 DIAGNOSIS — M549 Dorsalgia, unspecified: Secondary | ICD-10-CM | POA: Diagnosis not present

## 2013-06-25 DIAGNOSIS — IMO0001 Reserved for inherently not codable concepts without codable children: Secondary | ICD-10-CM | POA: Diagnosis not present

## 2013-06-25 DIAGNOSIS — R5381 Other malaise: Secondary | ICD-10-CM | POA: Diagnosis not present

## 2013-06-25 DIAGNOSIS — IMO0002 Reserved for concepts with insufficient information to code with codable children: Secondary | ICD-10-CM | POA: Diagnosis not present

## 2013-06-25 DIAGNOSIS — R110 Nausea: Secondary | ICD-10-CM | POA: Diagnosis not present

## 2013-06-25 DIAGNOSIS — Z87448 Personal history of other diseases of urinary system: Secondary | ICD-10-CM | POA: Diagnosis not present

## 2013-06-25 DIAGNOSIS — M2550 Pain in unspecified joint: Secondary | ICD-10-CM | POA: Insufficient documentation

## 2013-06-25 DIAGNOSIS — Z8719 Personal history of other diseases of the digestive system: Secondary | ICD-10-CM | POA: Insufficient documentation

## 2013-06-25 DIAGNOSIS — G8929 Other chronic pain: Secondary | ICD-10-CM | POA: Insufficient documentation

## 2013-06-25 MED ORDER — DIPHENHYDRAMINE HCL 25 MG PO CAPS
25.0000 mg | ORAL_CAPSULE | Freq: Once | ORAL | Status: AC
  Administered 2013-06-25: 25 mg via ORAL
  Filled 2013-06-25: qty 1

## 2013-06-25 MED ORDER — OSELTAMIVIR PHOSPHATE 75 MG PO CAPS: 75.0000 mg | ORAL_CAPSULE | Freq: Two times a day (BID) | ORAL | Status: DC

## 2013-06-25 MED ORDER — IBUPROFEN 800 MG PO TABS
800.0000 mg | ORAL_TABLET | Freq: Once | ORAL | Status: DC
  Filled 2013-06-25: qty 1

## 2013-06-25 MED ORDER — OSELTAMIVIR PHOSPHATE 75 MG PO CAPS
75.0000 mg | ORAL_CAPSULE | Freq: Once | ORAL | Status: AC
Start: 2013-06-25 — End: 2013-06-25
  Administered 2013-06-25: 75 mg via ORAL
  Filled 2013-06-25: qty 1

## 2013-06-25 NOTE — ED Notes (Signed)
Complain of nausea and aching since yesterday

## 2013-06-25 NOTE — Discharge Instructions (Signed)
It is important that she use a mask until you're symptoms have resolved. Please wash hands frequently. Please increase fluids, maintain good hydration. Please use Tamiflu daily until all taken. Use ibuprofen every 6 hours for fever or body aches. Use Benadryl every 6 hours for congestion and cough. Please see Dr. Berdine Addison, or return to the emergency department if any changes or deterioration in your condition. Influenza, Adult Influenza ("the flu") is a viral infection of the respiratory tract. It occurs more often in winter months because people spend more time in close contact with one another. Influenza can make you feel very sick. Influenza easily spreads from person to person (contagious). CAUSES  Influenza is caused by a virus that infects the respiratory tract. You can catch the virus by breathing in droplets from an infected person's cough or sneeze. You can also catch the virus by touching something that was recently contaminated with the virus and then touching your mouth, nose, or eyes. SYMPTOMS  Symptoms typically last 4 to 10 days and may include:  Fever.  Chills.  Headache, body aches, and muscle aches.  Sore throat.  Chest discomfort and cough.  Poor appetite.  Weakness or feeling tired.  Dizziness.  Nausea or vomiting. DIAGNOSIS  Diagnosis of influenza is often made based on your history and a physical exam. A nose or throat swab test can be done to confirm the diagnosis. RISKS AND COMPLICATIONS You may be at risk for a more severe case of influenza if you smoke cigarettes, have diabetes, have chronic heart disease (such as heart failure) or lung disease (such as asthma), or if you have a weakened immune system. Elderly people and pregnant women are also at risk for more serious infections. The most common complication of influenza is a lung infection (pneumonia). Sometimes, this complication can require emergency medical care and may be life-threatening. PREVENTION  An  annual influenza vaccination (flu shot) is the best way to avoid getting influenza. An annual flu shot is now routinely recommended for all adults in the [place] TREATMENT  In mild cases, influenza goes away on its own. Treatment is directed at relieving symptoms. For more severe cases, your caregiver may prescribe antiviral medicines to shorten the sickness. Antibiotic medicines are not effective, because the infection is caused by a virus, not by bacteria. HOME CARE INSTRUCTIONS  Only take over-the-counter or prescription medicines for pain, discomfort, or fever as directed by your caregiver.  Use a cool mist humidifier to make breathing easier.  Get plenty of rest until your temperature returns to normal. This usually takes 3 to 4 days.  Drink enough fluids to keep your urine clear or pale yellow.  Cover your mouth and nose when coughing or sneezing, and wash your hands well to avoid spreading the virus.  Stay home from work or school until your fever has been gone for at least 1 full day. SEEK MEDICAL CARE IF:   You have chest pain or a deep cough that worsens or produces more mucus.  You have nausea, vomiting, or diarrhea. SEEK IMMEDIATE MEDICAL CARE IF:   You have difficulty breathing, shortness of breath, or your skin or nails turn bluish.  You have severe neck pain or stiffness.  You have a severe headache, facial pain, or earache.  You have a worsening or recurring fever.  You have nausea or vomiting that cannot be controlled. MAKE SURE YOU:  Understand these instructions.  Will watch your condition.  Will get help right away if you  are not doing well or get worse. Document Released: 05/28/2000 Document Revised: 11/30/2011 Document Reviewed: 08/30/2011 [facility] Patient Information 2014 [place], [place].

## 2013-06-25 NOTE — ED Provider Notes (Signed)
CSN: [id number]     Arrival date & time 06/25/13  7793 History   First MD Initiated Contact with Patient 06/25/13 0848     Chief Complaint  Patient presents with  . Generalized Body Aches   (Consider location/radiation/quality/duration/timing/severity/associated sxs/prior Treatment) HPI Comments: Pt presents to the ED with generalized aching with chills. He did not check his temp at the time. He c/o sneezing and having runny nose. He took the flu shoot shot this year. No vomiting. No diarrhea. No rash. He has tried tylenol without improvement. The symptoms got worse yesterday.  The history is provided by the patient.    Past Medical History  Diagnosis Date  . Hypertension   . Depression   . Urinary retention   . Diabetes mellitus   . Renal disorder   . Chronic back pain   . Chronic hip pain   . Diverticulitis    Past Surgical History  Procedure Laterality Date  . Kidney transplant    . Hernia repair    . Nephrectomy transplanted organ    . Colonoscopy  05/27/2003    [id number] terminal ileum,rectum, and colon   Family History  Problem Relation Age of Onset  . Hypertension Sister    History  Substance Use Topics  . Smoking status: Former Research scientist (life sciences)  . Smokeless tobacco: Never Used  . Alcohol Use: 0.6 oz/week    1 Cans of beer per week     Comment: occasionally    Review of Systems  Constitutional: Positive for chills and fatigue. Negative for activity change.       All ROS Neg except as noted in HPI  HENT: Negative for nosebleeds.   Eyes: Negative for photophobia and discharge.  Respiratory: Negative for cough, shortness of breath and wheezing.   Cardiovascular: Negative for chest pain and palpitations.  Gastrointestinal: Positive for nausea. Negative for abdominal pain and blood in stool.  Genitourinary: Negative for dysuria, frequency and hematuria.  Musculoskeletal: Positive for arthralgias, back pain and myalgias. Negative for neck pain.  Skin: Negative.    Neurological: Negative for dizziness, seizures and speech difficulty.  Psychiatric/Behavioral: Negative for hallucinations and confusion.       Depression    Allergies  Codeine and Vasotec  Home Medications   Current Outpatient Rx  Name  Route  Sig  Dispense  Refill  . atorvastatin (LIPITOR) 10 MG tablet   Oral   Take 10 mg by mouth every morning.          . cycloSPORINE (SANDIMMUNE) 100 MG capsule   Oral   Take 100 mg by mouth 2 (two) times daily. Take with 25 mg tablet to equal 125 mg twice daily         . cycloSPORINE (SANDIMMUNE) 25 MG capsule   Oral   Take 25 mg by mouth 2 (two) times daily. Take with 100 mg tablet to equal dose of 125 mg twice daily         . esomeprazole (NEXIUM) 40 MG capsule   Oral   Take 40 mg by mouth 2 (two) times daily.          . predniSONE (DELTASONE) 5 MG tablet   Oral   Take 5 mg by mouth every morning.          . Tamsulosin HCl (FLOMAX) 0.4 MG CAPS   Oral   Take 0.4 mg by mouth every morning.           BP 103/79  Pulse 80  Temp(Src) 97.8 F (36.6 C) (Oral)  Resp 20  Ht 5\' 11"  (1.803 m)  Wt 181 lb (82.101 kg)  BMI 25.26 kg/m2  SpO2 100% Physical Exam  Nursing note and vitals reviewed. Constitutional: He is oriented to person, place, and time. He appears well-developed and well-nourished.  Non-toxic appearance.  HENT:  Head: Normocephalic.  Right Ear: Tympanic membrane and external ear normal.  Left Ear: Tympanic membrane and external ear normal.  Nasal congestion.  Eyes: EOM and lids are normal. Pupils are equal, round, and reactive to light.  Neck: Normal range of motion. Neck supple. Carotid bruit is not present.  Cardiovascular: Normal rate, regular rhythm, normal heart sounds, intact distal pulses and normal pulses.   Pulmonary/Chest: Breath sounds normal. No respiratory distress. He has no wheezes.  Abdominal: Soft. Bowel sounds are normal. He exhibits no distension. There is no tenderness. There is no  guarding.  Musculoskeletal: Normal range of motion.  Lymphadenopathy:       Head (right side): No submandibular adenopathy present.       Head (left side): No submandibular adenopathy present.    He has no cervical adenopathy.  Neurological: He is alert and oriented to person, place, and time. He has normal strength. No cranial nerve deficit or sensory deficit.  Skin: Skin is warm and dry. No rash noted.  Psychiatric: He has a normal mood and affect. His speech is normal.    ED Course  Procedures (including critical care time) Labs Review Labs Reviewed - No data to display Imaging Review No results found.  EKG Interpretation   None       MDM  No diagnosis found. *I have reviewed nursing notes, vital signs, and all appropriate lab and imaging results for this patient.** Vital signs stable after tylenol. Pulse ox 100% on room air. Exam and hx are consistent with influenza. Pt advised to wash hands frequently. Increase fluids. Tylenol or ibuprofen for fever and aching. Rx for tamiflu given.   Lenox Ahr, PA-C 06/26/13 2248

## 2013-06-30 NOTE — ED Provider Notes (Signed)
Medical screening examination/treatment/procedure(s) were performed by non-physician practitioner and as supervising physician I was immediately available for consultation/collaboration.  EKG Interpretation   None         Tanna Furry, MD 06/30/13 [phone]

## 2013-07-13 DIAGNOSIS — K219 Gastro-esophageal reflux disease without esophagitis: Secondary | ICD-10-CM | POA: Diagnosis not present

## 2013-07-13 DIAGNOSIS — Z940 Kidney transplant status: Secondary | ICD-10-CM | POA: Diagnosis not present

## 2013-08-01 DIAGNOSIS — Z0000 Encounter for general adult medical examination without abnormal findings: Secondary | ICD-10-CM | POA: Diagnosis not present

## 2013-09-12 DIAGNOSIS — K219 Gastro-esophageal reflux disease without esophagitis: Secondary | ICD-10-CM | POA: Diagnosis not present

## 2013-09-12 DIAGNOSIS — Z940 Kidney transplant status: Secondary | ICD-10-CM | POA: Diagnosis not present

## 2013-09-12 DIAGNOSIS — I10 Essential (primary) hypertension: Secondary | ICD-10-CM | POA: Diagnosis not present

## 2013-09-12 DIAGNOSIS — E785 Hyperlipidemia, unspecified: Secondary | ICD-10-CM | POA: Diagnosis not present

## 2013-09-29 ENCOUNTER — Emergency Department (HOSPITAL_COMMUNITY)
Admission: EM | Admit: 2013-09-29 | Discharge: 2013-09-29 | Disposition: A | Discharge status: Home / Self Care | Payer: Medicare Other | Attending: Emergency Medicine | Admitting: Emergency Medicine

## 2013-09-29 ENCOUNTER — Encounter (HOSPITAL_COMMUNITY): Payer: Self-pay | Admitting: Emergency Medicine

## 2013-09-29 DIAGNOSIS — Z9889 Other specified postprocedural states: Secondary | ICD-10-CM | POA: Insufficient documentation

## 2013-09-29 DIAGNOSIS — M169 Osteoarthritis of hip, unspecified: Secondary | ICD-10-CM | POA: Insufficient documentation

## 2013-09-29 DIAGNOSIS — G8929 Other chronic pain: Secondary | ICD-10-CM | POA: Diagnosis not present

## 2013-09-29 DIAGNOSIS — Z8719 Personal history of other diseases of the digestive system: Secondary | ICD-10-CM | POA: Diagnosis not present

## 2013-09-29 DIAGNOSIS — IMO0002 Reserved for concepts with insufficient information to code with codable children: Secondary | ICD-10-CM | POA: Diagnosis not present

## 2013-09-29 DIAGNOSIS — F329 Major depressive disorder, single episode, unspecified: Secondary | ICD-10-CM | POA: Diagnosis not present

## 2013-09-29 DIAGNOSIS — F3289 Other specified depressive episodes: Secondary | ICD-10-CM | POA: Insufficient documentation

## 2013-09-29 DIAGNOSIS — Z87891 Personal history of nicotine dependence: Secondary | ICD-10-CM | POA: Diagnosis not present

## 2013-09-29 DIAGNOSIS — I10 Essential (primary) hypertension: Secondary | ICD-10-CM | POA: Insufficient documentation

## 2013-09-29 DIAGNOSIS — R031 Nonspecific low blood-pressure reading: Secondary | ICD-10-CM | POA: Diagnosis not present

## 2013-09-29 DIAGNOSIS — E119 Type 2 diabetes mellitus without complications: Secondary | ICD-10-CM | POA: Diagnosis not present

## 2013-09-29 DIAGNOSIS — Z87448 Personal history of other diseases of urinary system: Secondary | ICD-10-CM | POA: Insufficient documentation

## 2013-09-29 DIAGNOSIS — M1610 Unilateral primary osteoarthritis, unspecified hip: Secondary | ICD-10-CM | POA: Insufficient documentation

## 2013-09-29 MED ORDER — HYDROCODONE-ACETAMINOPHEN 5-325 MG PO TABS: 1.0000 | ORAL_TABLET | ORAL | Status: DC | PRN

## 2013-09-29 MED ORDER — DEXAMETHASONE 6 MG PO TABS: ORAL_TABLET | ORAL | Status: DC

## 2013-09-29 NOTE — Discharge Instructions (Signed)
Osteoarthritis Osteoarthritis is a disease that causes soreness and swelling (inflammation) of a joint. It occurs when the cartilage at the affected joint wears down. Cartilage acts as a cushion, covering the ends of bones where they meet to form a joint. Osteoarthritis is the most common form of arthritis. It often occurs in older people. The joints affected most often by this condition include those in the:  Ends of the fingers.  Thumbs.  Neck.  Lower back.  Knees.  Hips. CAUSES  Over time, the cartilage that covers the ends of bones begins to wear away. This causes bone to rub on bone, producing pain and stiffness in the affected joints.  RISK FACTORS Certain factors can increase your chances of having osteoarthritis, including:  Older age.  Excessive body weight.  Overuse of joints. SIGNS AND SYMPTOMS   Pain, swelling, and stiffness in the joint.  Over time, the joint may lose its normal shape.  Small deposits of bone (osteophytes) may grow on the edges of the joint.  Bits of bone or cartilage can break off and float inside the joint space. This may cause more pain and damage. DIAGNOSIS  Your health care provider will do a physical exam and ask about your symptoms. Various tests may be ordered, such as:  X-rays of the affected joint.  An MRI scan.  Blood tests to rule out other types of arthritis.  Joint fluid tests. This involves using a needle to draw fluid from the joint and examining the fluid under a microscope. TREATMENT  Goals of treatment are to control pain and improve joint function. Treatment plans may include:  A prescribed exercise program that allows for rest and joint relief.  A weight control plan.  Pain relief techniques, such as:  Properly applied heat and cold.  Electric pulses delivered to nerve endings under the skin (transcutaneous electrical nerve stimulation, TENS).  Massage.  Certain nutritional supplements.  Medicines to  control pain, such as:  Acetaminophen.  Nonsteroidal anti-inflammatory drugs (NSAIDs), such as naproxen.  Narcotic or central-acting agents, such as tramadol.  Corticosteroids. These can be given orally or as an injection.  Surgery to reposition the bones and relieve pain (osteotomy) or to remove loose pieces of bone and cartilage. Joint replacement may be needed in advanced states of osteoarthritis. HOME CARE INSTRUCTIONS   Only take over-the-counter or prescription medicines as directed by your health care provider. Take all medicines exactly as instructed.  Maintain a healthy weight. Follow your health care provider's instructions for weight control. This may include dietary instructions.  Exercise as directed. Your health care provider can recommend specific types of exercise. These may include:  Strengthening exercises These are done to strengthen the muscles that support joints affected by arthritis. They can be performed with weights or with exercise bands to add resistance.  Aerobic activities These are exercises, such as brisk walking or low-impact aerobics, that get your heart pumping.  Range-of-motion activities These keep your joints limber.  Balance and agility exercises These help you maintain daily living skills.  Rest your affected joints as directed by your health care provider.  Follow up with your health care provider as directed. SEEK MEDICAL CARE IF:   Your skin turns red.  You develop a rash in addition to your joint pain.  You have worsening joint pain. SEEK IMMEDIATE MEDICAL CARE IF:  You have a significant loss of weight or appetite.  You have a fever along with joint or muscle aches.  You have   night sweats. FOR MORE INFORMATION  National Institute of Arthritis and Musculoskeletal and Skin Diseases: www.niams.nih.gov National Institute on Aging: www.nia.nih.gov American College of Rheumatology: www.rheumatology.org Document Released: 05/31/2005  Document Revised: 03/21/2013 Document Reviewed: 02/05/2013 [employer] Patient Information 2014 [employer].  

## 2013-09-29 NOTE — ED Notes (Signed)
Patient c/o left hip pain that radiates into left leg. Per patient "has been going on for a while" but progressively worse x2 weeks. Per patient seen here before for pain.

## 2013-09-29 NOTE — ED Provider Notes (Signed)
Medical screening examination/treatment/procedure(s) were performed by non-physician practitioner and as supervising physician I was immediately available for consultation/collaboration.  Richarda Blade, MD 09/29/13 [phone]

## 2013-09-29 NOTE — ED Provider Notes (Signed)
CSN: [id number]     Arrival date & time 09/29/13  1222 History   First MD Initiated Contact with Patient 09/29/13 1328     Chief Complaint  Patient presents with  . Hip Pain     (Consider location/radiation/quality/duration/timing/severity/associated sxs/prior Treatment) Patient is a 58 y.o. male presenting with hip pain. The history is provided by the patient.  Hip Pain This is a chronic problem. The current episode started more than 1 month ago. The problem occurs daily. The problem has been gradually worsening. Associated symptoms include arthralgias. Pertinent negatives include no abdominal pain, chest pain, coughing, neck pain or numbness. The symptoms are aggravated by standing and walking. He has tried acetaminophen for the symptoms. The treatment provided no relief.    Past Medical History  Diagnosis Date  . Hypertension   . Depression   . Urinary retention   . Diabetes mellitus   . Renal disorder   . Chronic back pain   . Chronic hip pain   . Diverticulitis    Past Surgical History  Procedure Laterality Date  . Kidney transplant    . Hernia repair    . Nephrectomy transplanted organ    . Colonoscopy  05/27/2003    YJE:[id number] terminal ileum,rectum, and colon   Family History  Problem Relation Age of Onset  . Hypertension Sister    History  Substance Use Topics  . Smoking status: Former Smoker -- 1.00 packs/day for 5 years    Types: Cigarettes    Quit date: 06/14/1972  . Smokeless tobacco: Never Used  . Alcohol Use: 0.6 oz/week    1 Cans of beer per week     Comment: occasionally    Review of Systems  Constitutional: Negative for activity change.       All ROS Neg except as noted in HPI  HENT: Negative for nosebleeds.   Eyes: Negative for photophobia and discharge.  Respiratory: Negative for cough, shortness of breath and wheezing.   Cardiovascular: Negative for chest pain and palpitations.  Gastrointestinal: Negative for abdominal pain and blood in  stool.  Genitourinary: Negative for dysuria, frequency and hematuria.  Musculoskeletal: Positive for arthralgias and back pain. Negative for neck pain.  Skin: Negative.   Neurological: Negative for dizziness, seizures, speech difficulty and numbness.  Psychiatric/Behavioral: Negative for hallucinations and confusion.       Depression      Allergies  Codeine and Vasotec  Home Medications   Prior to Admission medications   Medication Sig Start Date End Date Taking? Authorizing Provider  atorvastatin (LIPITOR) 10 MG tablet Take 10 mg by mouth every morning.    Yes Historical Provider, MD  cycloSPORINE (SANDIMMUNE) 100 MG capsule Take 100 mg by mouth 2 (two) times daily. Take with 25 mg tablet to equal 125 mg twice daily   Yes Historical Provider, MD  cycloSPORINE (SANDIMMUNE) 25 MG capsule Take 25 mg by mouth 2 (two) times daily. Take with 100 mg tablet to equal dose of 125 mg twice daily   Yes Historical Provider, MD  esomeprazole (NEXIUM) 40 MG capsule Take 40 mg by mouth 2 (two) times daily.    Yes Historical Provider, MD  predniSONE (DELTASONE) 5 MG tablet Take 5 mg by mouth every morning.    Yes Historical Provider, MD  tamsulosin (FLOMAX) 0.4 MG CAPS capsule Take 0.4 mg by mouth daily.   Yes Historical Provider, MD  dexamethasone (DECADRON) 6 MG tablet 1 po bid with food 09/29/13   Lenox Ahr, PA-C  HYDROcodone-acetaminophen (NORCO/VICODIN) 5-325 MG per tablet Take 1 tablet by mouth every 4 (four) hours as needed for moderate pain. 09/29/13   Lenox Ahr, PA-C   BP 93/67  Pulse 87  Temp(Src) 98.3 F (36.8 C) (Oral)  Resp 18  Ht 5\' 11"  (1.803 m)  Wt 165 lb (74.844 kg)  BMI 23.02 kg/m2  SpO2 98% Physical Exam  Nursing note and vitals reviewed. Constitutional: He is oriented to person, place, and time. He appears well-developed and well-nourished.  Non-toxic appearance.  HENT:  Head: Normocephalic.  Right Ear: Tympanic membrane and external ear normal.  Left Ear:  Tympanic membrane and external ear normal.  Eyes: EOM and lids are normal. Pupils are equal, round, and reactive to light.  Neck: Normal range of motion. Neck supple. Carotid bruit is not present.  Cardiovascular: Normal rate, regular rhythm, normal heart sounds, intact distal pulses and normal pulses.   Pulmonary/Chest: Breath sounds normal. No respiratory distress.  Abdominal: Soft. Bowel sounds are normal. There is no tenderness. There is no guarding.  Musculoskeletal: Normal range of motion.  Is pain with attempted range of motion of the left hip. The hip is not hot. There is no palpable effusion. There's no deformity present. There is crepitus with range of motion of the left knee. The Achilles tendon is intact. The dorsalis pedis pulses 2+.  Lymphadenopathy:       Head (right side): No submandibular adenopathy present.       Head (left side): No submandibular adenopathy present.    He has no cervical adenopathy.  Neurological: He is alert and oriented to person, place, and time. He has normal strength. No cranial nerve deficit or sensory deficit.  Skin: Skin is warm and dry.  Psychiatric: He has a normal mood and affect. His speech is normal.    ED Course  Procedures (including critical care time) Labs Review Labs Reviewed - No data to display  Imaging Review No results found.   EKG Interpretation None      MDM I have reviewed the x-rays from April of 2014. The patient has degenerative joint disease changes involving the left hip. There's been no recent accident or injury involving the hip. The patient has not had any temperature elevation. The examination is consistent with degenerative joint disease changes of the left hip.  Plan at this time is for the patient to followup with Dr. Berdine Addison. A prescription for Decadron and Norco have been given to the patient to use over the weekend. Patient is encouraged to change positions especially standing with caution.    Final diagnoses:   Degenerative joint disease (DJD) of hip    **I have reviewed nursing notes, vital signs, and all appropriate lab and imaging results for this patient.Lenox Ahr, PA-C 09/29/13 [phone]

## 2013-09-29 NOTE — ED Notes (Signed)
Pt states he is having pain in L. Hip that travels down L. Leg. States this has been happening off and on for last couple of weeks. Has been taking some Tylenol but has not experienced any relief. Describes pain at [facility] and aching. Denies any recent injury.

## 2013-11-06 DIAGNOSIS — Z940 Kidney transplant status: Secondary | ICD-10-CM | POA: Diagnosis not present

## 2013-11-06 DIAGNOSIS — Z48298 Encounter for aftercare following other organ transplant: Secondary | ICD-10-CM | POA: Diagnosis not present

## 2013-11-06 DIAGNOSIS — I10 Essential (primary) hypertension: Secondary | ICD-10-CM | POA: Diagnosis not present

## 2013-12-06 ENCOUNTER — Encounter (HOSPITAL_COMMUNITY): Payer: Self-pay | Admitting: Emergency Medicine

## 2013-12-06 ENCOUNTER — Emergency Department (HOSPITAL_COMMUNITY)
Admission: EM | Admit: 2013-12-06 | Discharge: 2013-12-06 | Disposition: A | Discharge status: Home / Self Care | Payer: Medicare Other | Attending: Emergency Medicine | Admitting: Emergency Medicine

## 2013-12-06 ENCOUNTER — Emergency Department (HOSPITAL_COMMUNITY): Payer: Medicare Other

## 2013-12-06 DIAGNOSIS — IMO0002 Reserved for concepts with insufficient information to code with codable children: Secondary | ICD-10-CM | POA: Insufficient documentation

## 2013-12-06 DIAGNOSIS — I10 Essential (primary) hypertension: Secondary | ICD-10-CM | POA: Diagnosis not present

## 2013-12-06 DIAGNOSIS — H539 Unspecified visual disturbance: Secondary | ICD-10-CM | POA: Diagnosis not present

## 2013-12-06 DIAGNOSIS — R197 Diarrhea, unspecified: Secondary | ICD-10-CM | POA: Insufficient documentation

## 2013-12-06 DIAGNOSIS — Z87891 Personal history of nicotine dependence: Secondary | ICD-10-CM | POA: Insufficient documentation

## 2013-12-06 DIAGNOSIS — S39011A Strain of muscle, fascia and tendon of abdomen, initial encounter: Secondary | ICD-10-CM

## 2013-12-06 DIAGNOSIS — Z8659 Personal history of other mental and behavioral disorders: Secondary | ICD-10-CM | POA: Insufficient documentation

## 2013-12-06 DIAGNOSIS — E119 Type 2 diabetes mellitus without complications: Secondary | ICD-10-CM | POA: Diagnosis not present

## 2013-12-06 DIAGNOSIS — Z8719 Personal history of other diseases of the digestive system: Secondary | ICD-10-CM | POA: Insufficient documentation

## 2013-12-06 DIAGNOSIS — Z940 Kidney transplant status: Secondary | ICD-10-CM | POA: Diagnosis not present

## 2013-12-06 DIAGNOSIS — Z9089 Acquired absence of other organs: Secondary | ICD-10-CM | POA: Diagnosis not present

## 2013-12-06 DIAGNOSIS — G8929 Other chronic pain: Secondary | ICD-10-CM | POA: Insufficient documentation

## 2013-12-06 DIAGNOSIS — Y939 Activity, unspecified: Secondary | ICD-10-CM | POA: Insufficient documentation

## 2013-12-06 DIAGNOSIS — X58XXXA Exposure to other specified factors, initial encounter: Secondary | ICD-10-CM | POA: Insufficient documentation

## 2013-12-06 DIAGNOSIS — M542 Cervicalgia: Secondary | ICD-10-CM | POA: Insufficient documentation

## 2013-12-06 DIAGNOSIS — Z87442 Personal history of urinary calculi: Secondary | ICD-10-CM | POA: Diagnosis not present

## 2013-12-06 DIAGNOSIS — Z87448 Personal history of other diseases of urinary system: Secondary | ICD-10-CM | POA: Insufficient documentation

## 2013-12-06 DIAGNOSIS — Z79899 Other long term (current) drug therapy: Secondary | ICD-10-CM | POA: Insufficient documentation

## 2013-12-06 DIAGNOSIS — R109 Unspecified abdominal pain: Secondary | ICD-10-CM | POA: Diagnosis not present

## 2013-12-06 DIAGNOSIS — Y929 Unspecified place or not applicable: Secondary | ICD-10-CM | POA: Insufficient documentation

## 2013-12-06 DIAGNOSIS — R51 Headache: Secondary | ICD-10-CM | POA: Diagnosis not present

## 2013-12-06 DIAGNOSIS — R1032 Left lower quadrant pain: Secondary | ICD-10-CM | POA: Diagnosis not present

## 2013-12-06 HISTORY — DX: Kidney transplant status: Z94.0

## 2013-12-06 HISTORY — DX: Calculus of kidney: N20.0

## 2013-12-06 LAB — CBC WITH DIFFERENTIAL/PLATELET
BASOS PCT: 0 % (ref 0–1)
Basophils Absolute: 0 10*3/uL (ref 0.0–0.1)
EOS PCT: 2 % (ref 0–5)
Eosinophils Absolute: 0.2 10*3/uL (ref 0.0–0.7)
HEMATOCRIT: 41.9 % (ref 39.0–52.0)
Hemoglobin: 13.8 g/dL (ref 13.0–17.0)
LYMPHS ABS: 1.5 10*3/uL (ref 0.7–4.0)
Lymphocytes Relative: 19 % (ref 12–46)
MCH: 22.9 pg — ABNORMAL LOW (ref 26.0–34.0)
MCHC: 32.9 g/dL (ref 30.0–36.0)
MCV: 69.6 fL — AB (ref 78.0–100.0)
MONOS PCT: 5 % (ref 3–12)
Monocytes Absolute: 0.4 10*3/uL (ref 0.1–1.0)
NEUTROS ABS: 5.6 10*3/uL (ref 1.7–7.7)
Neutrophils Relative %: 74 % (ref 43–77)
Platelets: 135 10*3/uL — ABNORMAL LOW (ref 150–400)
RBC: 6.02 MIL/uL — AB (ref 4.22–5.81)
RDW: 15.3 % (ref 11.5–15.5)
WBC: 7.7 10*3/uL (ref 4.0–10.5)

## 2013-12-06 LAB — BASIC METABOLIC PANEL
BUN: 17 mg/dL (ref 6–23)
CHLORIDE: 100 meq/L (ref 96–112)
CO2: 26 meq/L (ref 19–32)
CREATININE: 1.6 mg/dL — AB (ref 0.50–1.35)
Calcium: 9.3 mg/dL (ref 8.4–10.5)
GFR calc Af Amer: 53 mL/min — ABNORMAL LOW (ref >90)
GFR calc non Af Amer: 46 mL/min — ABNORMAL LOW (ref >90)
Glucose, Bld: 87 mg/dL (ref 70–99)
Potassium: 4.5 mEq/L (ref 3.7–5.3)
Sodium: 139 mEq/L (ref 137–147)

## 2013-12-06 LAB — URINE MICROSCOPIC-ADD ON

## 2013-12-06 LAB — URINALYSIS, ROUTINE W REFLEX MICROSCOPIC
BILIRUBIN URINE: NEGATIVE
GLUCOSE, UA: NEGATIVE mg/dL
Hgb urine dipstick: NEGATIVE
KETONES UR: NEGATIVE mg/dL
Nitrite: NEGATIVE
PH: 7 (ref 5.0–8.0)
Protein, ur: NEGATIVE mg/dL
Specific Gravity, Urine: 1.015 (ref 1.005–1.030)
Urobilinogen, UA: 2 mg/dL — ABNORMAL HIGH (ref 0.0–1.0)

## 2013-12-06 MED ORDER — TRAMADOL HCL 50 MG PO TABS
50.0000 mg | ORAL_TABLET | Freq: Once | ORAL | Status: AC
  Administered 2013-12-06: 50 mg via ORAL
  Filled 2013-12-06: qty 1

## 2013-12-06 MED ORDER — HYDROCODONE-ACETAMINOPHEN 5-325 MG PO TABS: 1.0000 | ORAL_TABLET | Freq: Four times a day (QID) | ORAL | Status: DC | PRN

## 2013-12-06 NOTE — ED Provider Notes (Signed)
CSN: [id number]     Arrival date & time 12/06/13  1800 History   First MD Initiated Contact with Patient 12/06/13 1930   This chart was scribed for Fredia Sorrow, MD by Anastasia Pall, ED Scribe. This patient was seen in room APA10/APA10 and the patient's care was started at 7:31 PM.   Chief Complaint  Patient presents with  . Abdominal Pain   Patient is a 58 y.o. male presenting with abdominal pain. The history is provided by the patient. No language interpreter was used.  Abdominal Pain Pain location:  LLQ Pain quality: sharp   Pain radiates to:  L leg and groin Pain severity:  Severe Duration:  2 days Timing:  Constant Progression:  Unchanged Chronicity:  New Worsened by:  Movement Associated symptoms: vomiting   Associated symptoms: no chest pain, no chills, no diarrhea, no fever, no shortness of breath and no sore throat    HPI Comments: Robert Durham is a 58 y.o. male who presents to the Emergency Department complaining of sharp, constant, 9/10, LLQ abdominal pain, onset 2 days ago, that radiates to his left groin and down his front left LE. He denies any obvious injuries. He reports movement exacerbates his pain. Patient reports a history of kidney stone and a left kidney transplant in 1994. He states his pain is somewhat similar to his previous kidney stone. He reports associated vomiting this morning, but no episodes since then. Pt denies diarrhea, nausea, or other abdominal pain. Pt also reports h/o depression.   PCP - Maggie Font, MD  Past Medical History  Diagnosis Date  . Hypertension   . Depression   . Urinary retention   . Diabetes mellitus   . Renal disorder   . Chronic back pain   . Chronic hip pain   . Diverticulitis   . Kidney stone   . H/O kidney transplant    Past Surgical History  Procedure Laterality Date  . Kidney transplant    . Hernia repair    . Nephrectomy transplanted organ    . Colonoscopy  05/27/2003    IRJ:JOACZY terminal ileum,rectum,  and colon   Family History  Problem Relation Age of Onset  . Hypertension Sister    History  Substance Use Topics  . Smoking status: Former Smoker -- 1.00 packs/day for 5 years    Types: Cigarettes    Quit date: 06/14/1972  . Smokeless tobacco: Never Used  . Alcohol Use: 0.6 oz/week    1 Cans of beer per week     Comment: occasionally    Review of Systems  Constitutional: Negative for fever and chills.  HENT: Negative for rhinorrhea and sore throat.   Eyes: Positive for visual disturbance (black spots).       Visual disturbances (Black spots)  Respiratory: Negative for shortness of breath.   Cardiovascular: Negative for chest pain.  Gastrointestinal: Positive for vomiting and abdominal pain. Negative for diarrhea.       Abdominal pain radiates down left leg  Musculoskeletal: Positive for back pain and neck pain. Negative for joint swelling.  Skin: Negative for rash.  Neurological: Positive for headaches.  Hematological: Does not bruise/bleed easily.  Psychiatric/Behavioral: Negative for confusion.    Allergies  Codeine and Vasotec  Home Medications   Prior to Admission medications   Medication Sig Start Date End Date Taking? Authorizing Provider  atorvastatin (LIPITOR) 10 MG tablet Take 10 mg by mouth every morning.    Yes Historical Provider, MD  cycloSPORINE (SANDIMMUNE)  100 MG capsule Take 100 mg by mouth 2 (two) times daily. Take with 25 mg tablet to equal 125 mg twice daily   Yes Historical Provider, MD  cycloSPORINE (SANDIMMUNE) 25 MG capsule Take 25 mg by mouth 2 (two) times daily. Take with 100 mg tablet to equal dose of 125 mg twice daily   Yes Historical Provider, MD  esomeprazole (NEXIUM) 40 MG capsule Take 40 mg by mouth 2 (two) times daily.    Yes Historical Provider, MD  predniSONE (DELTASONE) 5 MG tablet Take 5 mg by mouth every morning.    Yes Historical Provider, MD  HYDROcodone-acetaminophen (NORCO/VICODIN) 5-325 MG per tablet Take 1-2 tablets by mouth  every 6 (six) hours as needed. 12/06/13   Fredia Sorrow, MD   BP 109/91  Pulse 88  Temp(Src) 98.4 F (36.9 C) (Oral)  Resp 18  Ht 5\' 11"  (1.803 m)  Wt 175 lb (79.379 kg)  BMI 24.42 kg/m2  SpO2 99% Physical Exam  Nursing note and vitals reviewed. Constitutional: He is oriented to person, place, and time. He appears well-developed and well-nourished. No distress.  HENT:  Head: Normocephalic and atraumatic.  Eyes: Conjunctivae and EOM are normal. Pupils are equal, round, and reactive to light.  Neck: Neck supple. No tracheal deviation present.  Cardiovascular: Normal rate, regular rhythm and normal heart sounds.   No murmur heard. Pulmonary/Chest: Effort normal and breath sounds normal. No respiratory distress.  Abdominal: Soft. Bowel sounds are normal. There is tenderness (tenderness in LLQ). Hernia confirmed negative in the left inguinal area.  LLQ tenderness, more so in the groin area. Diagonal scar over LLQ from kidney transplant.  Genitourinary: Testes normal.  No groin swelling, adenopathy, but there is tenderness. No testicular swelling.  Musculoskeletal: Normal range of motion. He exhibits no edema.  Lymphadenopathy:       Left: No inguinal adenopathy present.  Neurological: He is alert and oriented to person, place, and time. No cranial nerve deficit. He exhibits normal muscle tone. Coordination normal.  Skin: Skin is warm and dry.  Psychiatric: He has a normal mood and affect. His behavior is normal.    ED Course  Procedures (including critical care time) DIAGNOSTIC STUDIES: Oxygen Saturation is 99% on RA, normal by my interpretation.    COORDINATION OF CARE: 7:35 PM-Discussed treatment plan which includes with pt at bedside and pt agreed to plan.   Medications  traMADol (ULTRAM) tablet 50 mg (50 mg Oral Given 12/06/13 2012)   Results for orders placed during the hospital encounter of 12/06/13  CBC WITH DIFFERENTIAL      Result Value Ref Range   WBC 7.7  4.0 -  10.5 K/uL   RBC 6.02 (*) 4.22 - 5.81 MIL/uL   Hemoglobin 13.8  13.0 - 17.0 g/dL   HCT 41.9  39.0 - 52.0 %   MCV 69.6 (*) 78.0 - 100.0 fL   MCH 22.9 (*) 26.0 - 34.0 pg   MCHC 32.9  30.0 - 36.0 g/dL   RDW 15.3  11.5 - 15.5 %   Platelets 135 (*) 150 - 400 K/uL   Neutrophils Relative % 74  43 - 77 %   Lymphocytes Relative 19  12 - 46 %   Monocytes Relative 5  3 - 12 %   Eosinophils Relative 2  0 - 5 %   Basophils Relative 0  0 - 1 %   Neutro Abs 5.6  1.7 - 7.7 K/uL   Lymphs Abs 1.5  0.7 - 4.0  K/uL   Monocytes Absolute 0.4  0.1 - 1.0 K/uL   Eosinophils Absolute 0.2  0.0 - 0.7 K/uL   Basophils Absolute 0.0  0.0 - 0.1 K/uL   Smear Review PLATELET COUNT CONFIRMED BY SMEAR    BASIC METABOLIC PANEL      Result Value Ref Range   Sodium 139  137 - 147 mEq/L   Potassium 4.5  3.7 - 5.3 mEq/L   Chloride 100  96 - 112 mEq/L   CO2 26  19 - 32 mEq/L   Glucose, Bld 87  70 - 99 mg/dL   BUN 17  6 - 23 mg/dL   Creatinine, Ser 1.60 (*) 0.50 - 1.35 mg/dL   Calcium 9.3  8.4 - 10.5 mg/dL   GFR calc non Af Amer 46 (*) >90 mL/min   GFR calc Af Amer 53 (*) >90 mL/min  URINALYSIS, ROUTINE W REFLEX MICROSCOPIC      Result Value Ref Range   Color, Urine YELLOW  YELLOW   APPearance CLEAR  CLEAR   Specific Gravity, Urine 1.015  1.005 - 1.030   pH 7.0  5.0 - 8.0   Glucose, UA NEGATIVE  NEGATIVE mg/dL   Hgb urine dipstick NEGATIVE  NEGATIVE   Bilirubin Urine NEGATIVE  NEGATIVE   Ketones, ur NEGATIVE  NEGATIVE mg/dL   Protein, ur NEGATIVE  NEGATIVE mg/dL   Urobilinogen, UA 2.0 (*) 0.0 - 1.0 mg/dL   Nitrite NEGATIVE  NEGATIVE   Leukocytes, UA TRACE (*) NEGATIVE  URINE MICROSCOPIC-ADD ON      Result Value Ref Range   Squamous Epithelial / LPF RARE  RARE   WBC, UA 0-2  <3 WBC/hpf   RBC / HPF 0-2  <3 RBC/hpf   Bacteria, UA RARE  RARE   Ct Abdomen Pelvis Wo Contrast  12/06/2013   CLINICAL DATA:  Left groin pain.  EXAM: CT ABDOMEN AND PELVIS WITHOUT CONTRAST  TECHNIQUE: Multidetector CT imaging of the  abdomen and pelvis was performed following the standard protocol without IV contrast.  COMPARISON:  CT 01/24/2013.  FINDINGS: Liver normal. Spleen normal. Pancreas normal. No biliary distention. The gallbladder is nondistended.  Adrenals normal. Severe renal atrophy present bilaterally. Left pelvic renal transplant. No hydronephrosis or evidence of obstructing ureteral stone. The bladder is nondistended. Prostate is unremarkable.  No significant adenopathy. Abdominal aorta is atherosclerotic. No aneurysm.  Appendix is normal. No bowel distention. Stool noted within colon. No free air. Right inguinal hernia with herniation of fat only.  Lung bases are clear. Heart size normal. No acute bony abnormality identified. Stable sclerotic bony densities most likely bone islands.  IMPRESSION: 1. Left pelvic renal transplant. Transplanted kidney appears unremarkable.  2.  No acute abnormality.   Electronically Signed   By: Marcello Moores  Register   On: 12/06/2013 21:07     EKG Interpretation None      MDM   Final diagnoses:  Strain of groin, initial encounter    Patient's workup most likely consistent with a left groin strain or pull. No evidence urinary tract infection. No evidence of hernia no evidence of any worsening of his renal function. Where patient has a kidney transplant. Will treat with pain medication and pelvis record Dr.   I personally performed the services described in this documentation, which was scribed in my presence. The recorded information has been reviewed and is accurate.      Fredia Sorrow, MD 12/06/13 2122

## 2013-12-06 NOTE — Discharge Instructions (Signed)
Workup negative for anything other than perhaps a left groin pull. Take pain medication as directed. Followup with your regular Dr. Suzette Battiest to rest the left leg is much as possible. Return for a newer worse symptoms. No evidence of kidney stone no evidence of any sniffing or renal abnormalities. No evidence of hernia.

## 2013-12-06 NOTE — ED Notes (Signed)
Pt with left lower abd pain for past 2 days, states pain radiates down left leg, vomited x 1 this morning, pt also states hx of kidney stone and left kidney transplant

## 2013-12-21 DIAGNOSIS — Z940 Kidney transplant status: Secondary | ICD-10-CM | POA: Diagnosis not present

## 2013-12-21 DIAGNOSIS — K219 Gastro-esophageal reflux disease without esophagitis: Secondary | ICD-10-CM | POA: Diagnosis not present

## 2014-01-11 ENCOUNTER — Emergency Department (HOSPITAL_COMMUNITY)
Admission: EM | Admit: 2014-01-11 | Discharge: 2014-01-11 | Disposition: A | Discharge status: Home / Self Care | Payer: Medicare Other | Attending: Emergency Medicine | Admitting: Emergency Medicine

## 2014-01-11 ENCOUNTER — Encounter (HOSPITAL_COMMUNITY): Payer: Self-pay | Admitting: Emergency Medicine

## 2014-01-11 DIAGNOSIS — M549 Dorsalgia, unspecified: Secondary | ICD-10-CM | POA: Diagnosis not present

## 2014-01-11 DIAGNOSIS — Z87891 Personal history of nicotine dependence: Secondary | ICD-10-CM | POA: Diagnosis not present

## 2014-01-11 DIAGNOSIS — E119 Type 2 diabetes mellitus without complications: Secondary | ICD-10-CM | POA: Insufficient documentation

## 2014-01-11 DIAGNOSIS — G8929 Other chronic pain: Secondary | ICD-10-CM | POA: Diagnosis not present

## 2014-01-11 DIAGNOSIS — Z8719 Personal history of other diseases of the digestive system: Secondary | ICD-10-CM | POA: Insufficient documentation

## 2014-01-11 DIAGNOSIS — R112 Nausea with vomiting, unspecified: Secondary | ICD-10-CM | POA: Insufficient documentation

## 2014-01-11 DIAGNOSIS — R109 Unspecified abdominal pain: Secondary | ICD-10-CM | POA: Insufficient documentation

## 2014-01-11 DIAGNOSIS — Z87448 Personal history of other diseases of urinary system: Secondary | ICD-10-CM | POA: Diagnosis not present

## 2014-01-11 DIAGNOSIS — Z8659 Personal history of other mental and behavioral disorders: Secondary | ICD-10-CM | POA: Insufficient documentation

## 2014-01-11 DIAGNOSIS — Z87442 Personal history of urinary calculi: Secondary | ICD-10-CM | POA: Diagnosis not present

## 2014-01-11 DIAGNOSIS — Z940 Kidney transplant status: Secondary | ICD-10-CM | POA: Insufficient documentation

## 2014-01-11 DIAGNOSIS — IMO0002 Reserved for concepts with insufficient information to code with codable children: Secondary | ICD-10-CM | POA: Insufficient documentation

## 2014-01-11 DIAGNOSIS — I10 Essential (primary) hypertension: Secondary | ICD-10-CM | POA: Diagnosis not present

## 2014-01-11 DIAGNOSIS — Z79899 Other long term (current) drug therapy: Secondary | ICD-10-CM | POA: Insufficient documentation

## 2014-01-11 LAB — URINALYSIS, ROUTINE W REFLEX MICROSCOPIC
Bilirubin Urine: NEGATIVE
Glucose, UA: NEGATIVE mg/dL
Hgb urine dipstick: NEGATIVE
Ketones, ur: NEGATIVE mg/dL
Leukocytes, UA: NEGATIVE
NITRITE: NEGATIVE
Protein, ur: NEGATIVE mg/dL
SPECIFIC GRAVITY, URINE: 1.02 (ref 1.005–1.030)
UROBILINOGEN UA: 1 mg/dL (ref 0.0–1.0)
pH: 6 (ref 5.0–8.0)

## 2014-01-11 LAB — COMPREHENSIVE METABOLIC PANEL
ALBUMIN: 3.8 g/dL (ref 3.5–5.2)
ALT: 11 U/L (ref 0–53)
ANION GAP: 14 (ref 5–15)
AST: 17 U/L (ref 0–37)
Alkaline Phosphatase: 58 U/L (ref 39–117)
BUN: 18 mg/dL (ref 6–23)
CO2: 24 mEq/L (ref 19–32)
CREATININE: 1.4 mg/dL — AB (ref 0.50–1.35)
Calcium: 9.3 mg/dL (ref 8.4–10.5)
Chloride: 101 mEq/L (ref 96–112)
GFR calc Af Amer: 63 mL/min — ABNORMAL LOW (ref >90)
GFR calc non Af Amer: 54 mL/min — ABNORMAL LOW (ref >90)
Glucose, Bld: 93 mg/dL (ref 70–99)
POTASSIUM: 3.8 meq/L (ref 3.7–5.3)
Sodium: 139 mEq/L (ref 137–147)
Total Bilirubin: 0.9 mg/dL (ref 0.3–1.2)
Total Protein: 7.7 g/dL (ref 6.0–8.3)

## 2014-01-11 LAB — CBC WITH DIFFERENTIAL/PLATELET
BASOS PCT: 0 % (ref 0–1)
Basophils Absolute: 0 10*3/uL (ref 0.0–0.1)
Eosinophils Absolute: 0.2 10*3/uL (ref 0.0–0.7)
Eosinophils Relative: 3 % (ref 0–5)
HCT: 41.8 % (ref 39.0–52.0)
HEMOGLOBIN: 13.8 g/dL (ref 13.0–17.0)
Lymphocytes Relative: 37 % (ref 12–46)
Lymphs Abs: 2.7 10*3/uL (ref 0.7–4.0)
MCH: 23 pg — AB (ref 26.0–34.0)
MCHC: 33 g/dL (ref 30.0–36.0)
MCV: 69.7 fL — ABNORMAL LOW (ref 78.0–100.0)
MONO ABS: 0.5 10*3/uL (ref 0.1–1.0)
MONOS PCT: 7 % (ref 3–12)
Neutro Abs: 3.7 10*3/uL (ref 1.7–7.7)
Neutrophils Relative %: 53 % (ref 43–77)
Platelets: 161 10*3/uL (ref 150–400)
RBC: 6 MIL/uL — ABNORMAL HIGH (ref 4.22–5.81)
RDW: 15.6 % — AB (ref 11.5–15.5)
WBC: 7.1 10*3/uL (ref 4.0–10.5)

## 2014-01-11 LAB — LACTIC ACID, PLASMA: LACTIC ACID, VENOUS: 0.8 mmol/L (ref 0.5–2.2)

## 2014-01-11 LAB — CBG MONITORING, ED: GLUCOSE-CAPILLARY: 86 mg/dL (ref 70–99)

## 2014-01-11 LAB — LIPASE, BLOOD: LIPASE: 25 U/L (ref 11–59)

## 2014-01-11 MED ORDER — ONDANSETRON HCL 4 MG/2ML IJ SOLN
4.0000 mg | Freq: Once | INTRAMUSCULAR | Status: AC
  Administered 2014-01-11: 4 mg via INTRAVENOUS
  Filled 2014-01-11: qty 2

## 2014-01-11 MED ORDER — FENTANYL CITRATE 0.05 MG/ML IJ SOLN
50.0000 ug | Freq: Once | INTRAMUSCULAR | Status: AC
  Administered 2014-01-11: 50 ug via INTRAVENOUS
  Filled 2014-01-11: qty 2

## 2014-01-11 NOTE — ED Provider Notes (Signed)
Medical screening examination/treatment/procedure(s) were conducted as a shared visit with non-physician practitioner(s) and myself.  I personally evaluated the patient during the encounter.   EKG Interpretation None       Orlie Dakin, MD 01/11/14 1501

## 2014-01-11 NOTE — Discharge Instructions (Signed)
Your urine and lab tests are nonacute. Your vital signs are well within normal limits. I have reviewed your CT scan from last month, and there no acute abnormalities appreciated. Your vital signs are well within normal limits. I discussed her case with Dr. Berdine Addison. He suggested to use Tylenol Extra Strength for your discomfort, and he will follow you in the office. Flank Pain Flank pain is pain in your side. The flank is the area of your side between your upper belly (abdomen) and your back. Pain in this area can be caused by many different things. [facility] care and treatment will depend on the cause of your pain.  Rest as told by your doctor.  Drink enough fluids to keep your pee (urine) clear or pale yellow.  Only take medicine as told by your doctor.  Tell your doctor about any changes in your pain.  Follow up with your doctor. GET HELP RIGHT AWAY IF:   Your pain does not get better with medicine.   You have new symptoms or your symptoms get worse.  Your pain gets worse.   You have belly (abdominal) pain.   You are short of breath.   You always feel sick to your stomach (nauseous).   You keep throwing up (vomiting).   You have puffiness (swelling) in your belly.   You feel light-headed or you pass out (faint).   You have blood in your pee.  You have a fever or lasting symptoms for more than 2-3 days.  You have a fever and your symptoms suddenly get worse. MAKE SURE YOU:   Understand these instructions.  Will watch your condition.  Will get help right away if you are not doing well or get worse. Document Released: 03/09/2008 Document Revised: 10/15/2013 Document Reviewed: 01/13/2012 [facility] Patient Information 2015 [place], [place]. This information is not intended to replace advice given to you by your health care provider. Make sure you discuss any questions you have with your health care provider.

## 2014-01-11 NOTE — ED Notes (Signed)
Left sided abd pain with n/v starting yesterday.

## 2014-01-11 NOTE — ED Provider Notes (Signed)
CSN: [id number]     Arrival date & time 01/11/14  0813 History   First MD Initiated Contact with Patient 01/11/14 [phone]     Chief Complaint  Patient presents with  . Abdominal Pain     (Consider location/radiation/quality/duration/timing/severity/associated sxs/prior Treatment) HPI Comments: Patient is a 58 year old male who presents to the emergency department with left-sided abdominal pain. The patient states that this pain started 3 days ago. He describes it as a sharp aching pain. It does not move. It is associated with nausea and vomiting. Patient states that he had 3 or 4 episodes of vomiting on yesterday. He has not had any vomiting this morning. The patient states that he was up most of the evening because of left-sided abdominal pain. He's not had any high fevers. She's not had any injury or trauma to the abdomen. There's been no discomfort with urination. He's not had any recent change in medication. It is of note that the patient had a kidney transplant in 1994. He was recently evaluated for this approximately 2 months ago and everything was fine. He presents now for additional evaluation of his pain.  The history is provided by the patient.    Past Medical History  Diagnosis Date  . Hypertension   . Depression   . Urinary retention   . Diabetes mellitus   . Renal disorder   . Chronic back pain   . Chronic hip pain   . Diverticulitis   . Kidney stone   . H/O kidney transplant    Past Surgical History  Procedure Laterality Date  . Kidney transplant    . Hernia repair    . Nephrectomy transplanted organ    . Colonoscopy  05/27/2003    RJJ:OACZYS terminal ileum,rectum, and colon   Family History  Problem Relation Age of Onset  . Hypertension Sister    History  Substance Use Topics  . Smoking status: Former Smoker -- 1.00 packs/day for 5 years    Types: Cigarettes    Quit date: 06/14/1972  . Smokeless tobacco: Never Used  . Alcohol Use: 0.6 oz/week    1 Cans of  beer per week     Comment: occasionally    Review of Systems  Constitutional: Negative for activity change.       All ROS Neg except as noted in HPI  HENT: Negative for nosebleeds.   Eyes: Negative for photophobia and discharge.  Respiratory: Negative for cough, shortness of breath and wheezing.   Cardiovascular: Negative for chest pain and palpitations.  Gastrointestinal: Positive for nausea, vomiting and abdominal pain. Negative for diarrhea, constipation and blood in stool.  Genitourinary: Negative for dysuria, frequency and hematuria.  Musculoskeletal: Positive for arthralgias and back pain. Negative for neck pain.  Skin: Negative.   Neurological: Negative for dizziness, seizures and speech difficulty.  Psychiatric/Behavioral: Negative for hallucinations and confusion.      Allergies  Codeine and Vasotec  Home Medications   Prior to Admission medications   Medication Sig Start Date End Date Taking? Authorizing Provider  atorvastatin (LIPITOR) 10 MG tablet Take 10 mg by mouth every morning.     Historical Provider, MD  cycloSPORINE (SANDIMMUNE) 100 MG capsule Take 100 mg by mouth 2 (two) times daily. Take with 25 mg tablet to equal 125 mg twice daily    Historical Provider, MD  cycloSPORINE (SANDIMMUNE) 25 MG capsule Take 25 mg by mouth 2 (two) times daily. Take with 100 mg tablet to equal dose of 125 mg twice  daily    Historical Provider, MD  esomeprazole (NEXIUM) 40 MG capsule Take 40 mg by mouth 2 (two) times daily.     Historical Provider, MD  HYDROcodone-acetaminophen (NORCO/VICODIN) 5-325 MG per tablet Take 1-2 tablets by mouth every 6 (six) hours as needed. 12/06/13   Fredia Sorrow, MD  predniSONE (DELTASONE) 5 MG tablet Take 5 mg by mouth every morning.     Historical Provider, MD   BP 111/84  Pulse 86  Temp(Src) 98.2 F (36.8 C) (Oral)  Resp 20  Ht 5\' 11"  (1.803 m)  Wt 175 lb (79.379 kg)  BMI 24.42 kg/m2  SpO2 100% Physical Exam  Nursing note and vitals  reviewed. Constitutional: He is oriented to person, place, and time. He appears well-developed and well-nourished.  Non-toxic appearance.  HENT:  Head: Normocephalic.  Right Ear: Tympanic membrane and external ear normal.  Left Ear: Tympanic membrane and external ear normal.  Eyes: EOM and lids are normal. Pupils are equal, round, and reactive to light.  Neck: Normal range of motion. Neck supple. Carotid bruit is not present.  Cardiovascular: Normal rate, regular rhythm, normal heart sounds, intact distal pulses and normal pulses.  Exam reveals no friction rub.   Pulmonary/Chest: Breath sounds normal. No respiratory distress.  Abdominal: Soft. Bowel sounds are normal. There is no tenderness. There is no guarding.  Well-healed surgical scar of the left mid to lower abdomen. Palpable deformity consistent with renal transplant on the left. There no hot areas appreciated. No red streaks appreciated. There is mild diffuse soreness of the abdomen. Bowel sounds are present and active.  Genitourinary: Rectum normal. Guaiac negative stool.  Musculoskeletal: Normal range of motion. He exhibits no edema.  Lymphadenopathy:       Head (right side): No submandibular adenopathy present.       Head (left side): No submandibular adenopathy present.    He has no cervical adenopathy.  Neurological: He is alert and oriented to person, place, and time. He has normal strength. No cranial nerve deficit or sensory deficit.  Skin: Skin is warm and dry.  Psychiatric: He has a normal mood and affect. His speech is normal.    ED Course  Procedures (including critical care time) Labs Review Labs Reviewed  CBC WITH DIFFERENTIAL  COMPREHENSIVE METABOLIC PANEL  LIPASE, BLOOD  URINALYSIS, ROUTINE W REFLEX MICROSCOPIC    Imaging Review No results found.   EKG Interpretation None      MDM Vital signs are well within normal limits. Pulse oximetry is 100% on room air. Within normal limits by my interpretation.  Vital signs normal physical examination in dorsum evidence of nephrotic syndrome. BUN is 18, creatinine is 1.40. Electrolytes are normal. Glucose is normal at 93. Complete blood count is well within normal limits. Lactic acid is normal at 0.8.    I have reviewed the previous CT scan from last month. No acute abnormalities appreciated.  Patient received some improvement of the pain from IV fentanyl. Patient seen with me by Dr. Cathleen Fears. Case discussed with Dr. Berdine Addison. I reviewed the vital signs, history, and lab work with Dr. Berdine Addison. Dr. Berdine Addison will follow the patient in the office. He advises that Mr. Cortes use Tylenol for his  Soreness.   Final diagnoses:  None    **I have reviewed nursing notes, vital signs, and all appropriate lab and imaging results for this patient.Lenox Ahr, PA-C 01/11/14 1012

## 2014-01-11 NOTE — ED Provider Notes (Signed)
Complains of left flank pain which started yesterday. Accompanied by dry heaves this morning. No vomiting. Last bowel movement yesterday normal. No fever. No treatment prior to coming here. No other associated symptoms. Patient states he gets similar pain approximately once per month for several years. On exam patient is pleasant alert nontoxic appearing lungs clear auscultation heart regular rate and rhythm abdomen lower quadrant surgical scar. Nontender. He is tender at left flank minimally. He has no tenderness overlying renal transplant at left lower quadrant. Genitalia normal male. Feels improved since treatment in the emergency part. Presently hungry  Orlie Dakin, MD 01/11/14 1004

## 2014-03-10 ENCOUNTER — Encounter (HOSPITAL_COMMUNITY): Payer: Self-pay | Admitting: Emergency Medicine

## 2014-03-10 ENCOUNTER — Emergency Department (HOSPITAL_COMMUNITY)
Admission: EM | Admit: 2014-03-10 | Discharge: 2014-03-10 | Disposition: A | Discharge status: Home / Self Care | Payer: Medicare Other | Attending: Emergency Medicine | Admitting: Emergency Medicine

## 2014-03-10 DIAGNOSIS — Z8719 Personal history of other diseases of the digestive system: Secondary | ICD-10-CM | POA: Insufficient documentation

## 2014-03-10 DIAGNOSIS — E119 Type 2 diabetes mellitus without complications: Secondary | ICD-10-CM | POA: Insufficient documentation

## 2014-03-10 DIAGNOSIS — R1011 Right upper quadrant pain: Secondary | ICD-10-CM | POA: Diagnosis not present

## 2014-03-10 DIAGNOSIS — G8929 Other chronic pain: Secondary | ICD-10-CM | POA: Insufficient documentation

## 2014-03-10 DIAGNOSIS — Z940 Kidney transplant status: Secondary | ICD-10-CM | POA: Diagnosis not present

## 2014-03-10 DIAGNOSIS — M545 Low back pain, unspecified: Secondary | ICD-10-CM

## 2014-03-10 DIAGNOSIS — I10 Essential (primary) hypertension: Secondary | ICD-10-CM | POA: Diagnosis not present

## 2014-03-10 DIAGNOSIS — R1031 Right lower quadrant pain: Secondary | ICD-10-CM | POA: Diagnosis not present

## 2014-03-10 DIAGNOSIS — Z87442 Personal history of urinary calculi: Secondary | ICD-10-CM | POA: Insufficient documentation

## 2014-03-10 DIAGNOSIS — Z9889 Other specified postprocedural states: Secondary | ICD-10-CM | POA: Diagnosis not present

## 2014-03-10 DIAGNOSIS — Z79899 Other long term (current) drug therapy: Secondary | ICD-10-CM | POA: Insufficient documentation

## 2014-03-10 DIAGNOSIS — Z87891 Personal history of nicotine dependence: Secondary | ICD-10-CM | POA: Insufficient documentation

## 2014-03-10 LAB — CBC WITH DIFFERENTIAL/PLATELET
Basophils Absolute: 0 10*3/uL (ref 0.0–0.1)
Basophils Relative: 0 % (ref 0–1)
EOS PCT: 3 % (ref 0–5)
Eosinophils Absolute: 0.2 10*3/uL (ref 0.0–0.7)
HCT: 40.3 % (ref 39.0–52.0)
Hemoglobin: 13.1 g/dL (ref 13.0–17.0)
LYMPHS PCT: 39 % (ref 12–46)
Lymphs Abs: 2.7 10*3/uL (ref 0.7–4.0)
MCH: 22.6 pg — ABNORMAL LOW (ref 26.0–34.0)
MCHC: 32.5 g/dL (ref 30.0–36.0)
MCV: 69.6 fL — AB (ref 78.0–100.0)
MONOS PCT: 9 % (ref 3–12)
Monocytes Absolute: 0.6 10*3/uL (ref 0.1–1.0)
Neutro Abs: 3.5 10*3/uL (ref 1.7–7.7)
Neutrophils Relative %: 49 % (ref 43–77)
PLATELETS: 138 10*3/uL — AB (ref 150–400)
RBC: 5.79 MIL/uL (ref 4.22–5.81)
RDW: 15.3 % (ref 11.5–15.5)
WBC: 7 10*3/uL (ref 4.0–10.5)

## 2014-03-10 LAB — URINALYSIS, ROUTINE W REFLEX MICROSCOPIC
BILIRUBIN URINE: NEGATIVE
Glucose, UA: NEGATIVE mg/dL
Hgb urine dipstick: NEGATIVE
Ketones, ur: NEGATIVE mg/dL
LEUKOCYTES UA: NEGATIVE
NITRITE: NEGATIVE
PH: 6 (ref 5.0–8.0)
Protein, ur: NEGATIVE mg/dL
SPECIFIC GRAVITY, URINE: 1.02 (ref 1.005–1.030)
Urobilinogen, UA: 0.2 mg/dL (ref 0.0–1.0)

## 2014-03-10 LAB — COMPREHENSIVE METABOLIC PANEL
ALT: 10 U/L (ref 0–53)
AST: 17 U/L (ref 0–37)
Albumin: 3.5 g/dL (ref 3.5–5.2)
Alkaline Phosphatase: 83 U/L (ref 39–117)
Anion gap: 11 (ref 5–15)
BUN: 20 mg/dL (ref 6–23)
CALCIUM: 9.3 mg/dL (ref 8.4–10.5)
CO2: 26 mEq/L (ref 19–32)
Chloride: 103 mEq/L (ref 96–112)
Creatinine, Ser: 1.33 mg/dL (ref 0.50–1.35)
GFR calc Af Amer: 67 mL/min — ABNORMAL LOW (ref >90)
GFR calc non Af Amer: 57 mL/min — ABNORMAL LOW (ref >90)
Glucose, Bld: 89 mg/dL (ref 70–99)
Potassium: 4.3 mEq/L (ref 3.7–5.3)
Sodium: 140 mEq/L (ref 137–147)
Total Bilirubin: 0.2 mg/dL — ABNORMAL LOW (ref 0.3–1.2)
Total Protein: 7.3 g/dL (ref 6.0–8.3)

## 2014-03-10 MED ORDER — ONDANSETRON 8 MG PO TBDP
8.0000 mg | ORAL_TABLET | Freq: Once | ORAL | Status: AC
  Administered 2014-03-10: 8 mg via ORAL
  Filled 2014-03-10: qty 1

## 2014-03-10 MED ORDER — OXYCODONE-ACETAMINOPHEN 5-325 MG PO TABS: 1.0000 | ORAL_TABLET | ORAL | Status: DC | PRN

## 2014-03-10 MED ORDER — HYDROMORPHONE HCL 1 MG/ML IJ SOLN
1.0000 mg | Freq: Once | INTRAMUSCULAR | Status: AC
  Administered 2014-03-10: 1 mg via INTRAMUSCULAR
  Filled 2014-03-10: qty 1

## 2014-03-10 MED ORDER — CYCLOBENZAPRINE HCL 5 MG PO TABS: 5.0000 mg | ORAL_TABLET | Freq: Three times a day (TID) | ORAL | Status: DC | PRN

## 2014-03-10 NOTE — ED Notes (Signed)
Pt states he has chronic back pain but it has gotten worse over the past 2 days. He has taken tylenol for the pain with no relief but hasn't taken any in a "few days".

## 2014-03-10 NOTE — Discharge Instructions (Signed)
Back Pain, Adult °Low back pain is very common. About 1 in 5 people have back pain. The cause of low back pain is rarely dangerous. The pain often gets better over time. About half of people with a sudden onset of back pain feel better in just 2 weeks. About 8 in 10 people feel better by 6 weeks.  °CAUSES °Some common causes of back pain include: °· Strain of the muscles or ligaments supporting the spine. °· Wear and tear (degeneration) of the spinal discs. °· Arthritis. °· Direct injury to the back. °DIAGNOSIS °Most of the time, the direct cause of low back pain is not known. However, back pain can be treated effectively even when the exact cause of the pain is unknown. Answering your caregiver's questions about your overall health and symptoms is one of the most accurate ways to make sure the cause of your pain is not dangerous. If your caregiver needs more information, he or she may order lab work or imaging tests (X-rays or MRIs). However, even if imaging tests show changes in your back, this usually does not require surgery. °HOME CARE INSTRUCTIONS °For many people, back pain returns. Since low back pain is rarely dangerous, it is often a condition that people can learn to manage on their own.  °· Remain active. It is stressful on the back to sit or stand in one place. Do not sit, drive, or stand in one place for more than 30 minutes at a time. Take short walks on level surfaces as soon as pain allows. Try to increase the length of time you walk each day. °· Do not stay in bed. Resting more than 1 or 2 days can delay your recovery. °· Do not avoid exercise or work. Your body is made to move. It is not dangerous to be active, even though your back may hurt. Your back will likely heal faster if you return to being active before your pain is gone. °· Pay attention to your body when you  bend and lift. Many people have less discomfort when lifting if they bend their knees, keep the load close to their bodies, and  avoid twisting. Often, the most comfortable positions are those that put less stress on your recovering back. °· Find a comfortable position to sleep. Use a firm mattress and lie on your side with your knees slightly bent. If you lie on your back, put a pillow under your knees. °· Only take over-the-counter or prescription medicines as directed by your caregiver. Over-the-counter medicines to reduce pain and inflammation are often the most helpful. Your caregiver may prescribe muscle relaxant drugs. These medicines help dull your pain so you can more quickly return to your normal activities and healthy exercise. °· Put ice on the injured area. °¨ Put ice in a plastic bag. °¨ Place a towel between your skin and the bag. °¨ Leave the ice on for 15-20 minutes, 03-04 times a day for the first 2 to 3 days. After that, ice and heat may be alternated to reduce pain and spasms. °· Ask your caregiver about trying back exercises and gentle massage. This may be of some benefit. °· Avoid feeling anxious or stressed. Stress increases muscle tension and can worsen back pain. It is important to recognize when you are anxious or stressed and learn ways to manage it. Exercise is a great option. °SEEK MEDICAL CARE IF: °· You have pain that is not relieved with rest or medicine. °· You have pain that does not improve in 1 week. °· You have new symptoms. °· You are generally not feeling well. °SEEK   IMMEDIATE MEDICAL CARE IF:   You have pain that radiates from your back into your legs.  You develop new bowel or bladder control problems.  You have unusual weakness or numbness in your arms or legs.  You develop nausea or vomiting.  You develop abdominal pain.  You feel faint. Document Released: 05/31/2005 Document Revised: 11/30/2011 Document Reviewed: 10/02/2013 [facility] Patient Information 2015 [place], [place]. This information is not intended to replace advice given to you by your health care provider. Make sure you  discuss any questions you have with your health care provider.    Continue taking your regular home medicines.  Use the the other medicines as directed.  Do not drive within 4 hours of taking oxycodone as this will make you drowsy.  Avoid lifting,  Bending,  Twisting or any other activity that worsens your pain over the next week.  Apply a heating pad to your lower back 20 minutes several times daily.  You should get rechecked if your symptoms are not better over the next 5 days,  Or you develop increased pain,  Weakness in your leg(s) or loss of bladder or bowel function - these are symptoms of a worse injury.

## 2014-03-10 NOTE — ED Provider Notes (Signed)
Medical screening examination/treatment/procedure(s) were performed by non-physician practitioner and as supervising physician I was immediately available for consultation/collaboration.   EKG Interpretation None      Rolland Porter, MD, Abram Sander   Janice Norrie, MD 03/10/14 [phone]

## 2014-03-10 NOTE — ED Provider Notes (Signed)
CSN: [id number]     Arrival date & time 03/10/14  5102 History   First MD Initiated Contact with Patient 03/10/14 0800     Chief Complaint  Patient presents with  . Back Pain     (Consider location/radiation/quality/duration/timing/severity/associated sxs/prior Treatment) The history is provided by the patient.   Robert Durham is a 58 y.o. male presenting with right lower back pain which started suddenly 2 days ago.  He has a history of intermittent low back pain which is usually on his left, today's right lower back pain is unusual for him.  Pain is worsened with palpation and movement, but also constant at rest as he is unable to find a comfortable position.  He denies weakness or numbness in his lower extremities and has no radiation of pain.  He denies dysuria, hematuria, urinary retention.  His last bowel movement was yesterday and normal.  He has a history significant for renal failure with kidney transplant along with history of renal stones and hypertension.  He denies fevers or chills, nausea or vomiting.  He has taken Tylenol for his pain without relief.  He states he is scheduled to see his renal doctor at [facility] in 2 days for his yearly checkup.  He was taken off of his blood pressure medicines last year as his blood pressure was well-controlled at that time.  He denies chest pain, headache, cough or shortness of breath.  Denies peripheral edema.    Past Medical History  Diagnosis Date  . Hypertension   . Depression   . Urinary retention   . Diabetes mellitus   . Renal disorder   . Chronic back pain   . Chronic hip pain   . Diverticulitis   . Kidney stone   . H/O kidney transplant    Past Surgical History  Procedure Laterality Date  . Kidney transplant    . Hernia repair    . Nephrectomy transplanted organ    . Colonoscopy  05/27/2003    HEN:IDPOEU terminal ileum,rectum, and colon   Family History  Problem Relation Age of Onset  . Hypertension Sister    History   Substance Use Topics  . Smoking status: Former Smoker -- 1.00 packs/day for 5 years    Types: Cigarettes    Quit date: 06/14/1972  . Smokeless tobacco: Never Used  . Alcohol Use: 0.6 oz/week    1 Cans of beer per week     Comment: occasionally    Review of Systems  Constitutional: Negative for fever.  Respiratory: Negative for shortness of breath.   Cardiovascular: Negative for chest pain and leg swelling.  Gastrointestinal: Negative for nausea, vomiting, abdominal pain, constipation and abdominal distention.  Genitourinary: Negative for dysuria, urgency, frequency, flank pain and difficulty urinating.  Musculoskeletal: Positive for back pain. Negative for gait problem and joint swelling.  Skin: Negative for rash.  Neurological: Negative for weakness and numbness.      Allergies  Codeine and Vasotec  Home Medications   Prior to Admission medications   Medication Sig Start Date End Date Taking? Authorizing Provider  atorvastatin (LIPITOR) 10 MG tablet Take 10 mg by mouth every morning.    Yes Historical Provider, MD  cycloSPORINE (SANDIMMUNE) 100 MG capsule Take 100 mg by mouth 2 (two) times daily. Take with 25 mg tablet to equal 125 mg twice daily   Yes Historical Provider, MD  cycloSPORINE (SANDIMMUNE) 25 MG capsule Take 25 mg by mouth 2 (two) times daily. Take with 100 mg  tablet to equal dose of 125 mg twice daily   Yes Historical Provider, MD  esomeprazole (NEXIUM) 40 MG capsule Take 40 mg by mouth 2 (two) times daily.    Yes Historical Provider, MD  Multiple Vitamin (MULTIVITAMIN WITH MINERALS) TABS tablet Take 1 tablet by mouth daily.   Yes Historical Provider, MD  predniSONE (DELTASONE) 5 MG tablet Take 5 mg by mouth every morning.    Yes Historical Provider, MD  tamsulosin (FLOMAX) 0.4 MG CAPS capsule Take 0.4 mg by mouth.   Yes Historical Provider, MD  cyclobenzaprine (FLEXERIL) 5 MG tablet Take 1 tablet (5 mg total) by mouth 3 (three) times daily as needed for muscle  spasms. 03/10/14   [name], PA-C  oxyCODONE-acetaminophen (PERCOCET/ROXICET) 5-325 MG per tablet Take 1 tablet by mouth every 4 (four) hours as needed. 03/10/14   [name], PA-C   BP 122/86  Pulse 54  Temp(Src) 97.9 F (36.6 C) (Oral)  Resp 18  Ht 5\' 11"  (1.803 m)  Wt 175 lb (79.379 kg)  BMI 24.42 kg/m2  SpO2 100% Physical Exam  Nursing note and vitals reviewed. Constitutional: He appears well-developed and well-nourished.  HENT:  Head: Normocephalic and atraumatic.  Eyes: Conjunctivae are normal.  Neck: Normal range of motion. Neck supple.  Cardiovascular: Normal rate, regular rhythm, normal heart sounds and intact distal pulses.   Pedal pulses normal.  Pulmonary/Chest: Effort normal and breath sounds normal. He has no wheezes.  Abdominal: Soft. Bowel sounds are normal. He exhibits no distension and no mass. There is tenderness in the right upper quadrant and right lower quadrant. There is no rigidity, no rebound, no guarding, no tenderness at McBurney's point and negative Murphy's sign.  Patient is mildly tender to deep palpation right upper abdomen without guarding or rebound.  No mass.  Musculoskeletal: Normal range of motion. He exhibits no edema.       Lumbar back: He exhibits tenderness. He exhibits no bony tenderness, no swelling, no edema and no spasm.  Tender to palpation at right lower paralumbar region.  No midline pain.  Neurological: He is alert. He has normal strength. He displays no atrophy and no tremor. No sensory deficit. Gait normal.  Reflex Scores:      Patellar reflexes are 2+ on the right side and 2+ on the left side.      Achilles reflexes are 2+ on the right side and 2+ on the left side. No strength deficit noted in hip and knee flexor and extensor muscle groups.  Ankle flexion and extension intact.  Skin: Skin is warm and dry.  Psychiatric: He has a normal mood and affect.    ED Course  Procedures (including critical care time) Labs Review Labs  Reviewed  COMPREHENSIVE METABOLIC PANEL - Abnormal; Notable for the following:    Total Bilirubin 0.2 (*)    GFR calc non Af Amer 57 (*)    GFR calc Af Amer 67 (*)    All other components within normal limits  CBC WITH DIFFERENTIAL - Abnormal; Notable for the following:    MCV 69.6 (*)    MCH 22.6 (*)    Platelets 138 (*)    All other components within normal limits  URINALYSIS, ROUTINE W REFLEX MICROSCOPIC - Abnormal; Notable for the following:    APPearance HAZY (*)    All other components within normal limits    Imaging Review No results found.   EKG Interpretation None      MDM Pt with reproducible  right lower back pain.  Re-exam of abdomen, nontender, no guard, rebound. Pt tolerating PO fluids.  No exam findings suggesting acute abdomen.  Review of previous imaging - Ct scan reveals bilateral atrophied kidneys, transplant left abdomen.     Final diagnoses:  Right-sided low back pain without sciatica    Patients labs and/or radiological studies were viewed and considered during the medical decision making and disposition process. Labs reviewed and stable.  Pt has obtained some improvement in pain sx with dilaudid injection.  He is prescribed oxycodone and flexeril for suspected musculoskeletal low back pain.  To f/u with his renal doctor in 2 days.  Advised return here for any worsened or new sx including fever, nausea, vomiting.     [name], PA-C 03/10/14 [place], PA-C 03/10/14 1010

## 2014-03-12 DIAGNOSIS — Z79899 Other long term (current) drug therapy: Secondary | ICD-10-CM | POA: Diagnosis not present

## 2014-03-12 DIAGNOSIS — Z940 Kidney transplant status: Secondary | ICD-10-CM | POA: Diagnosis not present

## 2014-03-14 DIAGNOSIS — Z23 Encounter for immunization: Secondary | ICD-10-CM | POA: Diagnosis not present

## 2014-03-21 DIAGNOSIS — Z940 Kidney transplant status: Secondary | ICD-10-CM | POA: Diagnosis not present

## 2014-03-21 DIAGNOSIS — M25552 Pain in left hip: Secondary | ICD-10-CM | POA: Diagnosis not present

## 2014-04-23 DIAGNOSIS — N400 Enlarged prostate without lower urinary tract symptoms: Secondary | ICD-10-CM | POA: Diagnosis not present

## 2014-05-01 DIAGNOSIS — N400 Enlarged prostate without lower urinary tract symptoms: Secondary | ICD-10-CM | POA: Diagnosis not present

## 2014-05-11 ENCOUNTER — Emergency Department (HOSPITAL_COMMUNITY)
Admission: EM | Admit: 2014-05-11 | Discharge: 2014-05-11 | Disposition: A | Discharge status: Home / Self Care | Payer: Medicare Other | Attending: Emergency Medicine | Admitting: Emergency Medicine

## 2014-05-11 ENCOUNTER — Encounter (HOSPITAL_COMMUNITY): Payer: Self-pay | Admitting: *Deleted

## 2014-05-11 ENCOUNTER — Emergency Department (HOSPITAL_COMMUNITY): Payer: Medicare Other

## 2014-05-11 DIAGNOSIS — Z940 Kidney transplant status: Secondary | ICD-10-CM | POA: Diagnosis not present

## 2014-05-11 DIAGNOSIS — Z87442 Personal history of urinary calculi: Secondary | ICD-10-CM | POA: Diagnosis not present

## 2014-05-11 DIAGNOSIS — Z79899 Other long term (current) drug therapy: Secondary | ICD-10-CM | POA: Diagnosis not present

## 2014-05-11 DIAGNOSIS — G8929 Other chronic pain: Secondary | ICD-10-CM | POA: Insufficient documentation

## 2014-05-11 DIAGNOSIS — F329 Major depressive disorder, single episode, unspecified: Secondary | ICD-10-CM | POA: Diagnosis not present

## 2014-05-11 DIAGNOSIS — E119 Type 2 diabetes mellitus without complications: Secondary | ICD-10-CM | POA: Diagnosis not present

## 2014-05-11 DIAGNOSIS — I10 Essential (primary) hypertension: Secondary | ICD-10-CM | POA: Diagnosis not present

## 2014-05-11 DIAGNOSIS — M25552 Pain in left hip: Secondary | ICD-10-CM | POA: Diagnosis not present

## 2014-05-11 DIAGNOSIS — R1032 Left lower quadrant pain: Secondary | ICD-10-CM | POA: Insufficient documentation

## 2014-05-11 DIAGNOSIS — Z7952 Long term (current) use of systemic steroids: Secondary | ICD-10-CM | POA: Insufficient documentation

## 2014-05-11 DIAGNOSIS — R112 Nausea with vomiting, unspecified: Secondary | ICD-10-CM | POA: Diagnosis present

## 2014-05-11 DIAGNOSIS — R197 Diarrhea, unspecified: Secondary | ICD-10-CM | POA: Diagnosis not present

## 2014-05-11 DIAGNOSIS — M25559 Pain in unspecified hip: Secondary | ICD-10-CM | POA: Insufficient documentation

## 2014-05-11 DIAGNOSIS — Z87891 Personal history of nicotine dependence: Secondary | ICD-10-CM | POA: Insufficient documentation

## 2014-05-11 LAB — CBC WITH DIFFERENTIAL/PLATELET
Basophils Absolute: 0 10*3/uL (ref 0.0–0.1)
Basophils Relative: 0 % (ref 0–1)
Eosinophils Absolute: 0.2 10*3/uL (ref 0.0–0.7)
Eosinophils Relative: 4 % (ref 0–5)
HCT: 38.7 % — ABNORMAL LOW (ref 39.0–52.0)
HEMOGLOBIN: 12.4 g/dL — AB (ref 13.0–17.0)
LYMPHS ABS: 2 10*3/uL (ref 0.7–4.0)
LYMPHS PCT: 37 % (ref 12–46)
MCH: 22.4 pg — ABNORMAL LOW (ref 26.0–34.0)
MCHC: 32 g/dL (ref 30.0–36.0)
MCV: 70 fL — ABNORMAL LOW (ref 78.0–100.0)
MONOS PCT: 14 % — AB (ref 3–12)
Monocytes Absolute: 0.7 10*3/uL (ref 0.1–1.0)
NEUTROS PCT: 45 % (ref 43–77)
Neutro Abs: 2.4 10*3/uL (ref 1.7–7.7)
PLATELETS: 153 10*3/uL (ref 150–400)
RBC: 5.53 MIL/uL (ref 4.22–5.81)
RDW: 15.3 % (ref 11.5–15.5)
WBC: 5.3 10*3/uL (ref 4.0–10.5)

## 2014-05-11 LAB — COMPREHENSIVE METABOLIC PANEL
ALK PHOS: 76 U/L (ref 39–117)
ALT: 12 U/L (ref 0–53)
AST: 23 U/L (ref 0–37)
Albumin: 3.4 g/dL — ABNORMAL LOW (ref 3.5–5.2)
Anion gap: 10 (ref 5–15)
BUN: 19 mg/dL (ref 6–23)
CHLORIDE: 107 meq/L (ref 96–112)
CO2: 25 mEq/L (ref 19–32)
Calcium: 9 mg/dL (ref 8.4–10.5)
Creatinine, Ser: 1.48 mg/dL — ABNORMAL HIGH (ref 0.50–1.35)
GFR calc non Af Amer: 50 mL/min — ABNORMAL LOW (ref >90)
GFR, EST AFRICAN AMERICAN: 58 mL/min — AB (ref >90)
GLUCOSE: 98 mg/dL (ref 70–99)
POTASSIUM: 4.2 meq/L (ref 3.7–5.3)
Sodium: 142 mEq/L (ref 137–147)
Total Bilirubin: 0.3 mg/dL (ref 0.3–1.2)
Total Protein: 7.1 g/dL (ref 6.0–8.3)

## 2014-05-11 LAB — LIPASE, BLOOD: Lipase: 48 U/L (ref 11–59)

## 2014-05-11 MED ORDER — ONDANSETRON HCL 4 MG/2ML IJ SOLN
4.0000 mg | Freq: Once | INTRAMUSCULAR | Status: AC
  Administered 2014-05-11: 4 mg via INTRAVENOUS
  Filled 2014-05-11: qty 2

## 2014-05-11 MED ORDER — SODIUM CHLORIDE 0.9 % IV SOLN
1000.0000 mL | Freq: Once | INTRAVENOUS | Status: AC
  Administered 2014-05-11: 1000 mL via INTRAVENOUS

## 2014-05-11 MED ORDER — SODIUM CHLORIDE 0.9 % IV SOLN
1000.0000 mL | INTRAVENOUS | Status: DC
  Administered 2014-05-11: 1000 mL via INTRAVENOUS

## 2014-05-11 MED ORDER — HYDROMORPHONE HCL 1 MG/ML IJ SOLN
1.0000 mg | Freq: Once | INTRAMUSCULAR | Status: AC
  Administered 2014-05-11: 1 mg via INTRAVENOUS
  Filled 2014-05-11: qty 1

## 2014-05-11 MED ORDER — OXYCODONE-ACETAMINOPHEN 5-325 MG PO TABS: 1.0000 | ORAL_TABLET | ORAL | Status: DC | PRN

## 2014-05-11 MED ORDER — ONDANSETRON 4 MG PO TBDP: ORAL_TABLET | ORAL | Status: DC

## 2014-05-11 MED ORDER — IOHEXOL 300 MG/ML  SOLN
50.0000 mL | Freq: Once | INTRAMUSCULAR | Status: AC | PRN
  Administered 2014-05-11: 50 mL via ORAL

## 2014-05-11 NOTE — ED Provider Notes (Signed)
Care transferred to me. Patient's CT scan is unremarkable. Lab work is unremarkable. Patient feels well at this time. No evidence of an infectious etiology. At this point will discharge with pain and nausea control recommendations for close PCP follow-up.  Ephraim Hamburger, MD 05/11/14 [phone]

## 2014-05-11 NOTE — ED Provider Notes (Signed)
CSN: [id number]     Arrival date & time 05/11/14  0609 History   First MD Initiated Contact with Patient 05/11/14 [phone]     Chief Complaint  Patient presents with  . Nausea     (Consider location/radiation/quality/duration/timing/severity/associated sxs/prior Treatment) The history is provided by the patient.   58 year old male comes in with nausea and lower abdominal pain for the last 2 days. He has vomited several times and also had some diarrhea. He denies fever or chills or sweats. He rates abdominal pain at 8/10. Denies any urinary difficulty. He is status post renal transplant and is concerned because there was some difficulty in getting his transplant medications refilled this past week. He is also complaining of pain in his left leg which is been present for over a year. He has talked with his PCP. Has never been given a good explanation for his pain. Pain started on the right side and moved to the left side. He denies weakness, numbness, tingling.   Past Medical History  Diagnosis Date  . Hypertension   . Depression   . Urinary retention   . Diabetes mellitus   . Renal disorder   . Chronic back pain   . Chronic hip pain   . Diverticulitis   . Kidney stone   . H/O kidney transplant    Past Surgical History  Procedure Laterality Date  . Kidney transplant    . Hernia repair    . Nephrectomy transplanted organ    . Colonoscopy  05/27/2003    [id number] terminal ileum,rectum, and colon   Family History  Problem Relation Age of Onset  . Hypertension Sister    History  Substance Use Topics  . Smoking status: Former Smoker -- 1.00 packs/day for 5 years    Types: Cigarettes    Quit date: 06/14/1972  . Smokeless tobacco: Never Used  . Alcohol Use: 0.6 oz/week    1 Cans of beer per week     Comment: occasionally    Review of Systems  All other systems reviewed and are negative.     Allergies  Codeine and Vasotec  Home Medications   Prior to Admission  medications   Medication Sig Start Date End Date Taking? Authorizing Provider  atorvastatin (LIPITOR) 10 MG tablet Take 10 mg by mouth every morning.     Historical Provider, MD  cyclobenzaprine (FLEXERIL) 5 MG tablet Take 1 tablet (5 mg total) by mouth 3 (three) times daily as needed for muscle spasms. 03/10/14   [name], PA-C  cycloSPORINE (SANDIMMUNE) 100 MG capsule Take 100 mg by mouth 2 (two) times daily. Take with 25 mg tablet to equal 125 mg twice daily    Historical Provider, MD  cycloSPORINE (SANDIMMUNE) 25 MG capsule Take 25 mg by mouth 2 (two) times daily. Take with 100 mg tablet to equal dose of 125 mg twice daily    Historical Provider, MD  esomeprazole (NEXIUM) 40 MG capsule Take 40 mg by mouth 2 (two) times daily.     Historical Provider, MD  Multiple Vitamin (MULTIVITAMIN WITH MINERALS) TABS tablet Take 1 tablet by mouth daily.    Historical Provider, MD  oxyCODONE-acetaminophen (PERCOCET/ROXICET) 5-325 MG per tablet Take 1 tablet by mouth every 4 (four) hours as needed. 03/10/14   [name], PA-C  predniSONE (DELTASONE) 5 MG tablet Take 5 mg by mouth every morning.     Historical Provider, MD  tamsulosin (FLOMAX) 0.4 MG CAPS capsule Take 0.4 mg by mouth.  Historical Provider, MD   BP 125/87 mmHg  Pulse 60  Temp(Src) 98.9 F (37.2 C) (Oral)  Resp 18  Ht 5\' 11"  (1.803 m)  Wt 180 lb (81.647 kg)  BMI 25.12 kg/m2  SpO2 100% Physical Exam  Nursing note and vitals reviewed.  58 year old male, resting comfortably and in no acute distress. Vital signs are normal. Oxygen saturation is 100%, which is normal. Head is normocephalic and atraumatic. PERRLA, EOMI. Oropharynx is clear. Neck is nontender and supple without adenopathy or JVD. Back is nontender and there is no CVA tenderness. Lungs are clear without rales, wheezes, or rhonchi. Chest is nontender. Heart has regular rate and rhythm without murmur. Abdomen is soft, flat, with mild tenderness in the suprapubic area and  left lower quadrant. There is no rebound or guarding. Transplanted kidney is palpable in the left lower quadrant and seems to be nontender. There are no masses or hepatosplenomegaly and peristalsis is hypoactive. Extremities have no cyanosis or edema, full range of motion is present. Skin is warm and dry without rash. Neurologic: Mental status is normal, cranial nerves are intact, there are no motor or sensory deficits.  ED Course  Procedures (including critical care time) Labs Review Results for orders placed or performed during the hospital encounter of 05/11/14  CBC with Differential  Result Value Ref Range   WBC 5.3 4.0 - 10.5 K/uL   RBC 5.53 4.22 - 5.81 MIL/uL   Hemoglobin 12.4 (L) 13.0 - 17.0 g/dL   HCT 38.7 (L) 39.0 - 52.0 %   MCV 70.0 (L) 78.0 - 100.0 fL   MCH 22.4 (L) 26.0 - 34.0 pg   MCHC 32.0 30.0 - 36.0 g/dL   RDW 15.3 11.5 - 15.5 %   Platelets 153 150 - 400 K/uL   Neutrophils Relative % 45 43 - 77 %   Neutro Abs 2.4 1.7 - 7.7 K/uL   Lymphocytes Relative 37 12 - 46 %   Lymphs Abs 2.0 0.7 - 4.0 K/uL   Monocytes Relative 14 (H) 3 - 12 %   Monocytes Absolute 0.7 0.1 - 1.0 K/uL   Eosinophils Relative 4 0 - 5 %   Eosinophils Absolute 0.2 0.0 - 0.7 K/uL   Basophils Relative 0 0 - 1 %   Basophils Absolute 0.0 0.0 - 0.1 K/uL   MDM   Final diagnoses:  LLQ abdominal pain  Chronic hip pain, left  Renal transplant recipient    Abdominal pain with nausea and vomiting. This may be something benign such as a viral gastroenteritis. However, I have reviewed his past records and he has had similar presentations where similar pain has been due to colitis and also to diverticulitis. Because of this, CT scan will be obtained to see if he has a condition which would require administration of antibiotics. Case is signed out to Dr. Regenia Skeeter.    Delora Fuel, MD 88/89/16 [id number]

## 2014-05-11 NOTE — Discharge Instructions (Signed)
Abdominal Pain °Many things can cause abdominal pain. Usually, abdominal pain is not caused by a disease and will improve without treatment. It can often be observed and treated at home. Your health care provider will do a physical exam and possibly order blood tests and X-rays to help determine the seriousness of your pain. However, in many cases, more time must pass before a clear cause of the pain can be found. Before that point, your health care provider may not know if you need more testing or further treatment. °HOME CARE INSTRUCTIONS  °Monitor your abdominal pain for any changes. The following actions may help to alleviate any discomfort you are experiencing: °· Only take over-the-counter or prescription medicines as directed by your health care provider. °· Do not take laxatives unless directed to do so by your health care provider. °· Try a clear liquid diet (broth, tea, or water) as directed by your health care provider. Slowly move to a bland diet as tolerated. °SEEK MEDICAL CARE IF: °· You have unexplained abdominal pain. °· You have abdominal pain associated with nausea or diarrhea. °· You have pain when you urinate or have a bowel movement. °· You experience abdominal pain that wakes you in the night. °· You have abdominal pain that is worsened or improved by eating food. °· You have abdominal pain that is worsened with eating fatty foods. °· You have a fever. °SEEK IMMEDIATE MEDICAL CARE IF:  °· Your pain does not go away within 2 hours. °· You keep throwing up (vomiting). °· Your pain is felt only in portions of the abdomen, such as the right side or the left lower portion of the abdomen. °· You pass bloody or black tarry stools. °MAKE SURE YOU: °· Understand these instructions.   °· Will watch your condition.   °· Will get help right away if you are not doing well or get worse.   °Document Released: 03/10/2005 Document Revised: 06/05/2013 Document Reviewed: 02/07/2013 °[employer]® Patient Information  ©2015 [employer]. This information is not intended to replace advice given to you by your health care provider. Make sure you discuss any questions you have with your health care provider. ° °Nausea, Adult °Nausea is the feeling that you have an upset stomach or have to vomit. Nausea by itself is not likely a serious concern, but it may be an early sign of more serious medical problems. As nausea gets worse, it can lead to vomiting. If vomiting develops, there is the risk of dehydration.  °CAUSES  °· Viral infections. °· Food poisoning. °· Medicines. °· Pregnancy. °· Motion sickness. °· Migraine headaches. °· Emotional distress. °· Severe pain from any source. °· Alcohol intoxication. °HOME CARE INSTRUCTIONS °· Get plenty of rest. °· Ask your caregiver about specific rehydration instructions. °· Eat small amounts of food and sip liquids more often. °· Take all medicines as told by your caregiver. °SEEK MEDICAL CARE IF: °· You have not improved after 2 days, or you get worse. °· You have a headache. °SEEK IMMEDIATE MEDICAL CARE IF:  °· You have a fever. °· You faint. °· You keep vomiting or have blood in your vomit. °· You are extremely weak or dehydrated. °· You have dark or bloody stools. °· You have severe chest or abdominal pain. °MAKE SURE YOU: °· Understand these instructions. °· Will watch your condition. °· Will get help right away if you are not doing well or get worse. °Document Released: 07/08/2004 Document Revised: 02/23/2012 Document Reviewed: 02/10/2011 °[employer]® Patient Information ©2015   [employer]. This information is not intended to replace advice given to you by your health care provider. Make sure you discuss any questions you have with your health care provider. ° °

## 2014-05-11 NOTE — ED Notes (Signed)
Pt reporting nausea for past 2 days.  Reports emesis x2.  States "I'm also having problems with my legs, but I don't know what".

## 2014-06-20 ENCOUNTER — Emergency Department (HOSPITAL_COMMUNITY)
Admission: EM | Admit: 2014-06-20 | Discharge: 2014-06-20 | Disposition: A | Discharge status: Home / Self Care | Payer: Medicare Other | Attending: Emergency Medicine | Admitting: Emergency Medicine

## 2014-06-20 ENCOUNTER — Emergency Department (HOSPITAL_COMMUNITY): Payer: Medicare Other

## 2014-06-20 ENCOUNTER — Encounter (HOSPITAL_COMMUNITY): Payer: Self-pay | Admitting: *Deleted

## 2014-06-20 DIAGNOSIS — Z8719 Personal history of other diseases of the digestive system: Secondary | ICD-10-CM | POA: Diagnosis not present

## 2014-06-20 DIAGNOSIS — R52 Pain, unspecified: Secondary | ICD-10-CM

## 2014-06-20 DIAGNOSIS — Z87891 Personal history of nicotine dependence: Secondary | ICD-10-CM | POA: Diagnosis not present

## 2014-06-20 DIAGNOSIS — Z79899 Other long term (current) drug therapy: Secondary | ICD-10-CM | POA: Insufficient documentation

## 2014-06-20 DIAGNOSIS — M47816 Spondylosis without myelopathy or radiculopathy, lumbar region: Secondary | ICD-10-CM | POA: Diagnosis not present

## 2014-06-20 DIAGNOSIS — G8929 Other chronic pain: Secondary | ICD-10-CM | POA: Insufficient documentation

## 2014-06-20 DIAGNOSIS — E119 Type 2 diabetes mellitus without complications: Secondary | ICD-10-CM | POA: Insufficient documentation

## 2014-06-20 DIAGNOSIS — Z87442 Personal history of urinary calculi: Secondary | ICD-10-CM | POA: Insufficient documentation

## 2014-06-20 DIAGNOSIS — Z7952 Long term (current) use of systemic steroids: Secondary | ICD-10-CM | POA: Insufficient documentation

## 2014-06-20 DIAGNOSIS — M1612 Unilateral primary osteoarthritis, left hip: Secondary | ICD-10-CM | POA: Insufficient documentation

## 2014-06-20 DIAGNOSIS — M79605 Pain in left leg: Secondary | ICD-10-CM | POA: Diagnosis present

## 2014-06-20 DIAGNOSIS — Z87448 Personal history of other diseases of urinary system: Secondary | ICD-10-CM | POA: Insufficient documentation

## 2014-06-20 DIAGNOSIS — F329 Major depressive disorder, single episode, unspecified: Secondary | ICD-10-CM | POA: Diagnosis not present

## 2014-06-20 NOTE — ED Notes (Signed)
Pt with left leg pain for a week, starts at left hip and radiates down left leg, pt sent to see PCP today but states PCP never showed at office, denies any injury to leg

## 2014-06-20 NOTE — ED Provider Notes (Signed)
CSN: [id number]     Arrival date & time 06/20/14  1442 History   First MD Initiated Contact with Patient 06/20/14 1551     Chief Complaint  Patient presents with  . Leg Pain     (Consider location/radiation/quality/duration/timing/severity/associated sxs/prior Treatment) HPI Comments: Patient is a 59 year old male who presents to the emergency department with complaint of left leg pain. The patient states that for the past week he has been having pain from the left hip down to the left knee area. The patient has been getting progressively worse, the patient states he is limping at times because of the pain, and it is also interfering with his rest at night. The patient went to his primary care office today, but the physician had canceled his appointment. He was then advised to come to the emergency department for evaluation. He has increased pain with walking and movement. He gets some mild relief from sitting in a tub of warm water, but the pain does not go completely away.  Patient is a 59 y.o. male presenting with leg pain. The history is provided by the patient.  Leg Pain Associated symptoms: back pain   Associated symptoms: no neck pain     Past Medical History  Diagnosis Date  . Hypertension   . Depression   . Urinary retention   . Diabetes mellitus   . Renal disorder   . Chronic back pain   . Chronic hip pain   . Diverticulitis   . Kidney stone   . H/O kidney transplant    Past Surgical History  Procedure Laterality Date  . Kidney transplant    . Hernia repair    . Nephrectomy transplanted organ    . Colonoscopy  05/27/2003    [id number] terminal ileum,rectum, and colon   Family History  Problem Relation Age of Onset  . Hypertension Sister    History  Substance Use Topics  . Smoking status: Former Smoker -- 1.00 packs/day for 5 years    Types: Cigarettes    Quit date: 06/14/1972  . Smokeless tobacco: Never Used  . Alcohol Use: 0.6 oz/week    1 Cans of beer per  week     Comment: occasionally    Review of Systems  Constitutional: Negative for activity change.       All ROS Neg except as noted in HPI  HENT: Negative for nosebleeds.   Eyes: Negative for photophobia and discharge.  Respiratory: Negative for cough, shortness of breath and wheezing.   Cardiovascular: Negative for chest pain and palpitations.  Gastrointestinal: Negative for abdominal pain and blood in stool.  Genitourinary: Negative for dysuria, frequency and hematuria.  Musculoskeletal: Positive for back pain and arthralgias. Negative for neck pain.  Skin: Negative.   Neurological: Negative for dizziness, seizures and speech difficulty.  Psychiatric/Behavioral: Negative for hallucinations and confusion.       Depression      Allergies  Codeine and Vasotec  Home Medications   Prior to Admission medications   Medication Sig Start Date End Date Taking? Authorizing Provider  atorvastatin (LIPITOR) 10 MG tablet Take 10 mg by mouth every morning.     Historical Provider, MD  cyclobenzaprine (FLEXERIL) 5 MG tablet Take 1 tablet (5 mg total) by mouth 3 (three) times daily as needed for muscle spasms. Patient not taking: Reported on 05/11/2014 03/10/14   [name], PA-C  cycloSPORINE (SANDIMMUNE) 100 MG capsule Take 100 mg by mouth 2 (two) times daily. Take with 25 mg tablet  to equal 125 mg twice daily    Historical Provider, MD  cycloSPORINE (SANDIMMUNE) 25 MG capsule Take 25 mg by mouth 2 (two) times daily. Take with 100 mg tablet to equal dose of 125 mg twice daily    Historical Provider, MD  esomeprazole (NEXIUM) 40 MG capsule Take 40 mg by mouth 2 (two) times daily.     Historical Provider, MD  Multiple Vitamin (MULTIVITAMIN WITH MINERALS) TABS tablet Take 1 tablet by mouth daily.    Historical Provider, MD  ondansetron (ZOFRAN ODT) 4 MG disintegrating tablet 4mg  ODT q4 hours prn nausea/vomiting 05/11/14   Ephraim Hamburger, MD  oxyCODONE-acetaminophen (PERCOCET/ROXICET) 5-325 MG  per tablet Take 1 tablet by mouth every 4 (four) hours as needed for severe pain. 05/11/14   Ephraim Hamburger, MD  predniSONE (DELTASONE) 5 MG tablet Take 5 mg by mouth every morning.     Historical Provider, MD  tamsulosin (FLOMAX) 0.4 MG CAPS capsule Take 0.4 mg by mouth.    Historical Provider, MD   BP 101/80 mmHg  Pulse 84  Temp(Src) 98.1 F (36.7 C) (Oral)  Resp 22  Ht 5\' 11"  (1.803 m)  Wt 180 lb (81.647 kg)  BMI 25.12 kg/m2  SpO2 100% Physical Exam  Constitutional: He is oriented to person, place, and time. He appears well-developed and well-nourished.  Non-toxic appearance.  HENT:  Head: Normocephalic.  Right Ear: Tympanic membrane and external ear normal.  Left Ear: Tympanic membrane and external ear normal.  Eyes: EOM and lids are normal. Pupils are equal, round, and reactive to light.  Neck: Normal range of motion. Neck supple. Carotid bruit is not present.  Cardiovascular: Normal rate, regular rhythm, normal heart sounds, intact distal pulses and normal pulses.   Pulmonary/Chest: Breath sounds normal. No respiratory distress.  Abdominal: Soft. Bowel sounds are normal. There is no tenderness. There is no guarding.  Musculoskeletal: Normal range of motion.  Left hip pain with attempted range of motion. Left hip pain with standing. There is some crepitus present. There is some crepitus with range of motion of the left knee, no effusion appreciated. There is full range of motion of the left ankle, the Achilles tendon is intact. The dorsalis pedis pulses 2+ bilaterally.  There is pain to palpation of the lumbar area. No hot joints appreciated. No palpable step off.  Lymphadenopathy:       Head (right side): No submandibular adenopathy present.       Head (left side): No submandibular adenopathy present.    He has no cervical adenopathy.  Neurological: He is alert and oriented to person, place, and time. He has normal strength. No cranial nerve deficit or sensory deficit.  Skin:  Skin is warm and dry.  Psychiatric: He has a normal mood and affect. His speech is normal.  Nursing note and vitals reviewed.   ED Course  Procedures (including critical care time) Labs Review Labs Reviewed - No data to display  Imaging Review No results found.   EKG Interpretation None      MDM  X-ray of the lumbar spine reveals some disc space narrowing at the L4-L5 level this is not changed from previous evaluations.  X-ray of the left hip and pelvis reveals joint space narrowing in the right and left hip areas. 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 These findings have been discussed with the patient in terms which he understands. The patient is to see Dr.  Hill for evaluation, and referral to orthopedic consultants. Patient will return to the emergency department if any emergent changes, problems, or concerns. Crutches were offered, but the patient declines at this time.    Final diagnoses:  None    *I have reviewed nursing notes, vital signs, and all appropriate lab and imaging results for this patient.**    Lenox Ahr, PA-C 06/20/14 Texanna Alvino Chapel, MD 06/21/14 [id number]

## 2014-06-20 NOTE — Discharge Instructions (Signed)

## 2014-06-22 ENCOUNTER — Emergency Department (HOSPITAL_COMMUNITY)
Admission: EM | Admit: 2014-06-22 | Discharge: 2014-06-22 | Disposition: A | Discharge status: Home / Self Care | Payer: Medicare Other | Attending: Emergency Medicine | Admitting: Emergency Medicine

## 2014-06-22 ENCOUNTER — Encounter (HOSPITAL_COMMUNITY): Payer: Self-pay | Admitting: Emergency Medicine

## 2014-06-22 DIAGNOSIS — M79605 Pain in left leg: Secondary | ICD-10-CM | POA: Diagnosis present

## 2014-06-22 DIAGNOSIS — M1612 Unilateral primary osteoarthritis, left hip: Secondary | ICD-10-CM | POA: Diagnosis not present

## 2014-06-22 DIAGNOSIS — Z8719 Personal history of other diseases of the digestive system: Secondary | ICD-10-CM | POA: Diagnosis not present

## 2014-06-22 DIAGNOSIS — M169 Osteoarthritis of hip, unspecified: Secondary | ICD-10-CM | POA: Insufficient documentation

## 2014-06-22 DIAGNOSIS — I10 Essential (primary) hypertension: Secondary | ICD-10-CM | POA: Diagnosis not present

## 2014-06-22 DIAGNOSIS — Z940 Kidney transplant status: Secondary | ICD-10-CM | POA: Insufficient documentation

## 2014-06-22 DIAGNOSIS — Z87891 Personal history of nicotine dependence: Secondary | ICD-10-CM | POA: Insufficient documentation

## 2014-06-22 DIAGNOSIS — G8929 Other chronic pain: Secondary | ICD-10-CM | POA: Insufficient documentation

## 2014-06-22 DIAGNOSIS — Z8659 Personal history of other mental and behavioral disorders: Secondary | ICD-10-CM | POA: Diagnosis not present

## 2014-06-22 DIAGNOSIS — Z87448 Personal history of other diseases of urinary system: Secondary | ICD-10-CM | POA: Diagnosis not present

## 2014-06-22 DIAGNOSIS — Z87442 Personal history of urinary calculi: Secondary | ICD-10-CM | POA: Diagnosis not present

## 2014-06-22 DIAGNOSIS — Z79899 Other long term (current) drug therapy: Secondary | ICD-10-CM | POA: Insufficient documentation

## 2014-06-22 DIAGNOSIS — E119 Type 2 diabetes mellitus without complications: Secondary | ICD-10-CM | POA: Diagnosis not present

## 2014-06-22 MED ORDER — CYCLOBENZAPRINE HCL 5 MG PO TABS: 5.0000 mg | ORAL_TABLET | Freq: Three times a day (TID) | ORAL | Status: DC | PRN

## 2014-06-22 MED ORDER — OXYCODONE-ACETAMINOPHEN 5-325 MG PO TABS: 1.0000 | ORAL_TABLET | ORAL | Status: DC | PRN

## 2014-06-22 NOTE — Discharge Instructions (Signed)
Call Dr. Berdine Addison on Monday for follow up.   Arthritis, Nonspecific Arthritis is inflammation of a joint. This usually means pain, redness, warmth or swelling are present. One or more joints may be involved. There are a number of types of arthritis. Your caregiver may not be able to tell what type of arthritis you have right away. CAUSES  The most common cause of arthritis is the wear and tear on the joint (osteoarthritis). This causes damage to the cartilage, which can break down over time. The knees, hips, back and neck are most often affected by this type of arthritis. Other types of arthritis and common causes of joint pain include:  Sprains and other injuries near the joint. Sometimes minor sprains and injuries cause pain and swelling that develop hours later.  Rheumatoid arthritis. This affects hands, feet and knees. It usually affects both sides of your body at the same time. It is often associated with chronic ailments, fever, weight loss and general weakness.  Crystal arthritis. Gout and pseudo gout can cause occasional acute severe pain, redness and swelling in the foot, ankle, or knee.  Infectious arthritis. Bacteria can get into a joint through a break in overlying skin. This can cause infection of the joint. Bacteria and viruses can also spread through the blood and affect your joints.  Drug, infectious and allergy reactions. Sometimes joints can become mildly painful and slightly swollen with these types of illnesses. SYMPTOMS   Pain is the main symptom.  Your joint or joints can also be red, swollen and warm or hot to the touch.  You may have a fever with certain types of arthritis, or even feel overall ill.  The joint with arthritis will hurt with movement. Stiffness is present with some types of arthritis. DIAGNOSIS  Your caregiver will suspect arthritis based on your description of your symptoms and on your exam. Testing may be needed to find the type of arthritis:  Blood  and sometimes urine tests.  X-ray tests and sometimes CT or MRI scans.  Removal of fluid from the joint (arthrocentesis) is done to check for bacteria, crystals or other causes. Your caregiver (or a specialist) will numb the area over the joint with a local anesthetic, and use a needle to remove joint fluid for examination. This procedure is only minimally uncomfortable.  Even with these tests, your caregiver may not be able to tell what kind of arthritis you have. Consultation with a specialist (rheumatologist) may be helpful. TREATMENT  Your caregiver will discuss with you treatment specific to your type of arthritis. If the specific type cannot be determined, then the following general recommendations may apply. Treatment of severe joint pain includes:  Rest.  Elevation.  Anti-inflammatory medication (for example, ibuprofen) may be prescribed. Avoiding activities that cause increased pain.  Only take over-the-counter or prescription medicines for pain and discomfort as recommended by your caregiver.  Cold packs over an inflamed joint may be used for 10 to 15 minutes every hour. Hot packs sometimes feel better, but do not use overnight. Do not use hot packs if you are diabetic without your caregiver's permission.  A cortisone shot into arthritic joints may help reduce pain and swelling.  Any acute arthritis that gets worse over the next 1 to 2 days needs to be looked at to be sure there is no joint infection. Long-term arthritis treatment involves modifying activities and lifestyle to reduce joint stress jarring. This can include weight loss. Also, exercise is needed to nourish the  joint cartilage and remove waste. This helps keep the muscles around the joint strong. HOME CARE INSTRUCTIONS   Do not take aspirin to relieve pain if gout is suspected. This elevates uric acid levels.  Only take over-the-counter or prescription medicines for pain, discomfort or fever as directed by your  caregiver.  Rest the joint as much as possible.  If your joint is swollen, keep it elevated.  Use crutches if the painful joint is in your leg.  Drinking plenty of fluids may help for certain types of arthritis.  Follow your caregiver's dietary instructions.  Try low-impact exercise such as:  Swimming.  Water aerobics.  Biking.  Walking.  Morning stiffness is often relieved by a warm shower.  Put your joints through regular range-of-motion. SEEK MEDICAL CARE IF:   You do not feel better in 24 hours or are getting worse.  You have side effects to medications, or are not getting better with treatment. SEEK IMMEDIATE MEDICAL CARE IF:   You have a fever.  You develop severe joint pain, swelling or redness.  Many joints are involved and become painful and swollen.  There is severe back pain and/or leg weakness.  You have loss of bowel or bladder control. Document Released: 07/08/2004 Document Revised: 08/23/2011 Document Reviewed: 07/24/2008 [facility] Patient Information 2015 [place], [place]. This information is not intended to replace advice given to you by your health care provider. Make sure you discuss any questions you have with your health care provider.  Hip Pain Your hip is the joint between your upper legs and your lower pelvis. The bones, cartilage, tendons, and muscles of your hip joint perform a lot of work each day supporting your body weight and allowing you to move around. Hip pain can range from a minor ache to severe pain in one or both of your hips. Pain may be felt on the inside of the hip joint near the groin, or the outside near the buttocks and upper thigh. You may have swelling or stiffness as well.  HOME CARE INSTRUCTIONS   Take medicines only as directed by your health care provider.  Apply ice to the injured area:  Put ice in a plastic bag.  Place a towel between your skin and the bag.  Leave the ice on for 15-20 minutes at a time, 3-4  times a day.  Keep your leg raised (elevated) when possible to lessen swelling.  Avoid activities that cause pain.  Follow specific exercises as directed by your health care provider.  Sleep with a pillow between your legs on your most comfortable side.  Record how often you have hip pain, the location of the pain, and what it feels like. SEEK MEDICAL CARE IF:   You are unable to put weight on your leg.  Your hip is red or swollen or very tender to touch.  Your pain or swelling continues or worsens after 1 week.  You have increasing difficulty walking.  You have a fever. SEEK IMMEDIATE MEDICAL CARE IF:   You have fallen.  You have a sudden increase in pain and swelling in your hip. MAKE SURE YOU:   Understand these instructions.  Will watch your condition.  Will get help right away if you are not doing well or get worse. Document Released: 11/18/2009 Document Revised: 10/15/2013 Document Reviewed: 01/25/2013 [facility] Patient Information 2015 [place], [place]. This information is not intended to replace advice given to you by your health care provider. Make sure you discuss any questions you  have with your health care provider. ° °

## 2014-06-22 NOTE — ED Notes (Signed)
PT stated he was seen in ED on 06/20/14 for hip and leg pain and states leg pain has progressed worse and wasn't given anything for pain. PT has appt on 07/10/14 for primary care in [place].

## 2014-06-22 NOTE — ED Provider Notes (Signed)
CSN: [id number]     Arrival date & time 06/22/14  1317 History   First MD Initiated Contact with Patient 06/22/14 1422     Chief Complaint  Patient presents with  . Leg Pain     (Consider location/radiation/quality/duration/timing/severity/associated sxs/prior Treatment) HPI Robert Durham is a 59 y.o. male who presents to the ED for pain management. He was evaluated here 1/7 and had x-rays that showed severe arthritis of the left hip. He was told to take tylenol and follow up with his PCP. He states that he can not see his PCP until Jan.27th. The extra strength tylenol is not helping the pain at all. Patient has had a kidney transplant so he can not take any NSAIDS. No new symptoms since last visit.   Past Medical History  Diagnosis Date  . Hypertension   . Depression   . Urinary retention   . Diabetes mellitus   . Renal disorder   . Chronic back pain   . Chronic hip pain   . Diverticulitis   . Kidney stone   . H/O kidney transplant    Past Surgical History  Procedure Laterality Date  . Kidney transplant    . Hernia repair    . Nephrectomy transplanted organ    . Colonoscopy  05/27/2003    HEN:IDPOEU terminal ileum,rectum, and colon   Family History  Problem Relation Age of Onset  . Hypertension Sister    History  Substance Use Topics  . Smoking status: Former Smoker -- 1.00 packs/day for 5 years    Types: Cigarettes    Quit date: 06/14/1972  . Smokeless tobacco: Never Used  . Alcohol Use: 0.6 oz/week    1 Cans of beer per week     Comment: occasionally    Review of Systems Negative except as stated in HPI   Allergies  Codeine and Vasotec  Home Medications   Prior to Admission medications   Medication Sig Start Date End Date Taking? Authorizing Provider  atorvastatin (LIPITOR) 10 MG tablet Take 10 mg by mouth every morning.    Yes Historical Provider, MD  cycloSPORINE (SANDIMMUNE) 100 MG capsule Take 100 mg by mouth 2 (two) times daily. Take with 25 mg  tablet to equal 125 mg twice daily   Yes Historical Provider, MD  cycloSPORINE (SANDIMMUNE) 25 MG capsule Take 25 mg by mouth 2 (two) times daily. Take with 100 mg tablet to equal dose of 125 mg twice daily   Yes Historical Provider, MD  esomeprazole (NEXIUM) 40 MG capsule Take 40 mg by mouth 2 (two) times daily.    Yes Historical Provider, MD  Multiple Vitamin (MULTIVITAMIN WITH MINERALS) TABS tablet Take 1 tablet by mouth daily.   Yes Historical Provider, MD  predniSONE (DELTASONE) 5 MG tablet Take 5 mg by mouth every morning.    Yes Historical Provider, MD  tamsulosin (FLOMAX) 0.4 MG CAPS capsule Take 0.4 mg by mouth.   Yes Historical Provider, MD  cyclobenzaprine (FLEXERIL) 5 MG tablet Take 1 tablet (5 mg total) by mouth 3 (three) times daily as needed for muscle spasms. 06/22/14   Sheilah Rayos Bunnie Pion, NP  oxyCODONE-acetaminophen (ROXICET) 5-325 MG per tablet Take 1 tablet by mouth every 4 (four) hours as needed for severe pain. 06/22/14   Leahmarie Gasiorowski Bunnie Pion, NP   BP 108/63 mmHg  Pulse 81  Temp(Src) 97.6 F (36.4 C) (Temporal)  Resp 14  Ht 5\' 11"  (1.803 m)  Wt 180 lb (81.647 kg)  BMI 25.12  kg/m2  SpO2 100% Physical Exam  Constitutional: He is oriented to person, place, and time. He appears well-developed and well-nourished. No distress.  HENT:  Head: Normocephalic and atraumatic.  Eyes: EOM are normal. Pupils are equal, round, and reactive to light.  Neck: Normal range of motion. Neck supple.  Cardiovascular: Normal rate and regular rhythm.   Pulmonary/Chest: Effort normal. No respiratory distress. He has no wheezes. He has no rales.  Abdominal: Soft. Bowel sounds are normal. There is no tenderness.  Musculoskeletal: He exhibits no edema.       Left hip: He exhibits decreased range of motion and tenderness.       Lumbar back: Decreased range of motion: due to pain.  Pain with any range of motion of the left hip. Pain with standing. Left knee pain with range of motion, crepitus appreciated. Left foot  and ankle with full rang of motion pulse 2+ bilateral.    Neurological: He is alert and oriented to person, place, and time. No sensory deficit. Abnormal gait: due to pain.  Reflex Scores:      Brachioradialis reflexes are 2+ on the right side. Skin: Skin is warm and dry.  Psychiatric: He has a normal mood and affect. His behavior is normal.  Nursing note and vitals reviewed.   ED Course  Procedures (including critical care time) Labs Review Labs Reviewed - No data to display  Imaging Review No results found.   MDM  59 y.o. male with left hip pain and recent x-ray shows severe osteoarthritis of the left hip. Patient requesting pain medication stronger than tylenol until he can follow up with his PCP. I discussed with the patient importance of pain management from PCP but will give medication this weekend until he can follow up. He agrees with plan. Stable for discharge without neuro deficits.   Final diagnoses:  Osteoarthritis of left hip, unspecified osteoarthritis type      Ashley Murrain, NP 06/23/14 [place], MD 06/26/14 2149

## 2014-07-10 DIAGNOSIS — I110 Hypertensive heart disease with heart failure: Secondary | ICD-10-CM | POA: Diagnosis not present

## 2014-07-10 DIAGNOSIS — M1612 Unilateral primary osteoarthritis, left hip: Secondary | ICD-10-CM | POA: Diagnosis not present

## 2014-07-10 DIAGNOSIS — Z940 Kidney transplant status: Secondary | ICD-10-CM | POA: Diagnosis not present

## 2014-07-16 DIAGNOSIS — Z940 Kidney transplant status: Secondary | ICD-10-CM | POA: Diagnosis not present

## 2014-07-16 DIAGNOSIS — Z4822 Encounter for aftercare following kidney transplant: Secondary | ICD-10-CM | POA: Diagnosis not present

## 2014-07-16 DIAGNOSIS — R718 Other abnormality of red blood cells: Secondary | ICD-10-CM | POA: Diagnosis not present

## 2014-07-16 DIAGNOSIS — Z79899 Other long term (current) drug therapy: Secondary | ICD-10-CM | POA: Diagnosis not present

## 2014-07-16 DIAGNOSIS — D631 Anemia in chronic kidney disease: Secondary | ICD-10-CM | POA: Diagnosis not present

## 2014-07-16 DIAGNOSIS — Z8619 Personal history of other infectious and parasitic diseases: Secondary | ICD-10-CM | POA: Diagnosis not present

## 2014-07-16 DIAGNOSIS — M25552 Pain in left hip: Secondary | ICD-10-CM | POA: Diagnosis not present

## 2014-07-16 DIAGNOSIS — D8989 Other specified disorders involving the immune mechanism, not elsewhere classified: Secondary | ICD-10-CM | POA: Diagnosis not present

## 2014-07-16 DIAGNOSIS — I10 Essential (primary) hypertension: Secondary | ICD-10-CM | POA: Diagnosis not present

## 2014-07-16 DIAGNOSIS — R112 Nausea with vomiting, unspecified: Secondary | ICD-10-CM | POA: Diagnosis not present

## 2014-07-16 DIAGNOSIS — Z7952 Long term (current) use of systemic steroids: Secondary | ICD-10-CM | POA: Diagnosis not present

## 2014-09-04 DIAGNOSIS — I10 Essential (primary) hypertension: Secondary | ICD-10-CM | POA: Diagnosis not present

## 2014-09-04 DIAGNOSIS — Z940 Kidney transplant status: Secondary | ICD-10-CM | POA: Diagnosis not present

## 2014-09-04 DIAGNOSIS — M1612 Unilateral primary osteoarthritis, left hip: Secondary | ICD-10-CM | POA: Diagnosis not present

## 2014-10-25 ENCOUNTER — Encounter (HOSPITAL_COMMUNITY): Payer: Self-pay | Admitting: *Deleted

## 2014-10-25 ENCOUNTER — Emergency Department (HOSPITAL_COMMUNITY)
Admission: EM | Admit: 2014-10-25 | Discharge: 2014-10-25 | Disposition: A | Discharge status: Home / Self Care | Payer: Medicare Other | Attending: Emergency Medicine | Admitting: Emergency Medicine

## 2014-10-25 DIAGNOSIS — Z87448 Personal history of other diseases of urinary system: Secondary | ICD-10-CM | POA: Diagnosis not present

## 2014-10-25 DIAGNOSIS — M25552 Pain in left hip: Secondary | ICD-10-CM | POA: Diagnosis not present

## 2014-10-25 DIAGNOSIS — E119 Type 2 diabetes mellitus without complications: Secondary | ICD-10-CM | POA: Insufficient documentation

## 2014-10-25 DIAGNOSIS — Z79899 Other long term (current) drug therapy: Secondary | ICD-10-CM | POA: Insufficient documentation

## 2014-10-25 DIAGNOSIS — I10 Essential (primary) hypertension: Secondary | ICD-10-CM | POA: Insufficient documentation

## 2014-10-25 DIAGNOSIS — M1612 Unilateral primary osteoarthritis, left hip: Secondary | ICD-10-CM | POA: Diagnosis not present

## 2014-10-25 DIAGNOSIS — M79605 Pain in left leg: Secondary | ICD-10-CM | POA: Diagnosis present

## 2014-10-25 DIAGNOSIS — G8929 Other chronic pain: Secondary | ICD-10-CM | POA: Diagnosis not present

## 2014-10-25 DIAGNOSIS — Z87442 Personal history of urinary calculi: Secondary | ICD-10-CM | POA: Insufficient documentation

## 2014-10-25 DIAGNOSIS — Z8659 Personal history of other mental and behavioral disorders: Secondary | ICD-10-CM | POA: Insufficient documentation

## 2014-10-25 DIAGNOSIS — Z87891 Personal history of nicotine dependence: Secondary | ICD-10-CM | POA: Diagnosis not present

## 2014-10-25 DIAGNOSIS — Z792 Long term (current) use of antibiotics: Secondary | ICD-10-CM | POA: Insufficient documentation

## 2014-10-25 DIAGNOSIS — Z7952 Long term (current) use of systemic steroids: Secondary | ICD-10-CM | POA: Diagnosis not present

## 2014-10-25 MED ORDER — HYDROCODONE-ACETAMINOPHEN 5-325 MG PO TABS: 1.0000 | ORAL_TABLET | ORAL | Status: DC | PRN

## 2014-10-25 MED ORDER — HYDROCODONE-ACETAMINOPHEN 5-325 MG PO TABS
1.0000 | ORAL_TABLET | Freq: Once | ORAL | Status: AC
  Administered 2014-10-25: 1 via ORAL
  Filled 2014-10-25: qty 1

## 2014-10-25 NOTE — ED Notes (Signed)
Pain lt hip and leg for 1 week, Has had similar pain in past, but does not know what caused it.No recent injury

## 2014-10-25 NOTE — Discharge Instructions (Signed)

## 2014-10-25 NOTE — ED Provider Notes (Signed)
CSN: [id number]     Arrival date & time 10/25/14  2010 History  This chart was scribed for Daleen Bo, MD by Marlowe Kays, ED Scribe. This patient was seen in room APA18/APA18 and the patient's care was started at 10:19 PM.  Chief Complaint  Patient presents with  . Leg Pain   Patient is a 59 y.o. male presenting with leg pain. The history is provided by the patient and medical records. No language interpreter was used.  Leg Pain Associated symptoms: no fever     HPI Comments:  Finch Costanzo Belvin is a 59 y.o. male with PMHx of chronic pain who presents to the Emergency Department complaining of left sided abdominal pain that began approximately one year ago. He states the pain radiates down his left leg. He reports the pain has been worsening over the past week. He has not been doing anything to treat his pain. Certain movements and walking makes the pain worse. Denies alleviating factors. Denies fever, chills, nausea or vomiting. He denies trauma, injury or fall. PMHx of HTN, depression, DM, diverticulitis, kidney transplant, kidney stone and renal disorder.  Past Medical History  Diagnosis Date  . Hypertension   . Depression   . Urinary retention   . Diabetes mellitus   . Chronic back pain   . Chronic hip pain   . Diverticulitis   . H/O kidney transplant   . Renal disorder   . Kidney stone    Past Surgical History  Procedure Laterality Date  . Kidney transplant    . Hernia repair    . Nephrectomy transplanted organ    . Colonoscopy  05/27/2003    AOZ:HYQMVH terminal ileum,rectum, and colon   Family History  Problem Relation Age of Onset  . Hypertension Sister    History  Substance Use Topics  . Smoking status: Former Smoker -- 1.00 packs/day for 5 years    Types: Cigarettes    Quit date: 06/14/1972  . Smokeless tobacco: Never Used  . Alcohol Use: 0.6 oz/week    1 Cans of beer per week     Comment: occasionally    Review of Systems  Constitutional: Negative for  fever and chills.  Gastrointestinal: Negative for nausea and vomiting.  Musculoskeletal: Positive for arthralgias.  All other systems reviewed and are negative.   Allergies  Codeine and Vasotec  Home Medications   Prior to Admission medications   Medication Sig Start Date End Date Taking? Authorizing Provider  atorvastatin (LIPITOR) 10 MG tablet Take 10 mg by mouth every morning.    Yes Historical Provider, MD  cycloSPORINE (SANDIMMUNE) 100 MG capsule Take 100 mg by mouth 2 (two) times daily. Take with 25 mg tablet to equal 125 mg twice daily   Yes Historical Provider, MD  cycloSPORINE (SANDIMMUNE) 25 MG capsule Take 25 mg by mouth 2 (two) times daily. Take with 100 mg tablet to equal dose of 125 mg twice daily   Yes Historical Provider, MD  esomeprazole (NEXIUM) 40 MG capsule Take 40 mg by mouth 2 (two) times daily.    Yes Historical Provider, MD  HYDROcodone-acetaminophen (NORCO/VICODIN) 5-325 MG per tablet Take 1 tablet by mouth every 4 (four) hours as needed for moderate pain.   Yes Historical Provider, MD  Multiple Vitamin (MULTIVITAMIN WITH MINERALS) TABS tablet Take 1 tablet by mouth daily.   Yes Historical Provider, MD  Multiple Vitamin (MULTIVITAMIN) tablet Take 1 tablet by mouth daily.   Yes Historical Provider, MD  predniSONE (DELTASONE) 5  MG tablet Take 5 mg by mouth every morning.    Yes Historical Provider, MD  tamsulosin (FLOMAX) 0.4 MG CAPS capsule Take 0.4 mg by mouth daily.    Yes Historical Provider, MD  cyclobenzaprine (FLEXERIL) 5 MG tablet Take 1 tablet (5 mg total) by mouth 3 (three) times daily as needed for muscle spasms. Patient not taking: Reported on 10/25/2014 06/22/14   Ashley Murrain, NP  oxyCODONE-acetaminophen (ROXICET) 5-325 MG per tablet Take 1 tablet by mouth every 4 (four) hours as needed for severe pain. Patient not taking: Reported on 10/25/2014 06/22/14   Ashley Murrain, NP   Triage Vitals: BP 110/71 mmHg  Pulse 74  Temp(Src) 98.9 F (37.2 C) (Oral)  Resp  20  SpO2 100% Physical Exam  Constitutional: He is oriented to person, place, and time. He appears well-developed and well-nourished.  HENT:  Head: Normocephalic and atraumatic.  Right Ear: External ear normal.  Left Ear: External ear normal.  Eyes: Conjunctivae and EOM are normal. Pupils are equal, round, and reactive to light.  Neck: Normal range of motion and phonation normal. Neck supple.  Cardiovascular: Normal rate, regular rhythm and normal heart sounds.   Pulmonary/Chest: Effort normal and breath sounds normal. He exhibits no bony tenderness.  Abdominal: Soft. There is no tenderness.  Musculoskeletal: Normal range of motion.  Neurological: He is alert and oriented to person, place, and time. No cranial nerve deficit or sensory deficit. He exhibits normal muscle tone. Coordination normal.  Skin: Skin is warm, dry and intact.  Psychiatric: He has a normal mood and affect. His behavior is normal. Judgment and thought content normal.  Nursing note and vitals reviewed.   ED Course  Procedures (including critical care time)  Review of prior diagnostic imaging of left hip indicates that he has significant left hip joint disease with bone-on-bone.  DIAGNOSTIC STUDIES: Oxygen Saturation is 100% on RA, normal by my interpretation.   COORDINATION OF CARE: 10:23 PM- Will evaluate labs and recheck patient. Pt verbalizes understanding and agrees to plan.  Medications - No data to display  Labs Review Labs Reviewed - No data to display  Imaging Review No results found.   EKG Interpretation None      MDM   Final diagnoses:  Hip pain, left  Osteoarthritis of left hip, unspecified osteoarthritis type    Left hip pain secondary to arthritis, long-standing, since 2005. Since then, it has been progressive. Doubt fracture, septic arthritis or significant lumbar myelopathy.  Nursing Notes Reviewed/ Care Coordinated Applicable Imaging Reviewed Interpretation of Laboratory Data  incorporated into ED treatment  The patient appears reasonably screened and/or stabilized for discharge and I doubt any other medical condition or other [facility] requiring further screening, evaluation, or treatment in the ED at this time prior to discharge.  Plan: Home Medications- Norco; Home Treatments- rest; return here if the recommended treatment, does not improve the symptoms; Recommended follow up- orthopedic follow-up 1 week.   I personally performed the services described in this documentation, which was scribed in my presence. The recorded information has been reviewed and is accurate.    Daleen Bo, MD 10/26/14 [phone]

## 2014-10-31 DIAGNOSIS — M1612 Unilateral primary osteoarthritis, left hip: Secondary | ICD-10-CM | POA: Diagnosis not present

## 2014-10-31 DIAGNOSIS — Z940 Kidney transplant status: Secondary | ICD-10-CM | POA: Diagnosis not present

## 2014-10-31 DIAGNOSIS — Z6824 Body mass index (BMI) 24.0-24.9, adult: Secondary | ICD-10-CM | POA: Diagnosis not present

## 2014-10-31 DIAGNOSIS — I10 Essential (primary) hypertension: Secondary | ICD-10-CM | POA: Diagnosis not present

## 2014-11-04 DIAGNOSIS — I10 Essential (primary) hypertension: Secondary | ICD-10-CM | POA: Diagnosis not present

## 2014-11-04 DIAGNOSIS — K219 Gastro-esophageal reflux disease without esophagitis: Secondary | ICD-10-CM | POA: Diagnosis not present

## 2014-11-04 DIAGNOSIS — M1612 Unilateral primary osteoarthritis, left hip: Secondary | ICD-10-CM | POA: Diagnosis not present

## 2014-11-21 DIAGNOSIS — M25552 Pain in left hip: Secondary | ICD-10-CM | POA: Diagnosis not present

## 2014-11-21 DIAGNOSIS — Z79899 Other long term (current) drug therapy: Secondary | ICD-10-CM | POA: Diagnosis not present

## 2014-11-21 DIAGNOSIS — Z7952 Long term (current) use of systemic steroids: Secondary | ICD-10-CM | POA: Diagnosis not present

## 2014-11-21 DIAGNOSIS — M1612 Unilateral primary osteoarthritis, left hip: Secondary | ICD-10-CM | POA: Diagnosis not present

## 2014-11-21 DIAGNOSIS — Z940 Kidney transplant status: Secondary | ICD-10-CM | POA: Diagnosis not present

## 2014-11-26 DIAGNOSIS — Z886 Allergy status to analgesic agent status: Secondary | ICD-10-CM | POA: Diagnosis not present

## 2014-11-26 DIAGNOSIS — I120 Hypertensive chronic kidney disease with stage 5 chronic kidney disease or end stage renal disease: Secondary | ICD-10-CM | POA: Diagnosis not present

## 2014-11-26 DIAGNOSIS — Z79899 Other long term (current) drug therapy: Secondary | ICD-10-CM | POA: Diagnosis not present

## 2014-11-26 DIAGNOSIS — R112 Nausea with vomiting, unspecified: Secondary | ICD-10-CM | POA: Diagnosis not present

## 2014-11-26 DIAGNOSIS — M25552 Pain in left hip: Secondary | ICD-10-CM | POA: Diagnosis not present

## 2014-11-26 DIAGNOSIS — K2950 Unspecified chronic gastritis without bleeding: Secondary | ICD-10-CM | POA: Diagnosis not present

## 2014-11-26 DIAGNOSIS — E785 Hyperlipidemia, unspecified: Secondary | ICD-10-CM | POA: Diagnosis not present

## 2014-11-26 DIAGNOSIS — Z7952 Long term (current) use of systemic steroids: Secondary | ICD-10-CM | POA: Diagnosis not present

## 2014-11-26 DIAGNOSIS — N186 End stage renal disease: Secondary | ICD-10-CM | POA: Diagnosis not present

## 2014-11-26 DIAGNOSIS — Z940 Kidney transplant status: Secondary | ICD-10-CM | POA: Diagnosis not present

## 2014-11-26 DIAGNOSIS — D631 Anemia in chronic kidney disease: Secondary | ICD-10-CM | POA: Diagnosis not present

## 2014-12-04 DIAGNOSIS — Z01810 Encounter for preprocedural cardiovascular examination: Secondary | ICD-10-CM | POA: Diagnosis not present

## 2014-12-04 DIAGNOSIS — I10 Essential (primary) hypertension: Secondary | ICD-10-CM | POA: Diagnosis not present

## 2014-12-17 ENCOUNTER — Ambulatory Visit (HOSPITAL_COMMUNITY): Payer: Medicare Other | Attending: Adult Reconstructive Orthopaedic Surgery | Admitting: Physical Therapy

## 2014-12-17 DIAGNOSIS — R29898 Other symptoms and signs involving the musculoskeletal system: Secondary | ICD-10-CM | POA: Diagnosis not present

## 2014-12-17 DIAGNOSIS — G8929 Other chronic pain: Secondary | ICD-10-CM

## 2014-12-17 DIAGNOSIS — R262 Difficulty in walking, not elsewhere classified: Secondary | ICD-10-CM

## 2014-12-17 DIAGNOSIS — M25552 Pain in left hip: Secondary | ICD-10-CM | POA: Insufficient documentation

## 2014-12-17 DIAGNOSIS — R2689 Other abnormalities of gait and mobility: Secondary | ICD-10-CM

## 2014-12-17 NOTE — Patient Instructions (Signed)
Knee Extension (Sitting)   Place _0___ pound weight on left ankle and straighten knee fully, lower slowly. Repeat _15___ times per set. Do _1___ sets per session. Do _2___ sessions per day.  http://orth.exer.us/732   Copyright  VHI. All rights reserved.  Strengthening: Quadriceps Set   Tighten muscles on top of thighs by pushing knees down into surface. Hold _3___ seconds. Repeat __15__ times per set. Do 1____ sets per session. Do ___2_ sessions per day.  http://orth.exer.us/602   Copyright  VHI. All rights reserved.  Strengthening: Straight Leg Raise (Phase 1)   Tighten muscles on front of right thigh, then lift leg __18__ inches from surface, keeping knee locked.  Repeat _15___ times per set. Do _1_ sets per session. Do ___2_ sessions per day.  http://orth.exer.us/614   Copyright  VHI. All rights reserved.  Strengthening: Hip Abduction (Side-Lying)   Tighten muscles on front of left thigh, then lift leg _15___ inches from surface, keeping knee locked.  Repeat _15___ times per set. Do __1__ sets per session. Do __2__ sessions per day.  http://orth.exer.us/622   Copyright  VHI. All rights reserved.  Strengthening: Hip Extension (Prone)   Tighten muscles on front of left thigh, then lift leg __2__ inches from surface, keeping knee locked. Repeat 10-15____ times per set. Do ____1 sets per session. Do 2____ sessions per day.  http://orth.exer.us/620   Copyright  VHI. All rights reserved.  Balance: Unilateral   Attempt to balance on left leg, eyes open. Hold __20-30__ seconds. Repeat __10__ times per set. Do _1___ sets per session. Do ___3_ sessions per day. Perform exercise with eyes closed.  http://orth.exer.us/28   Copyright  VHI. All rights reserved.  Heel Raise: Bilateral (Standing)   Rise on balls of feet. Repeat __15__ times per set. Do ___1_ sets per session. Do __3__ sessions per day.  http://orth.exer.us/38   Copyright  VHI. All rights  reserved.

## 2014-12-17 NOTE — Therapy (Signed)
Robert Durham, [place], [address] Phone: [phone]   Fax:  [phone]  Physical Therapy Evaluation  Patient Details  Name: Robert Durham MRN: [id number] Date of Birth: 21-Jul-1955 Referring Provider:  Rosanne Ashing, MD  Encounter Date: 12/17/2014      PT End of Session - 12/17/14 1559    Visit Number 1   Number of Visits 16   Date for PT Re-Evaluation 01/16/15   Authorization Type medicare   PT Start Time 1518   PT Stop Time 1555   PT Time Calculation (min) 37 min   Activity Tolerance Patient tolerated treatment well      Past Medical History  Diagnosis Date  . Hypertension   . Depression   . Urinary retention   . Diabetes mellitus   . Chronic back pain   . Chronic hip pain   . Diverticulitis   . H/O kidney transplant   . Renal disorder   . Kidney stone     Past Surgical History  Procedure Laterality Date  . Kidney transplant    . Hernia repair    . Nephrectomy transplanted organ    . Colonoscopy  05/27/2003    POE:UMPNTI terminal ileum,rectum, and colon    There were no vitals filed for this visit.  Visit Diagnosis:  Hip pain, chronic, left  Difficulty walking  Weakness of left hip  Unstable balance      Subjective Assessment - 12/17/14 1533    How long can you sit comfortably? 10   How long can you stand comfortably? less than 2 minutes   How long can you walk comfortably? ambulates with a cane less than five minutes at a time.    Patient Stated Goals less pain    Pain Score 8    Pain Location Hip   Pain Orientation Left   Pain Descriptors / Indicators Aching            [facility] PT Assessment - 12/17/14 0001    Assessment   Medical Diagnosis Pre-op for Lt THR   Onset Date/Surgical Date 12/20/14   Next MD Visit 12/20/2014   Precautions   Precautions Fall   Restrictions   Weight Bearing Restrictions No   Balance Screen   Has the patient fallen in the past 6 months No    Has the patient had a decrease in activity level because of a fear of falling?  Yes   Is the patient reluctant to leave their home because of a fear of falling?  Yes   [place] Private residence   Living Arrangements Spouse/significant other   Type of [place] to enter   Entrance Stairs-Number of Steps 2   Entrance Stairs-Rails Right   [place] One level   Prior Function   Level of Independence Independent with community mobility with device   Vocation On disability   Leisure hunting; fishing   Cognition   Overall Cognitive Status Within Functional Limits for tasks assessed   Observation/Other Assessments   Focus on Therapeutic Outcomes (FOTO)  2   Functional Tests   Functional tests Single leg stance   Single Leg Stance   Comments Rt 60 sec; LT 15    ROM / Strength   AROM / PROM / Strength Strength   Strength   Strength Assessment Site Hip;Knee;Ankle   Right/Left Hip Left   Left Hip Flexion 2+/5   Left  Hip Extension 3-/5   Left Hip ABduction 4/5   Left Hip ADduction 5/5   Right/Left Knee Left   Left Knee Flexion 4/5   Left Knee Extension 3/5   Right/Left Ankle Left   Left Ankle Dorsiflexion 4/5   Left Ankle Plantar Flexion 3/5                   [facility] Adult PT Treatment/Exercise - 12/17/14 0001    Exercises   Exercises Knee/Hip   Knee/Hip Exercises: Standing   Heel Raises Both;10 reps   SLS x3   Knee/Hip Exercises: Seated   Long Arc Quad Left;10 reps   Knee/Hip Exercises: Supine   Quad Sets 10 reps   Straight Leg Raises Left;5 reps   Knee/Hip Exercises: Sidelying   Hip ABduction Left;5 reps   Knee/Hip Exercises: Prone   Hip Extension 10 reps                PT Education - 12/17/14 1558    Education provided Yes   Education Details HEP; pt given Total joint booklet    Person(s) Educated Patient   Methods Explanation;Handout   Comprehension Verbalized understanding;Returned  demonstration          PT Short Term Goals - 12/17/14 1613    PT SHORT TERM GOAL #1   Title I HEP    Time 1   Period Days   PT SHORT TERM GOAL #2   Title Pt Pain to be no greater than a 5/10 80% of the day.    Time 3   Period Weeks   PT SHORT TERM GOAL #3   Title Pt to be ambulating for short distances without his cane inside, ie bedroom to bathroom   Time 3   Period Weeks   PT SHORT TERM GOAL #4   Title Pt to be able to sit for 30 mintues without increased pain to enjoy eating a meal at the table   Time 3   Period Weeks   PT SHORT TERM GOAL #5   Title Pt to be able to stand for 10 mintues without difficulty to perform grooming activity    Time 3   Period Weeks           PT Long Term Goals - 12/17/14 1616    PT LONG TERM GOAL #1   Title I in advance HEP   Time 6   Period Weeks   PT LONG TERM GOAL #2   Title Pain level to be no greater than a 2/10 80% of the day    Time 6   Period Weeks   PT LONG TERM GOAL #3   Title Pt to be able sit for an hour to be able to socialize    Time 6   Period Weeks   PT LONG TERM GOAL #4   Title Pt to be able to walk without an assistive device inside and outside    Time 6   Period Weeks   PT LONG TERM GOAL #5   Title Pt to be able to walk for 20 mintues for better health habits   Time 6   Period Weeks               Plan - 12/17/14 1600    Clinical Impression Statement Robert Durham is a 59 yo male who has chronic OA in his Lt hip.  He was given a prescription to attend physical therapy for pre-op strengthening on June 9th  but only called to make his appointment today therefore he will only be able to attend one session prior to his operation which is scheduled for July 8th.  Evaluation demonstrates decreased balance, decreased strength, increased pain, and abnormal gait.  He will benefit from skilled PT  both before and after his THR to maximize his I and functional ability.  The patient was given a HEP as well as a total  joiint replacement booklet explaining what to expect throughout his hospital stay.  Robert Durham was made aware that this is the routine for [facility] facilities and there maybe some variation however things should be somewhat similar.     Pt will benefit from skilled therapeutic intervention in order to improve on the following deficits Abnormal gait;Decreased activity tolerance;Decreased balance;Decreased strength;Difficulty walking;Pain   Rehab Potential Good   PT Frequency 2x / week   PT Duration 6 weeks  Pt will schedule for his next visit after his operation.    PT Treatment/Interventions ADLs/Self Care Home Management;Gait training;Stair training;Functional mobility training;Therapeutic activities;Therapeutic exercise;Balance training;Patient/family education   PT Next Visit Plan Pt will need a quick reassessment due to the fact that he will have had his THR.    PT Home Exercise Plan given, suggested having a rail put up by the steps that enter into his house as well as picking up any throw rugs off the floor.;    Consulted and Agree with Plan of Care Patient          G-Codes - 12-26-2014 1620    Functional Assessment Tool Used foto   Functional Limitation Mobility: Walking and moving around   Mobility: Walking and Moving Around Current Status [phone]) At least 80 percent but less than 100 percent impaired, limited or restricted   Mobility: Walking and Moving Around Goal Status [phone]) At least 40 percent but less than 60 percent impaired, limited or restricted       Problem List There are no active problems to display for this patient.   Rayetta Humphrey, PT CLT [phone] 2014/12/26, 4:21 PM  [place] [address] [place], [place], [address] Phone: [phone]   Fax:  [phone]

## 2014-12-20 DIAGNOSIS — G250 Essential tremor: Secondary | ICD-10-CM | POA: Diagnosis present

## 2014-12-20 DIAGNOSIS — N186 End stage renal disease: Secondary | ICD-10-CM | POA: Diagnosis present

## 2014-12-20 DIAGNOSIS — D696 Thrombocytopenia, unspecified: Secondary | ICD-10-CM | POA: Diagnosis not present

## 2014-12-20 DIAGNOSIS — K219 Gastro-esophageal reflux disease without esophagitis: Secondary | ICD-10-CM | POA: Diagnosis present

## 2014-12-20 DIAGNOSIS — E559 Vitamin D deficiency, unspecified: Secondary | ICD-10-CM | POA: Diagnosis present

## 2014-12-20 DIAGNOSIS — E872 Acidosis: Secondary | ICD-10-CM | POA: Diagnosis not present

## 2014-12-20 DIAGNOSIS — Z79899 Other long term (current) drug therapy: Secondary | ICD-10-CM | POA: Diagnosis not present

## 2014-12-20 DIAGNOSIS — Z87891 Personal history of nicotine dependence: Secondary | ICD-10-CM | POA: Diagnosis not present

## 2014-12-20 DIAGNOSIS — G8918 Other acute postprocedural pain: Secondary | ICD-10-CM | POA: Diagnosis not present

## 2014-12-20 DIAGNOSIS — D899 Disorder involving the immune mechanism, unspecified: Secondary | ICD-10-CM | POA: Diagnosis not present

## 2014-12-20 DIAGNOSIS — M25552 Pain in left hip: Secondary | ICD-10-CM | POA: Diagnosis not present

## 2014-12-20 DIAGNOSIS — I120 Hypertensive chronic kidney disease with stage 5 chronic kidney disease or end stage renal disease: Secondary | ICD-10-CM | POA: Diagnosis present

## 2014-12-20 DIAGNOSIS — Z940 Kidney transplant status: Secondary | ICD-10-CM | POA: Diagnosis not present

## 2014-12-20 DIAGNOSIS — E785 Hyperlipidemia, unspecified: Secondary | ICD-10-CM | POA: Diagnosis present

## 2014-12-20 DIAGNOSIS — F329 Major depressive disorder, single episode, unspecified: Secondary | ICD-10-CM | POA: Diagnosis present

## 2014-12-20 DIAGNOSIS — Z992 Dependence on renal dialysis: Secondary | ICD-10-CM | POA: Diagnosis not present

## 2014-12-20 DIAGNOSIS — M1612 Unilateral primary osteoarthritis, left hip: Secondary | ICD-10-CM | POA: Diagnosis not present

## 2014-12-20 DIAGNOSIS — D62 Acute posthemorrhagic anemia: Secondary | ICD-10-CM | POA: Diagnosis not present

## 2014-12-20 DIAGNOSIS — E871 Hypo-osmolality and hyponatremia: Secondary | ICD-10-CM | POA: Diagnosis not present

## 2014-12-24 ENCOUNTER — Ambulatory Visit (HOSPITAL_COMMUNITY): Payer: Medicare Other | Admitting: Physical Therapy

## 2014-12-24 DIAGNOSIS — R262 Difficulty in walking, not elsewhere classified: Secondary | ICD-10-CM | POA: Diagnosis not present

## 2014-12-24 DIAGNOSIS — R2689 Other abnormalities of gait and mobility: Secondary | ICD-10-CM

## 2014-12-24 DIAGNOSIS — R269 Unspecified abnormalities of gait and mobility: Secondary | ICD-10-CM

## 2014-12-24 DIAGNOSIS — G8929 Other chronic pain: Secondary | ICD-10-CM

## 2014-12-24 DIAGNOSIS — M25552 Pain in left hip: Secondary | ICD-10-CM | POA: Diagnosis not present

## 2014-12-24 DIAGNOSIS — R29898 Other symptoms and signs involving the musculoskeletal system: Secondary | ICD-10-CM

## 2014-12-24 NOTE — Therapy (Signed)
[place], [place], [address] Phone: [phone]   Fax:  [phone]  Physical Therapy Evaluation  Patient Details  Name: Robert Durham MRN: [id number] Date of Birth: [birth year] Referring Provider:  Rosanne Ashing, MD  Encounter Date: 12/24/2014      PT End of Session - 12/24/14 0930    Visit Number 1   Number of Visits 16   Date for PT Re-Evaluation 01/23/15   Authorization Type medicare   PT Start Time 0850   PT Stop Time 0930   PT Time Calculation (min) 40 min   Equipment Utilized During Treatment Gait belt   Activity Tolerance Patient tolerated treatment well      Past Medical History  Diagnosis Date  . Hypertension   . Depression   . Urinary retention   . Diabetes mellitus   . Chronic back pain   . Chronic hip pain   . Diverticulitis   . H/O kidney transplant   . Renal disorder   . Kidney stone     Past Surgical History  Procedure Laterality Date  . Kidney transplant    . Hernia repair    . Nephrectomy transplanted organ    . Colonoscopy  05/27/2003    ONG:EXBMWU terminal ileum,rectum, and colon    There were no vitals filed for this visit.  Visit Diagnosis:  Hip pain, chronic, left  Difficulty walking  Weakness of left hip  Unstable balance  Abnormality of gait      Subjective Assessment - 12/24/14 0849    Subjective Robert Durham has had Lt hip pain for years.  He is had a postierior  Lt hip replacement on 12/20/2014 at [facility].  [name] states he at this time he is in  a lot of pain.  He is walking with a walker with approximately 25% of his weight on his leg.  He is being referred to therapy to increase his functional ability.     Pertinent History HTN, Kidney transplant.     How long can you sit comfortably? 20 minutes   How long can you stand comfortably? unable to stand without the walker; less than five minutes.    How long can you walk comfortably? Pt was walking  with a cane now uses a walker for less than five minutes    Currently in Pain? Yes   Pain Score 7    Pain Location Hip   Pain Orientation Left   Pain Descriptors / Indicators Aching;Sharp   Pain Type Surgical pain   Pain Onset In the past 7 days   Aggravating Factors  activity             [facility] PT Assessment - 12/24/14 0001    Assessment   Medical Diagnosis  Lt THR   Onset Date/Surgical Date 12/20/14   Next MD Visit 12/20/2014   Precautions   Precautions Posterior Hip;Fall   Restrictions   Weight Bearing Restrictions No   Balance Screen   Has the patient fallen in the past 6 months No   Has the patient had a decrease in activity level because of a fear of falling?  Yes   Is the patient reluctant to leave their home because of a fear of falling?  Yes   [place] residence   Living Arrangements Spouse/significant other   Type of [place] to enter   Entrance [place] of  Steps 2   Entrance Stairs-Rails Right   Home Layout One level   Prior Function   Level of Independence Independent with community mobility with device   Vocation On disability   Leisure hunting; fishing   Cognition   Overall Cognitive Status Within Functional Limits for tasks assessed   Observation/Other Assessments   Focus on Therapeutic Outcomes (FOTO)  2   Functional Tests   Functional tests --   Single Leg Stance   Comments --   Strength   Left Hip Flexion 2-/5   Left Hip Extension 2+/5   Left Hip ABduction 2-/5   Left Hip ADduction 2+/5   Left Knee Flexion 4/5   Left Knee Extension 4-/5   Left Ankle Dorsiflexion 3+/5   Left Ankle Plantar Flexion 3-/5   Ambulation/Gait   Ambulation/Gait Yes   Ambulation/Gait Assistance 6: Modified independent (Device/Increase time)   Ambulation Distance (Feet) 226 Feet   Assistive device Rolling walker   Gait Pattern Decreased step length - right;Decreased stance time - left   6 Minute Walk-  Baseline   6 Minute Walk- Baseline --   6 minute walk test results    Aerobic Endurance Distance Walked 286 ft                   [facility] Adult PT Treatment/Exercise - 12/24/14 0001    Knee/Hip Exercises: Standing   Heel Raises 10 reps   Functional Squat 10 reps   Knee/Hip Exercises: Seated   Long Arc Quad 10 reps   Knee/Hip Exercises: Supine   Quad Sets 10 reps   Heel Slides 10 reps   Bridges with Ball Squeeze 10 reps   Other Supine Knee/Hip Exercises ankle pumps; glut sets; ham set x 10 @   Other Supine Knee/Hip Exercises hip ab/adduction x 10                 PT Education - 12/24/14 0930    Education provided Yes   Education Details HEP    Person(s) Educated Patient   Methods Explanation   Comprehension Verbalized understanding          PT Short Term Goals - 12/24/14 0935    PT SHORT TERM GOAL #1   Title I HEP    Time 1   Period Weeks   Status New   PT SHORT TERM GOAL #2   Title Pt Pain to be no greater than a 5/10 80% of the day.    Time 3   Period Weeks   Status New   PT SHORT TERM GOAL #3   Title Pt to be ambulating for short distances without his cane inside, ie bedroom to bathroom   Time 3   Period Weeks   Status New   PT SHORT TERM GOAL #4   Title Pt to be able to sit for 30 mintues without increased pain to enjoy eating a meal at the table   Time 3   Period Weeks   Status New   PT SHORT TERM GOAL #5   Title Pt to be able to stand for 10 mintues without difficulty to perform grooming activity    Time 3   Period Weeks   Status New           PT Long Term Goals - 12/24/14 1194    PT LONG TERM GOAL #1   Title I in advance HEP   Time 6   Period Weeks   Status New  PT LONG TERM GOAL #2   Title Pain level to be no greater than a 2/10 80% of the day    Time 6   Period Weeks   Status New   PT LONG TERM GOAL #3   Title Pt to be able sit for two hours to be able to socialize    Time 6   Period Weeks   PT LONG TERM GOAL #4    Title Pt to be able to walk without an assistive device inside and outside    Time 6   Period Weeks   Status New   PT LONG TERM GOAL #5   Title Pt to be able to walk for 20 mintues for better health habits   Time 6   Period Weeks   Status New               Plan - 12/25/14 0931    Clinical Impression Statement Robert Durham underwent a Lt THR on 12/20/2014 and is now being referred for outpaitent physical therapy to return him to his maximal functional ability.  Examination demonstrates abnormal gt, decreased LE strength, decreased balance, difficulty walking and decreaed activity tolerance.  Mr. Milks will benefit from skilled PT to address these issues and return him to his maximal functional ability.    Pt will benefit from skilled therapeutic intervention in order to improve on the following deficits Abnormal gait;Decreased activity tolerance;Decreased balance;Difficulty walking;Decreased strength;Pain   Rehab Potential Good   PT Frequency 3x / week   PT Duration 6 weeks   PT Treatment/Interventions ADLs/Self Care Home Management;Gait training;Stair training;Functional mobility training;Therapeutic activities;Therapeutic exercise;Balance training;Patient/family education   PT Next Visit Plan Pt to begin standing hip abduction/ extension, rockerboard, lateral and forward step ups, SLS gait train with cane progress to higher balance and functional strengthening as able.    PT Home Exercise Plan given          G-Codes - Dec 25, 2014 2500    Functional Assessment Tool Used foto   Functional Limitation Mobility: Walking and moving around   Mobility: Walking and Moving Around Current Status [phone]) At least 80 percent but less than 100 percent impaired, limited or restricted   Mobility: Walking and Moving Around Goal Status [phone]) At least 40 percent but less than 60 percent impaired, limited or restricted       Problem List There are no active problems to display for this  patient.   Rayetta Humphrey, PT CLT [phone] 12-25-2014, 9:37 AM  [place] [address] [place], [place], [address] Phone: [phone]   Fax:  [phone]

## 2014-12-24 NOTE — Patient Instructions (Signed)
Heel Raise: Bilateral (Standing)   Rise on balls of feet. Repeat _10___ times per set. Do __1__ sets per session. Do __3__ sessions per day.  http://orth.exer.us/38   Copyright  VHI. All rights reserved.  Functional Quadriceps: Chair Squat   Keeping feet flat on floor, shoulder width apart, squat as low as is comfortable. Use support as necessary. Repeat _10___ times per set. Do __1 http://orth.exer.us/736   Copyright  VHI. All rights reserved.  Bridging   Slowly raise buttocks from floor, keeping stomach tight. Repeat _10___ times per set. Do __1__ sets per session. Do _2___ sessions per day.  http://orth.exer.us/1096   Copyright  VHI. All rights reserved.

## 2014-12-29 ENCOUNTER — Emergency Department (HOSPITAL_COMMUNITY)
Admission: EM | Admit: 2014-12-29 | Discharge: 2014-12-29 | Disposition: A | Discharge status: Home / Self Care | Payer: Medicare Other | Attending: Emergency Medicine | Admitting: Emergency Medicine

## 2014-12-29 ENCOUNTER — Emergency Department (HOSPITAL_COMMUNITY): Payer: Medicare Other

## 2014-12-29 ENCOUNTER — Encounter (HOSPITAL_COMMUNITY): Payer: Self-pay | Admitting: *Deleted

## 2014-12-29 DIAGNOSIS — Z87448 Personal history of other diseases of urinary system: Secondary | ICD-10-CM | POA: Diagnosis not present

## 2014-12-29 DIAGNOSIS — Z87442 Personal history of urinary calculi: Secondary | ICD-10-CM | POA: Insufficient documentation

## 2014-12-29 DIAGNOSIS — E119 Type 2 diabetes mellitus without complications: Secondary | ICD-10-CM | POA: Insufficient documentation

## 2014-12-29 DIAGNOSIS — Z96642 Presence of left artificial hip joint: Secondary | ICD-10-CM | POA: Insufficient documentation

## 2014-12-29 DIAGNOSIS — M25552 Pain in left hip: Secondary | ICD-10-CM | POA: Diagnosis not present

## 2014-12-29 DIAGNOSIS — I10 Essential (primary) hypertension: Secondary | ICD-10-CM | POA: Insufficient documentation

## 2014-12-29 DIAGNOSIS — T8131XA Disruption of external operation (surgical) wound, not elsewhere classified, initial encounter: Secondary | ICD-10-CM | POA: Diagnosis not present

## 2014-12-29 DIAGNOSIS — Z79899 Other long term (current) drug therapy: Secondary | ICD-10-CM | POA: Insufficient documentation

## 2014-12-29 DIAGNOSIS — Z8719 Personal history of other diseases of the digestive system: Secondary | ICD-10-CM | POA: Diagnosis not present

## 2014-12-29 DIAGNOSIS — T8189XA Other complications of procedures, not elsewhere classified, initial encounter: Secondary | ICD-10-CM | POA: Diagnosis not present

## 2014-12-29 DIAGNOSIS — Z8659 Personal history of other mental and behavioral disorders: Secondary | ICD-10-CM | POA: Insufficient documentation

## 2014-12-29 DIAGNOSIS — G8929 Other chronic pain: Secondary | ICD-10-CM | POA: Diagnosis not present

## 2014-12-29 DIAGNOSIS — Z940 Kidney transplant status: Secondary | ICD-10-CM | POA: Diagnosis not present

## 2014-12-29 DIAGNOSIS — Z87891 Personal history of nicotine dependence: Secondary | ICD-10-CM | POA: Diagnosis not present

## 2014-12-29 MED ORDER — DOCUSATE SODIUM 100 MG PO CAPS
100.0000 mg | ORAL_CAPSULE | Freq: Once | ORAL | Status: AC
  Administered 2014-12-29: 100 mg via ORAL
  Filled 2014-12-29: qty 1

## 2014-12-29 MED ORDER — HYDROMORPHONE HCL 1 MG/ML IJ SOLN
1.0000 mg | Freq: Once | INTRAMUSCULAR | Status: AC
  Administered 2014-12-29: 1 mg via INTRAMUSCULAR
  Filled 2014-12-29: qty 1

## 2014-12-29 MED ORDER — ONDANSETRON 4 MG PO TBDP
4.0000 mg | ORAL_TABLET | Freq: Once | ORAL | Status: AC
  Administered 2014-12-29: 4 mg via ORAL
  Filled 2014-12-29: qty 1

## 2014-12-29 NOTE — ED Provider Notes (Signed)
TIME SEEN: 5:00 AM  CHIEF COMPLAINT:  Left hip pain  HPI: Pt is a 59 y.o.  Male with history of hypertension, diabetes, kidney transplant on cyclosporine and prednisone presents to the emergency department complaints of left hip pain. Patient has had chronic left hip pain for many years. He had a hip replacement by Dr. Demetrius Revel at [facility] on 12/20/14. Has been having pain since that time and reports tonight he thought it was uncontrolled. States he has been taking 20 mg of oxycodone every 3-4 hours. Denies new injury. Denies swelling or warmth. No fever. No numbness or focal weakness.  ROS: See HPI Constitutional: no fever  Eyes: no drainage  ENT: no runny nose   Cardiovascular:  no chest pain  Resp: no SOB  GI: no vomiting GU: no dysuria Integumentary: no rash  Allergy: no hives  Musculoskeletal: no leg swelling  Neurological: no slurred speech ROS otherwise negative  PAST MEDICAL HISTORY/PAST SURGICAL HISTORY:  Past Medical History  Diagnosis Date  . Hypertension   . Depression   . Urinary retention   . Diabetes mellitus   . Chronic back pain   . Chronic hip pain   . Diverticulitis   . H/O kidney transplant   . Renal disorder   . Kidney stone     MEDICATIONS:  Prior to Admission medications   Medication Sig Start Date End Date Taking? Authorizing Provider  oxycodone (OXY-IR) 5 MG capsule Take 5 mg by mouth every 4 (four) hours as needed (takes 1-3 tablets prn pain,).   Yes Historical Provider, MD  sennosides-docusate sodium (SENOKOT-S) 8.6-50 MG tablet Take 2 tablets by mouth 2 (two) times daily.   Yes Historical Provider, MD  atorvastatin (LIPITOR) 10 MG tablet Take 10 mg by mouth every morning.     Historical Provider, MD  cycloSPORINE (SANDIMMUNE) 100 MG capsule Take 100 mg by mouth 2 (two) times daily. Take with 25 mg tablet to equal 125 mg twice daily    Historical Provider, MD  cycloSPORINE (SANDIMMUNE) 25 MG capsule Take 25 mg by mouth 2 (two) times daily. Take with 100  mg tablet to equal dose of 125 mg twice daily    Historical Provider, MD  esomeprazole (NEXIUM) 40 MG capsule Take 40 mg by mouth 2 (two) times daily.     Historical Provider, MD  HYDROcodone-acetaminophen (NORCO) 5-325 MG per tablet Take 1 tablet by mouth every 4 (four) hours as needed. 10/25/14   Daleen Bo, MD  Multiple Vitamin (MULTIVITAMIN WITH MINERALS) TABS tablet Take 1 tablet by mouth daily.    Historical Provider, MD  Multiple Vitamin (MULTIVITAMIN) tablet Take 1 tablet by mouth daily.    Historical Provider, MD  predniSONE (DELTASONE) 5 MG tablet Take 5 mg by mouth every morning.     Historical Provider, MD  tamsulosin (FLOMAX) 0.4 MG CAPS capsule Take 0.4 mg by mouth daily.     Historical Provider, MD    ALLERGIES:  Allergies  Allergen Reactions  . Codeine Itching and Nausea And Vomiting  . Vasotec Itching and Nausea And Vomiting    SOCIAL HISTORY:  History  Substance Use Topics  . Smoking status: Former Smoker -- 1.00 packs/day for 5 years    Types: Cigarettes    Quit date: 06/14/1972  . Smokeless tobacco: Never Used  . Alcohol Use: 0.6 oz/week    1 Cans of beer per week     Comment: occasionally    FAMILY HISTORY: Family History  Problem Relation Age of Onset  .  Hypertension Sister     EXAM: BP 108/87 mmHg  Pulse 100  Temp(Src) 98.3 F (36.8 C) (Oral)  Resp 24  Ht 5\' 11"  (1.803 m)  Wt 175 lb (79.379 kg)  BMI 24.42 kg/m2  SpO2 100% CONSTITUTIONAL: Alert and oriented and responds appropriately to questions. Well-appearing; well-nourished HEAD: Normocephalic EYES: Conjunctivae clear, PERRL ENT: normal nose; no rhinorrhea; moist mucous membranes; pharynx without lesions noted NECK: Supple, no meningismus, no LAD  CARD: RRR; S1 and S2 appreciated; no murmurs, no clicks, no rubs, no gallops RESP: Normal chest excursion without splinting or tachypnea; breath sounds clear and equal bilaterally; no wheezes, no rhonchi, no rales, no hypoxia or respiratory  distress, speaking full sentences ABD/GI: Normal bowel sounds; non-distended; soft, non-tender, no rebound, no guarding, no peritoneal signs BACK:  The back appears normal and is non-tender to palpation, there is no CVA tenderness EXT:  Healing surgical scar to the lateral left hip without erythema or warmth, no drainage, no swelling or fluctuance, no joint effusion;  Tender to palpation diffusely over the left hip without deformity, no leg length discrepancy, otherwise Normal ROM in all joints;  Otherwise extremity is are non-tender to palpation; no edema; normal capillary refill; no cyanosis, no calf tenderness or swelling;  2+ DP pulses bilaterally   SKIN: Normal color for age and race; warm NEURO: Moves all extremities equally, sensation to light touch intact diffusely, cranial nerves II through XII intact PSYCH: The patient's mood and manner are appropriate. Grooming and personal hygiene are appropriate.  MEDICAL DECISION MAKING:  Patient here with complaints of chronic left hip pain. No new injury. No signs of septic arthritis. Neurovascular intact distally. Will obtain x-ray and give IM Dilaudid. Also reports he has been constipated. He is only taking Senokot. Have recommended he began MiraLAX, magnesium citrate, enemas as needed. His abdominal exam is benign. No distention. No vomiting.  ED PROGRESS: Xray appears normal.  Pain has improved.  Will dc home.  Recommended close outpt follow up with his orthopedist. Discussed return precautions. Patient has plenty of pain medication. He verbalizes understanding and is comfortable with plan.     [employer], DO 12/29/14 [phone]

## 2014-12-29 NOTE — Discharge Instructions (Signed)
Hip Pain Your hip is the joint between your upper legs and your lower pelvis. The bones, cartilage, tendons, and muscles of your hip joint perform a lot of work each day supporting your body weight and allowing you to move around. Hip pain can range from a minor ache to severe pain in one or both of your hips. Pain may be felt on the inside of the hip joint near the groin, or the outside near the buttocks and upper thigh. You may have swelling or stiffness as well.  HOME CARE INSTRUCTIONS   Take medicines only as directed by your health care provider.  Apply ice to the injured area:  Put ice in a plastic bag.  Place a towel between your skin and the bag.  Leave the ice on for 15-20 minutes at a time, 3-4 times a day.  Keep your leg raised (elevated) when possible to lessen swelling.  Avoid activities that cause pain.  Follow specific exercises as directed by your health care provider.  Sleep with a pillow between your legs on your most comfortable side.  Record how often you have hip pain, the location of the pain, and what it feels like. SEEK MEDICAL CARE IF:   You are unable to put weight on your leg.  Your hip is red or swollen or very tender to touch.  Your pain or swelling continues or worsens after 1 week.  You have increasing difficulty walking.  You have a fever. SEEK IMMEDIATE MEDICAL CARE IF:   You have fallen.  You have a sudden increase in pain and swelling in your hip. MAKE SURE YOU:   Understand these instructions.  Will watch your condition.  Will get help right away if you are not doing well or get worse. Document Released: 11/18/2009 Document Revised: 10/15/2013 Document Reviewed: 01/25/2013 ExitCare Patient Information 2015 [employer]. This information is not intended to replace advice given to you by your health care provider. Make sure you discuss any questions you have with your health care provider.  

## 2014-12-29 NOTE — ED Notes (Addendum)
Pt c/o left hip pain that became worse yesterday, is currently receiving therapy for hip pain, denies any injury, states that he had hip surgery at [facility] a week ago,

## 2014-12-30 ENCOUNTER — Ambulatory Visit (HOSPITAL_COMMUNITY): Payer: Medicare Other | Admitting: Physical Therapy

## 2014-12-30 DIAGNOSIS — R2689 Other abnormalities of gait and mobility: Secondary | ICD-10-CM

## 2014-12-30 DIAGNOSIS — G8929 Other chronic pain: Secondary | ICD-10-CM | POA: Diagnosis not present

## 2014-12-30 DIAGNOSIS — R262 Difficulty in walking, not elsewhere classified: Secondary | ICD-10-CM

## 2014-12-30 DIAGNOSIS — M25552 Pain in left hip: Principal | ICD-10-CM

## 2014-12-30 DIAGNOSIS — R29898 Other symptoms and signs involving the musculoskeletal system: Secondary | ICD-10-CM | POA: Diagnosis not present

## 2014-12-30 DIAGNOSIS — R269 Unspecified abnormalities of gait and mobility: Secondary | ICD-10-CM

## 2014-12-30 NOTE — Therapy (Signed)
[place], [place], [address] Phone: [phone]   Fax:  [phone]  Physical Therapy Treatment  Patient Details  Name: Robert Durham MRN: [id number] Date of Birth: April 30, 1956 Referring Provider:  Rosanne Ashing, MD  Encounter Date: 12/30/2014      PT End of Session - 12/30/14 0900    Visit Number 2   Number of Visits 16   Date for PT Re-Evaluation 01/23/15   Authorization Type medicare   PT Start Time 0805   PT Stop Time 0855   PT Time Calculation (min) 50 min   Equipment Utilized During Treatment Gait belt   Activity Tolerance Patient limited by fatigue;Patient limited by pain   Behavior During Therapy [facility] for tasks assessed/performed      Past Medical History  Diagnosis Date  . Hypertension   . Depression   . Urinary retention   . Diabetes mellitus   . Chronic back pain   . Chronic hip pain   . Diverticulitis   . H/O kidney transplant   . Renal disorder   . Kidney stone     Past Surgical History  Procedure Laterality Date  . Kidney transplant    . Hernia repair    . Nephrectomy transplanted organ    . Colonoscopy  05/27/2003    WHQ:PRFFMB terminal ileum,rectum, and colon    There were no vitals filed for this visit.  Visit Diagnosis:  Hip pain, chronic, left  Difficulty walking  Weakness of left hip  Unstable balance  Abnormality of gait      Subjective Assessment - 12/30/14 0819    Subjective Pt states he returned to the ED yesterday due to severe pain and was afraid he "broke" something.  Reports no falls just sudden severe pain.  Pt states he has 5/10 pain in his Lt hip currently.     Currently in Pain? Yes   Pain Score 5    Pain Location Hip   Pain Orientation Left                         [facility] Adult PT Treatment/Exercise - 12/30/14 0822    Knee/Hip Exercises: Stretches   Active Hamstring Stretch 3 reps;20 seconds   Active Hamstring Stretch  Limitations bilaterally   Knee/Hip Exercises: Standing   Heel Raises 10 reps   Heel Raises Limitations toeraises 10 reps   Forward Lunges Both;10 reps   Forward Lunges Limitations 4" box   Hip Abduction Both;10 reps   Hip Extension Both;10 reps   Lateral Step Up Left;10 reps;Step Height: 4";Hand Hold: 2   Forward Step Up Left;10 reps;Step Height: 4";Hand Hold: 1   Functional Squat 10 reps   Gait Training with RW, working on even stride and decreased antalgia                  PT Short Term Goals - 12/24/14 0935    PT SHORT TERM GOAL #1   Title I HEP    Time 1   Period Weeks   Status New   PT SHORT TERM GOAL #2   Title Pt Pain to be no greater than a 5/10 80% of the day.    Time 3   Period Weeks   Status New   PT SHORT TERM GOAL #3   Title Pt to be ambulating for short distances without his cane inside, ie bedroom to bathroom   Time 3  Period Weeks   Status New   PT SHORT TERM GOAL #4   Title Pt to be able to sit for 30 mintues without increased pain to enjoy eating a meal at the table   Time 3   Period Weeks   Status New   PT SHORT TERM GOAL #5   Title Pt to be able to stand for 10 mintues without difficulty to perform grooming activity    Time 3   Period Weeks   Status New           PT Long Term Goals - 12/24/14 0936    PT LONG TERM GOAL #1   Title I in advance HEP   Time 6   Period Weeks   Status New   PT LONG TERM GOAL #2   Title Pain level to be no greater than a 2/10 80% of the day    Time 6   Period Weeks   Status New   PT LONG TERM GOAL #3   Title Pt to be able sit for two hours to be able to socialize    Time 6   Period Weeks   PT LONG TERM GOAL #4   Title Pt to be able to walk without an assistive device inside and outside    Time 6   Period Weeks   Status New   PT LONG TERM GOAL #5   Title Pt to be able to walk for 20 mintues for better health habits   Time 6   Period Weeks   Status New               Plan - 12/30/14  0855    Clinical Impression Statement Lt THR on 12/20/14.  Pt with extreme antalgia upon entering clinic.  Pt wtih difficulty full weight bearing through Lt LE, relying on use of UE's.  PT also with forward bent posture, requiring constant VC's to correct and complete activities in good posture.  PT with noted dyspnea, requiring seated rest break between each set of exericses.  Added nustep to increase actvity tolerance and encouraged patient to walk out to car today with therapist.  Added standing hip abd/ext, rockerboard and step ups to POC.  Pt will need to be pushed to return to normal functioning.    PT Next Visit Plan Encourage all activities with decreased UE assistance.  Progress gait with SPC next visit and STS without UE's.   PT Home Exercise Plan given        Problem List There are no active problems to display for this patient.   Teena Irani, PTA/CLT [phone]  12/30/2014, 9:01 AM  [place], [place], [address] Phone: [phone]   Fax:  [phone]

## 2015-01-01 ENCOUNTER — Ambulatory Visit (HOSPITAL_COMMUNITY): Payer: Medicare Other | Admitting: Physical Therapy

## 2015-01-01 DIAGNOSIS — R262 Difficulty in walking, not elsewhere classified: Secondary | ICD-10-CM

## 2015-01-01 DIAGNOSIS — R269 Unspecified abnormalities of gait and mobility: Secondary | ICD-10-CM

## 2015-01-01 DIAGNOSIS — M25552 Pain in left hip: Secondary | ICD-10-CM | POA: Diagnosis not present

## 2015-01-01 DIAGNOSIS — R29898 Other symptoms and signs involving the musculoskeletal system: Secondary | ICD-10-CM | POA: Diagnosis not present

## 2015-01-01 DIAGNOSIS — R2689 Other abnormalities of gait and mobility: Secondary | ICD-10-CM

## 2015-01-01 DIAGNOSIS — G8929 Other chronic pain: Secondary | ICD-10-CM | POA: Diagnosis not present

## 2015-01-01 NOTE — Therapy (Signed)
[facility] [address] [place], [place], [address] Phone: [phone]   Fax:  [phone]  Physical Therapy Treatment  Patient Details  Name: Robert Durham MRN: [id number] Date of Birth: February 26, 1956 Referring Provider:  Rosanne Ashing, MD  Encounter Date: 01/01/2015      PT End of Session - 01/01/15 1056    Visit Number 3   Number of Visits 16   Date for PT Re-Evaluation 01/23/15   Authorization Type medicare   PT Start Time 1015   PT Stop Time 1102   PT Time Calculation (min) 47 min   Equipment Utilized During Treatment Gait belt   Activity Tolerance Patient tolerated treatment well   Behavior During Therapy [facility] for tasks assessed/performed      Past Medical History  Diagnosis Date  . Hypertension   . Depression   . Urinary retention   . Diabetes mellitus   . Chronic back pain   . Chronic hip pain   . Diverticulitis   . H/O kidney transplant   . Renal disorder   . Kidney stone     Past Surgical History  Procedure Laterality Date  . Kidney transplant    . Hernia repair    . Nephrectomy transplanted organ    . Colonoscopy  05/27/2003    IRW:ERXVQM terminal ileum,rectum, and colon    There were no vitals filed for this visit.  Visit Diagnosis:  Hip pain, chronic, left  Difficulty walking  Weakness of left hip  Unstable balance  Abnormality of gait      Subjective Assessment - 01/01/15 1018    Subjective Pt reports that he has had a lot of soreness along his incision site.    Currently in Pain? Yes   Pain Score 6    Pain Location Hip   Pain Orientation Left               [facility] Adult PT Treatment/Exercise - 01/01/15 0001    Ambulation/Gait   Ambulation/Gait Yes   Ambulation/Gait Assistance 5: Supervision   Ambulation Distance (Feet) 452 Feet   Assistive device Straight cane   Gait Pattern Decreased step length - right;Decreased stance time - left  IR of BLE   Knee/Hip Exercises: Stretches    Active Hamstring Stretch 3 reps;30 seconds   Active Hamstring Stretch Limitations bilaterally at 8 inch step   Knee/Hip Exercises: Aerobic   [place] 2 level 2 x 8 minutes   Knee/Hip Exercises: Standing   Heel Raises 15 reps   Forward Lunges Both;10 reps   Forward Lunges Limitations 4" box   Hip Abduction Both;10 reps   Hip Extension Both;10 reps   Lateral Step Up Left;10 reps;Step Height: 4";Hand Hold: 2   Forward Step Up Left;10 reps;Step Height: 4";Hand Hold: 1   Functional Squat 10 reps   Knee/Hip Exercises: Seated   Sit to Sand 10 reps;without UE support  x10 with L foot back   Knee/Hip Exercises: Supine   Bridges Limitations 15 reps   Straight Leg Raises Left;5 reps;2 sets                PT Education - 01/01/15 1055    Education provided Yes   Education Details Educated on hip precautions, avoiding IR during ambulation   Person(s) Educated Patient   Methods Explanation   Comprehension Verbalized understanding          PT Short Term Goals - 12/24/14 0935    PT SHORT TERM  GOAL #1   Title I HEP    Time 1   Period Weeks   Status New   PT SHORT TERM GOAL #2   Title Pt Pain to be no greater than a 5/10 80% of the day.    Time 3   Period Weeks   Status New   PT SHORT TERM GOAL #3   Title Pt to be ambulating for short distances without his cane inside, ie bedroom to bathroom   Time 3   Period Weeks   Status New   PT SHORT TERM GOAL #4   Title Pt to be able to sit for 30 mintues without increased pain to enjoy eating a meal at the table   Time 3   Period Weeks   Status New   PT SHORT TERM GOAL #5   Title Pt to be able to stand for 10 mintues without difficulty to perform grooming activity    Time 3   Period Weeks   Status New           PT Long Term Goals - 12/24/14 0936    PT LONG TERM GOAL #1   Title I in advance HEP   Time 6   Period Weeks   Status New   PT LONG TERM GOAL #2   Title Pain level to be no greater than a 2/10 80% of  the day    Time 6   Period Weeks   Status New   PT LONG TERM GOAL #3   Title Pt to be able sit for two hours to be able to socialize    Time 6   Period Weeks   PT LONG TERM GOAL #4   Title Pt to be able to walk without an assistive device inside and outside    Time 6   Period Weeks   Status New   PT LONG TERM GOAL #5   Title Pt to be able to walk for 20 mintues for better health habits   Time 6   Period Weeks   Status New               Plan - 01/01/15 1056    Clinical Impression Statement Treatment focused on improving LLE strength and gait mechanics. Pt was able to tolerate all therex very well in today's treatment, requiring only one rest break during treatment. He reported decreased pain post treatment, saying that the exercises were helping him to feel better. He had difficulty with SLR due to pain and hip weakness, requiring PT facilitation for first 2 reps. Pt required verbal cueing with gait training to avoid IR of bilateral feet.    PT Next Visit Plan Encourage all activities with decreased UE assistance.  Begin stepping over hurdles to improve foot clearance        Problem List There are no active problems to display for this patient.   Hilma Favors, PT, DPT [phone] 01/01/2015, 10:59 AM  [facility] [place], [place], [address] Phone: [phone]   Fax:  [phone]

## 2015-01-02 ENCOUNTER — Encounter (HOSPITAL_COMMUNITY): Payer: Medicare Other

## 2015-01-03 DIAGNOSIS — Z87442 Personal history of urinary calculi: Secondary | ICD-10-CM | POA: Diagnosis not present

## 2015-01-03 DIAGNOSIS — K219 Gastro-esophageal reflux disease without esophagitis: Secondary | ICD-10-CM | POA: Diagnosis not present

## 2015-01-03 DIAGNOSIS — Z940 Kidney transplant status: Secondary | ICD-10-CM | POA: Diagnosis not present

## 2015-01-03 DIAGNOSIS — R079 Chest pain, unspecified: Secondary | ICD-10-CM | POA: Diagnosis not present

## 2015-01-03 DIAGNOSIS — Z91048 Other nonmedicinal substance allergy status: Secondary | ICD-10-CM | POA: Diagnosis not present

## 2015-01-03 DIAGNOSIS — Z471 Aftercare following joint replacement surgery: Secondary | ICD-10-CM | POA: Diagnosis not present

## 2015-01-03 DIAGNOSIS — E785 Hyperlipidemia, unspecified: Secondary | ICD-10-CM | POA: Diagnosis not present

## 2015-01-03 DIAGNOSIS — R0600 Dyspnea, unspecified: Secondary | ICD-10-CM | POA: Diagnosis not present

## 2015-01-03 DIAGNOSIS — Z886 Allergy status to analgesic agent status: Secondary | ICD-10-CM | POA: Diagnosis not present

## 2015-01-03 DIAGNOSIS — R279 Unspecified lack of coordination: Secondary | ICD-10-CM | POA: Diagnosis not present

## 2015-01-03 DIAGNOSIS — Z96642 Presence of left artificial hip joint: Secondary | ICD-10-CM | POA: Diagnosis not present

## 2015-01-03 DIAGNOSIS — Z888 Allergy status to other drugs, medicaments and biological substances status: Secondary | ICD-10-CM | POA: Diagnosis not present

## 2015-01-03 DIAGNOSIS — Z87891 Personal history of nicotine dependence: Secondary | ICD-10-CM | POA: Diagnosis not present

## 2015-01-03 DIAGNOSIS — Z743 Need for continuous supervision: Secondary | ICD-10-CM | POA: Diagnosis not present

## 2015-01-03 DIAGNOSIS — Z885 Allergy status to narcotic agent status: Secondary | ICD-10-CM | POA: Diagnosis not present

## 2015-01-03 DIAGNOSIS — Z7901 Long term (current) use of anticoagulants: Secondary | ICD-10-CM | POA: Diagnosis not present

## 2015-01-03 DIAGNOSIS — Z8659 Personal history of other mental and behavioral disorders: Secondary | ICD-10-CM | POA: Diagnosis not present

## 2015-01-03 DIAGNOSIS — R0602 Shortness of breath: Secondary | ICD-10-CM | POA: Diagnosis not present

## 2015-01-03 DIAGNOSIS — I10 Essential (primary) hypertension: Secondary | ICD-10-CM | POA: Diagnosis not present

## 2015-01-08 ENCOUNTER — Ambulatory Visit (HOSPITAL_COMMUNITY): Payer: Medicare Other | Admitting: Physical Therapy

## 2015-01-08 DIAGNOSIS — R29898 Other symptoms and signs involving the musculoskeletal system: Secondary | ICD-10-CM | POA: Diagnosis not present

## 2015-01-08 DIAGNOSIS — R262 Difficulty in walking, not elsewhere classified: Secondary | ICD-10-CM | POA: Diagnosis not present

## 2015-01-08 DIAGNOSIS — G8929 Other chronic pain: Secondary | ICD-10-CM | POA: Diagnosis not present

## 2015-01-08 DIAGNOSIS — R2689 Other abnormalities of gait and mobility: Secondary | ICD-10-CM

## 2015-01-08 DIAGNOSIS — M25552 Pain in left hip: Secondary | ICD-10-CM | POA: Diagnosis not present

## 2015-01-08 DIAGNOSIS — R269 Unspecified abnormalities of gait and mobility: Secondary | ICD-10-CM

## 2015-01-08 NOTE — Therapy (Signed)
[facility] [address] [place], [place], [address] Phone: [phone]   Fax:  [phone]  Physical Therapy Treatment  Patient Details  Name: Robert Durham MRN: [id number] Date of Birth: 13-Oct-1955 Referring Provider:  Rosanne Ashing, MD  Encounter Date: 01/08/2015      PT End of Session - 01/08/15 1453    Visit Number 4   Number of Visits 16   Date for PT Re-Evaluation 01/23/15   Authorization Type medicare   PT Start Time 0847   PT Stop Time 0928   PT Time Calculation (min) 41 min   Activity Tolerance Patient tolerated treatment well   Behavior During Therapy [facility] for tasks assessed/performed      Past Medical History  Diagnosis Date  . Hypertension   . Depression   . Urinary retention   . Diabetes mellitus   . Chronic back pain   . Chronic hip pain   . Diverticulitis   . H/O kidney transplant   . Renal disorder   . Kidney stone     Past Surgical History  Procedure Laterality Date  . Kidney transplant    . Hernia repair    . Nephrectomy transplanted organ    . Colonoscopy  05/27/2003    WPY:[id number] terminal ileum,rectum, and colon    There were no vitals filed for this visit.  Visit Diagnosis:  Hip pain, chronic, left  Difficulty walking  Weakness of left hip  Unstable balance  Abnormality of gait      Subjective Assessment - 01/08/15 1451    Subjective Patient reports he is continuing to have quite a bit of pain today; recently had to go to [facility] due to possible blood clot but all tests came back negative for clot per patient   Pertinent History HTN, Kidney transplant.     Currently in Pain? Yes   Pain Score 7    Pain Location Hip                         [facility] Adult PT Treatment/Exercise - 01/08/15 0001    Ambulation/Gait   Ambulation/Gait Yes   Ambulation/Gait Assistance 5: Supervision   Ambulation Distance (Feet) 675 Feet   Assistive device Straight cane   Gait Pattern  Decreased step length - right;Decreased stance time - left  IR of BLE   Knee/Hip Exercises: Stretches   Gastroc Stretch Both;3 reps;30 seconds   Gastroc Stretch Limitations slantboard    Knee/Hip Exercises: Standing   Heel Raises 15 reps   Forward Lunges Both;10 reps   Forward Lunges Limitations 4" box   Hip Abduction Both;1 set;15 reps   Hip Extension Both;1 set;15 reps   Forward Step Up Both;1 set;10 reps   Forward Step Up Limitations attempted with L however termianted task due to pain today     Rocker Board Limitations x40AP, U HHA    Knee/Hip Exercises: Seated   Long Arc Quad 15 reps   Other Seated Knee/Hip Exercises ankle pumps 1x20; seated wheelchair hamstring pulls x266ft and seated WC quad pushes with resistance 3x45ft    Sit to [employer] 10 reps   Knee/Hip Exercises: Supine   Short Arc Quad Sets Both;1 set;15 reps   Bridges Limitations 15 reps    Straight Leg Raises Both;1 set;10 reps   Knee/Hip Exercises: Sidelying   Hip ABduction 1 set;Left;10 reps                PT  Education - 01/08/15 1452    Education provided Yes   Education Details reviewed hip precautions   Person(s) Educated Patient   Methods Explanation   Comprehension Verbalized understanding          PT Short Term Goals - 12/24/14 0935    PT SHORT TERM GOAL #1   Title I HEP    Time 1   Period Weeks   Status New   PT SHORT TERM GOAL #2   Title Pt Pain to be no greater than a 5/10 80% of the day.    Time 3   Period Weeks   Status New   PT SHORT TERM GOAL #3   Title Pt to be ambulating for short distances without his cane inside, ie bedroom to bathroom   Time 3   Period Weeks   Status New   PT SHORT TERM GOAL #4   Title Pt to be able to sit for 30 mintues without increased pain to enjoy eating a meal at the table   Time 3   Period Weeks   Status New   PT SHORT TERM GOAL #5   Title Pt to be able to stand for 10 mintues without difficulty to perform grooming activity    Time 3   Period  Weeks   Status New           PT Long Term Goals - 12/24/14 0936    PT LONG TERM GOAL #1   Title I in advance HEP   Time 6   Period Weeks   Status New   PT LONG TERM GOAL #2   Title Pain level to be no greater than a 2/10 80% of the day    Time 6   Period Weeks   Status New   PT LONG TERM GOAL #3   Title Pt to be able sit for two hours to be able to socialize    Time 6   Period Weeks   PT LONG TERM GOAL #4   Title Pt to be able to walk without an assistive device inside and outside    Time 6   Period Weeks   Status New   PT LONG TERM GOAL #5   Title Pt to be able to walk for 20 mintues for better health habits   Time 6   Period Weeks   Status New               Plan - 01/08/15 1453    Clinical Impression Statement Focused on functional strengthening and gait mechanics today. Patient antalgic today and did require cues from PT to maintain hip precautions during gait and static stance during session. Unable to complete step up exercise today due to pain in affected LE. Patient continues to appear to be limited by significant levels of pain at this time; he reports that he was recently checked for blood clot and all tests were negative right now. Recommend monitoring pain as patient progresses with skilled PT services.    Pt will benefit from skilled therapeutic intervention in order to improve on the following deficits Abnormal gait;Decreased activity tolerance;Decreased balance;Difficulty walking;Decreased strength;Pain   Rehab Potential Good   PT Frequency 3x / week   PT Duration 6 weeks   PT Treatment/Interventions ADLs/Self Care Home Management;Gait training;Stair training;Functional mobility training;Therapeutic activities;Therapeutic exercise;Balance training;Patient/family education   PT Next Visit Plan Encourage all activities with decreased UE assistance.  Begin stepping over hurdles to improve foot clearance. Reinforce hip  precautions.    PT Home Exercise Plan  given   Consulted and Agree with Plan of Care Patient        Problem List There are no active problems to display for this patient.   Deniece Ree PT, DPT [phone]  [facility] [address] [place], [place], [address] Phone: [phone]   Fax:  [phone]

## 2015-01-14 ENCOUNTER — Ambulatory Visit (HOSPITAL_COMMUNITY): Payer: Medicare Other | Attending: Adult Reconstructive Orthopaedic Surgery | Admitting: Physical Therapy

## 2015-01-14 DIAGNOSIS — R2689 Other abnormalities of gait and mobility: Secondary | ICD-10-CM

## 2015-01-14 DIAGNOSIS — G8929 Other chronic pain: Secondary | ICD-10-CM

## 2015-01-14 DIAGNOSIS — R262 Difficulty in walking, not elsewhere classified: Secondary | ICD-10-CM

## 2015-01-14 DIAGNOSIS — M25552 Pain in left hip: Secondary | ICD-10-CM | POA: Insufficient documentation

## 2015-01-14 DIAGNOSIS — R29898 Other symptoms and signs involving the musculoskeletal system: Secondary | ICD-10-CM | POA: Diagnosis not present

## 2015-01-14 DIAGNOSIS — R269 Unspecified abnormalities of gait and mobility: Secondary | ICD-10-CM

## 2015-01-14 NOTE — Therapy (Signed)
[facility] [address] [place], [place], [address] Phone: [phone]   Fax:  [phone]  Physical Therapy Treatment  Patient Details  Name: Robert Durham MRN: [id number] Date of Birth: 1955-08-18 Referring Provider:  Rosanne Ashing, MD  Encounter Date: 01/14/2015      PT End of Session - 01/14/15 0931    Visit Number 5   Number of Visits 16   Date for PT Re-Evaluation 01/23/15   Authorization Type medicare   PT Start Time 0855   PT Stop Time 0947   PT Time Calculation (min) 52 min   Equipment Utilized During Treatment Gait belt   Activity Tolerance Patient tolerated treatment well      Past Medical History  Diagnosis Date  . Hypertension   . Depression   . Urinary retention   . Diabetes mellitus   . Chronic back pain   . Chronic hip pain   . Diverticulitis   . H/O kidney transplant   . Renal disorder   . Kidney stone     Past Surgical History  Procedure Laterality Date  . Kidney transplant    . Hernia repair    . Nephrectomy transplanted organ    . Colonoscopy  05/27/2003    NIO:EVOJJK terminal ileum,rectum, and colon    There were no vitals filed for this visit.  Visit Diagnosis:  Hip pain, chronic, left  Difficulty walking  Weakness of left hip  Unstable balance  Abnormality of gait      Subjective Assessment - 01/14/15 0834    Subjective Pt states he is having a good day, walking is easier. He is still having difficulty walking.    Currently in Pain? No/denies                  [facility] Adult PT Treatment/Exercise - 01/14/15 0001    Knee/Hip Exercises: Aerobic   [place] L4 x 15:00   Knee/Hip Exercises: Standing   Heel Raises 15 reps   Heel Raises Limitations with blue ball and functional squats    Forward Lunges Left;15 reps   Forward Lunges Limitations 3# in B UE goes into flexion   Side Lunges Left;15 reps   Side Lunges Limitations 3# B UE goes into abduction.    Lateral Step  Up Left;15 reps   Forward Step Up Left;15 reps;Hand Hold: 0;Step Height: 6"   Functional Squat 15 reps   Stairs 7" x 3    SLS with Vectors 3 x with 10" holds   Knee/Hip Exercises: Sidelying   Hip ABduction 15 reps   Manual Therapy   Manual Therapy Soft tissue mobilization   Manual therapy comments for scar massage                   PT Short Term Goals - 12/24/14 0935    PT SHORT TERM GOAL #1   Title I HEP    Time 1   Period Weeks   Status New   PT SHORT TERM GOAL #2   Title Pt Pain to be no greater than a 5/10 80% of the day.    Time 3   Period Weeks   Status New   PT SHORT TERM GOAL #3   Title Pt to be ambulating for short distances without his cane inside, ie bedroom to bathroom   Time 3   Period Weeks   Status New   PT SHORT TERM GOAL #4   Title Pt to  be able to sit for 30 mintues without increased pain to enjoy eating a meal at the table   Time 3   Period Weeks   Status New   PT SHORT TERM GOAL #5   Title Pt to be able to stand for 10 mintues without difficulty to perform grooming activity    Time 3   Period Weeks   Status New           PT Long Term Goals - 12/24/14 0936    PT LONG TERM GOAL #1   Title I in advance HEP   Time 6   Period Weeks   Status New   PT LONG TERM GOAL #2   Title Pain level to be no greater than a 2/10 80% of the day    Time 6   Period Weeks   Status New   PT LONG TERM GOAL #3   Title Pt to be able sit for two hours to be able to socialize    Time 6   Period Weeks   PT LONG TERM GOAL #4   Title Pt to be able to walk without an assistive device inside and outside    Time 6   Period Weeks   Status New   PT LONG TERM GOAL #5   Title Pt to be able to walk for 20 mintues for better health habits   Time 6   Period Weeks   Status New               Plan - 01/14/15 0931    Clinical Impression Statement Added steps and lunges with weight as well as SLS with vectors to program.  Manual shows no edema or spasm  but significant adhesions under scar.  Pt instructed to complete scar massage at home.;   Pt will benefit from skilled therapeutic intervention in order to improve on the following deficits Abnormal gait;Decreased activity tolerance;Decreased balance;Difficulty walking;Decreased strength;Pain   PT Treatment/Interventions ADLs/Self Care Home Management;Gait training;Stair training;Functional mobility training;Therapeutic activities;Therapeutic exercise;Balance training;Patient/family education   PT Next Visit Plan begin hurdles this was not completed due to therapist wanting to examin scar secondary to pt complaint of a "Knot ".  No knot found but increased adhesions noted         Problem List There are no active problems to display for this patient.   Rayetta Humphrey, PT CLT [phone] 01/14/2015, 9:36 AM  [facility] [place], [place], [address] Phone: [phone]   Fax:  [phone]

## 2015-01-15 ENCOUNTER — Ambulatory Visit (HOSPITAL_COMMUNITY): Payer: Medicare Other

## 2015-01-15 DIAGNOSIS — R262 Difficulty in walking, not elsewhere classified: Secondary | ICD-10-CM | POA: Diagnosis not present

## 2015-01-15 DIAGNOSIS — G8929 Other chronic pain: Secondary | ICD-10-CM

## 2015-01-15 DIAGNOSIS — R269 Unspecified abnormalities of gait and mobility: Secondary | ICD-10-CM

## 2015-01-15 DIAGNOSIS — R2689 Other abnormalities of gait and mobility: Secondary | ICD-10-CM

## 2015-01-15 DIAGNOSIS — M25552 Pain in left hip: Principal | ICD-10-CM

## 2015-01-15 DIAGNOSIS — R29898 Other symptoms and signs involving the musculoskeletal system: Secondary | ICD-10-CM | POA: Diagnosis not present

## 2015-01-15 NOTE — Therapy (Signed)
[facility] [address] [place], [place], [address] Phone: [phone]   Fax:  [phone]  Physical Therapy Treatment  Patient Details  Name: Robert Durham MRN: [id number] Date of Birth: Aug 17, 1955 Referring Provider:  Rosanne Ashing, MD  Encounter Date: 01/15/2015      PT End of Session - 01/15/15 0931    Visit Number 6   Number of Visits 16   Date for PT Re-Evaluation 01/23/15   Authorization Type medicare   PT Start Time 0848   PT Stop Time 0930   PT Time Calculation (min) 42 min   Equipment Utilized During Treatment Gait belt   Activity Tolerance Patient tolerated treatment well   Behavior During Therapy [facility] for tasks assessed/performed      Past Medical History  Diagnosis Date  . Hypertension   . Depression   . Urinary retention   . Diabetes mellitus   . Chronic back pain   . Chronic hip pain   . Diverticulitis   . H/O kidney transplant   . Renal disorder   . Kidney stone     Past Surgical History  Procedure Laterality Date  . Kidney transplant    . Hernia repair    . Nephrectomy transplanted organ    . Colonoscopy  05/27/2003    ONG:EXBMWU terminal ileum,rectum, and colon    There were no vitals filed for this visit.  Visit Diagnosis:  Hip pain, chronic, left  Difficulty walking  Weakness of left hip  Unstable balance  Abnormality of gait      Subjective Assessment - 01/15/15 0906    Subjective Pt stated he was is feeling good today, no denies any pain just soreness over incision   Currently in Pain? No/denies   Pain Location Hip   Pain Orientation Left   Pain Descriptors / Indicators Sore                         [facility] Adult PT Treatment/Exercise - 01/15/15 0001    Exercises   Exercises Knee/Hip   Knee/Hip Exercises: Aerobic   Nustep unavailable, resume next session   Knee/Hip Exercises: Standing   Heel Raises 15 reps   Heel Raises Limitations with blue ball and  functional squats    Forward Lunges Left;15 reps   Forward Lunges Limitations 3# in B UE goes into flexion   Side Lunges Left;15 reps   Side Lunges Limitations 3# B UE goes into abduction.    Lateral Step Up Left;15 reps;Hand Hold: 2;Step Height: 6"   Forward Step Up Left;15 reps;Hand Hold: 0;Step Height: 6"   Step Down Left;15 reps;Hand Hold: 0;Step Height: 6"   Stairs 7 in height ascend and descend reciprocal 2 set with 1 HHA, 2 sets with no HHA cueing for eccentric control   SLS Rt 36", Lt 30" max of 3   SLS with Vectors 3 x with 10" holds   Gait Training [phone] heelto    Other Standing Knee Exercises Tandem stance 2x 30" no HHA   Other Standing Knee Exercises 6 and 12 in hurdles forwards and sidestepping 2RT   Knee/Hip Exercises: Sidelying   Hip ABduction 15 reps   Manual Therapy   Manual Therapy Soft tissue mobilization   Manual therapy comments for scar massage                   PT Short Term Goals - 01/15/15 1027    PT  SHORT TERM GOAL #1   Title I HEP    Status On-going   PT SHORT TERM GOAL #2   Title Pt Pain to be no greater than a 5/10 80% of the day.    Status On-going   PT SHORT TERM GOAL #3   Title Pt to be ambulating for short distances without his cane inside, ie bedroom to bathroom   Status On-going   PT SHORT TERM GOAL #4   Title Pt to be able to sit for 30 mintues without increased pain to enjoy eating a meal at the table   Status On-going   PT SHORT TERM GOAL #5   Title Pt to be able to stand for 10 mintues without difficulty to perform grooming activity    Status On-going           PT Long Term Goals - 01/15/15 0814    PT LONG TERM GOAL #1   Title I in advance HEP   PT LONG TERM GOAL #2   Title Pain level to be no greater than a 2/10 80% of the day    PT LONG TERM GOAL #3   Title Pt to be able sit for two hours to be able to socialize    PT LONG TERM GOAL #4   Title Pt to be able to walk without an assistive device inside and outside     PT LONG TERM GOAL #5   Title Pt to be able to walk for 20 mintues for better health habits               Plan - 01/15/15 0931    Clinical Impression Statement Session focus on incorporating balance and functional strengthening.  Began hurdle training to improve hip musculature strengthening and SLS activities.  Therapist facilitation for proper form and technique with all exercises, espeically the squats.  Continued manual technqiues to reduce adhesions over incision Lt hip.  No reports of pain through session.     PT Next Visit Plan Added balance beam with hurdles and progress dynamic balance activities next session.  Manual as needed to reduce adhesions.        Problem List There are no active problems to display for this patient.  [address], LPTA; [place]  Aldona Lento 01/15/2015, 9:40 AM  [facility] [place], [place], [address] Phone: [phone]   Fax:  [phone]

## 2015-01-16 DIAGNOSIS — Z940 Kidney transplant status: Secondary | ICD-10-CM | POA: Diagnosis not present

## 2015-01-16 DIAGNOSIS — K219 Gastro-esophageal reflux disease without esophagitis: Secondary | ICD-10-CM | POA: Diagnosis not present

## 2015-01-16 DIAGNOSIS — Z79899 Other long term (current) drug therapy: Secondary | ICD-10-CM | POA: Diagnosis not present

## 2015-01-16 DIAGNOSIS — Z4822 Encounter for aftercare following kidney transplant: Secondary | ICD-10-CM | POA: Diagnosis not present

## 2015-01-16 DIAGNOSIS — M25552 Pain in left hip: Secondary | ICD-10-CM | POA: Diagnosis not present

## 2015-01-16 DIAGNOSIS — Z9889 Other specified postprocedural states: Secondary | ICD-10-CM | POA: Diagnosis not present

## 2015-01-16 DIAGNOSIS — Z87442 Personal history of urinary calculi: Secondary | ICD-10-CM | POA: Diagnosis not present

## 2015-01-16 DIAGNOSIS — Z992 Dependence on renal dialysis: Secondary | ICD-10-CM | POA: Diagnosis not present

## 2015-01-16 DIAGNOSIS — Z7952 Long term (current) use of systemic steroids: Secondary | ICD-10-CM | POA: Diagnosis not present

## 2015-01-16 DIAGNOSIS — Z87898 Personal history of other specified conditions: Secondary | ICD-10-CM | POA: Diagnosis not present

## 2015-01-16 DIAGNOSIS — D631 Anemia in chronic kidney disease: Secondary | ICD-10-CM | POA: Diagnosis not present

## 2015-01-16 DIAGNOSIS — Z87891 Personal history of nicotine dependence: Secondary | ICD-10-CM | POA: Diagnosis not present

## 2015-01-16 DIAGNOSIS — I120 Hypertensive chronic kidney disease with stage 5 chronic kidney disease or end stage renal disease: Secondary | ICD-10-CM | POA: Diagnosis not present

## 2015-01-16 DIAGNOSIS — Z96642 Presence of left artificial hip joint: Secondary | ICD-10-CM | POA: Diagnosis not present

## 2015-01-16 DIAGNOSIS — N186 End stage renal disease: Secondary | ICD-10-CM | POA: Diagnosis not present

## 2015-01-17 ENCOUNTER — Ambulatory Visit (HOSPITAL_COMMUNITY): Payer: Medicare Other | Admitting: Physical Therapy

## 2015-01-17 DIAGNOSIS — R2689 Other abnormalities of gait and mobility: Secondary | ICD-10-CM

## 2015-01-17 DIAGNOSIS — M25552 Pain in left hip: Principal | ICD-10-CM

## 2015-01-17 DIAGNOSIS — G8929 Other chronic pain: Secondary | ICD-10-CM

## 2015-01-17 DIAGNOSIS — R29898 Other symptoms and signs involving the musculoskeletal system: Secondary | ICD-10-CM | POA: Diagnosis not present

## 2015-01-17 DIAGNOSIS — R262 Difficulty in walking, not elsewhere classified: Secondary | ICD-10-CM | POA: Diagnosis not present

## 2015-01-17 DIAGNOSIS — R269 Unspecified abnormalities of gait and mobility: Secondary | ICD-10-CM

## 2015-01-17 NOTE — Therapy (Signed)
[place] [address] [place], [place], [address] Phone: [phone]   Fax:  [phone]  Physical Therapy Treatment  Patient Details  Name: Robert Durham MRN: [id number] Date of Birth: 03/03/1956 Referring Provider:  Rosanne Ashing, MD  Encounter Date: 01/17/2015      PT End of Session - 01/17/15 0913    Visit Number 7   Number of Visits 7   Authorization Type medicare   PT Start Time 0850   PT Stop Time 0928   PT Time Calculation (min) 38 min   Activity Tolerance Patient tolerated treatment well   Behavior During Therapy [facility] for tasks assessed/performed      Past Medical History  Diagnosis Date  . Hypertension   . Depression   . Urinary retention   . Diabetes mellitus   . Chronic back pain   . Chronic hip pain   . Diverticulitis   . H/O kidney transplant   . Renal disorder   . Kidney stone     Past Surgical History  Procedure Laterality Date  . Kidney transplant    . Hernia repair    . Nephrectomy transplanted organ    . Colonoscopy  05/27/2003    HWE:XHBZJI terminal ileum,rectum, and colon    There were no vitals filed for this visit.  Visit Diagnosis:  Hip pain, chronic, left  Difficulty walking  Unstable balance  Weakness of left hip  Abnormality of gait      Subjective Assessment - 01/17/15 0906    Subjective Robert Durham states he is feeling better each day.     Pertinent History HTN, Kidney transplant.     How long can you stand comfortably? Pt is no longer using his walker and he  is able to stand for an hour.    How long can you walk comfortably? Pt is walking without his cane inside and occasionally outside.  He has not walked due to weather being hot therefore the longest he has gone is ten minutes             [facility] PT Assessment - 01/17/15 0001    Assessment   Medical Diagnosis  Lt THR   Onset Date/Surgical Date 12/20/14   Next MD Visit 01/30/2015   Precautions   Precautions  Posterior Hip;Fall   Restrictions   Weight Bearing Restrictions No   Home Ecologist residence   Living Arrangements Spouse/significant other   Type of [place] to enter   Entrance Stairs-Number of Steps 2   Entrance Stairs-Rails Right   [place] One level   Prior Function   Level of Independence Independent with community mobility with device   Vocation On disability   Leisure hunting; fishing   Cognition   Overall Cognitive Status Within Functional Limits for tasks assessed   Observation/Other Assessments   Focus on Therapeutic Outcomes (FOTO)  69 was 2   Single Leg Stance   Comments Rt 60 seconds Lt 40 seconds    Strength   Left Hip Flexion 5/5  was 2-/5   Left Hip Extension 4-/5  was 2+/5   Left Hip ABduction 4+/5  was 2-/5    Left Hip ADduction 2+/5   Left Knee Flexion 5/5  was 4/5   Left Knee Extension 5/5  was 4-/5   Left Ankle Dorsiflexion 5/5  was 3+/5   Left Ankle Plantar Flexion 5/5   Ambulation/Gait  Ambulation/Gait Yes   Ambulation/Gait Assistance 6: Modified independent (Device/Increase time)   Ambulation Distance (Feet) 226 Feet   Assistive device Rolling walker   Gait Pattern Decreased step length - right;Decreased stance time - left   6 minute walk test results    Aerobic Endurance Distance Walked 286                     [facility] Adult PT Treatment/Exercise - 01/17/15 0001    Knee/Hip Exercises: Standing   Stairs x3   Rocker Board 2 minutes   SLS x 40"   SLS with Vectors 10" x 3    Gait Training no assistive device x 15 minutes.    Knee/Hip Exercises: Seated   Sit to Sand 10 reps   Knee/Hip Exercises: Prone   Hip Extension 10 reps                  PT Short Term Goals - 01/17/15 0908    PT SHORT TERM GOAL #1   Title I HEP    Time 1   Period Weeks   Status Achieved   PT SHORT TERM GOAL #2   Title Pt Pain to be no greater than a 5/10 80% of the day.    Time 3    Period Weeks   Status Achieved   PT SHORT TERM GOAL #3   Title Pt to be ambulating for short distances without his cane inside, ie bedroom to bathroom   Time 3   Period Weeks   Status Achieved   PT SHORT TERM GOAL #4   Title Pt to be able to sit for 30 mintues without increased pain to enjoy eating a meal at the table   Time 3   Period Weeks   Status Achieved   PT SHORT TERM GOAL #5   Title Pt to be able to stand for 10 mintues without difficulty to perform grooming activity    Time 3   Period Weeks   Status Achieved           PT Long Term Goals - 01/17/15 0908    PT LONG TERM GOAL #1   Title I in advance HEP   Time 6   Period Weeks   Status Achieved   PT LONG TERM GOAL #2   Title Pain level to be no greater than a 2/10 80% of the day    Period Weeks   Status Achieved   PT LONG TERM GOAL #3   Title Pt to be able sit for two hours to be able to socialize    Time 6   Period Weeks   Status Achieved   PT LONG TERM GOAL #4   Title Pt to be able to walk without an assistive device inside and outside    Time 6   Period Weeks   Status Partially Met   PT LONG TERM GOAL #5   Title Pt to be able to walk for 20 mintues for better health habits   Time 6   Period Weeks               Plan - 01/17/15 7517    Clinical Impression Statement Robert Durham states he is doing very well; better than prior to his surgery.  Pt has improved in all aspects and states he is having no difficulty in any functional activity at home.  Pt reassessed and is no longer in need of skilled care.  PT Next Visit Plan discharge.           G-Codes - 02/09/2015 2080    Functional Assessment Tool Used foto   Functional Limitation Mobility: Walking and moving around   Mobility: Walking and Moving Around Current Status [phone]) At least 20 percent but less than 40 percent impaired, limited or restricted   Mobility: Walking and Moving Around Goal Status [phone]) At least 40 percent but less than  60 percent impaired, limited or restricted      Problem List There are no active problems to display for this patient.   Rayetta Humphrey, PT CLT [phone] 02/09/2015, 9:31 AM  [place], [place], [address] Phone: [phone]   Fax:  [phone]  PHYSICAL THERAPY DISCHARGE SUMMARY  Visits from Start of Care: 7  Current functional level related to goals / functional outcomes: Doing everything he wants    Remaining deficits: none   Education / Equipment: Join [employer]/ walk 30 minutes per day.   Plan: Patient agrees to discharge.  Patient goals were met. Patient is being discharged due to meeting the stated rehab goals.  ?????   Rayetta Humphrey, [place] CLT [phone]

## 2015-01-20 ENCOUNTER — Encounter (HOSPITAL_COMMUNITY): Payer: Medicare Other | Admitting: Physical Therapy

## 2015-01-22 ENCOUNTER — Encounter (HOSPITAL_COMMUNITY): Payer: Medicare Other | Admitting: Physical Therapy

## 2015-01-24 ENCOUNTER — Encounter (HOSPITAL_COMMUNITY): Payer: Medicare Other

## 2015-01-27 ENCOUNTER — Emergency Department (HOSPITAL_COMMUNITY): Payer: Medicare Other

## 2015-01-27 ENCOUNTER — Emergency Department (HOSPITAL_COMMUNITY)
Admission: EM | Admit: 2015-01-27 | Discharge: 2015-01-27 | Disposition: A | Discharge status: Home / Self Care | Payer: Medicare Other | Attending: Emergency Medicine | Admitting: Emergency Medicine

## 2015-01-27 ENCOUNTER — Encounter (HOSPITAL_COMMUNITY): Payer: Self-pay | Admitting: Emergency Medicine

## 2015-01-27 DIAGNOSIS — Z940 Kidney transplant status: Secondary | ICD-10-CM | POA: Diagnosis not present

## 2015-01-27 DIAGNOSIS — Z87442 Personal history of urinary calculi: Secondary | ICD-10-CM | POA: Insufficient documentation

## 2015-01-27 DIAGNOSIS — Z79899 Other long term (current) drug therapy: Secondary | ICD-10-CM | POA: Insufficient documentation

## 2015-01-27 DIAGNOSIS — Z87891 Personal history of nicotine dependence: Secondary | ICD-10-CM | POA: Diagnosis not present

## 2015-01-27 DIAGNOSIS — Z8659 Personal history of other mental and behavioral disorders: Secondary | ICD-10-CM | POA: Diagnosis not present

## 2015-01-27 DIAGNOSIS — K5900 Constipation, unspecified: Secondary | ICD-10-CM | POA: Diagnosis not present

## 2015-01-27 DIAGNOSIS — R109 Unspecified abdominal pain: Secondary | ICD-10-CM

## 2015-01-27 DIAGNOSIS — R1032 Left lower quadrant pain: Secondary | ICD-10-CM | POA: Diagnosis not present

## 2015-01-27 DIAGNOSIS — Z87448 Personal history of other diseases of urinary system: Secondary | ICD-10-CM | POA: Diagnosis not present

## 2015-01-27 DIAGNOSIS — E119 Type 2 diabetes mellitus without complications: Secondary | ICD-10-CM | POA: Insufficient documentation

## 2015-01-27 DIAGNOSIS — G8929 Other chronic pain: Secondary | ICD-10-CM | POA: Diagnosis not present

## 2015-01-27 DIAGNOSIS — I10 Essential (primary) hypertension: Secondary | ICD-10-CM | POA: Diagnosis not present

## 2015-01-27 LAB — BASIC METABOLIC PANEL
Anion gap: 7 (ref 5–15)
BUN: 15 mg/dL (ref 6–20)
CALCIUM: 9.1 mg/dL (ref 8.9–10.3)
CHLORIDE: 103 mmol/L (ref 101–111)
CO2: 26 mmol/L (ref 22–32)
Creatinine, Ser: 1.24 mg/dL (ref 0.61–1.24)
GFR calc non Af Amer: >60 mL/min (ref >60)
Glucose, Bld: 94 mg/dL (ref 65–99)
POTASSIUM: 3.7 mmol/L (ref 3.5–5.1)
Sodium: 136 mmol/L (ref 135–145)

## 2015-01-27 LAB — CBC WITH DIFFERENTIAL/PLATELET
BASOS ABS: 0 10*3/uL (ref 0.0–0.1)
Basophils Relative: 0 % (ref 0–1)
Eosinophils Absolute: 0.3 10*3/uL (ref 0.0–0.7)
Eosinophils Relative: 4 % (ref 0–5)
HEMATOCRIT: 35.6 % — AB (ref 39.0–52.0)
HEMOGLOBIN: 11.4 g/dL — AB (ref 13.0–17.0)
LYMPHS PCT: 31 % (ref 12–46)
Lymphs Abs: 2.3 10*3/uL (ref 0.7–4.0)
MCH: 22.6 pg — ABNORMAL LOW (ref 26.0–34.0)
MCHC: 32 g/dL (ref 30.0–36.0)
MCV: 70.5 fL — AB (ref 78.0–100.0)
Monocytes Absolute: 0.6 10*3/uL (ref 0.1–1.0)
Monocytes Relative: 8 % (ref 3–12)
NEUTROS ABS: 4.2 10*3/uL (ref 1.7–7.7)
Neutrophils Relative %: 56 % (ref 43–77)
Platelets: 163 10*3/uL (ref 150–400)
RBC: 5.05 MIL/uL (ref 4.22–5.81)
RDW: 15.5 % (ref 11.5–15.5)
WBC: 7.4 10*3/uL (ref 4.0–10.5)

## 2015-01-27 MED ORDER — LACTULOSE 10 GM/15ML PO SOLN
10.0000 g | Freq: Once | ORAL | Status: AC
  Administered 2015-01-27: 10 g via ORAL
  Filled 2015-01-27: qty 30

## 2015-01-27 MED ORDER — OXYCODONE-ACETAMINOPHEN 5-325 MG PO TABS
1.0000 | ORAL_TABLET | Freq: Once | ORAL | Status: AC
Start: 2015-01-27 — End: 2015-01-27
  Administered 2015-01-27: 1 via ORAL
  Filled 2015-01-27: qty 1

## 2015-01-27 NOTE — ED Provider Notes (Signed)
CSN: [id number]     Arrival date & time 01/27/15  9924 History   First MD Initiated Contact with Patient 01/27/15 [phone]     Chief Complaint  Patient presents with  . Flank Pain     (Consider location/radiation/quality/duration/timing/severity/associated sxs/prior Treatment) Patient is a 59 y.o. male presenting with flank pain. The history is provided by the patient.  Flank Pain This is a chronic problem. The current episode started in the past 7 days. The problem occurs intermittently. The problem has been gradually worsening. Associated symptoms include arthralgias and nausea. Pertinent negatives include no coughing or fever. Associated symptoms comments: Poor appetite and poor sleeping. Exacerbated by: movement and palpation. He has tried oral narcotics for the symptoms. The treatment provided mild relief.    Past Medical History  Diagnosis Date  . Hypertension   . Depression   . Urinary retention   . Diabetes mellitus   . Chronic back pain   . Chronic hip pain   . Diverticulitis   . H/O kidney transplant   . Renal disorder   . Kidney stone    Past Surgical History  Procedure Laterality Date  . Kidney transplant    . Hernia repair    . Nephrectomy transplanted organ    . Colonoscopy  05/27/2003    MHD:[id number] terminal ileum,rectum, and colon  . Hip surgery     Family History  Problem Relation Age of Onset  . Hypertension Sister    Social History  Substance Use Topics  . Smoking status: Former Smoker -- 1.00 packs/day for 5 years    Types: Cigarettes    Quit date: 06/14/1972  . Smokeless tobacco: Never Used  . Alcohol Use: 0.6 oz/week    1 Cans of beer per week     Comment: occasionally    Review of Systems  Constitutional: Positive for activity change and appetite change. Negative for fever.  Respiratory: Negative for cough.   Gastrointestinal: Positive for nausea.  Genitourinary: Positive for flank pain.  Musculoskeletal: Positive for back pain and  arthralgias.  All other systems reviewed and are negative.     Allergies  Codeine and Vasotec  Home Medications   Prior to Admission medications   Medication Sig Start Date End Date Taking? Authorizing Provider  atorvastatin (LIPITOR) 10 MG tablet Take 10 mg by mouth every morning.     Historical Provider, MD  cycloSPORINE (SANDIMMUNE) 100 MG capsule Take 100 mg by mouth 2 (two) times daily. Take with 25 mg tablet to equal 125 mg twice daily    Historical Provider, MD  cycloSPORINE (SANDIMMUNE) 25 MG capsule Take 25 mg by mouth 2 (two) times daily. Take with 100 mg tablet to equal dose of 125 mg twice daily    Historical Provider, MD  esomeprazole (NEXIUM) 40 MG capsule Take 40 mg by mouth 2 (two) times daily.     Historical Provider, MD  HYDROcodone-acetaminophen (NORCO) 5-325 MG per tablet Take 1 tablet by mouth every 4 (four) hours as needed. 10/25/14   Daleen Bo, MD  Multiple Vitamin (MULTIVITAMIN WITH MINERALS) TABS tablet Take 1 tablet by mouth daily.    Historical Provider, MD  Multiple Vitamin (MULTIVITAMIN) tablet Take 1 tablet by mouth daily.    Historical Provider, MD  oxycodone (OXY-IR) 5 MG capsule Take 5 mg by mouth every 4 (four) hours as needed (takes 1-3 tablets prn pain,).    Historical Provider, MD  predniSONE (DELTASONE) 5 MG tablet Take 5 mg by mouth every morning.  Historical Provider, MD  sennosides-docusate sodium (SENOKOT-S) 8.6-50 MG tablet Take 2 tablets by mouth 2 (two) times daily.    Historical Provider, MD  tamsulosin (FLOMAX) 0.4 MG CAPS capsule Take 0.4 mg by mouth daily.     Historical Provider, MD   There were no vitals taken for this visit. Physical Exam  Constitutional: He is oriented to person, place, and time. He appears well-developed and well-nourished.  Non-toxic appearance.  HENT:  Head: Normocephalic.  Right Ear: Tympanic membrane and external ear normal.  Left Ear: Tympanic membrane and external ear normal.  Eyes: EOM and lids are  normal. Pupils are equal, round, and reactive to light.  Neck: Normal range of motion. Neck supple. Carotid bruit is not present.  Cardiovascular: Normal rate, regular rhythm, normal heart sounds, intact distal pulses and normal pulses.   Pulmonary/Chest: Breath sounds normal. No respiratory distress.  Abdominal: Soft. Bowel sounds are normal. He exhibits no distension, no ascites and no mass. There is no splenomegaly or hepatomegaly. There is no tenderness. There is no guarding.    Pain with movement and palpation of the left flank extending to the  CVA and posterior rib area.  Musculoskeletal: Normal range of motion.  Lymphadenopathy:       Head (right side): No submandibular adenopathy present.       Head (left side): No submandibular adenopathy present.    He has no cervical adenopathy.  Neurological: He is alert and oriented to person, place, and time. He has normal strength. No cranial nerve deficit or sensory deficit.  Skin: Skin is warm and dry.  Psychiatric: He has a normal mood and affect. His speech is normal.  Nursing note and vitals reviewed.   ED Course  Procedures (including critical care time) Labs Review Labs Reviewed - No data to display  Imaging Review No results found. Jeannetta Ellis, personally reviewed and evaluated these images and lab results as part of my medical decision-making.   EKG Interpretation None      MDM  The complete blood count shows the hemoglobin be slightly low at 11.4, and hematocrit low at 35.6. The remainder of the complete blood count is well within normal limits. The basic metabolic panel is well within normal limits. A three-way abdomen shows a large colonic stool burden, but no obstruction, and no acute findings in the chest.  Patient had a workup at [facility] a week ago and there was no evidence of any clots in the lung.  I suspect that the pain is a combination of problems from constipation and trapped  gas. There is also suggestion of referred pain from recent hip surgery. The patient is asked to use Dulcolax tablets and to continue his current fiber related medication. The patient is to see the primary physician or return to the emergency department if any changes, problems, or concerns.    Final diagnoses:  None    *I have reviewed nursing notes, vital signs, and all appropriate lab and imaging results for this patient.[address], PA-C 01/27/15 1646  Dorie Rank, MD 01/28/15 [phone]

## 2015-01-27 NOTE — Discharge Instructions (Signed)
Your x-ray reveals a large stool burden. Please use MiraLAX, and mineral oil enemas to assist with this evacuation. Please see Dr. Berdine Addison for additional evaluation if this problem continues. No other acute problem noted with your lab work or your examination. Constipation Constipation is when a person:  Poops (has a bowel movement) less than 3 times a week.  Has a hard time pooping.  Has poop that is dry, hard, or bigger than normal. HOME CARE   Eat foods with a lot of fiber in them. This includes fruits, vegetables, beans, and whole grains such as brown rice.  Avoid fatty foods and foods with a lot of sugar. This includes french fries, hamburgers, cookies, candy, and soda.  If you are not getting enough fiber from food, take products with added fiber in them (supplements).  Drink enough fluid to keep your pee (urine) clear or pale yellow.  Exercise on a regular basis, or as told by your doctor.  Go to the restroom when you feel like you need to poop. Do not hold it.  Only take medicine as told by your doctor. Do not take medicines that help you poop (laxatives) without talking to your doctor first. GET HELP RIGHT AWAY IF:   You have bright red blood in your poop (stool).  Your constipation lasts more than 4 days or gets worse.  You have belly (abdominal) or butt (rectal) pain.  You have thin poop (as thin as a pencil).  You lose weight, and it cannot be explained. MAKE SURE YOU:   Understand these instructions.  Will watch your condition.  Will get help right away if you are not doing well or get worse. Document Released: 11/17/2007 Document Revised: 06/05/2013 Document Reviewed: 03/12/2013 [facility] Patient Information 2015 [place], [place]. This information is not intended to replace advice given to you by your health care provider. Make sure you discuss any questions you have with your health care provider.

## 2015-01-27 NOTE — ED Notes (Signed)
[place], PA at bedside.  

## 2015-01-27 NOTE — ED Notes (Signed)
Pt made aware to return if symptoms worsen or if any life threatening symptoms occur.   

## 2015-01-27 NOTE — ED Notes (Signed)
Patient complaining of left flank pain x 2 days. States he has hip surgery recently but has been nauseated x 2 days. States "I just don't feel good."

## 2015-01-27 NOTE — ED Notes (Signed)
Pt also reports since he has had hip surgery, left leg will "just start shaking" out of nowhere.

## 2015-01-29 DIAGNOSIS — K5900 Constipation, unspecified: Secondary | ICD-10-CM | POA: Diagnosis not present

## 2015-01-29 DIAGNOSIS — K219 Gastro-esophageal reflux disease without esophagitis: Secondary | ICD-10-CM | POA: Diagnosis not present

## 2015-01-29 DIAGNOSIS — D649 Anemia, unspecified: Secondary | ICD-10-CM | POA: Diagnosis not present

## 2015-01-30 DIAGNOSIS — Z471 Aftercare following joint replacement surgery: Secondary | ICD-10-CM | POA: Diagnosis not present

## 2015-01-30 DIAGNOSIS — Z96642 Presence of left artificial hip joint: Secondary | ICD-10-CM | POA: Diagnosis not present

## 2015-01-30 DIAGNOSIS — M1711 Unilateral primary osteoarthritis, right knee: Secondary | ICD-10-CM | POA: Diagnosis not present

## 2015-01-30 DIAGNOSIS — Z87891 Personal history of nicotine dependence: Secondary | ICD-10-CM | POA: Diagnosis not present

## 2015-01-30 DIAGNOSIS — I10 Essential (primary) hypertension: Secondary | ICD-10-CM | POA: Diagnosis not present

## 2015-01-30 DIAGNOSIS — E785 Hyperlipidemia, unspecified: Secondary | ICD-10-CM | POA: Diagnosis not present

## 2015-01-30 DIAGNOSIS — Z940 Kidney transplant status: Secondary | ICD-10-CM | POA: Diagnosis not present

## 2015-01-30 DIAGNOSIS — M619 Calcification and ossification of muscle, unspecified: Secondary | ICD-10-CM | POA: Diagnosis not present

## 2015-02-04 ENCOUNTER — Encounter (HOSPITAL_COMMUNITY): Payer: Self-pay | Admitting: Emergency Medicine

## 2015-02-04 ENCOUNTER — Emergency Department (HOSPITAL_COMMUNITY)
Admission: EM | Admit: 2015-02-04 | Discharge: 2015-02-04 | Discharge status: Against Medical Advice | Payer: Medicare Other | Attending: Emergency Medicine | Admitting: Emergency Medicine

## 2015-02-04 DIAGNOSIS — I10 Essential (primary) hypertension: Secondary | ICD-10-CM | POA: Diagnosis not present

## 2015-02-04 DIAGNOSIS — E119 Type 2 diabetes mellitus without complications: Secondary | ICD-10-CM | POA: Insufficient documentation

## 2015-02-04 DIAGNOSIS — G8929 Other chronic pain: Secondary | ICD-10-CM | POA: Diagnosis not present

## 2015-02-04 DIAGNOSIS — R197 Diarrhea, unspecified: Secondary | ICD-10-CM | POA: Diagnosis not present

## 2015-02-04 DIAGNOSIS — R109 Unspecified abdominal pain: Secondary | ICD-10-CM | POA: Insufficient documentation

## 2015-02-04 DIAGNOSIS — M25552 Pain in left hip: Secondary | ICD-10-CM | POA: Diagnosis not present

## 2015-02-04 NOTE — ED Notes (Signed)
Received call from registration that pt had got up out of wheel chair and stated that he was leaving. Ambulated out of ED -

## 2015-02-04 NOTE — ED Notes (Signed)
Pt states that about 1 month ago he was admited with same - has been having abdo pain with nausea off and on since- Abdo pain with dry heaving today - with diarrhea- Pt also co lt hip pain

## 2015-02-04 NOTE — ED Notes (Signed)
No answer in waiting room 

## 2015-02-05 ENCOUNTER — Emergency Department (HOSPITAL_COMMUNITY)
Admission: EM | Admit: 2015-02-05 | Discharge: 2015-02-05 | Disposition: A | Discharge status: Home / Self Care | Payer: Medicare Other | Attending: Emergency Medicine | Admitting: Emergency Medicine

## 2015-02-05 ENCOUNTER — Encounter (HOSPITAL_COMMUNITY): Payer: Self-pay | Admitting: Emergency Medicine

## 2015-02-05 DIAGNOSIS — Z940 Kidney transplant status: Secondary | ICD-10-CM | POA: Diagnosis not present

## 2015-02-05 DIAGNOSIS — G8929 Other chronic pain: Secondary | ICD-10-CM | POA: Diagnosis not present

## 2015-02-05 DIAGNOSIS — Z87891 Personal history of nicotine dependence: Secondary | ICD-10-CM | POA: Insufficient documentation

## 2015-02-05 DIAGNOSIS — Z79899 Other long term (current) drug therapy: Secondary | ICD-10-CM | POA: Diagnosis not present

## 2015-02-05 DIAGNOSIS — M25552 Pain in left hip: Secondary | ICD-10-CM | POA: Insufficient documentation

## 2015-02-05 DIAGNOSIS — E119 Type 2 diabetes mellitus without complications: Secondary | ICD-10-CM | POA: Diagnosis not present

## 2015-02-05 DIAGNOSIS — Z87448 Personal history of other diseases of urinary system: Secondary | ICD-10-CM | POA: Diagnosis not present

## 2015-02-05 DIAGNOSIS — I10 Essential (primary) hypertension: Secondary | ICD-10-CM | POA: Diagnosis not present

## 2015-02-05 DIAGNOSIS — Z8659 Personal history of other mental and behavioral disorders: Secondary | ICD-10-CM | POA: Insufficient documentation

## 2015-02-05 DIAGNOSIS — Z87442 Personal history of urinary calculi: Secondary | ICD-10-CM | POA: Insufficient documentation

## 2015-02-05 DIAGNOSIS — K529 Noninfective gastroenteritis and colitis, unspecified: Secondary | ICD-10-CM | POA: Diagnosis not present

## 2015-02-05 DIAGNOSIS — Z96642 Presence of left artificial hip joint: Secondary | ICD-10-CM | POA: Diagnosis not present

## 2015-02-05 DIAGNOSIS — R109 Unspecified abdominal pain: Secondary | ICD-10-CM | POA: Diagnosis present

## 2015-02-05 LAB — COMPREHENSIVE METABOLIC PANEL
ALBUMIN: 3.7 g/dL (ref 3.5–5.0)
ALK PHOS: 50 U/L (ref 38–126)
ALT: 14 U/L — ABNORMAL LOW (ref 17–63)
ANION GAP: 6 (ref 5–15)
AST: 19 U/L (ref 15–41)
BUN: 16 mg/dL (ref 6–20)
CALCIUM: 9.3 mg/dL (ref 8.9–10.3)
CO2: 26 mmol/L (ref 22–32)
Chloride: 107 mmol/L (ref 101–111)
Creatinine, Ser: 1.36 mg/dL — ABNORMAL HIGH (ref 0.61–1.24)
GFR calc Af Amer: >60 mL/min (ref >60)
GFR, EST NON AFRICAN AMERICAN: 55 mL/min — AB (ref >60)
GLUCOSE: 93 mg/dL (ref 65–99)
Potassium: 3.6 mmol/L (ref 3.5–5.1)
Sodium: 139 mmol/L (ref 135–145)
TOTAL PROTEIN: 7.2 g/dL (ref 6.5–8.1)
Total Bilirubin: 0.6 mg/dL (ref 0.3–1.2)

## 2015-02-05 LAB — LIPASE, BLOOD: Lipase: 55 U/L — ABNORMAL HIGH (ref 22–51)

## 2015-02-05 LAB — CBC WITH DIFFERENTIAL/PLATELET
BASOS PCT: 0 % (ref 0–1)
Basophils Absolute: 0 10*3/uL (ref 0.0–0.1)
Eosinophils Absolute: 0.3 10*3/uL (ref 0.0–0.7)
Eosinophils Relative: 4 % (ref 0–5)
HEMATOCRIT: 35.6 % — AB (ref 39.0–52.0)
HEMOGLOBIN: 11.5 g/dL — AB (ref 13.0–17.0)
LYMPHS ABS: 1.8 10*3/uL (ref 0.7–4.0)
Lymphocytes Relative: 26 % (ref 12–46)
MCH: 22.7 pg — ABNORMAL LOW (ref 26.0–34.0)
MCHC: 32.3 g/dL (ref 30.0–36.0)
MCV: 70.4 fL — ABNORMAL LOW (ref 78.0–100.0)
Monocytes Absolute: 0.6 10*3/uL (ref 0.1–1.0)
Monocytes Relative: 8 % (ref 3–12)
NEUTROS PCT: 61 % (ref 43–77)
Neutro Abs: 4.1 10*3/uL (ref 1.7–7.7)
Platelets: 189 10*3/uL (ref 150–400)
RBC: 5.06 MIL/uL (ref 4.22–5.81)
RDW: 15.5 % (ref 11.5–15.5)
WBC: 6.7 10*3/uL (ref 4.0–10.5)

## 2015-02-05 LAB — I-STAT CG4 LACTIC ACID, ED: Lactic Acid, Venous: 1.54 mmol/L (ref 0.5–2.0)

## 2015-02-05 MED ORDER — ONDANSETRON 4 MG PO TBDP: ORAL_TABLET | ORAL | Status: DC

## 2015-02-05 MED ORDER — ONDANSETRON HCL 4 MG/2ML IJ SOLN
4.0000 mg | Freq: Once | INTRAMUSCULAR | Status: AC
  Administered 2015-02-05: 4 mg via INTRAVENOUS
  Filled 2015-02-05: qty 2

## 2015-02-05 MED ORDER — SODIUM CHLORIDE 0.9 % IV BOLUS (SEPSIS)
2000.0000 mL | Freq: Once | INTRAVENOUS | Status: AC
  Administered 2015-02-05: 2000 mL via INTRAVENOUS

## 2015-02-05 NOTE — ED Notes (Signed)
Pt reports right hip pain,n/v,body aches for last several days. nad noted.

## 2015-02-05 NOTE — ED Notes (Signed)
Pt c/o right hip pain, n/v, weakness that started about 1 week ago. Pt has been nauseated but unable to get anything up in the last 24 hours. Pt reports total rip hip replacement 6 weeks ago at [facility] denies injury to hip.

## 2015-02-05 NOTE — Discharge Instructions (Signed)
Drink plenty of fluids. And follow up with your md next week for recheck.  Return if needed

## 2015-02-05 NOTE — ED Provider Notes (Signed)
CSN: [id number]     Arrival date & time 02/05/15  1235 History  This chart was scribed for [name], MD by Terressa Koyanagi, ED Scribe. This patient was seen in room APA11/APA11 and the patient's care was started at 12:49 PM.  Chief Complaint  Patient presents with  . Emesis   Patient is a 59 y.o. male presenting with abdominal pain and hip pain. The history is provided by the patient. No language interpreter was used.  Abdominal Pain Pain quality: sharp   Pain severity:  Severe Onset quality:  Sudden Timing:  Intermittent Progression:  Worsening Chronicity:  Chronic Context: not trauma   Relieved by:  None tried Worsened by:  Nothing tried Ineffective treatments:  None tried Associated symptoms: nausea and vomiting   Associated symptoms: no chest pain, no cough, no diarrhea, no fatigue and no hematuria   Nausea:    Onset quality:  Unable to specify   Timing:  Intermittent Risk factors: not obese and not pregnant   Hip Pain This is a chronic problem. Associated symptoms include abdominal pain. Pertinent negatives include no chest pain and no headaches.   PCP: Maggie Font, MD HPI Comments: Robert Durham is a 59 y.o. male, with PMHx noted below including left hip replacement completed at [facility] on 11/20/14, who presents to the Emergency Department with multiple complaints.   Pt complains of recurrent, 8/10, abd pain with associated generalized body aches, n/v onset several days ago. Pt denies Hx of diverticulitis.    Pt also complains of chronic left hip pain.  Past Medical History  Diagnosis Date  . Hypertension   . Depression   . Urinary retention   . Diabetes mellitus   . Chronic back pain   . Chronic hip pain   . Diverticulitis   . H/O kidney transplant   . Renal disorder   . Kidney stone    Past Surgical History  Procedure Laterality Date  . Kidney transplant    . Hernia repair    . Nephrectomy transplanted organ    . Colonoscopy  05/27/2003    MEQ:ASTMHD  terminal ileum,rectum, and colon  . Hip surgery     Family History  Problem Relation Age of Onset  . Hypertension Sister    Social History  Substance Use Topics  . Smoking status: Former Smoker -- 0.00 packs/day for 5 years    Quit date: 06/14/1972  . Smokeless tobacco: Never Used  . Alcohol Use: No    Review of Systems  Constitutional: Negative for appetite change and fatigue.  HENT: Negative for congestion, ear discharge and sinus pressure.   Eyes: Negative for discharge.  Respiratory: Negative for cough.   Cardiovascular: Negative for chest pain.  Gastrointestinal: Positive for nausea, vomiting and abdominal pain. Negative for diarrhea.  Genitourinary: Negative for frequency and hematuria.  Musculoskeletal: Positive for arthralgias. Negative for back pain.  Skin: Negative for rash.  Neurological: Negative for seizures and headaches.  Psychiatric/Behavioral: Negative for hallucinations.   Allergies  Codeine; Nsaids; and Vasotec  Home Medications   Prior to Admission medications   Medication Sig Start Date End Date Taking? Authorizing Provider  atorvastatin (LIPITOR) 10 MG tablet Take 10 mg by mouth every morning.     Historical Provider, MD  cholecalciferol (VITAMIN D) 1000 UNITS tablet Take 1,000 Units by mouth daily.    Historical Provider, MD  cycloSPORINE (SANDIMMUNE) 100 MG capsule Take 100 mg by mouth 2 (two) times daily. Take with 25 mg tablet to equal  125 mg twice daily    Historical Provider, MD  cycloSPORINE (SANDIMMUNE) 25 MG capsule Take 25 mg by mouth 2 (two) times daily. Take with 100 mg tablet to equal dose of 125 mg twice daily    Historical Provider, MD  esomeprazole (NEXIUM) 40 MG capsule Take 40 mg by mouth 2 (two) times daily.     Historical Provider, MD  HYDROcodone-acetaminophen (NORCO) 5-325 MG per tablet Take 1 tablet by mouth every 4 (four) hours as needed. Patient not taking: Reported on 01/27/2015 10/25/14   Daleen Bo, MD  HYDROmorphone  (DILAUDID) 4 MG tablet Take 4-8 mg by mouth every 6 (six) hours as needed for moderate pain or severe pain.    Historical Provider, MD  iron polysaccharides (NU-IRON) 150 MG capsule Take 150 mg by mouth daily.    Historical Provider, MD  predniSONE (DELTASONE) 5 MG tablet Take 5 mg by mouth every morning.     Historical Provider, MD  sennosides-docusate sodium (SENOKOT-S) 8.6-50 MG tablet Take 2 tablets by mouth 2 (two) times daily.    Historical Provider, MD  tamsulosin (FLOMAX) 0.4 MG CAPS capsule Take 0.4 mg by mouth daily.     Historical Provider, MD  vitamin C (ASCORBIC ACID) 500 MG tablet Take 500 mg by mouth daily.    Historical Provider, MD   Triage Vitals: BP 89/61 mmHg  Pulse 75  Temp(Src) 97.6 F (36.4 C) (Oral)  Resp 18  Ht 5\' 11"  (1.803 m)  Wt 155 lb (70.308 kg)  BMI 21.63 kg/m2  SpO2 100% Physical Exam  Constitutional: He is oriented to person, place, and time. He appears well-developed.  HENT:  Head: Normocephalic.  Eyes: Conjunctivae and EOM are normal. No scleral icterus.  Neck: Neck supple. No thyromegaly present.  Cardiovascular: Normal rate and regular rhythm.  Exam reveals no gallop and no friction rub.   No murmur heard. Pulmonary/Chest: No stridor. He has no wheezes. He has no rales. He exhibits no tenderness.  Abdominal: He exhibits no distension. There is tenderness (mild LLQ). There is no rebound.  Musculoskeletal: Normal range of motion. He exhibits tenderness (left hip tenderness). He exhibits no edema.  Lymphadenopathy:    He has no cervical adenopathy.  Neurological: He is oriented to person, place, and time. He exhibits normal muscle tone. Coordination normal.  Skin: No rash noted. No erythema.  Psychiatric: He has a normal mood and affect. His behavior is normal.    ED Course  Procedures (including critical care time) DIAGNOSTIC STUDIES: Oxygen Saturation is 100% on RA, nl by my interpretation.    COORDINATION OF CARE: 12:52 PM: Discussed  treatment plan  with pt at bedside; patient verbalizes understanding and agrees with treatment plan.  Labs Review Labs Reviewed - No data to display  Imaging Review No results found. I have personally reviewed and evaluated these images and lab results as part of my medical decision-making.   EKG Interpretation None      MDM   Final diagnoses:  None    Labs unremarkable except mild elevation of lipase,   Suspect viral gastroenteritis with mild dehydration,  Doubt pancreatitis,  Will rx zofran and pcp follow up next week.   [name], MD 02/05/15 [phone]

## 2015-03-08 ENCOUNTER — Emergency Department (HOSPITAL_COMMUNITY)
Admission: EM | Admit: 2015-03-08 | Discharge: 2015-03-08 | Disposition: A | Discharge status: Home / Self Care | Payer: Medicare Other | Attending: Emergency Medicine | Admitting: Emergency Medicine

## 2015-03-08 ENCOUNTER — Emergency Department (HOSPITAL_COMMUNITY): Payer: Medicare Other

## 2015-03-08 ENCOUNTER — Encounter (HOSPITAL_COMMUNITY): Payer: Self-pay | Admitting: Emergency Medicine

## 2015-03-08 DIAGNOSIS — I10 Essential (primary) hypertension: Secondary | ICD-10-CM | POA: Insufficient documentation

## 2015-03-08 DIAGNOSIS — R1110 Vomiting, unspecified: Secondary | ICD-10-CM | POA: Diagnosis not present

## 2015-03-08 DIAGNOSIS — Z87891 Personal history of nicotine dependence: Secondary | ICD-10-CM | POA: Insufficient documentation

## 2015-03-08 DIAGNOSIS — K5700 Diverticulitis of small intestine with perforation and abscess without bleeding: Secondary | ICD-10-CM | POA: Diagnosis not present

## 2015-03-08 DIAGNOSIS — Z87448 Personal history of other diseases of urinary system: Secondary | ICD-10-CM | POA: Insufficient documentation

## 2015-03-08 DIAGNOSIS — R1032 Left lower quadrant pain: Secondary | ICD-10-CM | POA: Diagnosis not present

## 2015-03-08 DIAGNOSIS — R1030 Lower abdominal pain, unspecified: Secondary | ICD-10-CM | POA: Diagnosis not present

## 2015-03-08 DIAGNOSIS — G8929 Other chronic pain: Secondary | ICD-10-CM | POA: Diagnosis not present

## 2015-03-08 DIAGNOSIS — Z8659 Personal history of other mental and behavioral disorders: Secondary | ICD-10-CM | POA: Diagnosis not present

## 2015-03-08 DIAGNOSIS — Z79899 Other long term (current) drug therapy: Secondary | ICD-10-CM | POA: Insufficient documentation

## 2015-03-08 DIAGNOSIS — E119 Type 2 diabetes mellitus without complications: Secondary | ICD-10-CM | POA: Insufficient documentation

## 2015-03-08 DIAGNOSIS — Z940 Kidney transplant status: Secondary | ICD-10-CM | POA: Insufficient documentation

## 2015-03-08 DIAGNOSIS — Z87442 Personal history of urinary calculi: Secondary | ICD-10-CM | POA: Diagnosis not present

## 2015-03-08 DIAGNOSIS — R197 Diarrhea, unspecified: Secondary | ICD-10-CM | POA: Diagnosis not present

## 2015-03-08 DIAGNOSIS — R1031 Right lower quadrant pain: Secondary | ICD-10-CM | POA: Diagnosis present

## 2015-03-08 LAB — URINALYSIS, ROUTINE W REFLEX MICROSCOPIC
Bilirubin Urine: NEGATIVE
GLUCOSE, UA: NEGATIVE mg/dL
Hgb urine dipstick: NEGATIVE
KETONES UR: NEGATIVE mg/dL
LEUKOCYTES UA: NEGATIVE
Nitrite: NEGATIVE
PH: 6.5 (ref 5.0–8.0)
Protein, ur: NEGATIVE mg/dL
Specific Gravity, Urine: 1.015 (ref 1.005–1.030)
Urobilinogen, UA: 1 mg/dL (ref 0.0–1.0)

## 2015-03-08 LAB — CBC WITH DIFFERENTIAL/PLATELET
BASOS PCT: 1 %
Basophils Absolute: 0 10*3/uL (ref 0.0–0.1)
Eosinophils Absolute: 0.3 10*3/uL (ref 0.0–0.7)
Eosinophils Relative: 4 %
HEMATOCRIT: 36.1 % — AB (ref 39.0–52.0)
HEMOGLOBIN: 11.6 g/dL — AB (ref 13.0–17.0)
LYMPHS ABS: 2.5 10*3/uL (ref 0.7–4.0)
Lymphocytes Relative: 40 %
MCH: 22.7 pg — AB (ref 26.0–34.0)
MCHC: 32.1 g/dL (ref 30.0–36.0)
MCV: 70.5 fL — ABNORMAL LOW (ref 78.0–100.0)
MONO ABS: 0.6 10*3/uL (ref 0.1–1.0)
MONOS PCT: 9 %
NEUTROS PCT: 47 %
Neutro Abs: 3 10*3/uL (ref 1.7–7.7)
Platelets: 182 10*3/uL (ref 150–400)
RBC: 5.12 MIL/uL (ref 4.22–5.81)
RDW: 15.4 % (ref 11.5–15.5)
WBC: 6.4 10*3/uL (ref 4.0–10.5)

## 2015-03-08 LAB — COMPREHENSIVE METABOLIC PANEL
ALT: 10 U/L — ABNORMAL LOW (ref 17–63)
ANION GAP: 7 (ref 5–15)
AST: 15 U/L (ref 15–41)
Albumin: 3.4 g/dL — ABNORMAL LOW (ref 3.5–5.0)
Alkaline Phosphatase: 69 U/L (ref 38–126)
BILIRUBIN TOTAL: 0.5 mg/dL (ref 0.3–1.2)
BUN: 14 mg/dL (ref 6–20)
CALCIUM: 8.4 mg/dL — AB (ref 8.9–10.3)
CO2: 24 mmol/L (ref 22–32)
Chloride: 106 mmol/L (ref 101–111)
Creatinine, Ser: 1.32 mg/dL — ABNORMAL HIGH (ref 0.61–1.24)
GFR calc Af Amer: >60 mL/min (ref >60)
GFR calc non Af Amer: 57 mL/min — ABNORMAL LOW (ref >60)
GLUCOSE: 93 mg/dL (ref 65–99)
Potassium: 3.7 mmol/L (ref 3.5–5.1)
Sodium: 137 mmol/L (ref 135–145)
Total Protein: 6.8 g/dL (ref 6.5–8.1)

## 2015-03-08 LAB — LIPASE, BLOOD: Lipase: 25 U/L (ref 22–51)

## 2015-03-08 MED ORDER — CIPROFLOXACIN HCL 500 MG PO TABS: 500.0000 mg | ORAL_TABLET | Freq: Two times a day (BID) | ORAL | Status: DC

## 2015-03-08 MED ORDER — ONDANSETRON 4 MG PO TBDP: 4.0000 mg | ORAL_TABLET | Freq: Three times a day (TID) | ORAL | Status: DC | PRN

## 2015-03-08 MED ORDER — ONDANSETRON HCL 4 MG/2ML IJ SOLN
4.0000 mg | Freq: Once | INTRAMUSCULAR | Status: AC
  Administered 2015-03-08: 4 mg via INTRAVENOUS

## 2015-03-08 MED ORDER — HYDROCODONE-ACETAMINOPHEN 5-325 MG PO TABS: 2.0000 | ORAL_TABLET | ORAL | Status: DC | PRN

## 2015-03-08 MED ORDER — METRONIDAZOLE 500 MG PO TABS: 500.0000 mg | ORAL_TABLET | Freq: Two times a day (BID) | ORAL | Status: DC

## 2015-03-08 MED ORDER — HYDROMORPHONE HCL 1 MG/ML IJ SOLN
1.0000 mg | Freq: Once | INTRAMUSCULAR | Status: AC
  Administered 2015-03-08: 1 mg via INTRAVENOUS
  Filled 2015-03-08: qty 1

## 2015-03-08 MED ORDER — IOHEXOL 300 MG/ML  SOLN
50.0000 mL | Freq: Once | INTRAMUSCULAR | Status: AC | PRN
  Administered 2015-03-08: 50 mL via ORAL

## 2015-03-08 MED ORDER — ONDANSETRON HCL 4 MG/2ML IJ SOLN
4.0000 mg | Freq: Once | INTRAMUSCULAR | Status: DC
  Filled 2015-03-08: qty 2

## 2015-03-08 NOTE — ED Notes (Signed)
Pt reports lower abdominal cramping. Pt denies n/v/d. Pt also reports left hip pain since left hip replacement on July 8th. Pt denies any known injury.

## 2015-03-08 NOTE — ED Provider Notes (Signed)
CSN: [id number]     Arrival date & time 03/08/15  5188 History   First MD Initiated Contact with Patient 03/08/15 [phone]     Chief Complaint  Patient presents with  . Abdominal Pain     (Consider location/radiation/quality/duration/timing/severity/associated sxs/prior Treatment) Patient is a 59 y.o. male presenting with abdominal pain. The history is provided by the patient. No language interpreter was used.  Abdominal Pain Pain location:  RLQ and LLQ Pain quality: aching   Pain radiates to:  Does not radiate Pain severity:  Moderate Timing:  Constant Progression:  Worsening Chronicity:  New Context: not sick contacts   Relieved by:  Nothing Worsened by:  Nothing tried Ineffective treatments:  None tried Associated symptoms: nausea   Associated symptoms: no cough, no fever and no vomiting   Risk factors: multiple surgeries   Risk factors: no alcohol abuse     Past Medical History  Diagnosis Date  . Hypertension   . Depression   . Urinary retention   . Diabetes mellitus   . Chronic back pain   . Chronic hip pain   . Diverticulitis   . H/O kidney transplant   . Renal disorder   . Kidney stone    Past Surgical History  Procedure Laterality Date  . Kidney transplant    . Hernia repair    . Nephrectomy transplanted organ    . Colonoscopy  05/27/2003    AYT:[id number] terminal ileum,rectum, and colon  . Hip surgery     Family History  Problem Relation Age of Onset  . Hypertension Sister    Social History  Substance Use Topics  . Smoking status: Former Smoker -- 0.00 packs/day for 5 years    Quit date: 06/14/1972  . Smokeless tobacco: Never Used  . Alcohol Use: No    Review of Systems  Constitutional: Negative for fever.  Respiratory: Negative for cough.   Gastrointestinal: Positive for nausea and abdominal pain. Negative for vomiting.  All other systems reviewed and are negative.     Allergies  Codeine; Nsaids; and Vasotec  Home Medications   Prior to  Admission medications   Medication Sig Start Date End Date Taking? Authorizing Provider  atorvastatin (LIPITOR) 10 MG tablet Take 10 mg by mouth every morning.    Yes Historical Provider, MD  cycloSPORINE (SANDIMMUNE) 100 MG capsule Take 100 mg by mouth 2 (two) times daily. Take with 25 mg tablet to equal 125 mg twice daily   Yes Historical Provider, MD  cycloSPORINE (SANDIMMUNE) 25 MG capsule Take 25 mg by mouth 2 (two) times daily. Take with 100 mg tablet to equal dose of 125 mg twice daily   Yes Historical Provider, MD  esomeprazole (NEXIUM) 40 MG capsule Take 40 mg by mouth 2 (two) times daily.    Yes Historical Provider, MD  predniSONE (DELTASONE) 5 MG tablet Take 5 mg by mouth every morning.    Yes Historical Provider, MD  promethazine (PHENERGAN) 25 MG tablet Take 25 mg by mouth every 6 (six) hours as needed for nausea or vomiting.   Yes Historical Provider, MD  tamsulosin (FLOMAX) 0.4 MG CAPS capsule Take 0.4 mg by mouth daily.    Yes Historical Provider, MD  vitamin B-12 (CYANOCOBALAMIN) 1000 MCG tablet Take 1,000 mcg by mouth daily.   Yes Historical Provider, MD  HYDROcodone-acetaminophen (NORCO) 5-325 MG per tablet Take 1 tablet by mouth every 4 (four) hours as needed. Patient not taking: Reported on 01/27/2015 10/25/14   Daleen Bo, MD  BP 124/82 mmHg  Pulse 52  Temp(Src) 98 F (36.7 C) (Oral)  Resp 18  Ht 5\' 11"  (1.803 m)  Wt 155 lb (70.308 kg)  BMI 21.63 kg/m2  SpO2 100% Physical Exam  Constitutional: He is oriented to person, place, and time. He appears well-developed and well-nourished.  HENT:  Head: Normocephalic.  Right Ear: External ear normal.  Left Ear: External ear normal.  Nose: Nose normal.  Mouth/Throat: Oropharynx is clear and moist.  Eyes: Conjunctivae and EOM are normal. Pupils are equal, round, and reactive to light.  Neck: Normal range of motion. Neck supple.  Cardiovascular: Normal rate and normal heart sounds.   Pulmonary/Chest: Effort normal and  breath sounds normal.  Abdominal: Soft. He exhibits no distension. There is tenderness.  Musculoskeletal: Normal range of motion.  Neurological: He is alert and oriented to person, place, and time.  Skin: Skin is warm.  Psychiatric: He has a normal mood and affect.  Nursing note and vitals reviewed.   ED Course  Procedures (including critical care time) Labs Review Labs Reviewed  CBC WITH DIFFERENTIAL/PLATELET - Abnormal; Notable for the following:    Hemoglobin 11.6 (*)    HCT 36.1 (*)    MCV 70.5 (*)    MCH 22.7 (*)    All other components within normal limits  COMPREHENSIVE METABOLIC PANEL - Abnormal; Notable for the following:    Creatinine, Ser 1.32 (*)    Calcium 8.4 (*)    Albumin 3.4 (*)    ALT 10 (*)    GFR calc non Af Amer 57 (*)    All other components within normal limits  URINALYSIS, ROUTINE W REFLEX MICROSCOPIC (NOT AT [facility])  LIPASE, BLOOD    Imaging Review Ct Abdomen Pelvis Wo Contrast  03/08/2015   CLINICAL DATA:  Lower abdominal cramping. Left hip pain. Prior left hip replacement.  EXAM: CT ABDOMEN AND PELVIS WITHOUT CONTRAST  TECHNIQUE: Multidetector CT imaging of the abdomen and pelvis was performed following the standard protocol without IV contrast.  COMPARISON:  05/11/2014  FINDINGS: Dependent atelectasis in the lung bases. Heart is normal size. No effusions.  Scattered small hypodensities throughout the liver, most compatible with small cysts, stable. Gallbladder unremarkable. Spleen, pancreas, adrenals are unremarkable. Native kidneys are severely atrophic. No hydronephrosis. Left lower quadrant renal transplant noted which is grossly unremarkable. No hydronephrosis. Urinary bladder grossly unremarkable.  Scattered colonic diverticulosis. Slight stranding noted around the sigmoid colon suggesting early active diverticulitis. No free fluid, free air or adenopathy. Appendix is visualized and is normal. Stomach and small bowel decompressed and unremarkable. Aorta  is normal caliber. No free fluid, free air or adenopathy.  Prior left hip replacement normal.  No acute bony abnormality.  IMPRESSION: Focal left lower quadrant renal transplant is unremarkable.  Colonic diverticulosis, most pronounced in the sigmoid colon with slight stranding noted suggesting early active diverticulitis.  Normal appendix.  Prior left hip replacement.   Electronically Signed   By: Rolm Baptise M.D.   On: 03/08/2015 11:54   I have personally reviewed and evaluated these images and lab results as part of my medical decision-making.   EKG Interpretation None      MDM  Pt counseled on results.  Pt advised to follow up with Dr. Berdine Addison for evaluation and recheck.  You will need to have a followup colonoscopy with your MD as you area almost 59.    Final diagnoses:  Diverticulitis of small intestine with abscess without bleeding    Meds ordered  this encounter  Medications  . promethazine (PHENERGAN) 25 MG tablet    Sig: Take 25 mg by mouth every 6 (six) hours as needed for nausea or vomiting.  . vitamin B-12 (CYANOCOBALAMIN) 1000 MCG tablet    Sig: Take 1,000 mcg by mouth daily.  Marland Kitchen HYDROmorphone (DILAUDID) injection 1 mg    Sig:   . DISCONTD: ondansetron (ZOFRAN) injection 4 mg    Sig:   . ondansetron (ZOFRAN) injection 4 mg    Sig:   . iohexol (OMNIPAQUE) 300 MG/ML solution 50 mL    Sig:   . metroNIDAZOLE (FLAGYL) 500 MG tablet    Sig: Take 1 tablet (500 mg total) by mouth 2 (two) times daily.    Dispense:  14 tablet    Refill:  0    Order Specific Question:  Supervising Provider    Answer:  MILLER, BRIAN [[id number]]  . ciprofloxacin (CIPRO) 500 MG tablet    Sig: Take 1 tablet (500 mg total) by mouth 2 (two) times daily.    Dispense:  20 tablet    Refill:  0    Order Specific Question:  Supervising Provider    Answer:  MILLER, BRIAN [[id number]]  . HYDROcodone-acetaminophen (NORCO/VICODIN) 5-325 MG per tablet    Sig: Take 2 tablets by mouth every 4 (four) hours as needed.     Dispense:  20 tablet    Refill:  0    Order Specific Question:  Supervising Provider    Answer:  MILLER, BRIAN [[id number]]  . ondansetron (ZOFRAN ODT) 4 MG disintegrating tablet    Sig: Take 1 tablet (4 mg total) by mouth every 8 (eight) hours as needed for nausea or vomiting.    Dispense:  20 tablet    Refill:  0    Order Specific Question:  Supervising Provider    Answer:  Noemi Chapel [[id number]]    avs  Fransico Meadow, PA-C 03/08/15 1242  Ripley Fraise, MD 03/08/15 1336

## 2015-03-08 NOTE — Discharge Instructions (Signed)

## 2015-03-09 ENCOUNTER — Encounter (HOSPITAL_COMMUNITY): Payer: Self-pay

## 2015-03-09 ENCOUNTER — Emergency Department (HOSPITAL_COMMUNITY)
Admission: EM | Admit: 2015-03-09 | Discharge: 2015-03-09 | Discharge status: Absent without leave | Payer: Medicare Other

## 2015-03-09 ENCOUNTER — Emergency Department (HOSPITAL_COMMUNITY)
Admission: EM | Admit: 2015-03-09 | Discharge: 2015-03-09 | Disposition: A | Discharge status: Home / Self Care | Payer: Medicare Other | Source: Home / Self Care | Attending: Emergency Medicine | Admitting: Emergency Medicine

## 2015-03-09 DIAGNOSIS — R197 Diarrhea, unspecified: Secondary | ICD-10-CM | POA: Diagnosis not present

## 2015-03-09 DIAGNOSIS — R1110 Vomiting, unspecified: Secondary | ICD-10-CM | POA: Diagnosis not present

## 2015-03-09 DIAGNOSIS — R110 Nausea: Secondary | ICD-10-CM | POA: Diagnosis not present

## 2015-03-09 DIAGNOSIS — K5700 Diverticulitis of small intestine with perforation and abscess without bleeding: Secondary | ICD-10-CM | POA: Diagnosis not present

## 2015-03-09 DIAGNOSIS — R1032 Left lower quadrant pain: Secondary | ICD-10-CM

## 2015-03-09 DIAGNOSIS — K2970 Gastritis, unspecified, without bleeding: Secondary | ICD-10-CM | POA: Diagnosis not present

## 2015-03-09 LAB — CBC WITH DIFFERENTIAL/PLATELET
BASOS ABS: 0 10*3/uL (ref 0.0–0.1)
BASOS PCT: 0 %
EOS ABS: 0.3 10*3/uL (ref 0.0–0.7)
EOS PCT: 3 %
HCT: 37.6 % — ABNORMAL LOW (ref 39.0–52.0)
Hemoglobin: 12.1 g/dL — ABNORMAL LOW (ref 13.0–17.0)
Lymphocytes Relative: 22 %
Lymphs Abs: 2.1 10*3/uL (ref 0.7–4.0)
MCH: 22.7 pg — ABNORMAL LOW (ref 26.0–34.0)
MCHC: 32.2 g/dL (ref 30.0–36.0)
MCV: 70.5 fL — ABNORMAL LOW (ref 78.0–100.0)
MONO ABS: 0.7 10*3/uL (ref 0.1–1.0)
Monocytes Relative: 8 %
Neutro Abs: 6.5 10*3/uL (ref 1.7–7.7)
Neutrophils Relative %: 67 %
PLATELETS: 173 10*3/uL (ref 150–400)
RBC: 5.33 MIL/uL (ref 4.22–5.81)
RDW: 15.3 % (ref 11.5–15.5)
WBC: 9.6 10*3/uL (ref 4.0–10.5)

## 2015-03-09 MED ORDER — ONDANSETRON 8 MG PO TBDP
8.0000 mg | ORAL_TABLET | Freq: Once | ORAL | Status: AC
  Administered 2015-03-09: 8 mg via ORAL
  Filled 2015-03-09: qty 1

## 2015-03-09 MED ORDER — ACETAMINOPHEN 325 MG PO TABS
650.0000 mg | ORAL_TABLET | Freq: Once | ORAL | Status: AC
  Administered 2015-03-09: 650 mg via ORAL
  Filled 2015-03-09: qty 2

## 2015-03-09 NOTE — ED Notes (Signed)
Pt states he was here last night with abdominal pain. States he got pain medicine but cannot take it. States he is still having abdominal pain

## 2015-03-09 NOTE — ED Notes (Signed)
Patient given ginger ale to sip on. 

## 2015-03-09 NOTE — ED Notes (Signed)
MD at bedside. 

## 2015-03-09 NOTE — Discharge Instructions (Signed)

## 2015-03-09 NOTE — ED Provider Notes (Signed)
CSN: [id number]     Arrival date & time 03/09/15  1212 History  This chart was scribed for Ripley Fraise, MD by Hilda Lias, ED Scribe. This patient was seen in room APA18/APA18 and the patient's care was started at 12:32 PM.  Chief Complaint  Patient presents with  . Abdominal Pain      Patient is a 59 y.o. male presenting with abdominal pain. The history is provided by the patient. No language interpreter was used.  Abdominal Pain Pain location:  Generalized Pain radiates to:  Does not radiate Pain severity:  Moderate Onset quality:  Gradual Duration:  3 days Timing:  Constant Progression:  Unchanged Chronicity:  Recurrent Associated symptoms: diarrhea and vomiting    HPI Comments: Robert Durham is a 59 y.o. male who presents to the Emergency Department complaining of constant generalized abdominal pain with associated vomiting and diarrhea that has been present for a few days. Pt was seen yesterday for similar symptoms and was given medication to take, but states he vomits each time he takes the medication, so his abdominal pain has not improved. Pt reports he cannot eat or drink much without feeling sick. Pt denies blood in diarrhea, blood in vomit, or groin swelling or tenderness.   Past Medical History  Diagnosis Date  . Hypertension   . Depression   . Urinary retention   . Diabetes mellitus   . Chronic back pain   . Chronic hip pain   . Diverticulitis   . H/O kidney transplant   . Renal disorder   . Kidney stone    Past Surgical History  Procedure Laterality Date  . Kidney transplant    . Hernia repair    . Nephrectomy transplanted organ    . Colonoscopy  05/27/2003    VAP:OLIDCV terminal ileum,rectum, and colon  . Hip surgery     Family History  Problem Relation Age of Onset  . Hypertension Sister    Social History  Substance Use Topics  . Smoking status: Former Smoker -- 0.00 packs/day for 5 years    Quit date: 06/14/1972  . Smokeless tobacco:  Never Used  . Alcohol Use: No    Review of Systems  Constitutional: Positive for appetite change.  Gastrointestinal: Positive for vomiting, abdominal pain and diarrhea.  All other systems reviewed and are negative.     Allergies  Codeine; Nsaids; and Vasotec  Home Medications   Prior to Admission medications   Medication Sig Start Date End Date Taking? Authorizing Provider  atorvastatin (LIPITOR) 10 MG tablet Take 10 mg by mouth every morning.     Historical Provider, MD  ciprofloxacin (CIPRO) 500 MG tablet Take 1 tablet (500 mg total) by mouth 2 (two) times daily. 03/08/15   Fransico Meadow, PA-C  cycloSPORINE (SANDIMMUNE) 100 MG capsule Take 100 mg by mouth 2 (two) times daily. Take with 25 mg tablet to equal 125 mg twice daily    Historical Provider, MD  cycloSPORINE (SANDIMMUNE) 25 MG capsule Take 25 mg by mouth 2 (two) times daily. Take with 100 mg tablet to equal dose of 125 mg twice daily    Historical Provider, MD  esomeprazole (NEXIUM) 40 MG capsule Take 40 mg by mouth 2 (two) times daily.     Historical Provider, MD  HYDROcodone-acetaminophen (NORCO/VICODIN) 5-325 MG per tablet Take 2 tablets by mouth every 4 (four) hours as needed. 03/08/15   Fransico Meadow, PA-C  metroNIDAZOLE (FLAGYL) 500 MG tablet Take 1 tablet (500 mg  total) by mouth 2 (two) times daily. 03/08/15   Fransico Meadow, PA-C  ondansetron (ZOFRAN ODT) 4 MG disintegrating tablet Take 1 tablet (4 mg total) by mouth every 8 (eight) hours as needed for nausea or vomiting. 03/08/15   Fransico Meadow, PA-C  predniSONE (DELTASONE) 5 MG tablet Take 5 mg by mouth every morning.     Historical Provider, MD  promethazine (PHENERGAN) 25 MG tablet Take 25 mg by mouth every 6 (six) hours as needed for nausea or vomiting.    Historical Provider, MD  tamsulosin (FLOMAX) 0.4 MG CAPS capsule Take 0.4 mg by mouth daily.     Historical Provider, MD  vitamin B-12 (CYANOCOBALAMIN) 1000 MCG tablet Take 1,000 mcg by mouth daily.     Historical Provider, MD   BP 122/83 mmHg  Pulse 75  Temp(Src) 98 F (36.7 C) (Oral)  Ht 5\' 11"  (1.803 m)  Wt 155 lb (70.308 kg)  BMI 21.63 kg/m2  SpO2 100% Physical Exam  Nursing note and vitals reviewed.  CONSTITUTIONAL: Well developed/well nourished HEAD: Normocephalic/atraumatic EYES: EOMI/PERRL ENMT: Mucous membranes moist NECK: supple no meningeal signs SPINE/BACK:entire spine nontender CV: S1/S2 noted, no murmurs/rubs/gallops noted LUNGS: Lungs are clear to auscultation bilaterally, no apparent distress ABDOMEN: soft, nontender, no rebound or guarding, bowel sounds noted throughout abdomen GU:no cva tenderness NEURO: Pt is awake/alert/appropriate, moves all extremitiesx4.  No facial droop.   EXTREMITIES: pulses normal/equal, full ROM SKIN: warm, color normal PSYCH: no abnormalities of mood noted, alert and oriented to situation  ED Course  Procedures   DIAGNOSTIC STUDIES: Oxygen Saturation is 100% on room air, normal by my interpretation.    COORDINATION OF CARE: 12:39 PM Discussed treatment plan with pt at bedside and pt agreed to plan.   Labs Review Labs Reviewed  CBC WITH DIFFERENTIAL/PLATELET - Abnormal; Notable for the following:    Hemoglobin 12.1 (*)    HCT 37.6 (*)    MCV 70.5 (*)    MCH 22.7 (*)    All other components within normal limits    Imaging Review Ct Abdomen Pelvis Wo Contrast  03/08/2015   CLINICAL DATA:  Lower abdominal cramping. Left hip pain. Prior left hip replacement.  EXAM: CT ABDOMEN AND PELVIS WITHOUT CONTRAST  TECHNIQUE: Multidetector CT imaging of the abdomen and pelvis was performed following the standard protocol without IV contrast.  COMPARISON:  05/11/2014  FINDINGS: Dependent atelectasis in the lung bases. Heart is normal size. No effusions.  Scattered small hypodensities throughout the liver, most compatible with small cysts, stable. Gallbladder unremarkable. Spleen, pancreas, adrenals are unremarkable. Native kidneys are  severely atrophic. No hydronephrosis. Left lower quadrant renal transplant noted which is grossly unremarkable. No hydronephrosis. Urinary bladder grossly unremarkable.  Scattered colonic diverticulosis. Slight stranding noted around the sigmoid colon suggesting early active diverticulitis. No free fluid, free air or adenopathy. Appendix is visualized and is normal. Stomach and small bowel decompressed and unremarkable. Aorta is normal caliber. No free fluid, free air or adenopathy.  Prior left hip replacement normal.  No acute bony abnormality.  IMPRESSION: Focal left lower quadrant renal transplant is unremarkable.  Colonic diverticulosis, most pronounced in the sigmoid colon with slight stranding noted suggesting early active diverticulitis.  Normal appendix.  Prior left hip replacement.   Electronically Signed   By: Rolm Baptise M.D.   On: 03/08/2015 11:54   Pt found to have uncomplicated diverticulitis yesterday He is well appearing No focal tenderness on exam CBC unremarkable He is taking PO fluids Advised  to continue antibiotics and call his PCP tomorrow morning   MDM   Final diagnoses:  Left lower quadrant pain    Nursing notes including past medical history and social history reviewed and considered in documentation Labs/vital reviewed myself and considered during evaluation Previous records reviewed and considered   I personally performed the services described in this documentation, which was scribed in my presence. The recorded information has been reviewed and is accurate.       Ripley Fraise, MD 03/09/15 1332

## 2015-03-11 DIAGNOSIS — D508 Other iron deficiency anemias: Secondary | ICD-10-CM | POA: Diagnosis not present

## 2015-03-11 DIAGNOSIS — Z9889 Other specified postprocedural states: Secondary | ICD-10-CM | POA: Diagnosis not present

## 2015-03-11 DIAGNOSIS — M25552 Pain in left hip: Secondary | ICD-10-CM | POA: Diagnosis not present

## 2015-03-11 DIAGNOSIS — R827 Abnormal findings on microbiological examination of urine: Secondary | ICD-10-CM | POA: Diagnosis not present

## 2015-03-11 DIAGNOSIS — D631 Anemia in chronic kidney disease: Secondary | ICD-10-CM | POA: Diagnosis not present

## 2015-03-11 DIAGNOSIS — Z96642 Presence of left artificial hip joint: Secondary | ICD-10-CM | POA: Diagnosis not present

## 2015-03-11 DIAGNOSIS — I10 Essential (primary) hypertension: Secondary | ICD-10-CM | POA: Diagnosis not present

## 2015-03-11 DIAGNOSIS — Z23 Encounter for immunization: Secondary | ICD-10-CM | POA: Diagnosis not present

## 2015-03-11 DIAGNOSIS — Z940 Kidney transplant status: Secondary | ICD-10-CM | POA: Diagnosis not present

## 2015-03-11 DIAGNOSIS — Z9225 Personal history of immunosupression therapy: Secondary | ICD-10-CM | POA: Diagnosis not present

## 2015-03-11 DIAGNOSIS — I120 Hypertensive chronic kidney disease with stage 5 chronic kidney disease or end stage renal disease: Secondary | ICD-10-CM | POA: Diagnosis not present

## 2015-03-11 DIAGNOSIS — N186 End stage renal disease: Secondary | ICD-10-CM | POA: Diagnosis not present

## 2015-03-26 DIAGNOSIS — Z6824 Body mass index (BMI) 24.0-24.9, adult: Secondary | ICD-10-CM | POA: Diagnosis not present

## 2015-03-26 DIAGNOSIS — M1612 Unilateral primary osteoarthritis, left hip: Secondary | ICD-10-CM | POA: Diagnosis not present

## 2015-03-26 DIAGNOSIS — I10 Essential (primary) hypertension: Secondary | ICD-10-CM | POA: Diagnosis not present

## 2015-04-03 DIAGNOSIS — N186 End stage renal disease: Secondary | ICD-10-CM | POA: Diagnosis not present

## 2015-04-03 DIAGNOSIS — E785 Hyperlipidemia, unspecified: Secondary | ICD-10-CM | POA: Diagnosis not present

## 2015-04-03 DIAGNOSIS — Z87891 Personal history of nicotine dependence: Secondary | ICD-10-CM | POA: Diagnosis not present

## 2015-04-03 DIAGNOSIS — Z96642 Presence of left artificial hip joint: Secondary | ICD-10-CM | POA: Diagnosis not present

## 2015-04-03 DIAGNOSIS — D631 Anemia in chronic kidney disease: Secondary | ICD-10-CM | POA: Diagnosis not present

## 2015-04-03 DIAGNOSIS — Z471 Aftercare following joint replacement surgery: Secondary | ICD-10-CM | POA: Diagnosis not present

## 2015-04-03 DIAGNOSIS — Z885 Allergy status to narcotic agent status: Secondary | ICD-10-CM | POA: Diagnosis not present

## 2015-04-03 DIAGNOSIS — Z888 Allergy status to other drugs, medicaments and biological substances status: Secondary | ICD-10-CM | POA: Diagnosis not present

## 2015-04-03 DIAGNOSIS — M1611 Unilateral primary osteoarthritis, right hip: Secondary | ICD-10-CM | POA: Diagnosis not present

## 2015-04-03 DIAGNOSIS — I120 Hypertensive chronic kidney disease with stage 5 chronic kidney disease or end stage renal disease: Secondary | ICD-10-CM | POA: Diagnosis not present

## 2015-04-03 DIAGNOSIS — Z940 Kidney transplant status: Secondary | ICD-10-CM | POA: Diagnosis not present

## 2015-05-08 ENCOUNTER — Encounter (HOSPITAL_COMMUNITY): Payer: Self-pay

## 2015-05-08 ENCOUNTER — Emergency Department (HOSPITAL_COMMUNITY)
Admission: EM | Admit: 2015-05-08 | Discharge: 2015-05-08 | Disposition: A | Discharge status: Home / Self Care | Payer: Medicare Other | Attending: Emergency Medicine | Admitting: Emergency Medicine

## 2015-05-08 DIAGNOSIS — R1032 Left lower quadrant pain: Secondary | ICD-10-CM | POA: Diagnosis present

## 2015-05-08 DIAGNOSIS — Z87891 Personal history of nicotine dependence: Secondary | ICD-10-CM | POA: Diagnosis not present

## 2015-05-08 DIAGNOSIS — Z940 Kidney transplant status: Secondary | ICD-10-CM | POA: Diagnosis not present

## 2015-05-08 DIAGNOSIS — G8929 Other chronic pain: Secondary | ICD-10-CM | POA: Diagnosis not present

## 2015-05-08 DIAGNOSIS — Z79899 Other long term (current) drug therapy: Secondary | ICD-10-CM | POA: Insufficient documentation

## 2015-05-08 DIAGNOSIS — E119 Type 2 diabetes mellitus without complications: Secondary | ICD-10-CM | POA: Insufficient documentation

## 2015-05-08 DIAGNOSIS — R109 Unspecified abdominal pain: Secondary | ICD-10-CM

## 2015-05-08 DIAGNOSIS — Z8659 Personal history of other mental and behavioral disorders: Secondary | ICD-10-CM | POA: Diagnosis not present

## 2015-05-08 DIAGNOSIS — Z7952 Long term (current) use of systemic steroids: Secondary | ICD-10-CM | POA: Diagnosis not present

## 2015-05-08 DIAGNOSIS — R1031 Right lower quadrant pain: Secondary | ICD-10-CM | POA: Diagnosis not present

## 2015-05-08 DIAGNOSIS — Z87442 Personal history of urinary calculi: Secondary | ICD-10-CM | POA: Diagnosis not present

## 2015-05-08 DIAGNOSIS — M545 Low back pain, unspecified: Secondary | ICD-10-CM

## 2015-05-08 DIAGNOSIS — Z8719 Personal history of other diseases of the digestive system: Secondary | ICD-10-CM | POA: Insufficient documentation

## 2015-05-08 DIAGNOSIS — I10 Essential (primary) hypertension: Secondary | ICD-10-CM | POA: Insufficient documentation

## 2015-05-08 LAB — CBC WITH DIFFERENTIAL/PLATELET
BASOS ABS: 0 10*3/uL (ref 0.0–0.1)
BASOS PCT: 1 %
EOS PCT: 5 %
Eosinophils Absolute: 0.4 10*3/uL (ref 0.0–0.7)
HCT: 39.6 % (ref 39.0–52.0)
Hemoglobin: 13 g/dL (ref 13.0–17.0)
LYMPHS PCT: 43 %
Lymphs Abs: 3.4 10*3/uL (ref 0.7–4.0)
MCH: 22.6 pg — ABNORMAL LOW (ref 26.0–34.0)
MCHC: 32.8 g/dL (ref 30.0–36.0)
MCV: 68.8 fL — AB (ref 78.0–100.0)
Monocytes Absolute: 0.7 10*3/uL (ref 0.1–1.0)
Monocytes Relative: 9 %
NEUTROS ABS: 3.4 10*3/uL (ref 1.7–7.7)
Neutrophils Relative %: 43 %
PLATELETS: 169 10*3/uL (ref 150–400)
RBC: 5.76 MIL/uL (ref 4.22–5.81)
RDW: 15 % (ref 11.5–15.5)
WBC: 7.8 10*3/uL (ref 4.0–10.5)

## 2015-05-08 LAB — URINALYSIS, ROUTINE W REFLEX MICROSCOPIC
BILIRUBIN URINE: NEGATIVE
GLUCOSE, UA: NEGATIVE mg/dL
HGB URINE DIPSTICK: NEGATIVE
Ketones, ur: NEGATIVE mg/dL
Leukocytes, UA: NEGATIVE
Nitrite: NEGATIVE
PH: 6 (ref 5.0–8.0)
Protein, ur: NEGATIVE mg/dL
SPECIFIC GRAVITY, URINE: 1.02 (ref 1.005–1.030)

## 2015-05-08 LAB — CBG MONITORING, ED: GLUCOSE-CAPILLARY: 86 mg/dL (ref 65–99)

## 2015-05-08 LAB — COMPREHENSIVE METABOLIC PANEL
ALBUMIN: 3.8 g/dL (ref 3.5–5.0)
ALT: 10 U/L — AB (ref 17–63)
AST: 21 U/L (ref 15–41)
Alkaline Phosphatase: 73 U/L (ref 38–126)
Anion gap: 8 (ref 5–15)
BUN: 21 mg/dL — AB (ref 6–20)
CHLORIDE: 106 mmol/L (ref 101–111)
CO2: 24 mmol/L (ref 22–32)
CREATININE: 1.44 mg/dL — AB (ref 0.61–1.24)
Calcium: 9.2 mg/dL (ref 8.9–10.3)
GFR calc Af Amer: 60 mL/min — ABNORMAL LOW (ref >60)
GFR, EST NON AFRICAN AMERICAN: 51 mL/min — AB (ref >60)
GLUCOSE: 90 mg/dL (ref 65–99)
POTASSIUM: 3.9 mmol/L (ref 3.5–5.1)
Sodium: 138 mmol/L (ref 135–145)
Total Bilirubin: 0.6 mg/dL (ref 0.3–1.2)
Total Protein: 7.7 g/dL (ref 6.5–8.1)

## 2015-05-08 MED ORDER — METRONIDAZOLE 500 MG PO TABS: 500.0000 mg | ORAL_TABLET | Freq: Four times a day (QID) | ORAL | Status: DC

## 2015-05-08 MED ORDER — HYDROCODONE-ACETAMINOPHEN 5-325 MG PO TABS: 1.0000 | ORAL_TABLET | Freq: Four times a day (QID) | ORAL | Status: DC | PRN

## 2015-05-08 MED ORDER — HYDROMORPHONE HCL 1 MG/ML IJ SOLN
1.0000 mg | Freq: Once | INTRAMUSCULAR | Status: AC
Start: 2015-05-08 — End: 2015-05-08
  Administered 2015-05-08: 1 mg via INTRAVENOUS
  Filled 2015-05-08: qty 1

## 2015-05-08 MED ORDER — ONDANSETRON HCL 4 MG/2ML IJ SOLN
4.0000 mg | Freq: Once | INTRAMUSCULAR | Status: AC
  Administered 2015-05-08: 4 mg via INTRAVENOUS
  Filled 2015-05-08: qty 2

## 2015-05-08 MED ORDER — CIPROFLOXACIN HCL 500 MG PO TABS: 500.0000 mg | ORAL_TABLET | Freq: Two times a day (BID) | ORAL | Status: DC

## 2015-05-08 NOTE — ED Notes (Signed)
Pt c/o lower abd pain and back pain x 3 days.  LBM was this am but said it was hard.  Denies n/v/d.

## 2015-05-08 NOTE — ED Notes (Signed)
MD at bedside. 

## 2015-05-08 NOTE — ED Provider Notes (Signed)
CSN: [id number]   Arrival date & time 05/08/15 0909  History  By signing my name below, I, Altamease Oiler, attest that this documentation has been prepared under the direction and in the presence of [name], MD. Electronically Signed: Altamease Oiler, ED Scribe. 05/08/2015. 11:56 AM  Chief Complaint  Patient presents with  . Abdominal Pain    HPI Patient is a 59 y.o. male presenting with abdominal pain. The history is provided by the patient. No language interpreter was used.  Abdominal Pain Pain location:  LLQ and RLQ Pain radiates to:  Does not radiate Pain severity:  Severe Onset quality:  Gradual Timing:  Constant Progression:  Unchanged Chronicity:  Chronic Relieved by:  Nothing Worsened by:  Nothing tried Associated symptoms: no chest pain, no cough, no diarrhea, no fatigue, no fever and no hematuria   Associated symptoms comment:  Feeling hot  Nafees Steuer Shiley is a 59 y.o. male with history of diverticulitis, kidney stones, and chronic back pain who presents to the Emergency Department complaining of chronic, constant, 8/10 in severity, lower abdominal pain (L>R) with onset 3 days ago. Associated symptoms include feeling hot. The patient's "feeling hot" sensation is being treated with ibuprofen with some relief. Pt denies fever and chills.   Past Medical History  Diagnosis Date  . Hypertension   . Depression   . Urinary retention   . Diabetes mellitus   . Chronic back pain   . Chronic hip pain   . Diverticulitis   . H/O kidney transplant   . Renal disorder   . Kidney stone     Past Surgical History  Procedure Laterality Date  . Kidney transplant    . Hernia repair    . Nephrectomy transplanted organ    . Colonoscopy  05/27/2003    [id number] terminal ileum,rectum, and colon  . Hip surgery      Family History  Problem Relation Age of Onset  . Hypertension Sister     Social History  Substance Use Topics  . Smoking status: Former Smoker -- 0.00  packs/day for 5 years    Quit date: 06/14/1972  . Smokeless tobacco: Never Used  . Alcohol Use: No     Review of Systems  Constitutional: Negative for fever, appetite change and fatigue.       Feeling hot  HENT: Negative for congestion, ear discharge and sinus pressure.   Eyes: Negative for discharge.  Respiratory: Negative for cough.   Cardiovascular: Negative for chest pain.  Gastrointestinal: Positive for abdominal pain. Negative for diarrhea.  Genitourinary: Negative for frequency and hematuria.  Musculoskeletal: Negative for back pain.  Skin: Negative for rash.  Neurological: Negative for seizures and headaches.  Psychiatric/Behavioral: Negative for suicidal ideas and hallucinations.   Home Medications   Prior to Admission medications   Medication Sig Start Date End Date Taking? Authorizing Provider  cycloSPORINE (SANDIMMUNE) 100 MG capsule Take 100 mg by mouth 2 (two) times daily. Take with 25 mg tablet to equal 125 mg twice daily   Yes Historical Provider, MD  cycloSPORINE (SANDIMMUNE) 25 MG capsule Take 25 mg by mouth 2 (two) times daily. Take with 100 mg tablet to equal dose of 125 mg twice daily   Yes Historical Provider, MD  esomeprazole (NEXIUM) 40 MG capsule Take 40 mg by mouth 2 (two) times daily.    Yes Historical Provider, MD  HYDROcodone-acetaminophen (NORCO/VICODIN) 5-325 MG per tablet Take 2 tablets by mouth every 4 (four) hours as needed. 03/08/15  Yes  Fransico Meadow, PA-C  Menthol, Topical Analgesic, (BENGAY EX) Apply 1 application topically as needed (pain).   Yes Historical Provider, MD  predniSONE (DELTASONE) 5 MG tablet Take 5 mg by mouth every morning.    Yes Historical Provider, MD  promethazine (PHENERGAN) 25 MG tablet Take 25 mg by mouth every 6 (six) hours as needed for nausea or vomiting.   Yes Historical Provider, MD  tamsulosin (FLOMAX) 0.4 MG CAPS capsule Take 0.4 mg by mouth daily.    Yes Historical Provider, MD  vitamin B-12 (CYANOCOBALAMIN) 1000 MCG  tablet Take 1,000 mcg by mouth daily.   Yes Historical Provider, MD  atorvastatin (LIPITOR) 10 MG tablet Take 10 mg by mouth every morning.     Historical Provider, MD  ciprofloxacin (CIPRO) 500 MG tablet Take 1 tablet (500 mg total) by mouth 2 (two) times daily. Patient not taking: Reported on 05/08/2015 03/08/15   Fransico Meadow, PA-C  metroNIDAZOLE (FLAGYL) 500 MG tablet Take 1 tablet (500 mg total) by mouth 2 (two) times daily. Patient not taking: Reported on 05/08/2015 03/08/15   Fransico Meadow, PA-C  ondansetron (ZOFRAN ODT) 4 MG disintegrating tablet Take 1 tablet (4 mg total) by mouth every 8 (eight) hours as needed for nausea or vomiting. Patient not taking: Reported on 05/08/2015 03/08/15   Fransico Meadow, PA-C    Allergies  Codeine; Nsaids; and Vasotec  Triage Vitals: BP 130/89 mmHg  Pulse 63  Temp(Src) 97.7 F (36.5 C) (Oral)  Resp 16  Ht 5\' 11"  (1.803 m)  Wt 172 lb (78.019 kg)  BMI 24.00 kg/m2  SpO2 100%  Physical Exam  Constitutional: He is oriented to person, place, and time. He appears well-developed.  HENT:  Head: Normocephalic.  Eyes: Conjunctivae and EOM are normal. No scleral icterus.  Neck: Neck supple. No thyromegaly present.  Cardiovascular: Normal rate and regular rhythm.  Exam reveals no gallop and no friction rub.   No murmur heard. Pulmonary/Chest: No stridor. He has no wheezes. He has no rales. He exhibits no tenderness.  Abdominal: He exhibits no distension. There is tenderness in the left lower quadrant. There is no rebound.  Musculoskeletal: Normal range of motion. He exhibits no edema.  Lymphadenopathy:    He has no cervical adenopathy.  Neurological: He is oriented to person, place, and time. He exhibits normal muscle tone. Coordination normal.  Skin: No rash noted. No erythema.  Psychiatric: He has a normal mood and affect. His behavior is normal.    ED Course  Procedures   DIAGNOSTIC STUDIES: Oxygen Saturation is 100% on RA, normal by my  interpretation.    COORDINATION OF CARE: 9:34 AM Discussed treatment plan which includes lab work with pt at bedside and pt agreed to plan.  Labs Reviewed  CBC WITH DIFFERENTIAL/PLATELET - Abnormal; Notable for the following:    MCV 68.8 (*)    MCH 22.6 (*)    All other components within normal limits  COMPREHENSIVE METABOLIC PANEL - Abnormal; Notable for the following:    BUN 21 (*)    Creatinine, Ser 1.44 (*)    ALT 10 (*)    GFR calc non Af Amer 51 (*)    GFR calc Af Amer 60 (*)    All other components within normal limits  URINALYSIS, ROUTINE W REFLEX MICROSCOPIC (NOT AT [facility])  CBG MONITORING, ED    Imaging Review No results found.  I personally reviewed and evaluated these lab results as a part of my medical  decision-making.    MDM   Final diagnoses:  None    Labs unremarkable. Patient's abdominal pain improving but does now complaining of lumbar spine pain. Patient has had diverticulitis many times. We will cover him with Cipro and Flagyl and given pain medicine and have a follow-up with his M.D.   The chart was scribed for me under my direct supervision.  I personally performed the history, physical, and medical decision making and all procedures in the evaluation of this patient.[name], MD 05/08/15 [phone]

## 2015-05-08 NOTE — Discharge Instructions (Signed)
Follow up with your md next week. °

## 2015-06-05 ENCOUNTER — Encounter (HOSPITAL_COMMUNITY): Payer: Self-pay | Admitting: *Deleted

## 2015-06-05 ENCOUNTER — Emergency Department (HOSPITAL_COMMUNITY)
Admission: EM | Admit: 2015-06-05 | Discharge: 2015-06-05 | Disposition: A | Discharge status: Home / Self Care | Payer: Medicare Other | Attending: Emergency Medicine | Admitting: Emergency Medicine

## 2015-06-05 DIAGNOSIS — Y9289 Other specified places as the place of occurrence of the external cause: Secondary | ICD-10-CM | POA: Insufficient documentation

## 2015-06-05 DIAGNOSIS — Z87891 Personal history of nicotine dependence: Secondary | ICD-10-CM | POA: Insufficient documentation

## 2015-06-05 DIAGNOSIS — Y998 Other external cause status: Secondary | ICD-10-CM | POA: Insufficient documentation

## 2015-06-05 DIAGNOSIS — I10 Essential (primary) hypertension: Secondary | ICD-10-CM | POA: Diagnosis not present

## 2015-06-05 DIAGNOSIS — Z940 Kidney transplant status: Secondary | ICD-10-CM | POA: Diagnosis not present

## 2015-06-05 DIAGNOSIS — Y9389 Activity, other specified: Secondary | ICD-10-CM | POA: Insufficient documentation

## 2015-06-05 DIAGNOSIS — E119 Type 2 diabetes mellitus without complications: Secondary | ICD-10-CM | POA: Diagnosis not present

## 2015-06-05 DIAGNOSIS — X500XXA Overexertion from strenuous movement or load, initial encounter: Secondary | ICD-10-CM | POA: Diagnosis not present

## 2015-06-05 DIAGNOSIS — Z87442 Personal history of urinary calculi: Secondary | ICD-10-CM | POA: Insufficient documentation

## 2015-06-05 DIAGNOSIS — G8929 Other chronic pain: Secondary | ICD-10-CM | POA: Diagnosis not present

## 2015-06-05 DIAGNOSIS — Z87448 Personal history of other diseases of urinary system: Secondary | ICD-10-CM | POA: Diagnosis not present

## 2015-06-05 DIAGNOSIS — Z8659 Personal history of other mental and behavioral disorders: Secondary | ICD-10-CM | POA: Diagnosis not present

## 2015-06-05 DIAGNOSIS — Z79899 Other long term (current) drug therapy: Secondary | ICD-10-CM | POA: Insufficient documentation

## 2015-06-05 DIAGNOSIS — S39012A Strain of muscle, fascia and tendon of lower back, initial encounter: Secondary | ICD-10-CM | POA: Insufficient documentation

## 2015-06-05 DIAGNOSIS — Z8719 Personal history of other diseases of the digestive system: Secondary | ICD-10-CM | POA: Diagnosis not present

## 2015-06-05 DIAGNOSIS — S3992XA Unspecified injury of lower back, initial encounter: Secondary | ICD-10-CM | POA: Diagnosis present

## 2015-06-05 MED ORDER — DEXAMETHASONE SODIUM PHOSPHATE 4 MG/ML IJ SOLN
8.0000 mg | Freq: Once | INTRAMUSCULAR | Status: AC
  Administered 2015-06-05: 8 mg via INTRAMUSCULAR
  Filled 2015-06-05: qty 2

## 2015-06-05 MED ORDER — HYDROCODONE-ACETAMINOPHEN 5-325 MG PO TABS
2.0000 | ORAL_TABLET | Freq: Once | ORAL | Status: AC
  Administered 2015-06-05: 2 via ORAL
  Filled 2015-06-05: qty 2

## 2015-06-05 MED ORDER — HYDROCODONE-ACETAMINOPHEN 5-325 MG PO TABS: 1.0000 | ORAL_TABLET | ORAL | Status: DC | PRN

## 2015-06-05 MED ORDER — METHOCARBAMOL 500 MG PO TABS: 500.0000 mg | ORAL_TABLET | Freq: Three times a day (TID) | ORAL | Status: DC

## 2015-06-05 MED ORDER — DEXAMETHASONE 4 MG PO TABS: 4.0000 mg | ORAL_TABLET | Freq: Two times a day (BID) | ORAL | Status: DC

## 2015-06-05 MED ORDER — PROMETHAZINE HCL 12.5 MG PO TABS
12.5000 mg | ORAL_TABLET | Freq: Once | ORAL | Status: AC
  Administered 2015-06-05: 12.5 mg via ORAL
  Filled 2015-06-05: qty 1

## 2015-06-05 MED ORDER — DIAZEPAM 5 MG PO TABS
10.0000 mg | ORAL_TABLET | Freq: Once | ORAL | Status: AC
  Administered 2015-06-05: 10 mg via ORAL
  Filled 2015-06-05: qty 2

## 2015-06-05 MED ORDER — DOXYCYCLINE HYCLATE 100 MG PO CAPS: 100.0000 mg | ORAL_CAPSULE | Freq: Two times a day (BID) | ORAL | Status: DC

## 2015-06-05 NOTE — Discharge Instructions (Signed)
Please rest your back is much as possible. Warm tub soaks, or heating pad may be helpful. Use Decadron and Robaxin daily. Use Norco for more severe pain. Please see Dr. Ninfa Linden, or the orthopedic specialist of your choice for orthopedic evaluation concerning your back. Lumbosacral Strain Lumbosacral strain is a strain of any of the parts that make up your lumbosacral vertebrae. Your lumbosacral vertebrae are the bones that make up the lower third of your backbone. Your lumbosacral vertebrae are held together by muscles and tough, fibrous tissue (ligaments).  CAUSES  A sudden blow to your back can cause lumbosacral strain. Also, anything that causes an excessive stretch of the muscles in the low back can cause this strain. This is typically seen when people exert themselves strenuously, fall, lift heavy objects, bend, or crouch repeatedly. RISK FACTORS  Physically demanding work.  Participation in pushing or pulling sports or sports that require a sudden twist of the back (tennis, golf, baseball).  Weight lifting.  Excessive lower back curvature.  Forward-tilted pelvis.  Weak back or abdominal muscles or both.  Tight hamstrings. SIGNS AND SYMPTOMS  Lumbosacral strain may cause pain in the area of your injury or pain that moves (radiates) down your leg.  DIAGNOSIS Your health care provider can often diagnose lumbosacral strain through a physical exam. In some cases, you may need tests such as X-ray exams.  TREATMENT  Treatment for your lower back injury depends on many factors that your clinician will have to evaluate. However, most treatment will include the use of anti-inflammatory medicines. HOME CARE INSTRUCTIONS   Avoid hard physical activities (tennis, racquetball, waterskiing) if you are not in proper physical condition for it. This may aggravate or create problems.  If you have a back problem, avoid sports requiring sudden body movements. Swimming and walking are generally safer  activities.  Maintain good posture.  Maintain a healthy weight.  For acute conditions, you may put ice on the injured area.  Put ice in a plastic bag.  Place a towel between your skin and the bag.  Leave the ice on for 20 minutes, 2-3 times a day.  When the low back starts healing, stretching and strengthening exercises may be recommended. SEEK MEDICAL CARE IF:  Your back pain is getting worse.  You experience severe back pain not relieved with medicines. SEEK IMMEDIATE MEDICAL CARE IF:   You have numbness, tingling, weakness, or problems with the use of your arms or legs.  There is a change in bowel or bladder control.  You have increasing pain in any area of the body, including your belly (abdomen).  You notice shortness of breath, dizziness, or feel faint.  You feel sick to your stomach (nauseous), are throwing up (vomiting), or become sweaty.  You notice discoloration of your toes or legs, or your feet get very cold. MAKE SURE YOU:   Understand these instructions.  Will watch your condition.  Will get help right away if you are not doing well or get worse.   This information is not intended to replace advice given to you by your health care provider. Make sure you discuss any questions you have with your health care provider.   Document Released: 03/10/2005 Document Revised: 06/21/2014 Document Reviewed: 01/17/2013 Elsevier Interactive Patient Education [employer].

## 2015-06-05 NOTE — ED Notes (Signed)
Pt states back pain started after lifting someone's wheel chair 10 min PTA.

## 2015-06-05 NOTE — ED Provider Notes (Signed)
CSN: [id number]     Arrival date & time 06/05/15  1350 History   First MD Initiated Contact with Patient 06/05/15 1408     Chief Complaint  Patient presents with  . Back Pain     (Consider location/radiation/quality/duration/timing/severity/associated sxs/prior Treatment) Patient is a 59 y.o. male presenting with back pain. The history is provided by the patient.  Back Pain Location:  Lumbar spine Quality:  Shooting Pain severity:  Moderate Pain is:  Same all the time Onset quality:  Sudden Timing:  Intermittent Progression:  Worsening Chronicity: acute on chronic. Context: lifting heavy objects   Relieved by:  Nothing Worsened by:  Movement Ineffective treatments:  None tried Associated symptoms: no bladder incontinence, no bowel incontinence and no perianal numbness   Risk factors: no recent surgery     Past Medical History  Diagnosis Date  . Hypertension   . Depression   . Urinary retention   . Diabetes mellitus   . Chronic back pain   . Chronic hip pain   . Diverticulitis   . H/O kidney transplant   . Renal disorder   . Kidney stone    Past Surgical History  Procedure Laterality Date  . Kidney transplant    . Hernia repair    . Nephrectomy transplanted organ    . Colonoscopy  05/27/2003    [id number] terminal ileum,rectum, and colon  . Hip surgery     Family History  Problem Relation Age of Onset  . Hypertension Sister    Social History  Substance Use Topics  . Smoking status: Former Smoker -- 0.00 packs/day for 5 years    Quit date: 06/14/1972  . Smokeless tobacco: Never Used  . Alcohol Use: No    Review of Systems  Gastrointestinal: Negative for bowel incontinence.  Genitourinary: Negative for bladder incontinence.  Musculoskeletal: Positive for back pain.  All other systems reviewed and are negative.     Allergies  Codeine; Nsaids; Tape; and Vasotec  Home Medications   Prior to Admission medications   Medication Sig Start Date End  Date Taking? Authorizing Provider  atorvastatin (LIPITOR) 10 MG tablet Take 10 mg by mouth every morning.     Historical Provider, MD  ciprofloxacin (CIPRO) 500 MG tablet Take 1 tablet (500 mg total) by mouth 2 (two) times daily. One po bid x 7 days 05/08/15   [name], MD  cycloSPORINE (SANDIMMUNE) 100 MG capsule Take 100 mg by mouth 2 (two) times daily. Take with 25 mg tablet to equal 125 mg twice daily    Historical Provider, MD  cycloSPORINE (SANDIMMUNE) 25 MG capsule Take 25 mg by mouth 2 (two) times daily. Take with 100 mg tablet to equal dose of 125 mg twice daily    Historical Provider, MD  esomeprazole (NEXIUM) 40 MG capsule Take 40 mg by mouth 2 (two) times daily.     Historical Provider, MD  HYDROcodone-acetaminophen (NORCO/VICODIN) 5-325 MG tablet Take 1 tablet by mouth every 6 (six) hours as needed. 05/08/15   [name], MD  Menthol, Topical Analgesic, (BENGAY EX) Apply 1 application topically as needed (pain).    Historical Provider, MD  metroNIDAZOLE (FLAGYL) 500 MG tablet Take 1 tablet (500 mg total) by mouth 4 (four) times daily. One po bid x 7 days 05/08/15   [name], MD  ondansetron (ZOFRAN ODT) 4 MG disintegrating tablet Take 1 tablet (4 mg total) by mouth every 8 (eight) hours as needed for nausea or vomiting. Patient not taking: Reported on  05/08/2015 03/08/15   Fransico Meadow, PA-C  predniSONE (DELTASONE) 5 MG tablet Take 5 mg by mouth every morning.     Historical Provider, MD  promethazine (PHENERGAN) 25 MG tablet Take 25 mg by mouth every 6 (six) hours as needed for nausea or vomiting.    Historical Provider, MD  tamsulosin (FLOMAX) 0.4 MG CAPS capsule Take 0.4 mg by mouth daily.     Historical Provider, MD  vitamin B-12 (CYANOCOBALAMIN) 1000 MCG tablet Take 1,000 mcg by mouth daily.    Historical Provider, MD   BP 111/71 mmHg  Pulse 82  Temp(Src) 98 F (36.7 C) (Oral)  Resp 16  Ht 5\' 11"  (1.803 m)  Wt 79.379 kg  BMI 24.42 kg/m2  SpO2 100% Physical  Exam  Constitutional: He is oriented to person, place, and time. He appears well-developed and well-nourished.  Non-toxic appearance.  HENT:  Head: Normocephalic.  Right Ear: Tympanic membrane and external ear normal.  Left Ear: Tympanic membrane and external ear normal.  Eyes: EOM and lids are normal. Pupils are equal, round, and reactive to light.  Neck: Normal range of motion. Neck supple. Carotid bruit is not present.  Cardiovascular: Normal rate, regular rhythm, normal heart sounds, intact distal pulses and normal pulses.   Pulmonary/Chest: Breath sounds normal. No respiratory distress.  Abdominal: Soft. Bowel sounds are normal. There is no tenderness. There is no guarding.  Musculoskeletal:       Lumbar back: He exhibits decreased range of motion, pain and spasm.  Pain of the right lower back is worse with attempting to put weight on the right lower extremity.  Lymphadenopathy:       Head (right side): No submandibular adenopathy present.       Head (left side): No submandibular adenopathy present.    He has no cervical adenopathy.  Neurological: He is alert and oriented to person, place, and time. He has normal strength. No cranial nerve deficit or sensory deficit.  No gross motor or sensory deficits appreciated.  Skin: Skin is warm and dry.  Psychiatric: He has a normal mood and affect. His speech is normal.  Nursing note and vitals reviewed.   ED Course  Procedures (including critical care time) Labs Review Labs Reviewed - No data to display  Imaging Review No results found. I have personally reviewed and evaluated these images and lab results as part of my medical decision-making.   EKG Interpretation None      MDM  Patient has spasm and pain of the lower active, right more than left. Patient will be treated with muscle relaxer, pain medication, and steroid. Patient strongly encouraged to see the orthopedic specialist for additional evaluation of his back pain.     Final diagnoses:  None    *I have reviewed nursing notes, vital signs, and all appropriate lab and imaging results for this patient.[address] PA-C 06/05/15 1454  Julianne Rice, MD 06/07/15 1032

## 2015-07-15 DIAGNOSIS — Z4822 Encounter for aftercare following kidney transplant: Secondary | ICD-10-CM | POA: Diagnosis not present

## 2015-07-15 DIAGNOSIS — D899 Disorder involving the immune mechanism, unspecified: Secondary | ICD-10-CM | POA: Diagnosis not present

## 2015-07-15 DIAGNOSIS — Z87898 Personal history of other specified conditions: Secondary | ICD-10-CM | POA: Diagnosis not present

## 2015-07-15 DIAGNOSIS — N186 End stage renal disease: Secondary | ICD-10-CM | POA: Diagnosis not present

## 2015-07-15 DIAGNOSIS — Z940 Kidney transplant status: Secondary | ICD-10-CM | POA: Diagnosis not present

## 2015-07-15 DIAGNOSIS — E785 Hyperlipidemia, unspecified: Secondary | ICD-10-CM | POA: Diagnosis not present

## 2015-07-15 DIAGNOSIS — Z87891 Personal history of nicotine dependence: Secondary | ICD-10-CM | POA: Diagnosis not present

## 2015-07-15 DIAGNOSIS — I120 Hypertensive chronic kidney disease with stage 5 chronic kidney disease or end stage renal disease: Secondary | ICD-10-CM | POA: Diagnosis not present

## 2015-07-15 DIAGNOSIS — M25552 Pain in left hip: Secondary | ICD-10-CM | POA: Diagnosis not present

## 2015-07-15 DIAGNOSIS — Z79899 Other long term (current) drug therapy: Secondary | ICD-10-CM | POA: Diagnosis not present

## 2015-07-15 DIAGNOSIS — D631 Anemia in chronic kidney disease: Secondary | ICD-10-CM | POA: Diagnosis not present

## 2015-07-17 DIAGNOSIS — Z79899 Other long term (current) drug therapy: Secondary | ICD-10-CM | POA: Diagnosis not present

## 2015-07-17 DIAGNOSIS — Z4822 Encounter for aftercare following kidney transplant: Secondary | ICD-10-CM | POA: Diagnosis not present

## 2015-09-22 DIAGNOSIS — Z6826 Body mass index (BMI) 26.0-26.9, adult: Secondary | ICD-10-CM | POA: Diagnosis not present

## 2015-09-22 DIAGNOSIS — I10 Essential (primary) hypertension: Secondary | ICD-10-CM | POA: Diagnosis not present

## 2015-09-22 DIAGNOSIS — M25552 Pain in left hip: Secondary | ICD-10-CM | POA: Diagnosis not present

## 2015-11-04 ENCOUNTER — Encounter (HOSPITAL_COMMUNITY): Payer: Self-pay | Admitting: Emergency Medicine

## 2015-11-04 ENCOUNTER — Emergency Department (HOSPITAL_COMMUNITY)
Admission: EM | Admit: 2015-11-04 | Discharge: 2015-11-04 | Disposition: A | Discharge status: Home / Self Care | Payer: Medicare Other | Attending: Emergency Medicine | Admitting: Emergency Medicine

## 2015-11-04 ENCOUNTER — Emergency Department (HOSPITAL_COMMUNITY): Payer: Medicare Other

## 2015-11-04 DIAGNOSIS — Z87891 Personal history of nicotine dependence: Secondary | ICD-10-CM | POA: Diagnosis not present

## 2015-11-04 DIAGNOSIS — Z79899 Other long term (current) drug therapy: Secondary | ICD-10-CM | POA: Diagnosis not present

## 2015-11-04 DIAGNOSIS — F329 Major depressive disorder, single episode, unspecified: Secondary | ICD-10-CM | POA: Diagnosis not present

## 2015-11-04 DIAGNOSIS — B349 Viral infection, unspecified: Secondary | ICD-10-CM | POA: Diagnosis not present

## 2015-11-04 DIAGNOSIS — R05 Cough: Secondary | ICD-10-CM | POA: Diagnosis not present

## 2015-11-04 DIAGNOSIS — E119 Type 2 diabetes mellitus without complications: Secondary | ICD-10-CM | POA: Insufficient documentation

## 2015-11-04 DIAGNOSIS — I10 Essential (primary) hypertension: Secondary | ICD-10-CM | POA: Insufficient documentation

## 2015-11-04 DIAGNOSIS — R51 Headache: Secondary | ICD-10-CM | POA: Diagnosis present

## 2015-11-04 MED ORDER — ACETAMINOPHEN 325 MG PO TABS
650.0000 mg | ORAL_TABLET | Freq: Once | ORAL | Status: AC
  Administered 2015-11-04: 650 mg via ORAL
  Filled 2015-11-04: qty 2

## 2015-11-04 MED ORDER — BENZONATATE 100 MG PO CAPS
100.0000 mg | ORAL_CAPSULE | Freq: Once | ORAL | Status: AC
  Administered 2015-11-04: 100 mg via ORAL
  Filled 2015-11-04: qty 1

## 2015-11-04 MED ORDER — ACETAMINOPHEN 325 MG PO TABS: 650.0000 mg | ORAL_TABLET | Freq: Four times a day (QID) | ORAL | Status: DC | PRN

## 2015-11-04 MED ORDER — BENZONATATE 100 MG PO CAPS: 100.0000 mg | ORAL_CAPSULE | Freq: Two times a day (BID) | ORAL | Status: AC

## 2015-11-04 MED ORDER — LORATADINE 10 MG PO TABS
10.0000 mg | ORAL_TABLET | Freq: Once | ORAL | Status: AC
Start: 2015-11-04 — End: 2015-11-04
  Administered 2015-11-04: 10 mg via ORAL
  Filled 2015-11-04: qty 1

## 2015-11-04 MED ORDER — LORATADINE 10 MG PO TABS: 10.0000 mg | ORAL_TABLET | Freq: Every day | ORAL | Status: DC

## 2015-11-04 MED ORDER — ALBUTEROL SULFATE HFA 108 (90 BASE) MCG/ACT IN AERS
2.0000 | INHALATION_SPRAY | Freq: Once | RESPIRATORY_TRACT | Status: AC
  Administered 2015-11-04: 2 via RESPIRATORY_TRACT
  Filled 2015-11-04: qty 6.7

## 2015-11-04 NOTE — ED Notes (Signed)
Pt c/o headache x2 days

## 2015-11-04 NOTE — ED Notes (Signed)
Pt given warm blanket.

## 2015-11-04 NOTE — ED Provider Notes (Signed)
CSN: [id number]     Arrival date & time 11/04/15  0223 History   First MD Initiated Contact with Patient 11/04/15 0253     Chief Complaint  Patient presents with  . Headache     (Consider location/radiation/quality/duration/timing/severity/associated sxs/prior Treatment) Patient is a 60 y.o. male presenting with URI.  URI Presenting symptoms: congestion, cough, facial pain, rhinorrhea and sore throat   Presenting symptoms: no fever   Severity:  Mild Onset quality:  Gradual Duration:  2 days Timing:  Constant Progression:  Worsening Chronicity:  Recurrent Relieved by:  None tried Worsened by:  Nothing tried Ineffective treatments:  None tried Associated symptoms: headaches   Risk factors: chronic kidney disease   Risk factors: no chronic cardiac disease, no chronic respiratory disease and no immunosuppression     Past Medical History  Diagnosis Date  . Hypertension   . Depression   . Urinary retention   . Diabetes mellitus   . Chronic back pain   . Chronic hip pain   . Diverticulitis   . H/O kidney transplant   . Renal disorder   . Kidney stone    Past Surgical History  Procedure Laterality Date  . Kidney transplant    . Hernia repair    . Nephrectomy transplanted organ    . Colonoscopy  05/27/2003    [id number] terminal ileum,rectum, and colon  . Hip surgery     Family History  Problem Relation Age of Onset  . Hypertension Sister    Social History  Substance Use Topics  . Smoking status: Former Smoker -- 0.00 packs/day for 5 years    Quit date: 06/14/1972  . Smokeless tobacco: Never Used  . Alcohol Use: No    Review of Systems  Constitutional: Negative for fever.  HENT: Positive for congestion, rhinorrhea and sore throat.   Respiratory: Positive for cough.   Neurological: Positive for headaches.  All other systems reviewed and are negative.     Allergies  Codeine; Enalapril maleate; Nsaids; Tape; and Vasotec  Home Medications   Prior to  Admission medications   Medication Sig Start Date End Date Taking? Authorizing Provider  acetaminophen (TYLENOL) 325 MG tablet Take 2 tablets (650 mg total) by mouth every 6 (six) hours as needed for mild pain, fever or headache. 11/04/15   [name], MD  atorvastatin (LIPITOR) 10 MG tablet Take 10 mg by mouth every morning.     Historical Provider, MD  benzonatate (TESSALON) 100 MG capsule Take 1 capsule (100 mg total) by mouth 2 (two) times daily. 11/04/15 11/11/15  [name], MD  ciprofloxacin (CIPRO) 500 MG tablet Take 1 tablet (500 mg total) by mouth 2 (two) times daily. One po bid x 7 days Patient not taking: Reported on 06/05/2015 05/08/15   [name], MD  cycloSPORINE (SANDIMMUNE) 100 MG capsule Take 100 mg by mouth 2 (two) times daily. Take with 25 mg tablet to equal 125 mg twice daily    Historical Provider, MD  cycloSPORINE (SANDIMMUNE) 25 MG capsule Take 25 mg by mouth 2 (two) times daily. Take with 100 mg tablet to equal dose of 125 mg twice daily    Historical Provider, MD  dexamethasone (DECADRON) 4 MG tablet Take 1 tablet (4 mg total) by mouth 2 (two) times daily with a meal. 06/05/15   [name], PA-C  esomeprazole (NEXIUM) 40 MG capsule Take 40 mg by mouth 2 (two) times daily.     Historical Provider, MD  HYDROcodone-acetaminophen (NORCO/VICODIN) 5-325 MG tablet  Take 1 tablet by mouth every 4 (four) hours as needed. 06/05/15   [name], PA-C  loratadine (CLARITIN) 10 MG tablet Take 1 tablet (10 mg total) by mouth daily. 11/04/15 11/11/15  [name], MD  Menthol, Topical Analgesic, (BENGAY EX) Apply 1 application topically as needed (pain).    Historical Provider, MD  methocarbamol (ROBAXIN) 500 MG tablet Take 1 tablet (500 mg total) by mouth 3 (three) times daily. 06/05/15   [name], PA-C  predniSONE (DELTASONE) 5 MG tablet Take 5 mg by mouth every morning.     Historical Provider, MD  promethazine (PHENERGAN) 25 MG tablet Take 25 mg by mouth every 6 (six)  hours as needed for nausea or vomiting.    Historical Provider, MD  tamsulosin (FLOMAX) 0.4 MG CAPS capsule Take 0.4 mg by mouth daily.     Historical Provider, MD  vitamin B-12 (CYANOCOBALAMIN) 1000 MCG tablet Take 1,000 mcg by mouth daily.    Historical Provider, MD   BP 95/85 mmHg  Pulse 63  Temp(Src) 98 F (36.7 C)  Resp 20  Ht 5\' 11"  (1.803 m)  Wt 182 lb (82.555 kg)  BMI 25.40 kg/m2  SpO2 100% Physical Exam  Constitutional: He is oriented to person, place, and time. He appears well-developed and well-nourished.  HENT:  Head: Normocephalic and atraumatic.  Nose: Mucosal edema and rhinorrhea present.  Mouth/Throat: Mucous membranes are not pale and not dry. No oral lesions. No dental abscesses. Posterior oropharyngeal erythema present.  Neck: Normal range of motion.  Cardiovascular: Normal rate and regular rhythm.   Pulmonary/Chest: Effort normal. No respiratory distress.  Abdominal: Soft. He exhibits no distension. There is no tenderness.  Musculoskeletal: Normal range of motion. He exhibits no edema or tenderness.  Neurological: He is alert and oriented to person, place, and time.  Skin: Skin is warm and dry.  Nursing note and vitals reviewed.   ED Course  Procedures (including critical care time) Labs Review Labs Reviewed - No data to display  Imaging Review Dg Chest 2 View  11/04/2015  CLINICAL DATA:  Initial evaluation for acute headache, or productive cough, fever. EXAM: CHEST  2 VIEW COMPARISON:  Prior study from 01/27/2015. FINDINGS: The cardiac and mediastinal silhouettes are stable in size and contour, and remain within normal limits. Atheromatous plaque within the aortic arch. The lungs are normally inflated. No airspace consolidation, pleural effusion, or pulmonary edema is identified. There is no pneumothorax. No acute osseous abnormality identified. IMPRESSION: No active cardiopulmonary disease. Electronically Signed   By: Jeannine Boga M.D.   On:  11/04/2015 05:06   I have personally reviewed and evaluated these images and lab results as part of my medical decision-making.   EKG Interpretation None      MDM   Final diagnoses:  Viral illness    Likely URI. Doubt emergent secondary headache. Will treat with tylenol, claritin, albuterol. CXR to ensure no pneunmonia. No indication for abx at this time. Will advise pcp follow up if cxr ok.   cxr ok. Symptoms improved in ED. Likely viral, flu like illness. Will dc on supportive care with pcp follow up.   New Prescriptions: Discharge Medication List as of 11/04/2015  5:02 AM    START taking these medications   Details  acetaminophen (TYLENOL) 325 MG tablet Take 2 tablets (650 mg total) by mouth every 6 (six) hours as needed for mild pain, fever or headache., Starting 11/04/2015, Until Discontinued, Print    benzonatate (TESSALON) 100 MG capsule Take  1 capsule (100 mg total) by mouth 2 (two) times daily., Starting 11/04/2015, Until Tue 11/11/15, Print    loratadine (CLARITIN) 10 MG tablet Take 1 tablet (10 mg total) by mouth daily., Starting 11/04/2015, Until Tue 11/11/15, Print         I have personally and contemperaneously reviewed labs and imaging and used in my decision making as above.   A medical screening exam was performed and I feel the patient has had an appropriate workup for their chief complaint at this time and likelihood of emergent condition existing is low and thus workup can continue on an outpatient basis.. Their vital signs are stable. They have been counseled on decision, discharge, follow up and which symptoms necessitate immediate return to the emergency department.  They verbally stated understanding and agreement with plan and discharged in stable condition.    [name], MD 11/04/15 [phone]

## 2015-11-18 DIAGNOSIS — I120 Hypertensive chronic kidney disease with stage 5 chronic kidney disease or end stage renal disease: Secondary | ICD-10-CM | POA: Diagnosis not present

## 2015-11-18 DIAGNOSIS — D509 Iron deficiency anemia, unspecified: Secondary | ICD-10-CM | POA: Diagnosis not present

## 2015-11-18 DIAGNOSIS — Z4822 Encounter for aftercare following kidney transplant: Secondary | ICD-10-CM | POA: Diagnosis not present

## 2015-11-18 DIAGNOSIS — E559 Vitamin D deficiency, unspecified: Secondary | ICD-10-CM | POA: Diagnosis not present

## 2015-11-18 DIAGNOSIS — Z940 Kidney transplant status: Secondary | ICD-10-CM | POA: Diagnosis not present

## 2015-11-18 DIAGNOSIS — Z87442 Personal history of urinary calculi: Secondary | ICD-10-CM | POA: Diagnosis not present

## 2015-11-18 DIAGNOSIS — Z7952 Long term (current) use of systemic steroids: Secondary | ICD-10-CM | POA: Diagnosis not present

## 2015-11-18 DIAGNOSIS — N186 End stage renal disease: Secondary | ICD-10-CM | POA: Diagnosis not present

## 2015-11-18 DIAGNOSIS — Z79899 Other long term (current) drug therapy: Secondary | ICD-10-CM | POA: Diagnosis not present

## 2015-11-18 DIAGNOSIS — Z87891 Personal history of nicotine dependence: Secondary | ICD-10-CM | POA: Diagnosis not present

## 2015-11-18 DIAGNOSIS — D631 Anemia in chronic kidney disease: Secondary | ICD-10-CM | POA: Diagnosis not present

## 2015-11-18 DIAGNOSIS — E785 Hyperlipidemia, unspecified: Secondary | ICD-10-CM | POA: Diagnosis not present

## 2015-11-18 DIAGNOSIS — Z992 Dependence on renal dialysis: Secondary | ICD-10-CM | POA: Diagnosis not present

## 2015-12-01 DIAGNOSIS — Z940 Kidney transplant status: Secondary | ICD-10-CM | POA: Diagnosis not present

## 2015-12-01 DIAGNOSIS — N189 Chronic kidney disease, unspecified: Secondary | ICD-10-CM | POA: Diagnosis not present

## 2015-12-01 DIAGNOSIS — Z79899 Other long term (current) drug therapy: Secondary | ICD-10-CM | POA: Diagnosis not present

## 2015-12-18 DIAGNOSIS — N186 End stage renal disease: Secondary | ICD-10-CM | POA: Diagnosis not present

## 2015-12-18 DIAGNOSIS — Z888 Allergy status to other drugs, medicaments and biological substances status: Secondary | ICD-10-CM | POA: Diagnosis not present

## 2015-12-18 DIAGNOSIS — Z885 Allergy status to narcotic agent status: Secondary | ICD-10-CM | POA: Diagnosis not present

## 2015-12-18 DIAGNOSIS — Z09 Encounter for follow-up examination after completed treatment for conditions other than malignant neoplasm: Secondary | ICD-10-CM | POA: Diagnosis not present

## 2015-12-18 DIAGNOSIS — Z87891 Personal history of nicotine dependence: Secondary | ICD-10-CM | POA: Diagnosis not present

## 2015-12-18 DIAGNOSIS — Z992 Dependence on renal dialysis: Secondary | ICD-10-CM | POA: Diagnosis not present

## 2015-12-18 DIAGNOSIS — Z940 Kidney transplant status: Secondary | ICD-10-CM | POA: Diagnosis not present

## 2015-12-18 DIAGNOSIS — Z471 Aftercare following joint replacement surgery: Secondary | ICD-10-CM | POA: Diagnosis not present

## 2015-12-18 DIAGNOSIS — I120 Hypertensive chronic kidney disease with stage 5 chronic kidney disease or end stage renal disease: Secondary | ICD-10-CM | POA: Diagnosis not present

## 2015-12-18 DIAGNOSIS — Z96642 Presence of left artificial hip joint: Secondary | ICD-10-CM | POA: Diagnosis not present

## 2015-12-24 DIAGNOSIS — I10 Essential (primary) hypertension: Secondary | ICD-10-CM | POA: Diagnosis not present

## 2015-12-24 DIAGNOSIS — Z940 Kidney transplant status: Secondary | ICD-10-CM | POA: Diagnosis not present

## 2015-12-24 DIAGNOSIS — K219 Gastro-esophageal reflux disease without esophagitis: Secondary | ICD-10-CM | POA: Diagnosis not present

## 2016-01-13 DIAGNOSIS — I10 Essential (primary) hypertension: Secondary | ICD-10-CM | POA: Diagnosis not present

## 2016-01-16 ENCOUNTER — Encounter (HOSPITAL_COMMUNITY): Payer: Self-pay | Admitting: Emergency Medicine

## 2016-01-16 ENCOUNTER — Emergency Department (HOSPITAL_COMMUNITY)
Admission: EM | Admit: 2016-01-16 | Discharge: 2016-01-16 | Disposition: A | Discharge status: Home / Self Care | Payer: Medicare Other | Attending: Emergency Medicine | Admitting: Emergency Medicine

## 2016-01-16 ENCOUNTER — Emergency Department (HOSPITAL_COMMUNITY): Payer: Medicare Other

## 2016-01-16 DIAGNOSIS — E119 Type 2 diabetes mellitus without complications: Secondary | ICD-10-CM | POA: Insufficient documentation

## 2016-01-16 DIAGNOSIS — M779 Enthesopathy, unspecified: Secondary | ICD-10-CM | POA: Insufficient documentation

## 2016-01-16 DIAGNOSIS — M79631 Pain in right forearm: Secondary | ICD-10-CM | POA: Diagnosis not present

## 2016-01-16 DIAGNOSIS — Z87891 Personal history of nicotine dependence: Secondary | ICD-10-CM | POA: Diagnosis not present

## 2016-01-16 DIAGNOSIS — I10 Essential (primary) hypertension: Secondary | ICD-10-CM | POA: Insufficient documentation

## 2016-01-16 DIAGNOSIS — M79601 Pain in right arm: Secondary | ICD-10-CM | POA: Diagnosis not present

## 2016-01-16 MED ORDER — TRAMADOL HCL 50 MG PO TABS: 50.0000 mg | ORAL_TABLET | Freq: Four times a day (QID) | ORAL | 0 refills | Status: DC | PRN

## 2016-01-16 NOTE — ED Triage Notes (Signed)
Pt states that he has had R hand and arm pain since yesterday morning but that it is worse today. Pt denies injury.

## 2016-01-16 NOTE — ED Provider Notes (Signed)
[place] DEPT Provider Note   CSN: [id number] Arrival date & time: 01/16/16  [id number]  First Provider Contact:  First MD Initiated Contact with Patient 01/16/16 0327        History   Chief Complaint Chief Complaint  Patient presents with  . hand/arm pain    HPI Javarian Wizner Neuroth is a 60 y.o. male.  Patient presents with complaints of arm pain. Patient reports that pain began yesterday morning and has worsened overnight. Patient denies any injury. He is complaining of severe pain from the palm of the hand up the forearm, worsens with any movement of the wrist.      Past Medical History:  Diagnosis Date  . Chronic back pain   . Chronic hip pain   . Depression   . Diabetes mellitus   . Diverticulitis   . H/O kidney transplant   . Hypertension   . Kidney stone   . Renal disorder   . Urinary retention     There are no active problems to display for this patient.   Past Surgical History:  Procedure Laterality Date  . COLONOSCOPY  05/27/2003   [id number] terminal ileum,rectum, and colon  . HERNIA REPAIR    . HIP SURGERY    . KIDNEY TRANSPLANT    . NEPHRECTOMY TRANSPLANTED ORGAN         Home Medications    Prior to Admission medications   Medication Sig Start Date End Date Taking? Authorizing Provider  acetaminophen (TYLENOL) 325 MG tablet Take 2 tablets (650 mg total) by mouth every 6 (six) hours as needed for mild pain, fever or headache. 11/04/15   [name], MD  atorvastatin (LIPITOR) 10 MG tablet Take 10 mg by mouth every morning.     Historical Provider, MD  ciprofloxacin (CIPRO) 500 MG tablet Take 1 tablet (500 mg total) by mouth 2 (two) times daily. One po bid x 7 days Patient not taking: Reported on 06/05/2015 05/08/15   [name], MD  cycloSPORINE (SANDIMMUNE) 100 MG capsule Take 100 mg by mouth 2 (two) times daily. Take with 25 mg tablet to equal 125 mg twice daily    Historical Provider, MD  cycloSPORINE (SANDIMMUNE) 25 MG capsule Take 25 mg  by mouth 2 (two) times daily. Take with 100 mg tablet to equal dose of 125 mg twice daily    Historical Provider, MD  dexamethasone (DECADRON) 4 MG tablet Take 1 tablet (4 mg total) by mouth 2 (two) times daily with a meal. 06/05/15   [name], PA-C  esomeprazole (NEXIUM) 40 MG capsule Take 40 mg by mouth 2 (two) times daily.     Historical Provider, MD  HYDROcodone-acetaminophen (NORCO/VICODIN) 5-325 MG tablet Take 1 tablet by mouth every 4 (four) hours as needed. 06/05/15   [name], PA-C  loratadine (CLARITIN) 10 MG tablet Take 1 tablet (10 mg total) by mouth daily. 11/04/15 11/11/15  [name], MD  Menthol, Topical Analgesic, (BENGAY EX) Apply 1 application topically as needed (pain).    Historical Provider, MD  methocarbamol (ROBAXIN) 500 MG tablet Take 1 tablet (500 mg total) by mouth 3 (three) times daily. 06/05/15   [name], PA-C  predniSONE (DELTASONE) 5 MG tablet Take 5 mg by mouth every morning.     Historical Provider, MD  promethazine (PHENERGAN) 25 MG tablet Take 25 mg by mouth every 6 (six) hours as needed for nausea or vomiting.    Historical Provider, MD  tamsulosin (FLOMAX) 0.4 MG CAPS capsule Take 0.4  mg by mouth daily.     Historical Provider, MD  vitamin B-12 (CYANOCOBALAMIN) 1000 MCG tablet Take 1,000 mcg by mouth daily.    Historical Provider, MD    Family History Family History  Problem Relation Age of Onset  . Hypertension Sister     Social History Social History  Substance Use Topics  . Smoking status: Former Smoker    Packs/day: 0.00    Years: 5.00    Quit date: 06/14/1972  . Smokeless tobacco: Never Used  . Alcohol use No     Allergies   Codeine; Enalapril maleate; Nsaids; Tape; and Vasotec   Review of Systems Review of Systems  Musculoskeletal: Positive for arthralgias.  All other systems reviewed and are negative.    Physical Exam Updated Vital Signs There were no vitals taken for this visit.  Physical Exam  Constitutional:  He is oriented to person, place, and time. He appears well-developed and well-nourished. No distress.  HENT:  Head: Normocephalic and atraumatic.  Right Ear: Hearing normal.  Left Ear: Hearing normal.  Nose: Nose normal.  Mouth/Throat: Oropharynx is clear and moist and mucous membranes are normal.  Eyes: Conjunctivae and EOM are normal. Pupils are equal, round, and reactive to light.  Neck: Normal range of motion. Neck supple.  Cardiovascular: Regular rhythm, S1 normal and S2 normal.  Exam reveals no gallop and no friction rub.   No murmur heard. Pulmonary/Chest: Effort normal and breath sounds normal. No respiratory distress. He exhibits no tenderness.  Abdominal: Soft. Normal appearance and bowel sounds are normal. There is no hepatosplenomegaly. There is no tenderness. There is no rebound, no guarding, no tenderness at McBurney's point and negative Murphy's sign. No hernia.  Musculoskeletal:       Right wrist: He exhibits decreased range of motion and tenderness. He exhibits no swelling, no effusion, no crepitus, no deformity and no laceration.  Tenderness from the palm of the hand across the wrist and up the forearm diffusely  No erythema, warmth, swelling, abrasion, bruising  Neurological: He is alert and oriented to person, place, and time. He has normal strength. No cranial nerve deficit or sensory deficit. Coordination normal. GCS eye subscore is 4. GCS verbal subscore is 5. GCS motor subscore is 6.  Skin: Skin is warm, dry and intact. No rash noted. No cyanosis.  Psychiatric: He has a normal mood and affect. His speech is normal and behavior is normal. Thought content normal.  Nursing note and vitals reviewed.    ED Treatments / Results  Labs (all labs ordered are listed, but only abnormal results are displayed) Labs Reviewed - No data to display  EKG  EKG Interpretation None       Radiology No results found.  Procedures Procedures (including critical care  time)  Medications Ordered in ED Medications - No data to display   Initial Impression / Assessment and Plan / ED Course  I have reviewed the triage vital signs and the nursing notes.  Pertinent labs & imaging results that were available during my care of the patient were reviewed by me and considered in my medical decision making (see chart for details).  Clinical Course    Patient complaining of right arm pain of unclear etiology. He denies any injury. External examination is entirely normal. There is no overlying skin changes to suggest infection. Patient does not have any swelling, deformity. Patient appears to have severe tenderness, however, with palpation anywhere from the palm of the hand to the proximal forearm  on the volar aspect. He is resistant to moving the wrist secondary to pain. There is no sign of joint effusion or concern for joint infection at this time based on exam.  Final Clinical Impressions(s) / ED Diagnoses   Final diagnoses:  None  Arthralgia  New Prescriptions New Prescriptions   No medications on file     Orpah Greek, MD 01/16/16 [phone]

## 2016-01-24 ENCOUNTER — Emergency Department (HOSPITAL_COMMUNITY)
Admission: EM | Admit: 2016-01-24 | Discharge: 2016-01-24 | Disposition: A | Discharge status: Home / Self Care | Payer: Medicare Other | Attending: Emergency Medicine | Admitting: Emergency Medicine

## 2016-01-24 ENCOUNTER — Encounter (HOSPITAL_COMMUNITY): Payer: Self-pay

## 2016-01-24 DIAGNOSIS — Z87891 Personal history of nicotine dependence: Secondary | ICD-10-CM | POA: Insufficient documentation

## 2016-01-24 DIAGNOSIS — E119 Type 2 diabetes mellitus without complications: Secondary | ICD-10-CM | POA: Diagnosis not present

## 2016-01-24 DIAGNOSIS — H00011 Hordeolum externum right upper eyelid: Secondary | ICD-10-CM | POA: Diagnosis not present

## 2016-01-24 DIAGNOSIS — Z79899 Other long term (current) drug therapy: Secondary | ICD-10-CM | POA: Insufficient documentation

## 2016-01-24 DIAGNOSIS — H00013 Hordeolum externum right eye, unspecified eyelid: Secondary | ICD-10-CM | POA: Diagnosis not present

## 2016-01-24 DIAGNOSIS — I10 Essential (primary) hypertension: Secondary | ICD-10-CM | POA: Insufficient documentation

## 2016-01-24 DIAGNOSIS — H578 Other specified disorders of eye and adnexa: Secondary | ICD-10-CM | POA: Diagnosis present

## 2016-01-24 MED ORDER — ERYTHROMYCIN 5 MG/GM OP OINT: TOPICAL_OINTMENT | OPHTHALMIC | 0 refills | Status: DC

## 2016-01-24 MED ORDER — ERYTHROMYCIN 5 MG/GM OP OINT: TOPICAL_OINTMENT | OPHTHALMIC | 1 refills | Status: DC

## 2016-01-24 NOTE — Discharge Instructions (Signed)
Recommend warm compresses to the right eye using antibiotic ointment as directed. Return for any new or worse symptoms.

## 2016-01-24 NOTE — ED Provider Notes (Signed)
[place] DEPT Provider Note   CSN: [id number] Arrival date & time: 01/24/16  1315  First Provider Contact:  First MD Initiated Contact with Patient 01/24/16 1329        History   Chief Complaint Chief Complaint  Patient presents with  . Burning Eyes    HPI Robert Durham is a 60 y.o. male.  Patient with complaint of swelling to the right upper eyelid for 1 week. Associated with pain and burning sensation. Patient does not wear contacts. No history of injury. No eyeball pain.      Past Medical History:  Diagnosis Date  . Chronic back pain   . Chronic hip pain   . Depression   . Diabetes mellitus   . Diverticulitis   . H/O kidney transplant   . Hypertension   . Kidney stone   . Renal disorder   . Urinary retention     There are no active problems to display for this patient.   Past Surgical History:  Procedure Laterality Date  . COLONOSCOPY  05/27/2003   [id number] terminal ileum,rectum, and colon  . HERNIA REPAIR    . HIP SURGERY    . KIDNEY TRANSPLANT    . NEPHRECTOMY TRANSPLANTED ORGAN         Home Medications    Prior to Admission medications   Medication Sig Start Date End Date Taking? Authorizing Provider  acetaminophen (TYLENOL) 325 MG tablet Take 2 tablets (650 mg total) by mouth every 6 (six) hours as needed for mild pain, fever or headache. 11/04/15   [name], MD  atorvastatin (LIPITOR) 10 MG tablet Take 10 mg by mouth every morning.     Historical Provider, MD  ciprofloxacin (CIPRO) 500 MG tablet Take 1 tablet (500 mg total) by mouth 2 (two) times daily. One po bid x 7 days Patient not taking: Reported on 06/05/2015 05/08/15   [name], MD  cycloSPORINE (SANDIMMUNE) 100 MG capsule Take 100 mg by mouth 2 (two) times daily. Take with 25 mg tablet to equal 125 mg twice daily    Historical Provider, MD  cycloSPORINE (SANDIMMUNE) 25 MG capsule Take 25 mg by mouth 2 (two) times daily. Take with 100 mg tablet to equal dose of 125 mg  twice daily    Historical Provider, MD  dexamethasone (DECADRON) 4 MG tablet Take 1 tablet (4 mg total) by mouth 2 (two) times daily with a meal. 06/05/15   [name], PA-C  erythromycin ophthalmic ointment Place a 1/2 inch ribbon of ointment into the lower eyelid. 2 right eye 3 times a day. 01/24/16   Fredia Sorrow, MD  esomeprazole (NEXIUM) 40 MG capsule Take 40 mg by mouth 2 (two) times daily.     Historical Provider, MD  HYDROcodone-acetaminophen (NORCO/VICODIN) 5-325 MG tablet Take 1 tablet by mouth every 4 (four) hours as needed. 06/05/15   [name], PA-C  loratadine (CLARITIN) 10 MG tablet Take 1 tablet (10 mg total) by mouth daily. 11/04/15 11/11/15  [name], MD  Menthol, Topical Analgesic, (BENGAY EX) Apply 1 application topically as needed (pain).    Historical Provider, MD  methocarbamol (ROBAXIN) 500 MG tablet Take 1 tablet (500 mg total) by mouth 3 (three) times daily. 06/05/15   [name], PA-C  predniSONE (DELTASONE) 5 MG tablet Take 5 mg by mouth every morning.     Historical Provider, MD  promethazine (PHENERGAN) 25 MG tablet Take 25 mg by mouth every 6 (six) hours as needed for nausea or vomiting.  Historical Provider, MD  tamsulosin (FLOMAX) 0.4 MG CAPS capsule Take 0.4 mg by mouth daily.     Historical Provider, MD  traMADol (ULTRAM) 50 MG tablet Take 1 tablet (50 mg total) by mouth every 6 (six) hours as needed. 01/16/16   Orpah Greek, MD  vitamin B-12 (CYANOCOBALAMIN) 1000 MCG tablet Take 1,000 mcg by mouth daily.    Historical Provider, MD    Family History Family History  Problem Relation Age of Onset  . Hypertension Sister     Social History Social History  Substance Use Topics  . Smoking status: Former Smoker    Packs/day: 0.00    Years: 5.00    Quit date: 06/14/1972  . Smokeless tobacco: Never Used  . Alcohol use No     Allergies   Codeine; Enalapril maleate; Nsaids; Tape; and Vasotec   Review of Systems Review of Systems    Constitutional: Negative for fever.  HENT: Negative for congestion and facial swelling.   Eyes: Positive for pain. Negative for discharge and visual disturbance.  Respiratory: Negative for shortness of breath.   Cardiovascular: Negative for chest pain.  Gastrointestinal: Negative for abdominal pain.  Neurological: Negative for headaches.  Hematological: Does not bruise/bleed easily.  Psychiatric/Behavioral: Negative for confusion.     Physical Exam Updated Vital Signs BP 114/80 (BP Location: Left Arm)   Pulse 84   Temp 98.3 F (36.8 C) (Oral)   Resp 16   Ht 5\' 11"  (1.803 m)   Wt 81.6 kg   SpO2 97%   BMI 25.10 kg/m   Physical Exam  Constitutional: He is oriented to person, place, and time. He appears well-developed and well-nourished. No distress.  HENT:  Head: Normocephalic and atraumatic.  Mouth/Throat: Oropharynx is clear and moist.  Eyes: Conjunctivae and EOM are normal. Pupils are equal, round, and reactive to light.  Swelling to the medial aspect of the right upper eyelid. With the induration consistent with a sty. Lower lid normal. No purulent discharge.  Neck: Normal range of motion. Neck supple.  Cardiovascular: Normal rate and regular rhythm.   Pulmonary/Chest: Effort normal and breath sounds normal.  Abdominal: Soft. Bowel sounds are normal.  Lymphadenopathy:    He has no cervical adenopathy.  Neurological: He is alert and oriented to person, place, and time. No cranial nerve deficit.  Nursing note and vitals reviewed.    ED Treatments / Results  Labs (all labs ordered are listed, but only abnormal results are displayed) Labs Reviewed - No data to display  EKG  EKG Interpretation None       Radiology No results found.  Procedures Procedures (including critical care time)  Medications Ordered in ED Medications - No data to display   Initial Impression / Assessment and Plan / ED Course  I have reviewed the triage vital signs and the nursing  notes.  Pertinent labs & imaging results that were available during my care of the patient were reviewed by me and considered in my medical decision making (see chart for details).  Clinical Course   Patient with a sty to the right upper eyelid. Present for a week. Will treat with erythromycin ointment and warm compresses. Patient is not a contact wearer. Not allergic to erythromycin. No visual changes.   Final Clinical Impressions(s) / ED Diagnoses   Final diagnoses:  Stye external, right    New Prescriptions New Prescriptions   ERYTHROMYCIN OPHTHALMIC OINTMENT    Place a 1/2 inch ribbon of ointment into the lower  eyelid. 2 right eye 3 times a day.     Fredia Sorrow, MD 01/24/16 1408

## 2016-02-13 DIAGNOSIS — I10 Essential (primary) hypertension: Secondary | ICD-10-CM | POA: Diagnosis not present

## 2016-03-14 DIAGNOSIS — I10 Essential (primary) hypertension: Secondary | ICD-10-CM | POA: Diagnosis not present

## 2016-03-15 DIAGNOSIS — H0011 Chalazion right upper eyelid: Secondary | ICD-10-CM | POA: Diagnosis not present

## 2016-03-19 ENCOUNTER — Observation Stay (HOSPITAL_COMMUNITY)
Admission: EM | Admit: 2016-03-19 | Discharge: 2016-03-20 | Discharge status: Against Medical Advice | Payer: Medicare Other | Attending: Emergency Medicine | Admitting: Emergency Medicine

## 2016-03-19 ENCOUNTER — Emergency Department (HOSPITAL_COMMUNITY): Payer: Medicare Other

## 2016-03-19 ENCOUNTER — Encounter (HOSPITAL_COMMUNITY): Payer: Self-pay

## 2016-03-19 DIAGNOSIS — Z7952 Long term (current) use of systemic steroids: Secondary | ICD-10-CM | POA: Diagnosis not present

## 2016-03-19 DIAGNOSIS — R072 Precordial pain: Principal | ICD-10-CM | POA: Insufficient documentation

## 2016-03-19 DIAGNOSIS — R0602 Shortness of breath: Secondary | ICD-10-CM | POA: Diagnosis not present

## 2016-03-19 DIAGNOSIS — R509 Fever, unspecified: Secondary | ICD-10-CM | POA: Diagnosis not present

## 2016-03-19 DIAGNOSIS — E119 Type 2 diabetes mellitus without complications: Secondary | ICD-10-CM | POA: Insufficient documentation

## 2016-03-19 DIAGNOSIS — Z940 Kidney transplant status: Secondary | ICD-10-CM | POA: Diagnosis not present

## 2016-03-19 DIAGNOSIS — R079 Chest pain, unspecified: Secondary | ICD-10-CM | POA: Diagnosis present

## 2016-03-19 DIAGNOSIS — R61 Generalized hyperhidrosis: Secondary | ICD-10-CM | POA: Diagnosis not present

## 2016-03-19 DIAGNOSIS — Z87891 Personal history of nicotine dependence: Secondary | ICD-10-CM | POA: Diagnosis not present

## 2016-03-19 DIAGNOSIS — Z79899 Other long term (current) drug therapy: Secondary | ICD-10-CM | POA: Diagnosis not present

## 2016-03-19 LAB — I-STAT CHEM 8, ED
BUN: 23 mg/dL — ABNORMAL HIGH (ref 6–20)
CHLORIDE: 104 mmol/L (ref 101–111)
Calcium, Ion: 1.18 mmol/L (ref 1.15–1.40)
Creatinine, Ser: 1.5 mg/dL — ABNORMAL HIGH (ref 0.61–1.24)
Glucose, Bld: 102 mg/dL — ABNORMAL HIGH (ref 65–99)
HEMATOCRIT: 43 % (ref 39.0–52.0)
HEMOGLOBIN: 14.6 g/dL (ref 13.0–17.0)
POTASSIUM: 4 mmol/L (ref 3.5–5.1)
SODIUM: 139 mmol/L (ref 135–145)
TCO2: 23 mmol/L (ref 0–100)

## 2016-03-19 LAB — COMPREHENSIVE METABOLIC PANEL
ALBUMIN: 3.8 g/dL (ref 3.5–5.0)
ALK PHOS: 56 U/L (ref 38–126)
ALT: 23 U/L (ref 17–63)
ANION GAP: 7 (ref 5–15)
AST: 29 U/L (ref 15–41)
BUN: 24 mg/dL — ABNORMAL HIGH (ref 6–20)
CALCIUM: 9.4 mg/dL (ref 8.9–10.3)
CHLORIDE: 104 mmol/L (ref 101–111)
CO2: 23 mmol/L (ref 22–32)
Creatinine, Ser: 1.42 mg/dL — ABNORMAL HIGH (ref 0.61–1.24)
GFR calc Af Amer: >60 mL/min (ref >60)
GFR calc non Af Amer: 52 mL/min — ABNORMAL LOW (ref >60)
GLUCOSE: 108 mg/dL — AB (ref 65–99)
POTASSIUM: 3.9 mmol/L (ref 3.5–5.1)
SODIUM: 134 mmol/L — AB (ref 135–145)
Total Bilirubin: 0.4 mg/dL (ref 0.3–1.2)
Total Protein: 7.5 g/dL (ref 6.5–8.1)

## 2016-03-19 LAB — CBC WITH DIFFERENTIAL/PLATELET
BASOS PCT: 1 %
Basophils Absolute: 0 10*3/uL (ref 0.0–0.1)
EOS ABS: 0.2 10*3/uL (ref 0.0–0.7)
Eosinophils Relative: 3 %
HCT: 40.3 % (ref 39.0–52.0)
HEMOGLOBIN: 13.3 g/dL (ref 13.0–17.0)
Lymphocytes Relative: 31 %
Lymphs Abs: 1.9 10*3/uL (ref 0.7–4.0)
MCH: 22.6 pg — ABNORMAL LOW (ref 26.0–34.0)
MCHC: 33 g/dL (ref 30.0–36.0)
MCV: 68.5 fL — ABNORMAL LOW (ref 78.0–100.0)
MONOS PCT: 10 %
Monocytes Absolute: 0.6 10*3/uL (ref 0.1–1.0)
NEUTROS PCT: 56 %
Neutro Abs: 3.5 10*3/uL (ref 1.7–7.7)
PLATELETS: 150 10*3/uL (ref 150–400)
RBC: 5.88 MIL/uL — AB (ref 4.22–5.81)
RDW: 15.2 % (ref 11.5–15.5)
WBC: 6.3 10*3/uL (ref 4.0–10.5)

## 2016-03-19 LAB — D-DIMER, QUANTITATIVE: D-Dimer, Quant: 2.53 ug/mL-FEU — ABNORMAL HIGH (ref 0.00–0.50)

## 2016-03-19 LAB — LIPASE, BLOOD: LIPASE: 32 U/L (ref 11–51)

## 2016-03-19 LAB — TROPONIN I: Troponin I: <0.03 ng/mL (ref <0.03)

## 2016-03-19 LAB — I-STAT TROPONIN, ED: TROPONIN I, POC: 0 ng/mL (ref 0.00–0.08)

## 2016-03-19 MED ORDER — ASPIRIN 81 MG PO CHEW
324.0000 mg | CHEWABLE_TABLET | Freq: Once | ORAL | Status: AC
  Administered 2016-03-19: 324 mg via ORAL
  Filled 2016-03-19: qty 4

## 2016-03-19 NOTE — ED Notes (Signed)
EDP at bedside at this time, EKG completed

## 2016-03-19 NOTE — ED Provider Notes (Signed)
[facility] DEPT Provider Note   CSN: [id number] Arrival date & time: 03/19/16  2040  By signing my name below, I, Jeanell Sparrow, attest that this documentation has been prepared under the direction and in the presence of Ezequiel Essex, MD . Electronically Signed: Jeanell Sparrow, Scribe. 03/19/2016. 8:49 PM.  History   Chief Complaint Chief Complaint  Patient presents with  . Shortness of Breath   The history is provided by the patient. No language interpreter was used.   HPI Comments: Robert Durham is a 60 y.o. male who presents to the Emergency Department complaining of intermittent moderate substernal chest pain that started 2 days ago. He states he had an onset of pain yesterday that went away on its own and returned today. He describes the pain as non radiating, sharp, and having episodes lasting several hours at a time. He states his pain is exacerbated by movement and relieved by nothing. He had no medication PTA. He reports associated symptoms of subjective fever, diaphoresis, SOB, nausea, and dry cough. Pt's temperature in the ED today was 97.7. He states he has a hx of asthma, DM, HTN, depression, and a surgical hx of kidney transplant in 1994. He reports an allergy to aspirin. He denies any prior hx of similar complaint, hx of cardiac problems, hx of acid reflux, heavy alcohol use, hx of smoking, back pain, or abdominal pain.   Past Medical History:  Diagnosis Date  . Chronic back pain   . Chronic hip pain   . Depression   . Diabetes mellitus   . Diverticulitis   . H/O kidney transplant   . Hypertension   . Kidney stone   . Renal disorder   . Urinary retention     There are no active problems to display for this patient.   Past Surgical History:  Procedure Laterality Date  . COLONOSCOPY  05/27/2003   [id number] terminal ileum,rectum, and colon  . HERNIA REPAIR    . HIP SURGERY    . KIDNEY TRANSPLANT    . NEPHRECTOMY TRANSPLANTED ORGAN         Home  Medications    Prior to Admission medications   Medication Sig Start Date End Date Taking? Authorizing Provider  atorvastatin (LIPITOR) 10 MG tablet Take 10 mg by mouth every morning.    Yes Historical Provider, MD  cycloSPORINE (SANDIMMUNE) 100 MG capsule Take 100 mg by mouth 2 (two) times daily. Take with 25 mg tablet to equal 125 mg twice daily   Yes Historical Provider, MD  cycloSPORINE (SANDIMMUNE) 25 MG capsule Take 25 mg by mouth 2 (two) times daily. Take with 100 mg tablet to equal dose of 125 mg twice daily   Yes Historical Provider, MD  esomeprazole (NEXIUM) 40 MG capsule Take 40 mg by mouth 2 (two) times daily.    Yes Historical Provider, MD  predniSONE (DELTASONE) 5 MG tablet Take 5 mg by mouth every morning.    Yes Historical Provider, MD  promethazine (PHENERGAN) 25 MG tablet Take 25 mg by mouth every 6 (six) hours as needed for nausea or vomiting.   Yes Historical Provider, MD  tamsulosin (FLOMAX) 0.4 MG CAPS capsule Take 0.4 mg by mouth daily.    Yes Historical Provider, MD  vitamin B-12 (CYANOCOBALAMIN) 1000 MCG tablet Take 1,000 mcg by mouth daily.   Yes Historical Provider, MD    Family History Family History  Problem Relation Age of Onset  . Hypertension Sister     Social History Social  History  Substance Use Topics  . Smoking status: Former Smoker    Packs/day: 0.00    Years: 5.00    Quit date: 06/14/1972  . Smokeless tobacco: Never Used  . Alcohol use No     Allergies   Codeine; Enalapril maleate; Nsaids; Tape; and Vasotec   Review of Systems Review of Systems A complete 10 system review of systems was obtained and all systems are negative except as noted in the HPI and PMH.   Physical Exam Updated Vital Signs BP 115/80 (BP Location: Left Arm)   Pulse (!) 57   Temp 97.7 F (36.5 C) (Oral)   Resp 24   Ht 5\' 11"  (1.803 m)   Wt 180 lb (81.6 kg)   SpO2 100%   BMI 25.10 kg/m   Physical Exam  Constitutional: He is oriented to person, place, and  time. He appears well-developed and well-nourished. No distress.  Uncomfortable, diaphoretic, and shaking.   HENT:  Head: Normocephalic and atraumatic.  Mouth/Throat: Oropharynx is clear and moist. No oropharyngeal exudate.  Eyes: Conjunctivae and EOM are normal. Pupils are equal, round, and reactive to light.  Neck: Normal range of motion. Neck supple.  No meningismus.  Cardiovascular: Normal rate, regular rhythm, normal heart sounds and intact distal pulses.   No murmur heard. Pulmonary/Chest: Effort normal and breath sounds normal. No respiratory distress. He has no wheezes. He has no rales. He exhibits tenderness.  Abdominal: Soft. There is no tenderness. There is no rebound and no guarding.  Musculoskeletal: Normal range of motion. He exhibits no edema or tenderness.  Central chest TTP. Equal femoral and radial pulses.   Neurological: He is alert and oriented to person, place, and time. No cranial nerve deficit. He exhibits normal muscle tone. Coordination normal.   5/5 strength throughout. CN 2-12 intact. Equal grip strength.   Skin: Skin is warm. He is diaphoretic.  Psychiatric: He has a normal mood and affect. His behavior is normal.  Nursing note and vitals reviewed.    ED Treatments / Results  DIAGNOSTIC STUDIES: Oxygen Saturation is 100% on RA, normal by my interpretation.    COORDINATION OF CARE: 8:55 PM- Pt advised of plan for treatment and pt agrees.  Labs (all labs ordered are listed, but only abnormal results are displayed) Labs Reviewed  CBC WITH DIFFERENTIAL/PLATELET - Abnormal; Notable for the following:       Result Value   RBC 5.88 (*)    MCV 68.5 (*)    MCH 22.6 (*)    All other components within normal limits  COMPREHENSIVE METABOLIC PANEL - Abnormal; Notable for the following:    Sodium 134 (*)    Glucose, Bld 108 (*)    BUN 24 (*)    Creatinine, Ser 1.42 (*)    GFR calc non Af Amer 52 (*)    All other components within normal limits  D-DIMER,  QUANTITATIVE (NOT AT [facility]) - Abnormal; Notable for the following:    D-Dimer, Quant 2.53 (*)    All other components within normal limits  I-STAT CHEM 8, ED - Abnormal; Notable for the following:    BUN 23 (*)    Creatinine, Ser 1.50 (*)    Glucose, Bld 102 (*)    All other components within normal limits  TROPONIN I  LIPASE, BLOOD  I-STAT TROPOININ, ED    EKG  EKG Interpretation  Date/Time:  Friday March 19 2016 20:47:41 EDT Ventricular Rate:  55 PR Interval:    QRS Duration:  93 QT Interval:  401 QTC Calculation: 384 R Axis:   63 Text Interpretation:  Sinus rhythm Abnormal R-wave progression, early transition No significant change was found Confirmed by Wyvonnia Dusky  MD, Audrey Eller [id number]) on 03/19/2016 9:08:13 PM       Radiology Dg Chest 2 View  Result Date: 03/19/2016 CLINICAL DATA:  Central chest pain EXAM: CHEST  2 VIEW COMPARISON:  Chest radiograph 11/04/2015 FINDINGS: Cardiomediastinal contours are normal. No pneumothorax or pleural effusion. Unchanged appearance of diffusely increased pulmonary markings. No focal airspace consolidation or pulmonary edema. IMPRESSION: No active cardiopulmonary disease. Electronically Signed   By: Ulyses Jarred M.D.   On: 03/19/2016 22:03   Ct Angio Chest/abd/pel For Dissection W And/or Wo Contrast  Result Date: 03/19/2016 CLINICAL DATA:  Sharp intermittent chest pain, chest pressure, and shortness of breath for 4 days. Elevated D-dimer. History of kidney transplant. EXAM: CT ANGIOGRAPHY CHEST, ABDOMEN AND PELVIS TECHNIQUE: Multidetector CT imaging through the chest, abdomen and pelvis was performed using the standard protocol during bolus administration of intravenous contrast. Multiplanar reconstructed images and MIPs were obtained and reviewed to evaluate the vascular anatomy. CONTRAST:  80 mL Isovue 370 COMPARISON:  CT abdomen and pelvis 03/08/2015 FINDINGS: CTA CHEST FINDINGS Cardiovascular: Noncontrast images of the chest demonstrate scattered  aortic calcifications. Dilated ascending thoracic aorta at 4 cm AP diameter. No evidence of intramural hematoma. Images obtained during arterial phase after bolus dynamic administration of contrast material demonstrate patent thoracic aorta and great vessels. No evidence of aortic dissection. Central pulmonary arteries are well opacified without evidence of significant central pulmonary embolus. Normal heart size. Mediastinum/Nodes: No enlarged mediastinal, hilar, or axillary lymph nodes. Thyroid gland, trachea, and esophagus demonstrate no significant findings. Lungs/Pleura: Mild dependent atelectasis in the lung bases. No focal airspace disease or consolidation. No pleural effusions. No pneumothorax. Airways appear patent. Musculoskeletal: No chest wall abnormality. No acute or significant osseous findings. Review of the MIP images confirms the above findings. CTA ABDOMEN AND PELVIS FINDINGS VASCULAR Aorta: Normal caliber abdominal aorta with scattered calcifications. Aorta is widely patent. No evidence of dissection. Celiac: Patent without evidence of aneurysm, dissection, vasculitis or significant stenosis. SMA: Patent without evidence of aneurysm, dissection, vasculitis or significant stenosis. Renals: Renal arteries are diminutive bilaterally but are patent. IMA: Patent without evidence of aneurysm, dissection, vasculitis or significant stenosis. Inflow: Patent without evidence of aneurysm, dissection, vasculitis or significant stenosis. Veins: No obvious venous abnormality within the limitations of this arterial phase study. Review of the MIP images confirms the above findings. NON-VASCULAR Hepatobiliary: Sub cm low-attenuation lesions demonstrated in the liver are too small to characterize but probably represent cysts. Gallbladder and bile ducts are unremarkable. Pancreas: Unremarkable. No pancreatic ductal dilatation or surrounding inflammatory changes. Spleen: Normal in size without focal abnormality.  Adrenals/Urinary Tract: No adrenal gland nodules. Native kidneys are severely atrophic bilaterally although some nephrogram is demonstrated. Left pelvic transplant kidney demonstrates normal nephrogram. No hydronephrosis. Bladder wall is not thickened. Stomach/Bowel: Stomach, small bowel, and colon are not abnormally distended. Diverticula in the colon without evidence of diverticulitis. Appendix is normal. Lymphatic:  No significant lymphadenopathy. Reproductive: Prostate gland is mildly enlarged at 4 cm diameter. Other: Small right inguinal hernia containing fat. No free air or free fluid in the abdomen. Musculoskeletal: Postoperative change with left hip arthroplasty. Degenerative changes in the spine. No destructive bone lesions. Review of the MIP images confirms the above findings. IMPRESSION: No evidence of aneurysm or dissection involving the thoracic or abdominal aorta. Major branch vessels are  patent. No evidence of significant pulmonary embolus. Lungs are clear. No acute process demonstrated in the abdomen or pelvis. Bilateral atrophic kidneys with left pelvic renal transplant kidney. Electronically Signed   By: Lucienne Capers M.D.   On: 03/19/2016 23:19    Procedures Procedures (including critical care time)  Medications Ordered in ED Medications  aspirin chewable tablet 324 mg (324 mg Oral Given 03/19/16 2147)     Initial Impression / Assessment and Plan / ED Course  I have reviewed the triage vital signs and the nursing notes.  Pertinent labs & imaging results that were available during my care of the patient were reviewed by me and considered in my medical decision making (see chart for details).  Clinical Course   Intermittent central chest pain for the past 2 days that comes and goes. Somewhat reproducible. EKG shows no acute ST changes. Patient however is diaphoretic and uncomfortable.  Patient is diaphoretic and has shaking chills. He is afebrile. Radial pulses are diminished  and thready. Will evaluate for aortic dissection,  Pain resolved after ASA. Troponin negative.  D-dimer positive.  Creatinine is acceptable for IV contrast.  CT negative for aortic dissection or pulmonary embolism.  Heart score 4.  Chest pain has resolved.  Admission arranged for chest pain rule out. D/w Dr. Loleta Books.   Patient now states he needs to go home and lock his front door. There is no one else to lock the door for him. He states he needs to go home and lock the door. Discussed with patient if he leaves it will be against medical advice. He may at risk for having a heart attack which may cause death. Offered to have police lock the door for him which patient declines.  Patient appears to have capacity to make medical decisions and will leave [place]. He refuses to have second troponin drawn.  Patient requesting to leave against medical advice. He is alert and oriented x3 and appears to have decision making capacity. Denies suicidal or homicidal ideation.  Discussed that admission and further testing is recommended and emergent medical conditions have not been ruled out. Discussed that leaving against medical advice may result in clinical deterioration and possible death.   Final Clinical Impressions(s) / ED Diagnoses   Final diagnoses:  Chest pain, unspecified type    New Prescriptions New Prescriptions   No medications on file   I personally performed the services described in this documentation, which was scribed in my presence. The recorded information has been reviewed and is accurate.    Ezequiel Essex, MD 03/20/16 [phone]

## 2016-03-19 NOTE — ED Triage Notes (Signed)
Shortness of breath and chest discomfort X2 days.

## 2016-03-20 DIAGNOSIS — R079 Chest pain, unspecified: Secondary | ICD-10-CM | POA: Diagnosis present

## 2016-03-20 NOTE — ED Notes (Signed)
After being advised to stay for admissions by EDP Rancour, patient has decided to leave AMA.

## 2016-03-29 DIAGNOSIS — Z23 Encounter for immunization: Secondary | ICD-10-CM | POA: Diagnosis not present

## 2016-03-29 DIAGNOSIS — R05 Cough: Secondary | ICD-10-CM | POA: Diagnosis not present

## 2016-03-29 DIAGNOSIS — Z125 Encounter for screening for malignant neoplasm of prostate: Secondary | ICD-10-CM | POA: Diagnosis not present

## 2016-03-29 DIAGNOSIS — N400 Enlarged prostate without lower urinary tract symptoms: Secondary | ICD-10-CM | POA: Diagnosis not present

## 2016-04-14 DIAGNOSIS — I10 Essential (primary) hypertension: Secondary | ICD-10-CM | POA: Diagnosis not present

## 2016-05-14 DIAGNOSIS — I10 Essential (primary) hypertension: Secondary | ICD-10-CM | POA: Diagnosis not present

## 2016-05-26 ENCOUNTER — Emergency Department (HOSPITAL_COMMUNITY)
Admission: EM | Admit: 2016-05-26 | Discharge: 2016-05-26 | Disposition: A | Discharge status: Home / Self Care | Payer: Medicare Other

## 2016-05-27 ENCOUNTER — Emergency Department (HOSPITAL_COMMUNITY)
Admission: EM | Admit: 2016-05-27 | Discharge: 2016-05-27 | Disposition: A | Discharge status: Home / Self Care | Payer: Medicare Other | Attending: Emergency Medicine | Admitting: Emergency Medicine

## 2016-05-27 ENCOUNTER — Encounter (HOSPITAL_COMMUNITY): Payer: Self-pay

## 2016-05-27 DIAGNOSIS — R109 Unspecified abdominal pain: Secondary | ICD-10-CM | POA: Diagnosis not present

## 2016-05-27 DIAGNOSIS — I10 Essential (primary) hypertension: Secondary | ICD-10-CM | POA: Diagnosis not present

## 2016-05-27 DIAGNOSIS — Z87891 Personal history of nicotine dependence: Secondary | ICD-10-CM | POA: Diagnosis not present

## 2016-05-27 DIAGNOSIS — Z79899 Other long term (current) drug therapy: Secondary | ICD-10-CM | POA: Diagnosis not present

## 2016-05-27 DIAGNOSIS — M545 Low back pain, unspecified: Secondary | ICD-10-CM

## 2016-05-27 DIAGNOSIS — Z940 Kidney transplant status: Secondary | ICD-10-CM | POA: Insufficient documentation

## 2016-05-27 DIAGNOSIS — E119 Type 2 diabetes mellitus without complications: Secondary | ICD-10-CM | POA: Diagnosis not present

## 2016-05-27 LAB — URINALYSIS, ROUTINE W REFLEX MICROSCOPIC
Bilirubin Urine: NEGATIVE
GLUCOSE, UA: NEGATIVE mg/dL
HGB URINE DIPSTICK: NEGATIVE
KETONES UR: NEGATIVE mg/dL
Leukocytes, UA: NEGATIVE
Nitrite: NEGATIVE
PH: 6 (ref 5.0–8.0)
PROTEIN: NEGATIVE mg/dL
Specific Gravity, Urine: 1.02 (ref 1.005–1.030)

## 2016-05-27 LAB — CBC WITH DIFFERENTIAL/PLATELET
BASOS ABS: 0.1 10*3/uL (ref 0.0–0.1)
BASOS PCT: 1 %
Eosinophils Absolute: 0.2 10*3/uL (ref 0.0–0.7)
Eosinophils Relative: 3 %
HCT: 40.8 % (ref 39.0–52.0)
Hemoglobin: 13.2 g/dL (ref 13.0–17.0)
LYMPHS ABS: 2.5 10*3/uL (ref 0.7–4.0)
Lymphocytes Relative: 39 %
MCH: 22.8 pg — ABNORMAL LOW (ref 26.0–34.0)
MCHC: 32.4 g/dL (ref 30.0–36.0)
MCV: 70.3 fL — AB (ref 78.0–100.0)
MONOS PCT: 11 %
Monocytes Absolute: 0.7 10*3/uL (ref 0.1–1.0)
NEUTROS ABS: 2.9 10*3/uL (ref 1.7–7.7)
Neutrophils Relative %: 46 %
PLATELETS: 144 10*3/uL — AB (ref 150–400)
RBC: 5.8 MIL/uL (ref 4.22–5.81)
RDW: 15.9 % — AB (ref 11.5–15.5)
WBC: 6.4 10*3/uL (ref 4.0–10.5)

## 2016-05-27 LAB — I-STAT CHEM 8, ED
BUN: 21 mg/dL — ABNORMAL HIGH (ref 6–20)
CALCIUM ION: 1.26 mmol/L (ref 1.15–1.40)
CREATININE: 1.3 mg/dL — AB (ref 0.61–1.24)
Chloride: 102 mmol/L (ref 101–111)
GLUCOSE: 79 mg/dL (ref 65–99)
HCT: 42 % (ref 39.0–52.0)
HEMOGLOBIN: 14.3 g/dL (ref 13.0–17.0)
POTASSIUM: 3.8 mmol/L (ref 3.5–5.1)
Sodium: 141 mmol/L (ref 135–145)
TCO2: 26 mmol/L (ref 0–100)

## 2016-05-27 LAB — BASIC METABOLIC PANEL
ANION GAP: 5 (ref 5–15)
BUN: 21 mg/dL — AB (ref 6–20)
CALCIUM: 9.1 mg/dL (ref 8.9–10.3)
CO2: 26 mmol/L (ref 22–32)
CREATININE: 1.28 mg/dL — AB (ref 0.61–1.24)
Chloride: 105 mmol/L (ref 101–111)
GFR calc Af Amer: >60 mL/min (ref >60)
GFR, EST NON AFRICAN AMERICAN: 59 mL/min — AB (ref >60)
GLUCOSE: 83 mg/dL (ref 65–99)
Potassium: 3.7 mmol/L (ref 3.5–5.1)
Sodium: 136 mmol/L (ref 135–145)

## 2016-05-27 MED ORDER — OXYCODONE HCL 5 MG PO TABS
5.0000 mg | ORAL_TABLET | Freq: Once | ORAL | Status: AC
  Administered 2016-05-27: 5 mg via ORAL
  Filled 2016-05-27: qty 1

## 2016-05-27 MED ORDER — ACETAMINOPHEN 325 MG PO TABS: 650.0000 mg | ORAL_TABLET | Freq: Four times a day (QID) | ORAL | 0 refills | Status: DC | PRN

## 2016-05-27 MED ORDER — METHOCARBAMOL 500 MG PO TABS
500.0000 mg | ORAL_TABLET | Freq: Once | ORAL | Status: AC
  Administered 2016-05-27: 500 mg via ORAL
  Filled 2016-05-27: qty 1

## 2016-05-27 MED ORDER — METHOCARBAMOL 500 MG PO TABS: 500.0000 mg | ORAL_TABLET | Freq: Two times a day (BID) | ORAL | 0 refills | Status: DC

## 2016-05-27 NOTE — ED Triage Notes (Signed)
Pt c/o pain in r flank.  Denies any injury, denies urinary symptoms.  Reports history of kidney stones.

## 2016-05-27 NOTE — ED Provider Notes (Signed)
[facility] DEPT Provider Note   CSN: [id number] Arrival date & time: 05/27/16  [id number]  By signing my name below, I, Robert Durham, attest that this documentation has been prepared under the direction and in the presence of [name], MD. Electronically Signed: [name], ED Scribe. 05/27/16. 10:07 AM.   History   Chief Complaint Chief Complaint  Patient presents with  . Flank Pain    HPI HPI Comments: Nam Robert Durham is a 60 y.o. male with a h/o kidney transplant and kidney stones who presents to the Emergency Department complaining of constant, moderate, right flank pain x 3 days. His pain worsens with movement and radiates across his lower back. Pt is unsure if his current pain is consistent with prior kidney stones. He has taken tylenol w/o significant relief. Pt confirms his h/o kidney transplant which was performed in 1994. Pt denies any ETOH consumption, h/o IVDA or smoking. He denies hematuria, dysuria, malodorous urine, fevers, nausea, vomiting, bowel or bladder incontinence, chills, diarrhea, constipation, rash and falls/recent trauma.   The history is provided by the patient and medical records. No language interpreter was used.    Past Medical History:  Diagnosis Date  . Chronic back pain   . Chronic hip pain   . Depression   . Diabetes mellitus   . Diverticulitis   . H/O kidney transplant   . Hypertension   . Kidney stone   . Renal disorder   . Urinary retention     Patient Active Problem List   Diagnosis Date Noted  . Chest pain 03/20/2016    Past Surgical History:  Procedure Laterality Date  . COLONOSCOPY  05/27/2003   [id number] terminal ileum,rectum, and colon  . HERNIA REPAIR    . HIP SURGERY    . KIDNEY TRANSPLANT    . NEPHRECTOMY TRANSPLANTED ORGAN         Home Medications    Prior to Admission medications   Medication Sig Start Date End Date Taking? Authorizing Provider  atorvastatin (LIPITOR) 10 MG tablet Take 10 mg by mouth  every morning.    Yes Historical Provider, MD  cycloSPORINE (SANDIMMUNE) 100 MG capsule Take 100 mg by mouth 2 (two) times daily. Take with 25 mg tablet to equal 125 mg twice daily   Yes Historical Provider, MD  cycloSPORINE (SANDIMMUNE) 25 MG capsule Take 25 mg by mouth 2 (two) times daily. Take with 100 mg tablet to equal dose of 125 mg twice daily   Yes Historical Provider, MD  esomeprazole (NEXIUM) 40 MG capsule Take 40 mg by mouth 2 (two) times daily.    Yes Historical Provider, MD  predniSONE (DELTASONE) 5 MG tablet Take 5 mg by mouth every morning.    Yes Historical Provider, MD  tamsulosin (FLOMAX) 0.4 MG CAPS capsule Take 0.4 mg by mouth daily.    Yes Historical Provider, MD  vitamin B-12 (CYANOCOBALAMIN) 1000 MCG tablet Take 1,000 mcg by mouth daily.   Yes Historical Provider, MD  acetaminophen (TYLENOL) 325 MG tablet Take 2 tablets (650 mg total) by mouth every 6 (six) hours as needed. 05/27/16   [name], MD  methocarbamol (ROBAXIN) 500 MG tablet Take 1 tablet (500 mg total) by mouth 2 (two) times daily. 05/27/16   [name], MD    Family History Family History  Problem Relation Age of Onset  . Hypertension Sister     Social History Social History  Substance Use Topics  . Smoking status: Former Smoker    Packs/day:  0.00    Years: 5.00    Quit date: 06/14/1972  . Smokeless tobacco: Never Used  . Alcohol use No     Allergies   Codeine; Enalapril maleate; Nsaids; Tape; and Vasotec   Review of Systems Review of Systems  Constitutional: Negative for chills and fever.  Gastrointestinal: Negative for constipation, diarrhea, nausea and vomiting.  Genitourinary: Positive for flank pain. Negative for difficulty urinating, dysuria and hematuria.  Musculoskeletal: Positive for back pain and myalgias.  Skin: Negative for rash.  All other systems reviewed and are negative.  Physical Exam Updated Vital Signs BP 120/90 (BP Location: Left Arm)   Pulse 60   Temp 97.9 F  (36.6 C) (Oral)   Resp 18   Ht 5\' 11"  (1.803 m)   Wt 180 lb (81.6 kg)   SpO2 100%   BMI 25.10 kg/m   Physical Exam  Constitutional: He is oriented to person, place, and time. He appears well-developed and well-nourished.  HENT:  Head: Normocephalic and atraumatic.  Eyes: EOM are normal.  Neck: Normal range of motion.  Cardiovascular: Normal rate, regular rhythm, normal heart sounds and intact distal pulses.  Exam reveals no gallop and no friction rub.   No murmur heard. Pulmonary/Chest: Effort normal and breath sounds normal. No respiratory distress. He has no wheezes. He has no rales.  Abdominal: Soft. Bowel sounds are normal. He exhibits no distension. There is no tenderness.  Musculoskeletal: Normal range of motion. He exhibits tenderness.       Lumbar back: He exhibits tenderness and spasm. He exhibits no bony tenderness.  Tenderness and muscle spasm noted in the right lower back. No midline spinous tenderness.   Neurological: He is alert and oriented to person, place, and time. He has normal strength. No sensory deficit.  Motor and sensory intact in the BLEs  Skin: Skin is warm and dry. No rash noted.  No rashes noted  Psychiatric: He has a normal mood and affect. Judgment normal.  Nursing note and vitals reviewed.  ED Treatments / Results  Labs (all labs ordered are listed, but only abnormal results are displayed) Labs Reviewed  CBC WITH DIFFERENTIAL/PLATELET - Abnormal; Notable for the following:       Result Value   MCV 70.3 (*)    MCH 22.8 (*)    RDW 15.9 (*)    Platelets 144 (*)    All other components within normal limits  BASIC METABOLIC PANEL - Abnormal; Notable for the following:    BUN 21 (*)    Creatinine, Ser 1.28 (*)    GFR calc non Af Amer 59 (*)    All other components within normal limits  I-STAT CHEM 8, ED - Abnormal; Notable for the following:    BUN 21 (*)    Creatinine, Ser 1.30 (*)    All other components within normal limits  URINALYSIS,  ROUTINE W REFLEX MICROSCOPIC    EKG  EKG Interpretation None       Radiology No results found.  Procedures Procedures  DIAGNOSTIC STUDIES: Oxygen Saturation is 100% on RA, normal by my interpretation.    COORDINATION OF CARE: 10:06 AM Discussed next steps with pt. Pt verbalized understanding and is agreeable with the plan.    Medications Ordered in ED Medications  methocarbamol (ROBAXIN) tablet 500 mg (500 mg Oral Given 05/27/16 1030)  oxyCODONE (Oxy IR/ROXICODONE) immediate release tablet 5 mg (5 mg Oral Given 05/27/16 1029)     Initial Impression / Assessment and Plan / ED  Course  I have reviewed the triage vital signs and the nursing notes.  Pertinent labs & imaging results that were available during my care of the patient were reviewed by me and considered in my medical decision making (see chart for details).  Clinical Course     Likely MSK in nature. Urine fine, labs fine. No e/o infection or ARF. Symptoms improved with treatment in ED. Will dc on robaxin and tylenol with supportive care instructions.  If not improving in a week will see PCP. If worsening or new symptoms will return here.   I personally performed the services described in this documentation, which was scribed in my presence. The recorded information has been reviewed and is accurate.     Final Clinical Impressions(s) / ED Diagnoses   Final diagnoses:  Right flank pain  Acute right-sided low back pain without sciatica    New Prescriptions Discharge Medication List as of 05/27/2016  1:48 PM    START taking these medications   Details  acetaminophen (TYLENOL) 325 MG tablet Take 2 tablets (650 mg total) by mouth every 6 (six) hours as needed., Starting Thu 05/27/2016, Print    methocarbamol (ROBAXIN) 500 MG tablet Take 1 tablet (500 mg total) by mouth 2 (two) times daily., Starting Thu 05/27/2016, Print         [name], MD 05/27/16 [phone]

## 2016-06-14 DIAGNOSIS — I10 Essential (primary) hypertension: Secondary | ICD-10-CM | POA: Diagnosis not present

## 2016-06-15 DIAGNOSIS — G8929 Other chronic pain: Secondary | ICD-10-CM | POA: Diagnosis not present

## 2016-06-15 DIAGNOSIS — D509 Iron deficiency anemia, unspecified: Secondary | ICD-10-CM | POA: Diagnosis not present

## 2016-06-15 DIAGNOSIS — Z79899 Other long term (current) drug therapy: Secondary | ICD-10-CM | POA: Diagnosis not present

## 2016-06-15 DIAGNOSIS — Z940 Kidney transplant status: Secondary | ICD-10-CM | POA: Diagnosis not present

## 2016-06-15 DIAGNOSIS — M25552 Pain in left hip: Secondary | ICD-10-CM | POA: Diagnosis not present

## 2016-06-15 DIAGNOSIS — I10 Essential (primary) hypertension: Secondary | ICD-10-CM | POA: Diagnosis not present

## 2016-06-15 DIAGNOSIS — Z4822 Encounter for aftercare following kidney transplant: Secondary | ICD-10-CM | POA: Diagnosis not present

## 2016-06-15 DIAGNOSIS — M549 Dorsalgia, unspecified: Secondary | ICD-10-CM | POA: Diagnosis not present

## 2016-06-15 DIAGNOSIS — Z7952 Long term (current) use of systemic steroids: Secondary | ICD-10-CM | POA: Diagnosis not present

## 2016-07-02 DIAGNOSIS — Z79899 Other long term (current) drug therapy: Secondary | ICD-10-CM | POA: Diagnosis not present

## 2016-07-02 DIAGNOSIS — Z940 Kidney transplant status: Secondary | ICD-10-CM | POA: Diagnosis not present

## 2016-07-02 DIAGNOSIS — Z5181 Encounter for therapeutic drug level monitoring: Secondary | ICD-10-CM | POA: Diagnosis not present

## 2016-07-05 DIAGNOSIS — Z79899 Other long term (current) drug therapy: Secondary | ICD-10-CM | POA: Diagnosis not present

## 2016-07-05 DIAGNOSIS — I10 Essential (primary) hypertension: Secondary | ICD-10-CM | POA: Diagnosis not present

## 2016-07-05 DIAGNOSIS — K219 Gastro-esophageal reflux disease without esophagitis: Secondary | ICD-10-CM | POA: Diagnosis not present

## 2016-07-09 DIAGNOSIS — H40013 Open angle with borderline findings, low risk, bilateral: Secondary | ICD-10-CM | POA: Diagnosis not present

## 2016-07-15 DIAGNOSIS — I10 Essential (primary) hypertension: Secondary | ICD-10-CM | POA: Diagnosis not present

## 2016-07-25 ENCOUNTER — Encounter (HOSPITAL_COMMUNITY): Payer: Self-pay | Admitting: Emergency Medicine

## 2016-07-25 ENCOUNTER — Emergency Department (HOSPITAL_COMMUNITY)
Admission: EM | Admit: 2016-07-25 | Discharge: 2016-07-25 | Disposition: A | Discharge status: Home / Self Care | Payer: Medicare Other | Attending: Emergency Medicine | Admitting: Emergency Medicine

## 2016-07-25 DIAGNOSIS — E119 Type 2 diabetes mellitus without complications: Secondary | ICD-10-CM | POA: Insufficient documentation

## 2016-07-25 DIAGNOSIS — Z87891 Personal history of nicotine dependence: Secondary | ICD-10-CM | POA: Insufficient documentation

## 2016-07-25 DIAGNOSIS — M5136 Other intervertebral disc degeneration, lumbar region: Secondary | ICD-10-CM

## 2016-07-25 DIAGNOSIS — Z79899 Other long term (current) drug therapy: Secondary | ICD-10-CM | POA: Diagnosis not present

## 2016-07-25 DIAGNOSIS — Z96649 Presence of unspecified artificial hip joint: Secondary | ICD-10-CM | POA: Diagnosis not present

## 2016-07-25 DIAGNOSIS — M545 Low back pain: Secondary | ICD-10-CM | POA: Diagnosis present

## 2016-07-25 DIAGNOSIS — I10 Essential (primary) hypertension: Secondary | ICD-10-CM | POA: Insufficient documentation

## 2016-07-25 DIAGNOSIS — M25512 Pain in left shoulder: Secondary | ICD-10-CM | POA: Diagnosis not present

## 2016-07-25 MED ORDER — HYDROCODONE-ACETAMINOPHEN 5-325 MG PO TABS
2.0000 | ORAL_TABLET | Freq: Once | ORAL | Status: AC
  Administered 2016-07-25: 2 via ORAL
  Filled 2016-07-25: qty 2

## 2016-07-25 MED ORDER — PROMETHAZINE HCL 12.5 MG PO TABS
12.5000 mg | ORAL_TABLET | Freq: Once | ORAL | Status: AC
  Administered 2016-07-25: 12.5 mg via ORAL
  Filled 2016-07-25: qty 1

## 2016-07-25 MED ORDER — HYDROCODONE-ACETAMINOPHEN 5-325 MG PO TABS: 1.0000 | ORAL_TABLET | ORAL | 0 refills | Status: DC | PRN

## 2016-07-25 MED ORDER — ONDANSETRON HCL 4 MG PO TABS: 4.0000 mg | ORAL_TABLET | Freq: Four times a day (QID) | ORAL | 0 refills | Status: DC

## 2016-07-25 NOTE — ED Notes (Signed)
Pt verbalized understanding of no driving and to use caution within 4 hours of taking pain meds due to meds cause drowsiness 

## 2016-07-25 NOTE — Discharge Instructions (Signed)
I have reviewed your records. You have a history of degenerative disc disease involving your lumbar spine area. Your neurologic examination is within normal limits today. Please see Dr. Aline Brochure for evaluation and additional management concerning your back pain. Use Tylenol for mild pain, use Norco for more severe pain. This medication may make you drowsy or lightheaded, please use this medication with caution.

## 2016-07-25 NOTE — ED Provider Notes (Signed)
[facility] DEPT Provider Note   CSN: [id number] Arrival date & time: 07/25/16  [id number]   By signing my name below, I, Collene Leyden, attest that this documentation has been prepared under the direction and in the presence of [name] PA-C Electronically Signed: Collene Leyden, Scribe. 07/25/16. 12:01 AM.  History   Chief Complaint Chief Complaint  Patient presents with  . Back Pain   HPI Comments: Robert Durham is a 61 y.o. male with a hx of arthritis of the back, degenerative disc disease, renal transplant, and hip replacement, who presents to the Emergency Department complaining of gradually worsening, constant lower back pain that began 3 days ago. Patient states the last time he was here he received a muscle relaxer, for the same problem, in which he now has shoulder pain. Patient was last seen in the ED on  Patient has associated left shoulder pain with movement and bilateral hand swelling (yesterday, no longer present). Patient states he is not on any pain medicaiton from his primary care physician. No modifying factors indicated. Patient denies any other symptoms.   The history is provided by the patient. No language interpreter was used.    Past Medical History:  Diagnosis Date  . Chronic back pain   . Chronic hip pain   . Depression   . Diabetes mellitus   . Diverticulitis   . H/O kidney transplant   . Hypertension   . Kidney stone   . Renal disorder   . Urinary retention     Patient Active Problem List   Diagnosis Date Noted  . Chest pain 03/20/2016    Past Surgical History:  Procedure Laterality Date  . COLONOSCOPY  05/27/2003   [id number] terminal ileum,rectum, and colon  . HERNIA REPAIR    . HIP SURGERY    . KIDNEY TRANSPLANT    . NEPHRECTOMY TRANSPLANTED ORGAN         Home Medications    Prior to Admission medications   Medication Sig Start Date End Date Taking? Authorizing Provider  acetaminophen (TYLENOL) 325 MG tablet Take 2 tablets  (650 mg total) by mouth every 6 (six) hours as needed. 05/27/16   [name], MD  atorvastatin (LIPITOR) 10 MG tablet Take 10 mg by mouth every morning.     Historical Provider, MD  cycloSPORINE (SANDIMMUNE) 100 MG capsule Take 100 mg by mouth 2 (two) times daily. Take with 25 mg tablet to equal 125 mg twice daily    Historical Provider, MD  cycloSPORINE (SANDIMMUNE) 25 MG capsule Take 25 mg by mouth 2 (two) times daily. Take with 100 mg tablet to equal dose of 125 mg twice daily    Historical Provider, MD  esomeprazole (NEXIUM) 40 MG capsule Take 40 mg by mouth 2 (two) times daily.     Historical Provider, MD  methocarbamol (ROBAXIN) 500 MG tablet Take 1 tablet (500 mg total) by mouth 2 (two) times daily. 05/27/16   [name], MD  predniSONE (DELTASONE) 5 MG tablet Take 5 mg by mouth every morning.     Historical Provider, MD  tamsulosin (FLOMAX) 0.4 MG CAPS capsule Take 0.4 mg by mouth daily.     Historical Provider, MD  vitamin B-12 (CYANOCOBALAMIN) 1000 MCG tablet Take 1,000 mcg by mouth daily.    Historical Provider, MD    Family History Family History  Problem Relation Age of Onset  . Hypertension Sister     Social History Social History  Substance Use Topics  . Smoking status:  Former Smoker    Packs/day: 0.00    Years: 5.00    Quit date: 06/14/1972  . Smokeless tobacco: Never Used  . Alcohol use No     Allergies   Codeine; Enalapril maleate; Nsaids; Tape; and Vasotec   Review of Systems Review of Systems  Musculoskeletal: Positive for arthralgias (left shoulder pain) and back pain (Lower).  All other systems reviewed and are negative.    Physical Exam Updated Vital Signs BP 112/77 (BP Location: Left Arm)   Pulse 78   Temp 98.4 F (36.9 C) (Oral)   Resp 16   Ht 5\' 11"  (1.803 m)   Wt 185 lb (83.9 kg)   SpO2 100%   BMI 25.80 kg/m   Physical Exam  Constitutional: He is oriented to person, place, and time. He appears well-developed.  HENT:  Head:  Normocephalic and atraumatic.  Mouth/Throat: Oropharynx is clear and moist.  Eyes: Conjunctivae and EOM are normal. Pupils are equal, round, and reactive to light.  Neck: Normal range of motion. Neck supple.  Cardiovascular: Normal rate, regular rhythm and normal heart sounds.  Exam reveals no friction rub.   Pulmonary/Chest: Effort normal and breath sounds normal. No respiratory distress. He has no wheezes.  Symmetrical rise and fall of the chest.   Abdominal: Soft.  No pulsating mass.   Musculoskeletal: Normal range of motion.  Capillary refill less than two seconds. Decreased range of motion of the left shoulder, crepitus present. Good range of motion of the elbow and wrist. Degenerative changes involving right and left hand. Right greater than left paraspinal tenderness in the lumbar region. Pain of the lower lumbar spine. No palpable step-off of the thoracic or lumbar spine area. Pulses and temp of extremities symmetrical.  Neurological: He is alert and oriented to person, place, and time.  No motor or sensory deficit involving the lower extremities.   Skin: Skin is warm and dry.  Psychiatric: He has a normal mood and affect.     ED Treatments / Results  DIAGNOSTIC STUDIES: Oxygen Saturation is 100% on RA, normal by my interpretation.    COORDINATION OF CARE: 12:00 AM Discussed treatment plan with pt at bedside and pt agreed to plan, which includes narcotics and outpatient primary care physician  follow-up.   Labs (all labs ordered are listed, but only abnormal results are displayed) Labs Reviewed - No data to display  EKG  EKG Interpretation None       Radiology No results found.  Procedures Procedures (including critical care time)  Medications Ordered in ED Medications - No data to display   Initial Impression / Assessment and Plan / ED Course  I have reviewed the triage vital signs and the nursing notes.  Pertinent labs & imaging results that were available  during my care of the patient were reviewed by me and considered in my medical decision making (see chart for details).       Final Clinical Impressions(s) / ED Diagnoses   MDM: History of degenerative disc disease, history of arthritis, history of renal transplant, and history of hip replacement. Examination of exacerbation of arthritis and degenerative disc disease. Patient will be treated with narcotics every four hours. Patient is to see his primary care physician on Monday for assistance with pain control. He is to call orthopedics for assessment concerning his back.  .  Final diagnoses:  DDD (degenerative disc disease), lumbar    New Prescriptions New Prescriptions   No medications on file   **I  personally performed the services described in this documentation, which was scribed in my presence. The recorded information has been reviewed and is accurate.[name], PA-C 07/25/16 1349    Davonna Belling, MD 07/25/16 [phone]

## 2016-07-25 NOTE — ED Triage Notes (Signed)
Patient complains of lower back pain x 3 days. Also states left should pain with movement.

## 2016-07-28 ENCOUNTER — Ambulatory Visit (HOSPITAL_COMMUNITY)
Admission: RE | Admit: 2016-07-28 | Discharge: 2016-07-28 | Disposition: A | Discharge status: Home / Self Care | Payer: Medicare Other | Source: Ambulatory Visit | Attending: Family Medicine | Admitting: Family Medicine

## 2016-07-28 ENCOUNTER — Other Ambulatory Visit (HOSPITAL_COMMUNITY): Payer: Self-pay | Admitting: Family Medicine

## 2016-07-28 DIAGNOSIS — M25512 Pain in left shoulder: Secondary | ICD-10-CM

## 2016-07-28 DIAGNOSIS — M19012 Primary osteoarthritis, left shoulder: Secondary | ICD-10-CM | POA: Insufficient documentation

## 2016-07-28 DIAGNOSIS — Z0000 Encounter for general adult medical examination without abnormal findings: Secondary | ICD-10-CM | POA: Diagnosis not present

## 2016-07-29 ENCOUNTER — Emergency Department (HOSPITAL_COMMUNITY): Payer: Medicare Other

## 2016-07-29 ENCOUNTER — Encounter (HOSPITAL_COMMUNITY): Payer: Self-pay | Admitting: Emergency Medicine

## 2016-07-29 ENCOUNTER — Emergency Department (HOSPITAL_COMMUNITY)
Admission: EM | Admit: 2016-07-29 | Discharge: 2016-07-29 | Disposition: A | Discharge status: Home / Self Care | Payer: Medicare Other | Attending: Emergency Medicine | Admitting: Emergency Medicine

## 2016-07-29 DIAGNOSIS — E119 Type 2 diabetes mellitus without complications: Secondary | ICD-10-CM | POA: Diagnosis not present

## 2016-07-29 DIAGNOSIS — R112 Nausea with vomiting, unspecified: Secondary | ICD-10-CM | POA: Diagnosis not present

## 2016-07-29 DIAGNOSIS — R197 Diarrhea, unspecified: Secondary | ICD-10-CM

## 2016-07-29 DIAGNOSIS — Z79899 Other long term (current) drug therapy: Secondary | ICD-10-CM | POA: Insufficient documentation

## 2016-07-29 DIAGNOSIS — R1030 Lower abdominal pain, unspecified: Secondary | ICD-10-CM | POA: Diagnosis not present

## 2016-07-29 DIAGNOSIS — R0602 Shortness of breath: Secondary | ICD-10-CM | POA: Diagnosis not present

## 2016-07-29 DIAGNOSIS — Z87891 Personal history of nicotine dependence: Secondary | ICD-10-CM | POA: Insufficient documentation

## 2016-07-29 DIAGNOSIS — K529 Noninfective gastroenteritis and colitis, unspecified: Secondary | ICD-10-CM | POA: Diagnosis not present

## 2016-07-29 DIAGNOSIS — I10 Essential (primary) hypertension: Secondary | ICD-10-CM | POA: Diagnosis not present

## 2016-07-29 LAB — CBC WITH DIFFERENTIAL/PLATELET
BASOS ABS: 0 10*3/uL (ref 0.0–0.1)
Basophils Relative: 0 %
Eosinophils Absolute: 0.2 10*3/uL (ref 0.0–0.7)
Eosinophils Relative: 3 %
HCT: 42.4 % (ref 39.0–52.0)
HEMOGLOBIN: 14.2 g/dL (ref 13.0–17.0)
LYMPHS PCT: 32 %
Lymphs Abs: 2.5 10*3/uL (ref 0.7–4.0)
MCH: 22.9 pg — ABNORMAL LOW (ref 26.0–34.0)
MCHC: 33.5 g/dL (ref 30.0–36.0)
MCV: 68.5 fL — ABNORMAL LOW (ref 78.0–100.0)
MONOS PCT: 10 %
Monocytes Absolute: 0.8 10*3/uL (ref 0.1–1.0)
NEUTROS ABS: 4.2 10*3/uL (ref 1.7–7.7)
Neutrophils Relative %: 55 %
Platelets: 174 10*3/uL (ref 150–400)
RBC: 6.19 MIL/uL — AB (ref 4.22–5.81)
RDW: 15.6 % — ABNORMAL HIGH (ref 11.5–15.5)
WBC: 7.7 10*3/uL (ref 4.0–10.5)

## 2016-07-29 LAB — COMPREHENSIVE METABOLIC PANEL
ALBUMIN: 4 g/dL (ref 3.5–5.0)
ALK PHOS: 53 U/L (ref 38–126)
ALT: 20 U/L (ref 17–63)
AST: 30 U/L (ref 15–41)
Anion gap: 7 (ref 5–15)
BUN: 20 mg/dL (ref 6–20)
CALCIUM: 9.6 mg/dL (ref 8.9–10.3)
CO2: 26 mmol/L (ref 22–32)
CREATININE: 1.36 mg/dL — AB (ref 0.61–1.24)
Chloride: 104 mmol/L (ref 101–111)
GFR calc Af Amer: >60 mL/min (ref >60)
GFR calc non Af Amer: 55 mL/min — ABNORMAL LOW (ref >60)
GLUCOSE: 94 mg/dL (ref 65–99)
Potassium: 4.2 mmol/L (ref 3.5–5.1)
SODIUM: 137 mmol/L (ref 135–145)
Total Bilirubin: 0.7 mg/dL (ref 0.3–1.2)
Total Protein: 7.8 g/dL (ref 6.5–8.1)

## 2016-07-29 LAB — URINALYSIS, ROUTINE W REFLEX MICROSCOPIC
BILIRUBIN URINE: NEGATIVE
GLUCOSE, UA: NEGATIVE mg/dL
Hgb urine dipstick: NEGATIVE
KETONES UR: NEGATIVE mg/dL
LEUKOCYTES UA: NEGATIVE
Nitrite: NEGATIVE
PH: 6 (ref 5.0–8.0)
Protein, ur: NEGATIVE mg/dL
Specific Gravity, Urine: 1.021 (ref 1.005–1.030)

## 2016-07-29 MED ORDER — ONDANSETRON HCL 4 MG/2ML IJ SOLN
4.0000 mg | Freq: Once | INTRAMUSCULAR | Status: AC
  Administered 2016-07-29: 4 mg via INTRAVENOUS
  Filled 2016-07-29: qty 2

## 2016-07-29 MED ORDER — SODIUM CHLORIDE 0.9 % IV BOLUS (SEPSIS)
1000.0000 mL | Freq: Once | INTRAVENOUS | Status: AC
  Administered 2016-07-29: 1000 mL via INTRAVENOUS

## 2016-07-29 MED ORDER — ONDANSETRON 4 MG PO TBDP: ORAL_TABLET | ORAL | 0 refills | Status: DC

## 2016-07-29 NOTE — ED Provider Notes (Signed)
[facility] DEPT Provider Note   CSN: [id number] Arrival date & time: 07/29/16  [id number]   By signing my name below, I, Robert Durham, attest that this documentation has been prepared under the direction and in the presence of [name], MD  Electronically Signed: Delton Durham, ED Scribe. 07/29/16. 10:06 AM.   History   Chief Complaint Chief Complaint  Patient presents with  . Emesis   The history is provided by the patient. No language interpreter was used.  Emesis   This is a new problem. The current episode started 1 to 2 hours ago. The problem occurs 2 to 4 times per day. The problem has been gradually improving. There has been no fever. Associated symptoms include abdominal pain. Pertinent negatives include no cough, no diarrhea and no headaches.   HPI Comments:  Robert Durham is a 61 y.o. male, with a PMHx of HTN, DM and PSHx of kidney transplant in 1994, who presents to the Emergency Department complaining of intermittent episodes of vomiting onset today. He reports 3 episodes of vomiting and mild lower abdominal pain. No alleviating factors noted. Pt denies diarrhea or any other associated symptoms. He is on immunosuppressants for his kidney issue. No other complaints noted.   Past Medical History:  Diagnosis Date  . Chronic back pain   . Chronic hip pain   . Depression   . Diabetes mellitus   . Diverticulitis   . H/O kidney transplant   . Hypertension   . Kidney stone   . Renal disorder   . Urinary retention     Patient Active Problem List   Diagnosis Date Noted  . Chest pain 03/20/2016    Past Surgical History:  Procedure Laterality Date  . COLONOSCOPY  05/27/2003   [id number] terminal ileum,rectum, and colon  . HERNIA REPAIR    . HIP SURGERY    . KIDNEY TRANSPLANT    . NEPHRECTOMY TRANSPLANTED ORGAN         Home Medications    Prior to Admission medications   Medication Sig Start Date End Date Taking? Authorizing Provider  acetaminophen  (TYLENOL) 325 MG tablet Take 2 tablets (650 mg total) by mouth every 6 (six) hours as needed. 05/27/16  Yes [name], MD  atorvastatin (LIPITOR) 10 MG tablet Take 10 mg by mouth every morning.    Yes Historical Provider, MD  cycloSPORINE (SANDIMMUNE) 100 MG capsule Take 100 mg by mouth 2 (two) times daily. Take with 25 mg tablet to equal 125 mg twice daily   Yes Historical Provider, MD  cycloSPORINE (SANDIMMUNE) 25 MG capsule Take 25 mg by mouth 2 (two) times daily. Take with 100 mg tablet to equal dose of 125 mg twice daily   Yes Historical Provider, MD  esomeprazole (NEXIUM) 40 MG capsule Take 40 mg by mouth 2 (two) times daily.    Yes Historical Provider, MD  predniSONE (DELTASONE) 5 MG tablet Take 5 mg by mouth every morning.    Yes Historical Provider, MD  tamsulosin (FLOMAX) 0.4 MG CAPS capsule Take 0.4 mg by mouth daily.    Yes Historical Provider, MD  vitamin B-12 (CYANOCOBALAMIN) 1000 MCG tablet Take 1,000 mcg by mouth daily.   Yes Historical Provider, MD  HYDROcodone-acetaminophen (NORCO/VICODIN) 5-325 MG tablet Take 1 tablet by mouth every 4 (four) hours as needed. 07/25/16   [name], PA-C  methocarbamol (ROBAXIN) 500 MG tablet Take 1 tablet (500 mg total) by mouth 2 (two) times daily. Patient not taking: Reported on 07/25/2016  05/27/16   [name], MD  ondansetron (ZOFRAN) 4 MG tablet Take 1 tablet (4 mg total) by mouth every 6 (six) hours. Patient not taking: Reported on 07/29/2016 07/25/16   [name], PA-C    Family History Family History  Problem Relation Age of Onset  . Hypertension Sister     Social History Social History  Substance Use Topics  . Smoking status: Former Smoker    Packs/day: 0.00    Years: 5.00    Quit date: 06/14/1972  . Smokeless tobacco: Never Used  . Alcohol use No     Allergies   Codeine; Enalapril maleate; Nsaids; Tape; and Vasotec   Review of Systems Review of Systems  Constitutional: Negative for appetite change and fatigue.    HENT: Negative for congestion, ear discharge and sinus pressure.   Eyes: Negative for discharge.  Respiratory: Negative for cough.   Cardiovascular: Negative for chest pain.  Gastrointestinal: Positive for abdominal pain and vomiting. Negative for diarrhea.  Genitourinary: Negative for frequency and hematuria.  Musculoskeletal: Negative for back pain.  Skin: Negative for rash.  Neurological: Negative for seizures and headaches.  Psychiatric/Behavioral: Negative for hallucinations.     Physical Exam Updated Vital Signs BP 110/80   Pulse 60   Temp 97.6 F (36.4 C)   Resp 18   Ht 5\' 11"  (1.803 m)   Wt 185 lb (83.9 kg)   SpO2 100%   BMI 25.80 kg/m   Physical Exam  Constitutional: He is oriented to person, place, and time. He appears well-developed.  HENT:  Head: Normocephalic.  Mouth/Throat: Mucous membranes are dry (mildly).  Eyes: Conjunctivae and EOM are normal. No scleral icterus.  Neck: Neck supple. No thyromegaly present.  Cardiovascular: Normal rate and regular rhythm.  Exam reveals no gallop and no friction rub.   No murmur heard. Pulmonary/Chest: No stridor. He has no wheezes. He has no rales. He exhibits no tenderness.  Abdominal: He exhibits no distension. There is tenderness (minimal) in the suprapubic area. There is no rebound.  Musculoskeletal: Normal range of motion. He exhibits no edema.  Lymphadenopathy:    He has no cervical adenopathy.  Neurological: He is oriented to person, place, and time. He exhibits normal muscle tone. Coordination normal.  Skin: No rash noted. No erythema.  Psychiatric: He has a normal mood and affect. His behavior is normal.     ED Treatments / Results  DIAGNOSTIC STUDIES:  Oxygen Saturation is 100% on RA, normal by my interpretation.    COORDINATION OF CARE:  10:03 AM Discussed treatment plan with pt at bedside and pt agreed to plan.  Labs (all labs ordered are listed, but only abnormal results are displayed) Labs  Reviewed - No data to display  EKG  EKG Interpretation None       Radiology Dg Shoulder Left  Result Date: 07/28/2016 CLINICAL DATA:  Pain.  No known injury. EXAM: LEFT SHOULDER - 2+ VIEW COMPARISON:  CT 03/19/2016 . FINDINGS: No acute bony or joint abnormality identified. Mild acromioclavicular and glenohumeral degenerative change. IMPRESSION: Mild acromioclavicular glenohumeral degenerative change. No acute abnormality . Electronically Signed   By: Marcello Moores  Register   On: 07/28/2016 14:04    Procedures Procedures (including critical care time)  Medications Ordered in ED Medications - No data to display   Initial Impression / Assessment and Plan / ED Course  I have reviewed the triage vital signs and the nursing notes.  Pertinent labs & imaging results that were available during my care  of the patient were reviewed by me and considered in my medical decision making (see chart for details).     Patient with gastroenteritis. Labs unremarkable. Patient will be sent home with Zofran and will follow-up as needed  Final Clinical Impressions(s) / ED Diagnoses   Final diagnoses:  None    New Prescriptions New Prescriptions   No medications on file  The chart was scribed for me under my direct supervision.  I personally performed the history, physical, and medical decision making and all procedures in the evaluation of this patient.[name], MD 07/29/16 [phone]

## 2016-07-29 NOTE — ED Triage Notes (Signed)
Pt c/o generalized weakness/n/v/chills this am.

## 2016-07-29 NOTE — Discharge Instructions (Signed)
Drink plenty of fluids take Tylenol for fevers and aches. Follow-up with their family doctor next week if not improving

## 2016-08-09 DIAGNOSIS — H40013 Open angle with borderline findings, low risk, bilateral: Secondary | ICD-10-CM | POA: Diagnosis not present

## 2016-08-09 DIAGNOSIS — H11133 Conjunctival pigmentations, bilateral: Secondary | ICD-10-CM | POA: Diagnosis not present

## 2016-08-12 DIAGNOSIS — I10 Essential (primary) hypertension: Secondary | ICD-10-CM | POA: Diagnosis not present

## 2016-09-12 DIAGNOSIS — I10 Essential (primary) hypertension: Secondary | ICD-10-CM | POA: Diagnosis not present

## 2016-10-03 ENCOUNTER — Emergency Department (HOSPITAL_COMMUNITY)
Admission: EM | Admit: 2016-10-03 | Discharge: 2016-10-03 | Disposition: A | Discharge status: Home / Self Care | Payer: Medicare Other | Attending: Emergency Medicine | Admitting: Emergency Medicine

## 2016-10-03 ENCOUNTER — Encounter (HOSPITAL_COMMUNITY): Payer: Self-pay

## 2016-10-03 ENCOUNTER — Emergency Department (HOSPITAL_COMMUNITY): Payer: Medicare Other

## 2016-10-03 DIAGNOSIS — Z79899 Other long term (current) drug therapy: Secondary | ICD-10-CM | POA: Insufficient documentation

## 2016-10-03 DIAGNOSIS — E119 Type 2 diabetes mellitus without complications: Secondary | ICD-10-CM | POA: Diagnosis not present

## 2016-10-03 DIAGNOSIS — Z87891 Personal history of nicotine dependence: Secondary | ICD-10-CM | POA: Diagnosis not present

## 2016-10-03 DIAGNOSIS — K5732 Diverticulitis of large intestine without perforation or abscess without bleeding: Secondary | ICD-10-CM | POA: Insufficient documentation

## 2016-10-03 DIAGNOSIS — R1031 Right lower quadrant pain: Secondary | ICD-10-CM | POA: Diagnosis present

## 2016-10-03 DIAGNOSIS — I10 Essential (primary) hypertension: Secondary | ICD-10-CM | POA: Insufficient documentation

## 2016-10-03 DIAGNOSIS — R1111 Vomiting without nausea: Secondary | ICD-10-CM | POA: Diagnosis not present

## 2016-10-03 DIAGNOSIS — R1110 Vomiting, unspecified: Secondary | ICD-10-CM | POA: Diagnosis not present

## 2016-10-03 LAB — URINALYSIS, ROUTINE W REFLEX MICROSCOPIC
Bilirubin Urine: NEGATIVE
GLUCOSE, UA: NEGATIVE mg/dL
Hgb urine dipstick: NEGATIVE
KETONES UR: NEGATIVE mg/dL
Leukocytes, UA: NEGATIVE
NITRITE: NEGATIVE
PH: 6 (ref 5.0–8.0)
Protein, ur: NEGATIVE mg/dL
SPECIFIC GRAVITY, URINE: 1.021 (ref 1.005–1.030)

## 2016-10-03 LAB — COMPREHENSIVE METABOLIC PANEL
ALK PHOS: 59 U/L (ref 38–126)
ALT: 17 U/L (ref 17–63)
ANION GAP: 7 (ref 5–15)
AST: 24 U/L (ref 15–41)
Albumin: 3.6 g/dL (ref 3.5–5.0)
BUN: 18 mg/dL (ref 6–20)
CALCIUM: 8.7 mg/dL — AB (ref 8.9–10.3)
CO2: 23 mmol/L (ref 22–32)
Chloride: 109 mmol/L (ref 101–111)
Creatinine, Ser: 1.26 mg/dL — ABNORMAL HIGH (ref 0.61–1.24)
GFR calc Af Amer: >60 mL/min (ref >60)
GFR calc non Af Amer: 60 mL/min — ABNORMAL LOW (ref >60)
Glucose, Bld: 96 mg/dL (ref 65–99)
Potassium: 3.5 mmol/L (ref 3.5–5.1)
SODIUM: 139 mmol/L (ref 135–145)
Total Bilirubin: 0.6 mg/dL (ref 0.3–1.2)
Total Protein: 7.1 g/dL (ref 6.5–8.1)

## 2016-10-03 LAB — CBC WITH DIFFERENTIAL/PLATELET
BASOS ABS: 0 10*3/uL (ref 0.0–0.1)
Basophils Relative: 0 %
EOS ABS: 0.3 10*3/uL (ref 0.0–0.7)
Eosinophils Relative: 4 %
HCT: 38.3 % — ABNORMAL LOW (ref 39.0–52.0)
HEMOGLOBIN: 12.7 g/dL — AB (ref 13.0–17.0)
Lymphocytes Relative: 33 %
Lymphs Abs: 3 10*3/uL (ref 0.7–4.0)
MCH: 22.9 pg — ABNORMAL LOW (ref 26.0–34.0)
MCHC: 33.2 g/dL (ref 30.0–36.0)
MCV: 69 fL — ABNORMAL LOW (ref 78.0–100.0)
Monocytes Absolute: 0.9 10*3/uL (ref 0.1–1.0)
Monocytes Relative: 9 %
NEUTROS PCT: 54 %
Neutro Abs: 5 10*3/uL (ref 1.7–7.7)
Platelets: 154 10*3/uL (ref 150–400)
RBC: 5.55 MIL/uL (ref 4.22–5.81)
RDW: 15.4 % (ref 11.5–15.5)
WBC: 9.3 10*3/uL (ref 4.0–10.5)

## 2016-10-03 MED ORDER — METRONIDAZOLE 500 MG PO TABS: 500.0000 mg | ORAL_TABLET | Freq: Three times a day (TID) | ORAL | 0 refills | Status: DC

## 2016-10-03 MED ORDER — CIPROFLOXACIN HCL 500 MG PO TABS: 500.0000 mg | ORAL_TABLET | Freq: Two times a day (BID) | ORAL | 0 refills | Status: DC

## 2016-10-03 MED ORDER — FENTANYL CITRATE (PF) 100 MCG/2ML IJ SOLN
50.0000 ug | Freq: Once | INTRAMUSCULAR | Status: AC
  Administered 2016-10-03: 50 ug via INTRAVENOUS
  Filled 2016-10-03: qty 2

## 2016-10-03 MED ORDER — ONDANSETRON HCL 4 MG PO TABS: 4.0000 mg | ORAL_TABLET | Freq: Three times a day (TID) | ORAL | 0 refills | Status: DC | PRN

## 2016-10-03 MED ORDER — CIPROFLOXACIN IN D5W 400 MG/200ML IV SOLN
400.0000 mg | Freq: Once | INTRAVENOUS | Status: AC
  Administered 2016-10-03: 400 mg via INTRAVENOUS
  Filled 2016-10-03: qty 200

## 2016-10-03 MED ORDER — ONDANSETRON HCL 4 MG/2ML IJ SOLN
4.0000 mg | Freq: Once | INTRAMUSCULAR | Status: AC
  Administered 2016-10-03: 4 mg via INTRAVENOUS
  Filled 2016-10-03: qty 2

## 2016-10-03 MED ORDER — SODIUM CHLORIDE 0.9 % IV BOLUS (SEPSIS)
1000.0000 mL | Freq: Once | INTRAVENOUS | Status: AC
  Administered 2016-10-03: 1000 mL via INTRAVENOUS

## 2016-10-03 MED ORDER — HYDROCODONE-ACETAMINOPHEN 5-325 MG PO TABS: 1.0000 | ORAL_TABLET | Freq: Four times a day (QID) | ORAL | 0 refills | Status: DC | PRN

## 2016-10-03 MED ORDER — METRONIDAZOLE IN NACL 5-0.79 MG/ML-% IV SOLN
500.0000 mg | Freq: Once | INTRAVENOUS | Status: AC
  Administered 2016-10-03: 500 mg via INTRAVENOUS
  Filled 2016-10-03: qty 100

## 2016-10-03 NOTE — ED Provider Notes (Signed)
[facility] DEPT Provider Note   CSN: [id number] Arrival date & time: 10/03/16  0326  Time seen 04:13 AM     History   Chief Complaint Chief Complaint  Patient presents with  . Abdominal Pain    HPI Curtiss Mahmood Kirschenbaum is a 61 y.o. male.  HPI Patient states 3 days ago he started having pain in his bilateral lower abdomen that would come and go although he states it could last as long as all day long. He has had nausea with vomiting once daily mainly in the morning. He denies diarrhea or constipation although he states he is having less bowel movements than normal. He denies feeling bloated or having any urinary symptoms such as dysuria, frequency, or hematuria. He denies any fever. He states he's never had this pain before. He states walking makes the pain worse however deep breathing or movement does not. Nothing makes it feel better. 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  PCP Maggie Font, MD Transplant physician Dr Drue Dun, [facility]  Past Medical History:  Diagnosis Date  . Chronic back pain   . Chronic hip pain   . Depression   . Diabetes mellitus   . Diverticulitis   . H/O kidney transplant   . Hypertension   . Kidney stone   . Renal disorder   . Urinary retention     Patient Active Problem List   Diagnosis Date Noted  . Chest pain 03/20/2016    Past Surgical History:  Procedure Laterality Date  . COLONOSCOPY  05/27/2003   [id number] terminal ileum,rectum, and colon  . HERNIA REPAIR    . HIP SURGERY    . KIDNEY TRANSPLANT    . NEPHRECTOMY TRANSPLANTED ORGAN         Home Medications    Prior to Admission medications   Medication Sig Start Date End Date Taking? Authorizing Provider  promethazine (PHENERGAN) 25 MG tablet Take 25 mg by mouth every 6 (six) hours as needed for nausea or vomiting.   Yes Historical Provider, MD  acetaminophen (TYLENOL) 325 MG tablet Take 2  tablets (650 mg total) by mouth every 6 (six) hours as needed. 05/27/16   [name], MD  atorvastatin (LIPITOR) 10 MG tablet Take 10 mg by mouth every morning.     Historical Provider, MD  ciprofloxacin (CIPRO) 500 MG tablet Take 1 tablet (500 mg total) by mouth 2 (two) times daily. 10/03/16   Rolland Porter, MD  cycloSPORINE (SANDIMMUNE) 100 MG capsule Take 100 mg by mouth 2 (two) times daily. Take with 25 mg tablet to equal 125 mg twice daily    Historical Provider, MD  cycloSPORINE (SANDIMMUNE) 25 MG capsule Take 25 mg by mouth 2 (two) times daily. Take with 100 mg tablet to equal dose of 125 mg twice daily    Historical Provider, MD  esomeprazole (NEXIUM) 40 MG capsule Take 40 mg by mouth 2 (two) times daily.     Historical Provider, MD  HYDROcodone-acetaminophen (NORCO/VICODIN) 5-325 MG tablet Take 1 tablet by mouth every 6 (six) hours as needed for moderate pain. 10/03/16   Rolland Porter, MD  methocarbamol (ROBAXIN) 500 MG tablet Take 1 tablet (500 mg total) by mouth 2 (two) times daily. Patient not taking: Reported on 07/25/2016 05/27/16   [name], MD  metroNIDAZOLE (FLAGYL) 500 MG tablet Take 1 tablet (500 mg total) by mouth 3 (three) times daily. 10/03/16   Avnoor Koury  Tomi Bamberger, MD  ondansetron (ZOFRAN ODT) 4 MG disintegrating tablet 4mg  ODT q4 hours prn nausea/vomit 07/29/16   [name], MD  ondansetron (ZOFRAN) 4 MG tablet Take 1 tablet (4 mg total) by mouth every 8 (eight) hours as needed. 10/03/16   Rolland Porter, MD  predniSONE (DELTASONE) 5 MG tablet Take 5 mg by mouth every morning.     Historical Provider, MD  tamsulosin (FLOMAX) 0.4 MG CAPS capsule Take 0.4 mg by mouth daily.     Historical Provider, MD  vitamin B-12 (CYANOCOBALAMIN) 1000 MCG tablet Take 1,000 mcg by mouth daily.    Historical Provider, MD    Family History Family History  Problem Relation Age of Onset  . Hypertension Sister     Social History Social History  Substance Use Topics  . Smoking status: Former Smoker     Packs/day: 0.00    Years: 5.00    Quit date: 06/14/1972  . Smokeless tobacco: Never Used  . Alcohol use No  on disability   Allergies   Codeine; Enalapril maleate; Nsaids; Tape; and Vasotec   Review of Systems Review of Systems  All other systems reviewed and are negative.    Physical Exam Updated Vital Signs BP 125/82 (BP Location: Right Arm)   Pulse 63   Temp 98.5 F (36.9 C) (Oral)   Resp 18   Ht 5\' 11"  (1.803 m)   Wt 180 lb (81.6 kg)   SpO2 100%   BMI 25.10 kg/m   Vital signs normal    Physical Exam  Constitutional: He is oriented to person, place, and time. He appears well-developed and well-nourished.  Non-toxic appearance. He does not appear ill. No distress.  HENT:  Head: Normocephalic and atraumatic.  Right Ear: External ear normal.  Left Ear: External ear normal.  Nose: Nose normal. No mucosal edema or rhinorrhea.  Mouth/Throat: Oropharynx is clear and moist and mucous membranes are normal. No dental abscesses or uvula swelling.  Eyes: Conjunctivae and EOM are normal. Pupils are equal, round, and reactive to light.  Neck: Normal range of motion and full passive range of motion without pain. Neck supple.  Cardiovascular: Normal rate, regular rhythm and normal heart sounds.  Exam reveals no gallop and no friction rub.   No murmur heard. Pulmonary/Chest: Effort normal and breath sounds normal. No respiratory distress. He has no wheezes. He has no rhonchi. He has no rales. He exhibits no tenderness and no crepitus.  Abdominal: Soft. Normal appearance and bowel sounds are normal. He exhibits no distension. There is tenderness in the right lower quadrant. There is no rebound and no guarding.    Has a healed surgical scar in the Left abd c/w his renal transplant  Musculoskeletal: Normal range of motion. He exhibits no edema or tenderness.  Moves all extremities well.   Neurological: He is alert and oriented to person, place, and time. He has normal strength. No  cranial nerve deficit.  Skin: Skin is warm, dry and intact. No rash noted. No erythema. No pallor.  Psychiatric: He has a normal mood and affect. His speech is normal and behavior is normal. His mood appears not anxious.  Nursing note and vitals reviewed.    ED Treatments / Results  Labs (all labs ordered are listed, but only abnormal results are displayed) Results for orders placed or performed during the hospital encounter of 10/03/16  CBC with Differential  Result Value Ref Range   WBC 9.3 4.0 - 10.5 K/uL   RBC 5.55 4.22 -  5.81 MIL/uL   Hemoglobin 12.7 (L) 13.0 - 17.0 g/dL   HCT 38.3 (L) 39.0 - 52.0 %   MCV 69.0 (L) 78.0 - 100.0 fL   MCH 22.9 (L) 26.0 - 34.0 pg   MCHC 33.2 30.0 - 36.0 g/dL   RDW 15.4 11.5 - 15.5 %   Platelets 154 150 - 400 K/uL   Neutrophils Relative % 54 %   Neutro Abs 5.0 1.7 - 7.7 K/uL   Lymphocytes Relative 33 %   Lymphs Abs 3.0 0.7 - 4.0 K/uL   Monocytes Relative 9 %   Monocytes Absolute 0.9 0.1 - 1.0 K/uL   Eosinophils Relative 4 %   Eosinophils Absolute 0.3 0.0 - 0.7 K/uL   Basophils Relative 0 %   Basophils Absolute 0.0 0.0 - 0.1 K/uL  Comprehensive metabolic panel  Result Value Ref Range   Sodium 139 135 - 145 mmol/L   Potassium 3.5 3.5 - 5.1 mmol/L   Chloride 109 101 - 111 mmol/L   CO2 23 22 - 32 mmol/L   Glucose, Bld 96 65 - 99 mg/dL   BUN 18 6 - 20 mg/dL   Creatinine, Ser 1.26 (H) 0.61 - 1.24 mg/dL   Calcium 8.7 (L) 8.9 - 10.3 mg/dL   Total Protein 7.1 6.5 - 8.1 g/dL   Albumin 3.6 3.5 - 5.0 g/dL   AST 24 15 - 41 U/L   ALT 17 17 - 63 U/L   Alkaline Phosphatase 59 38 - 126 U/L   Total Bilirubin 0.6 0.3 - 1.2 mg/dL   GFR calc non Af Amer 60 (L) >60 mL/min   GFR calc Af Amer >60 >60 mL/min   Anion gap 7 5 - 15  Urinalysis, Routine w reflex microscopic  Result Value Ref Range   Color, Urine YELLOW YELLOW   APPearance CLEAR CLEAR   Specific Gravity, Urine 1.021 1.005 - 1.030   pH 6.0 5.0 - 8.0   Glucose, UA NEGATIVE NEGATIVE mg/dL    Hgb urine dipstick NEGATIVE NEGATIVE   Bilirubin Urine NEGATIVE NEGATIVE   Ketones, ur NEGATIVE NEGATIVE mg/dL   Protein, ur NEGATIVE NEGATIVE mg/dL   Nitrite NEGATIVE NEGATIVE   Leukocytes, UA NEGATIVE NEGATIVE   Laboratory interpretation all normal except mild anemia     EKG  EKG Interpretation None       Radiology Ct Abdomen Pelvis Wo Contrast  Result Date: 10/03/2016 CLINICAL DATA:  Initial evaluation for acute right lower quadrant pain, vomiting. EXAM: CT ABDOMEN AND PELVIS WITHOUT CONTRAST TECHNIQUE: Multidetector CT imaging of the abdomen and pelvis was performed following the standard protocol without IV contrast. COMPARISON:  Prior CT from 03/08/2015. FINDINGS: Lower chest: Mild bibasilar atelectatic changes present within the visualized lung bases. Visualized lung bases are otherwise clear. Hepatobiliary: Few scattered subcentimeter hypodensities noted within the liver, indeterminate, but of doubtful significance. Liver otherwise unremarkable. Gallbladder normal. No biliary dilatation. Pancreas: Pancreas within normal limits. Spleen: Spleen within normal limits. Adrenals/Urinary Tract: Adrenal glands are unremarkable. Native kidneys are small and atrophic. Renal transplant in place within the left hemipelvis. 3 mm nonobstructive calculus present within the renal transplant. No hydronephrosis or hydroureter. Partially distended bladder within normal limits. Stomach/Bowel: Stomach within normal limits. No evidence for bowel obstruction. Appendix within normal limits. Sigmoid colon is somewhat redundant, extending into the right lower quadrant. Extensive sigmoid diverticulosis. There is hazy inflammatory stranding about a few diverticula within the right lower quadrant, consistent with acute diverticulitis (Series 2, image 65). The inflamed diverticulum is somewhat  prominent extending posteriorly from the native bowel. This is similar to prior study, with no definite evidence for  perforation or other complication. No other acute inflammatory changes seen about the bowels. Vascular/Lymphatic: Scattered atheromatous plaque within the intra-abdominal aorta. No aneurysm. No adenopathy. Reproductive: Prostate normal. Other: Fact containing right inguinal hernia noted. No free air or fluid. Musculoskeletal: No acute osseus abnormality. No worrisome lytic or blastic osseous lesions. Left total hip arthroplasty noted. IMPRESSION: 1. Findings consistent with acute sigmoid diverticulitis. No complication identified. 2. No other acute intra-abdominal or pelvic process. 3. 3 mm nonobstructive calculus within the left lower quadrant renal transplant. 4. Aortic atherosclerosis. 5. Left total hip arthroplasty. Electronically Signed   By: Jeannine Boga M.D.   On: 10/03/2016 05:13    Procedures Procedures (including critical care time)  Medications Ordered in ED Medications  metroNIDAZOLE (FLAGYL) IVPB 500 mg (500 mg Intravenous New Bag/Given 10/03/16 0707)  sodium chloride 0.9 % bolus 1,000 mL (0 mLs Intravenous Stopped 10/03/16 0542)  fentaNYL (SUBLIMAZE) injection 50 mcg (50 mcg Intravenous Given 10/03/16 0428)  ondansetron (ZOFRAN) injection 4 mg (4 mg Intravenous Given 10/03/16 0428)  ciprofloxacin (CIPRO) IVPB 400 mg (0 mg Intravenous Stopped 10/03/16 0704)     Initial Impression / Assessment and Plan / ED Course  I have reviewed the triage vital signs and the nursing notes.  Pertinent labs & imaging results that were available during my care of the patient were reviewed by me and considered in my medical decision making (see chart for details).  After patient's exam I concern was he could have appendicitis. CT scan was done without contrast however because of his renal transplant.  After reviewing his CT scan patient was started on IV Cipro and Flagyl for diverticulitis. I talked to the patient about his test results and he feels like he would like to try outpatient treatment  first.  Review of the [facility] shows patient gets #40 hydrocodone 5/325 monthly from his PCP, last filled March 17. He also received other prescriptions from dentist, #15 tablets on March 27 and #12 tablets on March 8.  Final Clinical Impressions(s) / ED Diagnoses   Final diagnoses:  Sigmoid diverticulitis    New Prescriptions New Prescriptions   CIPROFLOXACIN (CIPRO) 500 MG TABLET    Take 1 tablet (500 mg total) by mouth 2 (two) times daily.   HYDROCODONE-ACETAMINOPHEN (NORCO/VICODIN) 5-325 MG TABLET    Take 1 tablet by mouth every 6 (six) hours as needed for moderate pain.   METRONIDAZOLE (FLAGYL) 500 MG TABLET    Take 1 tablet (500 mg total) by mouth 3 (three) times daily.   ONDANSETRON (ZOFRAN) 4 MG TABLET    Take 1 tablet (4 mg total) by mouth every 8 (eight) hours as needed.    Plan discharge  Rolland Porter, MD, Barbette Or, MD 10/03/16 [phone]

## 2016-10-03 NOTE — ED Triage Notes (Addendum)
Lower abdominal pain that started a couple of hours ago accompanied by nausea without vomiting. Pt reports last BM was yesterday. Pt reports being out of hydrocodone.

## 2016-10-03 NOTE — ED Notes (Signed)
Pt in CT at this time.

## 2016-10-03 NOTE — Discharge Instructions (Signed)
Follow the clear liquid diet for the next 24-48 hours. Then start the bland diet. Take the antibiotics until gone. Take the hydrocodone for pain as needed. Return to the ED if you get a fever, get worsening pain, you get blood from your rectum, you have uncontrolled vomiting.  After you are better follow a high fiber diet to prevent further episodes.  Avoid peanuts, popcorn however.

## 2016-10-04 DIAGNOSIS — K5790 Diverticulosis of intestine, part unspecified, without perforation or abscess without bleeding: Secondary | ICD-10-CM | POA: Diagnosis not present

## 2016-10-12 DIAGNOSIS — I10 Essential (primary) hypertension: Secondary | ICD-10-CM | POA: Diagnosis not present

## 2016-10-19 DIAGNOSIS — Z87891 Personal history of nicotine dependence: Secondary | ICD-10-CM | POA: Diagnosis not present

## 2016-10-19 DIAGNOSIS — D899 Disorder involving the immune mechanism, unspecified: Secondary | ICD-10-CM | POA: Diagnosis not present

## 2016-10-19 DIAGNOSIS — D631 Anemia in chronic kidney disease: Secondary | ICD-10-CM | POA: Diagnosis not present

## 2016-10-19 DIAGNOSIS — E785 Hyperlipidemia, unspecified: Secondary | ICD-10-CM | POA: Diagnosis not present

## 2016-10-19 DIAGNOSIS — Z992 Dependence on renal dialysis: Secondary | ICD-10-CM | POA: Diagnosis not present

## 2016-10-19 DIAGNOSIS — I120 Hypertensive chronic kidney disease with stage 5 chronic kidney disease or end stage renal disease: Secondary | ICD-10-CM | POA: Diagnosis not present

## 2016-10-19 DIAGNOSIS — Z79899 Other long term (current) drug therapy: Secondary | ICD-10-CM | POA: Diagnosis not present

## 2016-10-19 DIAGNOSIS — Z4822 Encounter for aftercare following kidney transplant: Secondary | ICD-10-CM | POA: Diagnosis not present

## 2016-10-19 DIAGNOSIS — N186 End stage renal disease: Secondary | ICD-10-CM | POA: Diagnosis not present

## 2016-10-19 DIAGNOSIS — Z940 Kidney transplant status: Secondary | ICD-10-CM | POA: Diagnosis not present

## 2016-10-22 ENCOUNTER — Emergency Department (HOSPITAL_COMMUNITY)
Admission: EM | Admit: 2016-10-22 | Discharge: 2016-10-22 | Disposition: A | Discharge status: Home / Self Care | Payer: Medicare Other | Attending: Emergency Medicine | Admitting: Emergency Medicine

## 2016-10-22 ENCOUNTER — Encounter (HOSPITAL_COMMUNITY): Payer: Self-pay | Admitting: Emergency Medicine

## 2016-10-22 DIAGNOSIS — M5441 Lumbago with sciatica, right side: Secondary | ICD-10-CM | POA: Diagnosis not present

## 2016-10-22 DIAGNOSIS — I10 Essential (primary) hypertension: Secondary | ICD-10-CM | POA: Insufficient documentation

## 2016-10-22 DIAGNOSIS — Z79899 Other long term (current) drug therapy: Secondary | ICD-10-CM | POA: Insufficient documentation

## 2016-10-22 DIAGNOSIS — Z87891 Personal history of nicotine dependence: Secondary | ICD-10-CM | POA: Insufficient documentation

## 2016-10-22 DIAGNOSIS — G8929 Other chronic pain: Secondary | ICD-10-CM

## 2016-10-22 DIAGNOSIS — M545 Low back pain: Secondary | ICD-10-CM | POA: Diagnosis not present

## 2016-10-22 DIAGNOSIS — E119 Type 2 diabetes mellitus without complications: Secondary | ICD-10-CM | POA: Insufficient documentation

## 2016-10-22 MED ORDER — CYCLOBENZAPRINE HCL 10 MG PO TABS
5.0000 mg | ORAL_TABLET | Freq: Once | ORAL | Status: AC
  Administered 2016-10-22: 5 mg via ORAL
  Filled 2016-10-22: qty 1

## 2016-10-22 MED ORDER — CYCLOBENZAPRINE HCL 10 MG PO TABS: 5.0000 mg | ORAL_TABLET | Freq: Two times a day (BID) | ORAL | 0 refills | Status: DC | PRN

## 2016-10-22 NOTE — ED Triage Notes (Signed)
Pt reports right sided low back pain since last night with no injury.

## 2016-10-22 NOTE — Discharge Instructions (Signed)
Take medications as prescribed.  Follow-up with your primary care doctor in 24-48 hours for further evaluation.  Emergency department for any fever, worsening back pain, numbness or weakness of the arms or legs, difficulty walking, any other worsening or concerning symptoms.

## 2016-10-22 NOTE — ED Provider Notes (Signed)
[facility] DEPT Provider Note   CSN: [id number] Arrival date & time: 10/22/16  6834     History   Chief Complaint Chief Complaint  Patient presents with  . Back Pain    HPI Robert Durham is a 61 y.o. male with PMH/o Degenerative Disc Disease who presents with right lower back pain that began last night. He denies Any recent trauma or injury. He states that current pain feels similar to his past episodes back pain; No new symptoms. He has not tried any over-the-counter medications. He states that pain is worsened with movement and improved with rest. He denies any other alleviating factors. He has been able to ambulate but notes that it makes the pain worse. He states that he "normally takes whatever his doctor gives him when he has back pain but states he has not got anything in a while." He denies any fever, history of back surgery, IV drug use, night sweats, weight loss, bowel or bladder incontinence, saddle anesthesia, numbness/weakness of his arms or legs.   The history is provided by the patient.    Past Medical History:  Diagnosis Date  . Chronic back pain   . Chronic hip pain   . Depression   . Diabetes mellitus   . Diverticulitis   . H/O kidney transplant   . Hypertension   . Kidney stone   . Renal disorder   . Urinary retention     Patient Active Problem List   Diagnosis Date Noted  . Chest pain 03/20/2016    Past Surgical History:  Procedure Laterality Date  . COLONOSCOPY  05/27/2003   [id number] terminal ileum,rectum, and colon  . HERNIA REPAIR    . HIP SURGERY    . KIDNEY TRANSPLANT    . NEPHRECTOMY TRANSPLANTED ORGAN         Home Medications    Prior to Admission medications   Medication Sig Start Date End Date Taking? Authorizing Provider  acetaminophen (TYLENOL) 325 MG tablet Take 2 tablets (650 mg total) by mouth every 6 (six) hours as needed. 05/27/16   Mesner, Corene Cornea, MD  atorvastatin (LIPITOR) 10 MG tablet Take 10 mg by mouth every  morning.     [provider]  ciprofloxacin (CIPRO) 500 MG tablet Take 1 tablet (500 mg total) by mouth 2 (two) times daily. 10/03/16   Rolland Porter, MD  cyclobenzaprine (FLEXERIL) 10 MG tablet Take 0.5 tablets (5 mg total) by mouth 2 (two) times daily as needed for muscle spasms. 10/22/16   Volanda Napoleon, PA-C  cycloSPORINE (SANDIMMUNE) 100 MG capsule Take 100 mg by mouth 2 (two) times daily. Take with 25 mg tablet to equal 125 mg twice daily    [provider]  cycloSPORINE (SANDIMMUNE) 25 MG capsule Take 25 mg by mouth 2 (two) times daily. Take with 100 mg tablet to equal dose of 125 mg twice daily    [provider]  esomeprazole (NEXIUM) 40 MG capsule Take 40 mg by mouth 2 (two) times daily.     [provider]  HYDROcodone-acetaminophen (NORCO/VICODIN) 5-325 MG tablet Take 1 tablet by mouth every 6 (six) hours as needed for moderate pain. 10/03/16   Rolland Porter, MD  methocarbamol (ROBAXIN) 500 MG tablet Take 1 tablet (500 mg total) by mouth 2 (two) times daily. Patient not taking: Reported on 07/25/2016 05/27/16   Mesner, Corene Cornea, MD  metroNIDAZOLE (FLAGYL) 500 MG tablet Take 1 tablet (500 mg total) by mouth 3 (three) times daily. 10/03/16  Rolland Porter, MD  ondansetron (ZOFRAN ODT) 4 MG disintegrating tablet 4mg  ODT q4 hours prn nausea/vomit 07/29/16   [name], MD  ondansetron (ZOFRAN) 4 MG tablet Take 1 tablet (4 mg total) by mouth every 8 (eight) hours as needed. 10/03/16   Rolland Porter, MD  predniSONE (DELTASONE) 5 MG tablet Take 5 mg by mouth every morning.     [provider]  promethazine (PHENERGAN) 25 MG tablet Take 25 mg by mouth every 6 (six) hours as needed for nausea or vomiting.    [provider]  tamsulosin (FLOMAX) 0.4 MG CAPS capsule Take 0.4 mg by mouth daily.     [provider]  vitamin B-12 (CYANOCOBALAMIN) 1000 MCG tablet Take 1,000 mcg by mouth daily.    [provider]    Family History Family  History  Problem Relation Age of Onset  . Hypertension Sister     Social History Social History  Substance Use Topics  . Smoking status: Former Smoker    Packs/day: 0.00    Years: 5.00    Quit date: 06/14/1972  . Smokeless tobacco: Never Used  . Alcohol use No     Allergies   Codeine; Enalapril maleate; Nsaids; Tape; and Vasotec   Review of Systems Review of Systems  Constitutional: Negative for fever.  Genitourinary: Negative for dysuria and hematuria.  Musculoskeletal: Positive for back pain. Negative for neck pain.  Neurological: Negative for weakness and numbness.  All other systems reviewed and are negative.    Physical Exam Updated Vital Signs BP 110/88 (BP Location: Right Arm)   Pulse 73   Temp 97.9 F (36.6 C) (Oral)   Resp (!) 21   Ht 5\' 11"  (1.803 m)   Wt 81.6 kg   SpO2 100%   BMI 25.10 kg/m   Physical Exam  Constitutional: He appears well-developed and well-nourished.  HENT:  Head: Normocephalic and atraumatic.  Eyes: Conjunctivae and EOM are normal. Right eye exhibits no discharge. Left eye exhibits no discharge. No scleral icterus.  Neck: Full passive range of motion without pain. Neck supple.  Flexion/extension and lateral movement of neck intact without difficulty.  Pulmonary/Chest: Effort normal.  Musculoskeletal: He exhibits no deformity.       Cervical back: He exhibits no tenderness.       Thoracic back: He exhibits no tenderness.       Lumbar back: He exhibits no tenderness.  No midline tenderness CT or L-spine. Right-sided paraspinal tenderness to the lumbar region.  Neurological: He is alert.  Cranial Nerves III-XII intact Follows commands, Moves all extremities  Poor effort throughout but symmetrical 5/5 strength to BUE and BLE  Sensation intact throughout  Normal heel to shin and finger to nose No slurred speech. No facial droop.   Skin: Skin is warm and dry.  Psychiatric: He has a normal mood and affect. His speech is normal and  behavior is normal.     ED Treatments / Results  Labs (all labs ordered are listed, but only abnormal results are displayed) Labs Reviewed - No data to display  EKG  EKG Interpretation None       Radiology No results found.  Procedures Procedures (including critical care time)  Medications Ordered in ED Medications  cyclobenzaprine (FLEXERIL) tablet 5 mg (5 mg Oral Given 10/22/16 0948)     Initial Impression / Assessment and Plan / ED Course  I have reviewed the triage vital signs and the nursing notes.  Pertinent labs & imaging results  that were available during my care of the patient were reviewed by me and considered in my medical decision making (see chart for details).     61 year old male with past medical history of degenerative disc disease, kidney transplant who presents with right lower back pain that began last night. Patient has a history of back pain related to his degenerative disc disease and states that today's episode is similar to past episodes of back pain. He denies any new symptoms. No new trauma or injury. No red flag symptoms. Physical exam with right-sided paraspinal tenderness. No midline tenderness to C, T or L-spine. Poor effort on neuro exam but no neurological deficits.. No bowel/bladder incontinence, no numbness/weakness of extremities, no saddle anesthesia. No fever, night sweats or weight loss. No history of cancer. No IVDA. Consider chronic back pain versus musculoskeletal strain. Will give renal dose of Flexeril here in the department help with pain. Patient is getting a ride home from a friend.  Patient looked up on the [place] substance monitoring program. He last received a 40 supply of Norco on 10/09/16. Discussed with patient. Explained that we will not be giving her narcotics since he just received a recent dose. Will plan to give him Flexeril to go home with. Last CMP done in April shows creatinine of 1.26. Will give renal dose of 5 mg of  Flexeril. Instructed patient to follow-up with his primary care doctor in 24-48 hours. Strict return precautions given. Patient asked versus understanding and agreement to plan.   Final Clinical Impressions(s) / ED Diagnoses   Final diagnoses:  Chronic right-sided low back pain, with sciatica presence unspecified    New Prescriptions New Prescriptions   CYCLOBENZAPRINE (FLEXERIL) 10 MG TABLET    Take 0.5 tablets (5 mg total) by mouth 2 (two) times daily as needed for muscle spasms.     Volanda Napoleon, PA-C 10/22/16 2134    Forde Dandy, MD 10/23/16 [phone]

## 2016-10-25 DIAGNOSIS — K5730 Diverticulosis of large intestine without perforation or abscess without bleeding: Secondary | ICD-10-CM | POA: Diagnosis not present

## 2016-10-25 DIAGNOSIS — I10 Essential (primary) hypertension: Secondary | ICD-10-CM | POA: Diagnosis not present

## 2016-10-26 ENCOUNTER — Other Ambulatory Visit: Payer: Self-pay

## 2016-10-26 NOTE — Patient Outreach (Signed)
[facility]) Care Management  10/26/2016  Robert Durham 1955-07-23 [id number]   Telephone call to patient for high ED Utilization screen.  No answer.  Unable to leave a message.  Plan: RN Health Coach will attempt patient again within 10 business days.    Jone Baseman, RN, MSN [place] [phone]

## 2016-10-28 ENCOUNTER — Other Ambulatory Visit: Payer: Self-pay

## 2016-10-28 NOTE — Patient Outreach (Signed)
[facility]) Care Management  10/28/2016  Robert Durham [birth year] [id number]   Telephone call to patient for ED Utilization referral.  Patient able to verify HIPAA.  He states he went to the emergency room because he pulled a muscle in his back. Patient denies any other health issues at this time and has declined need of services.  However, patient is agreeable to receive a letter and brochure for future reference.    Plan: RN Health Coach will send letter and brochure.   RN Health Coach will notify care management assistant of case status.  Jone Baseman, RN, MSN [place] [phone]

## 2016-11-12 DIAGNOSIS — I10 Essential (primary) hypertension: Secondary | ICD-10-CM | POA: Diagnosis not present

## 2016-11-15 ENCOUNTER — Encounter (HOSPITAL_COMMUNITY): Payer: Self-pay | Admitting: *Deleted

## 2016-11-15 ENCOUNTER — Emergency Department (HOSPITAL_COMMUNITY)
Admission: EM | Admit: 2016-11-15 | Discharge: 2016-11-15 | Disposition: A | Discharge status: Home / Self Care | Payer: Medicare Other | Attending: Emergency Medicine | Admitting: Emergency Medicine

## 2016-11-15 DIAGNOSIS — Z87891 Personal history of nicotine dependence: Secondary | ICD-10-CM | POA: Diagnosis not present

## 2016-11-15 DIAGNOSIS — S30861A Insect bite (nonvenomous) of abdominal wall, initial encounter: Secondary | ICD-10-CM | POA: Insufficient documentation

## 2016-11-15 DIAGNOSIS — Z79899 Other long term (current) drug therapy: Secondary | ICD-10-CM | POA: Insufficient documentation

## 2016-11-15 DIAGNOSIS — Z7984 Long term (current) use of oral hypoglycemic drugs: Secondary | ICD-10-CM | POA: Diagnosis not present

## 2016-11-15 DIAGNOSIS — Y939 Activity, unspecified: Secondary | ICD-10-CM | POA: Diagnosis not present

## 2016-11-15 DIAGNOSIS — E119 Type 2 diabetes mellitus without complications: Secondary | ICD-10-CM | POA: Diagnosis not present

## 2016-11-15 DIAGNOSIS — W57XXXA Bitten or stung by nonvenomous insect and other nonvenomous arthropods, initial encounter: Secondary | ICD-10-CM | POA: Diagnosis not present

## 2016-11-15 DIAGNOSIS — Y999 Unspecified external cause status: Secondary | ICD-10-CM | POA: Diagnosis not present

## 2016-11-15 DIAGNOSIS — I10 Essential (primary) hypertension: Secondary | ICD-10-CM | POA: Diagnosis not present

## 2016-11-15 DIAGNOSIS — Y929 Unspecified place or not applicable: Secondary | ICD-10-CM | POA: Insufficient documentation

## 2016-11-15 MED ORDER — DOXYCYCLINE HYCLATE 100 MG PO TABS
200.0000 mg | ORAL_TABLET | Freq: Once | ORAL | Status: AC
  Administered 2016-11-15: 200 mg via ORAL
  Filled 2016-11-15: qty 2

## 2016-11-15 NOTE — ED Triage Notes (Signed)
Pt states he had a tick bite him x 2 on right side of torso; pt brought in tick

## 2016-11-15 NOTE — ED Provider Notes (Signed)
[facility] DEPT Provider Note   CSN: [id number] Arrival date & time: 11/15/16  0056     History   Chief Complaint Chief Complaint  Patient presents with  . Tick Removal    HPI Robert Durham is a 61 y.o. male.  Patient presents to the ER for evaluation of tick bite on the right upper abdominal wall. Patient not sure how long the tick was on him. He presented to triage with a tick that was crawling on him, but he does have an area on the abdominal wall that appears consistent with tick bite. He reports he pulled off a tick from this region.       Past Medical History:  Diagnosis Date  . Chronic back pain   . Chronic hip pain   . Depression   . Diabetes mellitus   . Diverticulitis   . H/O kidney transplant   . Hypertension   . Kidney stone   . Renal disorder   . Urinary retention     Patient Active Problem List   Diagnosis Date Noted  . Chest pain 03/20/2016    Past Surgical History:  Procedure Laterality Date  . COLONOSCOPY  05/27/2003   [id number]:[id number] terminal ileum,rectum, and colon  . HERNIA REPAIR    . HIP SURGERY    . KIDNEY TRANSPLANT    . NEPHRECTOMY TRANSPLANTED ORGAN         Home Medications    Prior to Admission medications   Medication Sig Start Date End Date Taking? Authorizing Provider  acetaminophen (TYLENOL) 325 MG tablet Take 2 tablets (650 mg total) by mouth every 6 (six) hours as needed. 05/27/16   Mesner, Corene Cornea, MD  atorvastatin (LIPITOR) 10 MG tablet Take 10 mg by mouth every morning.     [provider]  ciprofloxacin (CIPRO) 500 MG tablet Take 1 tablet (500 mg total) by mouth 2 (two) times daily. 10/03/16   Rolland Porter, MD  cyclobenzaprine (FLEXERIL) 10 MG tablet Take 0.5 tablets (5 mg total) by mouth 2 (two) times daily as needed for muscle spasms. 10/22/16   Volanda Napoleon, PA-C  cycloSPORINE (SANDIMMUNE) 100 MG capsule Take 100 mg by mouth 2 (two) times daily. Take with 25 mg tablet to equal 125 mg twice daily     [provider]  cycloSPORINE (SANDIMMUNE) 25 MG capsule Take 25 mg by mouth 2 (two) times daily. Take with 100 mg tablet to equal dose of 125 mg twice daily    [provider]  esomeprazole (NEXIUM) 40 MG capsule Take 40 mg by mouth 2 (two) times daily.     [provider]  HYDROcodone-acetaminophen (NORCO/VICODIN) 5-325 MG tablet Take 1 tablet by mouth every 6 (six) hours as needed for moderate pain. 10/03/16   Rolland Porter, MD  methocarbamol (ROBAXIN) 500 MG tablet Take 1 tablet (500 mg total) by mouth 2 (two) times daily. Patient not taking: Reported on 07/25/2016 05/27/16   Mesner, Corene Cornea, MD  metroNIDAZOLE (FLAGYL) 500 MG tablet Take 1 tablet (500 mg total) by mouth 3 (three) times daily. 10/03/16   Rolland Porter, MD  ondansetron (ZOFRAN ODT) 4 MG disintegrating tablet 4mg  ODT q4 hours prn nausea/vomit 07/29/16   [name], MD  ondansetron (ZOFRAN) 4 MG tablet Take 1 tablet (4 mg total) by mouth every 8 (eight) hours as needed. 10/03/16   Rolland Porter, MD  predniSONE (DELTASONE) 5 MG tablet Take 5 mg by mouth every morning.     [provider]  promethazine (  PHENERGAN) 25 MG tablet Take 25 mg by mouth every 6 (six) hours as needed for nausea or vomiting.    [provider]  tamsulosin (FLOMAX) 0.4 MG CAPS capsule Take 0.4 mg by mouth daily.     [provider]  vitamin B-12 (CYANOCOBALAMIN) 1000 MCG tablet Take 1,000 mcg by mouth daily.    [provider]    Family History Family History  Problem Relation Age of Onset  . Hypertension Sister     Social History Social History  Substance Use Topics  . Smoking status: Former Smoker    Packs/day: 0.00    Years: 5.00    Quit date: 06/14/1972  . Smokeless tobacco: Never Used  . Alcohol use No     Allergies   Codeine; Enalapril maleate; Nsaids; Tape; and Vasotec   Review of Systems Review of Systems  Constitutional: Negative for fever.  All other systems reviewed and are  negative.    Physical Exam Updated Vital Signs BP 98/85 (BP Location: Left Arm)   Pulse 69   Temp 97.8 F (36.6 C) (Oral)   Resp 18   Ht 5\' 11"  (1.803 m)   Wt 81.6 kg (180 lb)   SpO2 98%   BMI 25.10 kg/m   Physical Exam  Constitutional: He is oriented to person, place, and time. He appears well-developed and well-nourished. No distress.  HENT:  Head: Normocephalic and atraumatic.  Right Ear: Hearing normal.  Left Ear: Hearing normal.  Nose: Nose normal.  Mouth/Throat: Oropharynx is clear and moist and mucous membranes are normal.  Eyes: Conjunctivae and EOM are normal. Pupils are equal, round, and reactive to light.  Neck: Normal range of motion. Neck supple.  Cardiovascular: Regular rhythm, S1 normal and S2 normal.  Exam reveals no gallop and no friction rub.   No murmur heard. Pulmonary/Chest: Effort normal and breath sounds normal. No respiratory distress. He exhibits no tenderness.  Abdominal: Soft. Normal appearance and bowel sounds are normal. There is no hepatosplenomegaly. There is no tenderness. There is no rebound, no guarding, no tenderness at McBurney's point and negative Murphy's sign. No hernia.  Musculoskeletal: Normal range of motion.  Neurological: He is alert and oriented to person, place, and time. He has normal strength. No cranial nerve deficit or sensory deficit. Coordination normal. GCS eye subscore is 4. GCS verbal subscore is 5. GCS motor subscore is 6.  Skin: Skin is warm, dry and intact. No rash noted. No cyanosis.  Slightly raised area on right upper abdomen with a small area of erythema surrounding  Psychiatric: He has a normal mood and affect. His speech is normal and behavior is normal. Thought content normal.  Nursing note and vitals reviewed.    ED Treatments / Results  Labs (all labs ordered are listed, but only abnormal results are displayed) Labs Reviewed - No data to display  EKG  EKG Interpretation None       Radiology No  results found.  Procedures Procedures (including critical care time)  Medications Ordered in ED Medications  doxycycline (VIBRA-TABS) tablet 200 mg (not administered)     Initial Impression / Assessment and Plan / ED Course  I have reviewed the triage vital signs and the nursing notes.  Pertinent labs & imaging results that were available during my care of the patient were reviewed by me and considered in my medical decision making (see chart for details).     There is slight erythema that would be expectedFrom a tick bite. This  does not resemble erythema chronicum migrans. No other rash, fever or symptoms. Patient is well-appearing.   Final Clinical Impressions(s) / ED Diagnoses   Final diagnoses:  Tick bite of abdominal wall, initial encounter    New Prescriptions New Prescriptions   No medications on file     Orpah Greek, MD 11/15/16 0120

## 2016-12-04 ENCOUNTER — Emergency Department (HOSPITAL_COMMUNITY)
Admission: EM | Admit: 2016-12-04 | Discharge: 2016-12-04 | Disposition: A | Discharge status: Home / Self Care | Payer: Medicare Other | Attending: Emergency Medicine | Admitting: Emergency Medicine

## 2016-12-04 ENCOUNTER — Emergency Department (HOSPITAL_COMMUNITY): Payer: Medicare Other

## 2016-12-04 ENCOUNTER — Encounter (HOSPITAL_COMMUNITY): Payer: Self-pay | Admitting: Emergency Medicine

## 2016-12-04 DIAGNOSIS — I10 Essential (primary) hypertension: Secondary | ICD-10-CM | POA: Diagnosis not present

## 2016-12-04 DIAGNOSIS — Z87891 Personal history of nicotine dependence: Secondary | ICD-10-CM | POA: Diagnosis not present

## 2016-12-04 DIAGNOSIS — R0789 Other chest pain: Secondary | ICD-10-CM

## 2016-12-04 DIAGNOSIS — E119 Type 2 diabetes mellitus without complications: Secondary | ICD-10-CM | POA: Insufficient documentation

## 2016-12-04 DIAGNOSIS — Z79899 Other long term (current) drug therapy: Secondary | ICD-10-CM | POA: Insufficient documentation

## 2016-12-04 DIAGNOSIS — R079 Chest pain, unspecified: Secondary | ICD-10-CM | POA: Diagnosis not present

## 2016-12-04 DIAGNOSIS — R0781 Pleurodynia: Secondary | ICD-10-CM | POA: Diagnosis present

## 2016-12-04 DIAGNOSIS — R0602 Shortness of breath: Secondary | ICD-10-CM | POA: Insufficient documentation

## 2016-12-04 LAB — CBC
HEMATOCRIT: 42.5 % (ref 39.0–52.0)
HEMOGLOBIN: 13.9 g/dL (ref 13.0–17.0)
MCH: 22.7 pg — ABNORMAL LOW (ref 26.0–34.0)
MCHC: 32.7 g/dL (ref 30.0–36.0)
MCV: 69.4 fL — ABNORMAL LOW (ref 78.0–100.0)
Platelets: ADEQUATE 10*3/uL (ref 150–400)
RBC: 6.12 MIL/uL — AB (ref 4.22–5.81)
RDW: 15.6 % — ABNORMAL HIGH (ref 11.5–15.5)
WBC: 8.3 10*3/uL (ref 4.0–10.5)

## 2016-12-04 LAB — COMPREHENSIVE METABOLIC PANEL
ALBUMIN: 3.9 g/dL (ref 3.5–5.0)
ALK PHOS: 71 U/L (ref 38–126)
ALT: 20 U/L (ref 17–63)
AST: 25 U/L (ref 15–41)
Anion gap: 7 (ref 5–15)
BILIRUBIN TOTAL: 0.7 mg/dL (ref 0.3–1.2)
BUN: 15 mg/dL (ref 6–20)
CALCIUM: 9.6 mg/dL (ref 8.9–10.3)
CO2: 28 mmol/L (ref 22–32)
Chloride: 105 mmol/L (ref 101–111)
Creatinine, Ser: 1.35 mg/dL — ABNORMAL HIGH (ref 0.61–1.24)
GFR calc Af Amer: >60 mL/min (ref >60)
GFR calc non Af Amer: 55 mL/min — ABNORMAL LOW (ref >60)
GLUCOSE: 93 mg/dL (ref 65–99)
Potassium: 4 mmol/L (ref 3.5–5.1)
Sodium: 140 mmol/L (ref 135–145)
TOTAL PROTEIN: 7.5 g/dL (ref 6.5–8.1)

## 2016-12-04 LAB — TROPONIN I: Troponin I: <0.03 ng/mL (ref <0.03)

## 2016-12-04 MED ORDER — ACETAMINOPHEN 500 MG PO TABS
1000.0000 mg | ORAL_TABLET | Freq: Once | ORAL | Status: AC
  Administered 2016-12-04: 1000 mg via ORAL
  Filled 2016-12-04: qty 2

## 2016-12-04 NOTE — Discharge Instructions (Signed)
Take Tylenol as directed for pain. Follow-up with Dr. Berdine Addison if you continue to have significant pain by next week. Return if concern for any reason

## 2016-12-04 NOTE — ED Notes (Signed)
Pt describes pain as sharp 9/10 Monitor NSR

## 2016-12-04 NOTE — ED Notes (Signed)
EKG given to Dr. Jacubowitz. 

## 2016-12-04 NOTE — ED Triage Notes (Signed)
PT c/o middle to left sharp chest pains x2 days with pain increasing upon inhalation and deep breathing.

## 2016-12-04 NOTE — ED Notes (Signed)
Rad at bedside.

## 2016-12-04 NOTE — ED Notes (Signed)
unable to sign as computer when trying to sign is for view only- pt verbalizes understandfing of DC instruction, med regime and follow up

## 2016-12-04 NOTE — ED Notes (Signed)
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

## 2016-12-04 NOTE — ED Provider Notes (Signed)
[facility] DEPT Provider Note   CSN: [id number] Arrival date & time: 12/04/16  1442     History   Chief Complaint Chief Complaint  Patient presents with  . Chest Pain    HPI Robert Durham is a 61 y.o. male.Plains of left anterior chest pain nonradiating, pleuritic in nature worse with deep inspiration onset 2 days ago, gradual in onset. No treatment prior to coming here. Pain is not made worse with exertion. Denies cough. Admits to mild dyspnea. No other associated symptoms. No treatment prior to coming here. Denies fever. Denies abdominal pain. Pain made worse with deep breathing and not improved with anything. Pain moderate at present.  HPI  Past Medical History:  Diagnosis Date  . Chronic back pain   . Chronic hip pain   . Depression   . Diabetes mellitus   . Diverticulitis   . H/O kidney transplant   . Hypertension   . Kidney stone   . Renal disorder   . Urinary retention     Patient Active Problem List   Diagnosis Date Noted  . Chest pain 03/20/2016    Past Surgical History:  Procedure Laterality Date  . COLONOSCOPY  05/27/2003   [id number] terminal ileum,rectum, and colon  . HERNIA REPAIR    . HIP SURGERY    . KIDNEY TRANSPLANT    . NEPHRECTOMY TRANSPLANTED ORGAN         Home Medications    Prior to Admission medications   Medication Sig Start Date End Date Taking? Authorizing Provider  acetaminophen (TYLENOL) 325 MG tablet Take 2 tablets (650 mg total) by mouth every 6 (six) hours as needed. 05/27/16   Mesner, Corene Cornea, MD  atorvastatin (LIPITOR) 10 MG tablet Take 10 mg by mouth every morning.     [provider]  ciprofloxacin (CIPRO) 500 MG tablet Take 1 tablet (500 mg total) by mouth 2 (two) times daily. 10/03/16   Rolland Porter, MD  cyclobenzaprine (FLEXERIL) 10 MG tablet Take 0.5 tablets (5 mg total) by mouth 2 (two) times daily as needed for muscle spasms. 10/22/16   Volanda Napoleon, PA-C  cycloSPORINE (SANDIMMUNE) 100 MG capsule Take  100 mg by mouth 2 (two) times daily. Take with 25 mg tablet to equal 125 mg twice daily    [provider]  cycloSPORINE (SANDIMMUNE) 25 MG capsule Take 25 mg by mouth 2 (two) times daily. Take with 100 mg tablet to equal dose of 125 mg twice daily    [provider]  esomeprazole (NEXIUM) 40 MG capsule Take 40 mg by mouth 2 (two) times daily.     [provider]  HYDROcodone-acetaminophen (NORCO/VICODIN) 5-325 MG tablet Take 1 tablet by mouth every 6 (six) hours as needed for moderate pain. 10/03/16   Rolland Porter, MD  methocarbamol (ROBAXIN) 500 MG tablet Take 1 tablet (500 mg total) by mouth 2 (two) times daily. Patient not taking: Reported on 07/25/2016 05/27/16   Mesner, Corene Cornea, MD  metroNIDAZOLE (FLAGYL) 500 MG tablet Take 1 tablet (500 mg total) by mouth 3 (three) times daily. 10/03/16   Rolland Porter, MD  ondansetron (ZOFRAN ODT) 4 MG disintegrating tablet 4mg  ODT q4 hours prn nausea/vomit 07/29/16   [name], MD  ondansetron (ZOFRAN) 4 MG tablet Take 1 tablet (4 mg total) by mouth every 8 (eight) hours as needed. 10/03/16   Rolland Porter, MD  predniSONE (DELTASONE) 5 MG tablet Take 5 mg by mouth every morning.     [provider]  promethazine (PHENERGAN) 25 MG tablet Take 25 mg by mouth every 6 (six) hours as needed for nausea or vomiting.    [provider]  tamsulosin (FLOMAX) 0.4 MG CAPS capsule Take 0.4 mg by mouth daily.     [provider]  vitamin B-12 (CYANOCOBALAMIN) 1000 MCG tablet Take 1,000 mcg by mouth daily.    [provider]    Family History Family History  Problem Relation Age of Onset  . Hypertension Sister     Social History Social History  Substance Use Topics  . Smoking status: Former Smoker    Packs/day: 0.00    Years: 5.00    Quit date: 06/14/1972  . Smokeless tobacco: Never Used  . Alcohol use No     Allergies   Codeine; Enalapril maleate; Nsaids; Tape; and Vasotec   Review of Systems Review  of Systems  Constitutional: Negative.   HENT: Negative.   Respiratory: Positive for shortness of breath.   Cardiovascular: Positive for chest pain.  Gastrointestinal: Negative.   Musculoskeletal: Negative.   Skin: Negative.   Allergic/Immunologic: Positive for immunocompromised state.  Neurological: Negative.   Psychiatric/Behavioral: Negative.   All other systems reviewed and are negative.    Physical Exam Updated Vital Signs BP 125/84 (BP Location: Left Arm)   Pulse 76   Temp 98.4 F (36.9 C) (Oral)   Resp 18   Ht 5\' 11"  (1.803 m)   Wt 81.6 kg (180 lb)   SpO2 99%   BMI 25.10 kg/m   Physical Exam  Constitutional: He is oriented to person, place, and time. He appears well-developed and well-nourished.  HENT:  Head: Normocephalic and atraumatic.  Eyes: Conjunctivae are normal. Pupils are equal, round, and reactive to light.  Neck: Neck supple. No tracheal deviation present. No thyromegaly present.  Cardiovascular: Normal rate, regular rhythm and normal heart sounds.   No murmur heard. Pulmonary/Chest: Effort normal and breath sounds normal. He exhibits tenderness.  Chest is tender at left side anteriorly, reproducing pain exactly  Abdominal: Soft. Bowel sounds are normal. He exhibits no distension. There is no tenderness.  Mass at left lower quadrant which is patient's renal transplant, nontender  Musculoskeletal: Normal range of motion. He exhibits no edema or tenderness.  Neurological: He is alert and oriented to person, place, and time. Coordination normal.  Skin: Skin is warm and dry. No rash noted.  Psychiatric: He has a normal mood and affect.  Nursing note and vitals reviewed.    ED Treatments / Results  Labs (all labs ordered are listed, but only abnormal results are displayed) Labs Reviewed  CBC  COMPREHENSIVE METABOLIC PANEL  TROPONIN I    EKG  EKG Interpretation  Date/Time:  Saturday December 04 2016 14:51:35 EDT Ventricular Rate:  71 PR  Interval:  150 QRS Duration: 82 QT Interval:  376 QTC Calculation: 408 R Axis:   -11 Text Interpretation:  Normal sinus rhythm Possible Left atrial enlargement Borderline ECG No significant change since last tracing Confirmed by Orlie Dakin [phone]) on 12/04/2016 3:24:04 PM       Radiology Dg Chest Port 1 View  Result Date: 12/04/2016 CLINICAL DATA:  Chest pain. EXAM: PORTABLE CHEST 1 VIEW COMPARISON:  07/29/2016 FINDINGS: Heart size and pulmonary vascularity are normal and the lungs are clear. Calcification in the arch of the aorta. No effusions. No acute bone abnormality. IMPRESSION: No acute abnormalities. Aortic atherosclerosis. Electronically Signed   By: Lorriane Shire M.D.   On: 12/04/2016 15:50    Procedures  Procedures (including critical care time)  Medications Ordered in ED Medications  acetaminophen (TYLENOL) tablet 1,000 mg (not administered)  Chest x-ray viewed by me Results for orders placed or performed during the hospital encounter of 12/04/16  CBC  Result Value Ref Range   WBC 8.3 4.0 - 10.5 K/uL   RBC 6.12 (H) 4.22 - 5.81 MIL/uL   Hemoglobin 13.9 13.0 - 17.0 g/dL   HCT 42.5 39.0 - 52.0 %   MCV 69.4 (L) 78.0 - 100.0 fL   MCH 22.7 (L) 26.0 - 34.0 pg   MCHC 32.7 30.0 - 36.0 g/dL   RDW 15.6 (H) 11.5 - 15.5 %   Platelets  150 - 400 K/uL    PLATELET CLUMPS NOTED ON SMEAR, COUNT APPEARS ADEQUATE  Comprehensive metabolic panel  Result Value Ref Range   Sodium 140 135 - 145 mmol/L   Potassium 4.0 3.5 - 5.1 mmol/L   Chloride 105 101 - 111 mmol/L   CO2 28 22 - 32 mmol/L   Glucose, Bld 93 65 - 99 mg/dL   BUN 15 6 - 20 mg/dL   Creatinine, Ser 1.35 (H) 0.61 - 1.24 mg/dL   Calcium 9.6 8.9 - 10.3 mg/dL   Total Protein 7.5 6.5 - 8.1 g/dL   Albumin 3.9 3.5 - 5.0 g/dL   AST 25 15 - 41 U/L   ALT 20 17 - 63 U/L   Alkaline Phosphatase 71 38 - 126 U/L   Total Bilirubin 0.7 0.3 - 1.2 mg/dL   GFR calc non Af Amer 55 (L) >60 mL/min   GFR calc Af Amer >60 >60 mL/min    Anion gap 7 5 - 15  Troponin I  Result Value Ref Range   Troponin I <0.03 <0.03 ng/mL   Dg Chest Port 1 View  Result Date: 12/04/2016 CLINICAL DATA:  Chest pain. EXAM: PORTABLE CHEST 1 VIEW COMPARISON:  07/29/2016 FINDINGS: Heart size and pulmonary vascularity are normal and the lungs are clear. Calcification in the arch of the aorta. No effusions. No acute bone abnormality. IMPRESSION: No acute abnormalities. Aortic atherosclerosis. Electronically Signed   By: Lorriane Shire M.D.   On: 12/04/2016 15:50     Initial Impression / Assessment and Plan / ED Course  I have reviewed the triage vital signs and the nursing notes.  Pertinent labs & imaging results that were available during my care of the patient were reviewed by me and considered in my medical decision making (see chart for details).     5:20 PM pain improved after treatment with Tylenol. Renal insufficiency is chronic. Story atypical for acute coronary syndrome. Doubt pulmonary embolism. Gradual onset pain. Exam and symptoms consistent with muscular skeletal chest pain. Heart score equals 3 An Tylenol for pain. Follow-up with Dr. Berdine Addison if continued pain by next week  Final Clinical Impressions(s) / ED Diagnoses  Diagnosis #1 atypical chest pain  #2 chronic renal insufficiency  Final diagnoses:  Chest pain    New Prescriptions New Prescriptions   No medications on file     Orlie Dakin, MD 12/04/16 1727

## 2016-12-06 ENCOUNTER — Other Ambulatory Visit: Payer: Self-pay

## 2016-12-06 NOTE — Patient Outreach (Signed)
[facility]) Care Management  12/06/2016  Robert Durham 17-May-1956 [id number]   Telephone call to patient for ED utilization screening. Patient reports he is doing ok just lying around.  Patient able to verify HIPAA.  Discussed with patient [place] [facility] Management services. Patient states he has no needs right now for services but agreed to receive letter and brochure.  Plan: RN Health Coach will send letter and brochure.   RN Health Coach will notify care management assistant of case status.  Jone Baseman, RN, MSN [place] [phone]

## 2016-12-12 DIAGNOSIS — I10 Essential (primary) hypertension: Secondary | ICD-10-CM | POA: Diagnosis not present

## 2016-12-23 DIAGNOSIS — Z471 Aftercare following joint replacement surgery: Secondary | ICD-10-CM | POA: Diagnosis not present

## 2016-12-23 DIAGNOSIS — Z96642 Presence of left artificial hip joint: Secondary | ICD-10-CM | POA: Diagnosis not present

## 2016-12-29 DIAGNOSIS — K5790 Diverticulosis of intestine, part unspecified, without perforation or abscess without bleeding: Secondary | ICD-10-CM | POA: Diagnosis not present

## 2016-12-29 DIAGNOSIS — I10 Essential (primary) hypertension: Secondary | ICD-10-CM | POA: Diagnosis not present

## 2016-12-29 DIAGNOSIS — Z940 Kidney transplant status: Secondary | ICD-10-CM | POA: Diagnosis not present

## 2017-01-09 ENCOUNTER — Encounter (HOSPITAL_COMMUNITY): Payer: Self-pay | Admitting: Emergency Medicine

## 2017-01-09 ENCOUNTER — Emergency Department (HOSPITAL_COMMUNITY)
Admission: EM | Admit: 2017-01-09 | Discharge: 2017-01-09 | Disposition: A | Discharge status: Home / Self Care | Payer: Medicare Other | Attending: Emergency Medicine | Admitting: Emergency Medicine

## 2017-01-09 DIAGNOSIS — Z87891 Personal history of nicotine dependence: Secondary | ICD-10-CM | POA: Diagnosis not present

## 2017-01-09 DIAGNOSIS — M791 Myalgia: Secondary | ICD-10-CM | POA: Diagnosis present

## 2017-01-09 DIAGNOSIS — E119 Type 2 diabetes mellitus without complications: Secondary | ICD-10-CM | POA: Diagnosis not present

## 2017-01-09 DIAGNOSIS — L0231 Cutaneous abscess of buttock: Secondary | ICD-10-CM | POA: Diagnosis not present

## 2017-01-09 DIAGNOSIS — I10 Essential (primary) hypertension: Secondary | ICD-10-CM | POA: Diagnosis not present

## 2017-01-09 DIAGNOSIS — Z79899 Other long term (current) drug therapy: Secondary | ICD-10-CM | POA: Insufficient documentation

## 2017-01-09 MED ORDER — POVIDONE-IODINE 10 % EX SOLN
CUTANEOUS | Status: AC
  Filled 2017-01-09: qty 15

## 2017-01-09 MED ORDER — CEPHALEXIN 500 MG PO CAPS: 500.0000 mg | ORAL_CAPSULE | Freq: Four times a day (QID) | ORAL | 0 refills | Status: DC

## 2017-01-09 MED ORDER — LIDOCAINE HCL (PF) 1 % IJ SOLN
INTRAMUSCULAR | Status: AC
  Filled 2017-01-09: qty 5

## 2017-01-09 NOTE — ED Provider Notes (Signed)
[facility] DEPT Provider Note   CSN: [id number] Arrival date & time: 01/09/17  1139     History   Chief Complaint Chief Complaint  Patient presents with  . Abscess    HPI Robert Durham is a 61 y.o. male.  HPI  Patient presents to ED for evaluation of abscess on left buttocks for the past 6 days. He reports pain associated with palpation. He denies any drainage or bleeding from the area. No previous history of abscesses in the past. Denies any, chills, nausea, vomiting, abdominal pain, history of diabetes or MRSA infection.  Past Medical History:  Diagnosis Date  . Chronic back pain   . Chronic hip pain   . Depression   . Diabetes mellitus   . Diverticulitis   . H/O kidney transplant   . Hypertension   . Kidney stone   . Renal disorder   . Urinary retention     Patient Active Problem List   Diagnosis Date Noted  . Chest pain 03/20/2016    Past Surgical History:  Procedure Laterality Date  . COLONOSCOPY  05/27/2003   [id number] terminal ileum,rectum, and colon  . HERNIA REPAIR    . HIP SURGERY    . KIDNEY TRANSPLANT    . NEPHRECTOMY TRANSPLANTED ORGAN         Home Medications    Prior to Admission medications   Medication Sig Start Date End Date Taking? Authorizing Provider  acetaminophen (TYLENOL) 325 MG tablet Take 2 tablets (650 mg total) by mouth every 6 (six) hours as needed. 05/27/16   Mesner, Corene Cornea, MD  atorvastatin (LIPITOR) 10 MG tablet Take 10 mg by mouth every morning.     [provider]  cephALEXin (KEFLEX) 500 MG capsule Take 1 capsule (500 mg total) by mouth 4 (four) times daily. 01/09/17   Gaylyn Berish, PA-C  ciprofloxacin (CIPRO) 500 MG tablet Take 1 tablet (500 mg total) by mouth 2 (two) times daily. Patient not taking: Reported on 12/04/2016 10/03/16   Rolland Porter, MD  cyclobenzaprine (FLEXERIL) 10 MG tablet Take 0.5 tablets (5 mg total) by mouth 2 (two) times daily as needed for muscle spasms. Patient not taking: Reported on  12/04/2016 10/22/16   Providence Lanius A, PA-C  cycloSPORINE (SANDIMMUNE) 100 MG capsule Take 100 mg by mouth 2 (two) times daily. Take with 25 mg tablet to equal 125 mg twice daily    [provider]  cycloSPORINE (SANDIMMUNE) 25 MG capsule Take 25 mg by mouth 2 (two) times daily. Take with 100 mg tablet to equal dose of 125 mg twice daily    [provider]  esomeprazole (NEXIUM) 40 MG capsule Take 40 mg by mouth 2 (two) times daily.     [provider]  HYDROcodone-acetaminophen (NORCO/VICODIN) 5-325 MG tablet Take 1 tablet by mouth every 6 (six) hours as needed for moderate pain. Patient not taking: Reported on 12/04/2016 10/03/16   Rolland Porter, MD  metroNIDAZOLE (FLAGYL) 500 MG tablet Take 1 tablet (500 mg total) by mouth 3 (three) times daily. Patient not taking: Reported on 12/04/2016 10/03/16   Rolland Porter, MD  ondansetron (ZOFRAN ODT) 4 MG disintegrating tablet 4mg  ODT q4 hours prn nausea/vomit Patient not taking: Reported on 12/04/2016 07/29/16   [name], MD  ondansetron (ZOFRAN) 4 MG tablet Take 1 tablet (4 mg total) by mouth every 8 (eight) hours as needed. 10/03/16   Rolland Porter, MD  predniSONE (DELTASONE) 5 MG tablet Take 5 mg by mouth every morning.  [provider]  promethazine (PHENERGAN) 25 MG tablet Take 25 mg by mouth every 6 (six) hours as needed for nausea or vomiting.    [provider]  tamsulosin (FLOMAX) 0.4 MG CAPS capsule Take 0.4 mg by mouth daily.     [provider]  vitamin B-12 (CYANOCOBALAMIN) 1000 MCG tablet Take 1,000 mcg by mouth daily.    [provider]    Family History Family History  Problem Relation Age of Onset  . Hypertension Sister     Social History Social History  Substance Use Topics  . Smoking status: Former Smoker    Packs/day: 0.00    Years: 5.00    Quit date: 06/14/1972  . Smokeless tobacco: Never Used  . Alcohol use No     Allergies   Codeine; Enalapril maleate; Nsaids;  Tape; and Vasotec   Review of Systems Review of Systems  Constitutional: Negative for chills and fever.  Gastrointestinal: Negative for nausea and vomiting.  Genitourinary: Negative for discharge and penile pain.  Skin: Positive for color change. Negative for rash.     Physical Exam Updated Vital Signs BP 119/87 (BP Location: Right Arm)   Pulse 73   Temp 98 F (36.7 C) (Oral)   Resp 18   Ht 5\' 11"  (1.803 m)   Wt 82.6 kg (182 lb)   SpO2 100%   BMI 25.38 kg/m   Physical Exam  Constitutional: He appears well-developed and well-nourished. No distress.  HENT:  Head: Normocephalic and atraumatic.  Eyes: Conjunctivae and EOM are normal. No scleral icterus.  Neck: Normal range of motion.  Pulmonary/Chest: Effort normal. No respiratory distress.  Neurological: He is alert.  Skin: No rash noted. He is not diaphoretic.  2 cm area of induration on the left buttock. No drainage or fluctuance noted. No overlying cellulitis. No temperature or color change noted. Sensation intact to light touch.  Psychiatric: He has a normal mood and affect.  Nursing note and vitals reviewed.    ED Treatments / Results  Labs (all labs ordered are listed, but only abnormal results are displayed) Labs Reviewed - No data to display  EKG  EKG Interpretation None       Radiology No results found.  Procedures .Marland KitchenIncision and Drainage Date/Time: 01/09/2017 1:01 PM Performed by: Delia Heady Authorized by: Delia Heady   Consent:    Consent obtained:  Verbal   Consent given by:  Patient   Risks discussed:  Bleeding, incomplete drainage, pain and infection Location:    Type:  Abscess   Location:  Lower extremity   Lower extremity location:  Buttock   Buttock location:  L buttock Pre-procedure details:    Skin preparation:  Antiseptic wash and Betadine Anesthesia (see MAR for exact dosages):    Anesthesia method:  Local infiltration   Local anesthetic:  Lidocaine 1% w/o epi Procedure  type:    Complexity:  Simple Procedure details:    Needle aspiration: no     Incision types:  Stab incision   Scalpel blade:  11   Wound management:  Irrigated with saline   Drainage:  Bloody   Drainage amount:  Scant   Packing materials:  None Post-procedure details:    Patient tolerance of procedure:  Tolerated well, no immediate complications   (including critical care time)  Medications Ordered in ED Medications  lidocaine (PF) (XYLOCAINE) 1 % injection (not administered)  povidone-iodine (BETADINE) 10 % external solution (not administered)     Initial Impression / Assessment  and Plan / ED Course  I have reviewed the triage vital signs and the nursing notes.  Pertinent labs & imaging results that were available during my care of the patient were reviewed by me and considered in my medical decision making (see chart for details).     Patient presents to ED for evaluation of abscess on left buttock for the past 6 days. Reports pain associated. Denies any previous history of recurrent abscesses, fevers, chills, MRSA infection, history of diabetes, nausea, vomiting or abdominal pain. There is a 2 cm area of induration on the left buttock skin. No overlying cellulitis, streaking, color or temperature change noted. No drainage or bleeding noted. Scant bloody discharge after I&D. Area was not large enough to warrant packing. We will place patient on Keflex and advised to follow-up with PCP if symptoms persist. Patient appears stable for discharge at this time. Strict return precautions given for severe or worsening symptoms or signs of infection.  Final Clinical Impressions(s) / ED Diagnoses   Final diagnoses:  Abscess of left buttock    New Prescriptions New Prescriptions   CEPHALEXIN (KEFLEX) 500 MG CAPSULE    Take 1 capsule (500 mg total) by mouth 4 (four) times daily.     Delia Heady, PA-C 01/09/17 1305    Isla Pence, MD 01/09/17 1354

## 2017-01-09 NOTE — ED Triage Notes (Signed)
Patient c/o abscess to left buttock. Patient states started Monday and is progressively getting worse. Unsure of any fevers. Denies any drainage.

## 2017-01-09 NOTE — ED Notes (Signed)
Pt has small abscess on left buttocks. Tender to palpation around area of abscess.

## 2017-01-09 NOTE — Discharge Instructions (Signed)
Please read attached information regarding your condition. Take Keflex 4 times daily for 5 days. Follow-up with PCP if symptoms persist. Return to ED for worsening pain, fevers, increased drainage, increased bleeding, abdominal pain or vomiting.

## 2017-01-10 ENCOUNTER — Other Ambulatory Visit: Payer: Self-pay

## 2017-01-10 NOTE — Patient Outreach (Signed)
[facility]) Care Management  01/10/2017  Robert Durham July 24, 1955 [id number]   Incoming return call from patient.  Patient abel to verify HIPAA.  Patient reports that he went to the emergency room as he though something had bitten him.  He states the area was lanced and he was given antibiotics.  Discussed with patient [place] and how we could be of service to him.  Patient declined services at this time.   Plan: RN Health Coach will send letter and brochure to patient. RN Health Coach will  Notify care management assistant of case status.  Jone Baseman, RN, MSN [place] [phone];

## 2017-01-10 NOTE — Patient Outreach (Signed)
[facility]) Care Management  01/10/2017  Robert Durham 1956-02-05 [id number]   Telephone call to patient for ED utilization screen. No answer.  Unable to leave a message.  Plan: RN Health Coach will attempt patient again in the next ten business days.    Jone Baseman, RN, MSN [place] [phone]

## 2017-01-12 DIAGNOSIS — I10 Essential (primary) hypertension: Secondary | ICD-10-CM | POA: Diagnosis not present

## 2017-02-12 DIAGNOSIS — I10 Essential (primary) hypertension: Secondary | ICD-10-CM | POA: Diagnosis not present

## 2017-02-15 ENCOUNTER — Emergency Department (HOSPITAL_COMMUNITY)
Admission: EM | Admit: 2017-02-15 | Discharge: 2017-02-15 | Disposition: A | Discharge status: Home Health | Payer: Medicare Other | Attending: Emergency Medicine | Admitting: Emergency Medicine

## 2017-02-15 ENCOUNTER — Encounter (HOSPITAL_COMMUNITY): Payer: Self-pay | Admitting: Emergency Medicine

## 2017-02-15 DIAGNOSIS — S39012A Strain of muscle, fascia and tendon of lower back, initial encounter: Secondary | ICD-10-CM | POA: Diagnosis not present

## 2017-02-15 DIAGNOSIS — M5442 Lumbago with sciatica, left side: Secondary | ICD-10-CM | POA: Diagnosis not present

## 2017-02-15 DIAGNOSIS — Z79899 Other long term (current) drug therapy: Secondary | ICD-10-CM | POA: Insufficient documentation

## 2017-02-15 DIAGNOSIS — G8929 Other chronic pain: Secondary | ICD-10-CM | POA: Diagnosis not present

## 2017-02-15 DIAGNOSIS — M62830 Muscle spasm of back: Secondary | ICD-10-CM | POA: Insufficient documentation

## 2017-02-15 DIAGNOSIS — Z87891 Personal history of nicotine dependence: Secondary | ICD-10-CM | POA: Insufficient documentation

## 2017-02-15 DIAGNOSIS — Y999 Unspecified external cause status: Secondary | ICD-10-CM | POA: Diagnosis not present

## 2017-02-15 DIAGNOSIS — X58XXXA Exposure to other specified factors, initial encounter: Secondary | ICD-10-CM | POA: Insufficient documentation

## 2017-02-15 DIAGNOSIS — I10 Essential (primary) hypertension: Secondary | ICD-10-CM | POA: Diagnosis not present

## 2017-02-15 DIAGNOSIS — E119 Type 2 diabetes mellitus without complications: Secondary | ICD-10-CM | POA: Insufficient documentation

## 2017-02-15 DIAGNOSIS — Y929 Unspecified place or not applicable: Secondary | ICD-10-CM | POA: Insufficient documentation

## 2017-02-15 DIAGNOSIS — Y939 Activity, unspecified: Secondary | ICD-10-CM | POA: Insufficient documentation

## 2017-02-15 DIAGNOSIS — T148XXA Other injury of unspecified body region, initial encounter: Secondary | ICD-10-CM

## 2017-02-15 DIAGNOSIS — S3992XA Unspecified injury of lower back, initial encounter: Secondary | ICD-10-CM | POA: Diagnosis present

## 2017-02-15 MED ORDER — CYCLOBENZAPRINE HCL 10 MG PO TABS
5.0000 mg | ORAL_TABLET | Freq: Once | ORAL | Status: AC
  Administered 2017-02-15: 5 mg via ORAL
  Filled 2017-02-15: qty 1

## 2017-02-15 MED ORDER — ONDANSETRON 4 MG PO TBDP
4.0000 mg | ORAL_TABLET | Freq: Once | ORAL | Status: AC
  Administered 2017-02-15: 4 mg via ORAL
  Filled 2017-02-15: qty 1

## 2017-02-15 MED ORDER — MORPHINE SULFATE (PF) 10 MG/ML IV SOLN
10.0000 mg | Freq: Once | INTRAVENOUS | Status: AC
  Administered 2017-02-15: 10 mg via INTRAMUSCULAR
  Filled 2017-02-15: qty 1

## 2017-02-15 MED ORDER — CYCLOBENZAPRINE HCL 5 MG PO TABS: 5.0000 mg | ORAL_TABLET | Freq: Three times a day (TID) | ORAL | 0 refills | Status: DC | PRN

## 2017-02-15 NOTE — Discharge Instructions (Signed)
Flexeril as needed for muscle pain and spasm. Since you have received a prescription for 40 Vicodin only 10 days ago from your primary care, we are unable to provide additional pain medication prescription to the emergency room. Please contact Dr. Berdine Addison for any additional refills.

## 2017-02-15 NOTE — ED Triage Notes (Signed)
Pt states that he began experiencing back pain on Friday that is in his lower back and extends down his leg.  Pt has a history of chronic back pain.

## 2017-02-15 NOTE — ED Provider Notes (Signed)
[facility] DEPT Provider Note   CSN: [id number] Arrival date & time: 02/15/17  0712     History   Chief Complaint Chief Complaint  Patient presents with  . Back Pain    HPI Robert Durham is a 61 y.o. male. Complaint is back pain, and left leg pain.  HPI Robert Durham is a 61 year old male with daily back pain. He follows with Dr. Iona Beard. He received 40 Vicodin prescription on 8/25. He states he has gone through these.  He describes pain in his left lower back, buttock, and to the top of the left leg posteriorly. No pain below the knee. No right-sided pain. No difficulty with emptying bowel or bladder. No incontinence. He states he is limping.  No fall, injury, or new trauma. He states he is on disability from a renal transplant.  Last MRI was 2013 which showed central disc bulge but no frank herniations.  He denies fever. No IV drug use. No history of cancer  Past Medical History:  Diagnosis Date  . Chronic back pain   . Chronic hip pain   . Depression   . Diabetes mellitus   . Diverticulitis   . H/O kidney transplant   . Hypertension   . Kidney stone   . Renal disorder   . Urinary retention     Patient Active Problem List   Diagnosis Date Noted  . Chest pain 03/20/2016    Past Surgical History:  Procedure Laterality Date  . COLONOSCOPY  05/27/2003   [id number] terminal ileum,rectum, and colon  . HERNIA REPAIR    . HIP SURGERY    . KIDNEY TRANSPLANT    . NEPHRECTOMY TRANSPLANTED ORGAN         Home Medications    Prior to Admission medications   Medication Sig Start Date End Date Taking? Authorizing Provider  acetaminophen (TYLENOL) 325 MG tablet Take 2 tablets (650 mg total) by mouth every 6 (six) hours as needed. 05/27/16   Mesner, Corene Cornea, MD  atorvastatin (LIPITOR) 10 MG tablet Take 10 mg by mouth every morning.     [provider]  cephALEXin (KEFLEX) 500 MG capsule Take 1 capsule (500 mg total) by mouth 4 (four) times daily. 01/09/17    Khatri, Hina, PA-C  ciprofloxacin (CIPRO) 500 MG tablet Take 1 tablet (500 mg total) by mouth 2 (two) times daily. Patient not taking: Reported on 12/04/2016 10/03/16   Rolland Porter, MD  cyclobenzaprine (FLEXERIL) 5 MG tablet Take 1 tablet (5 mg total) by mouth 3 (three) times daily as needed for muscle spasms. 02/15/17   Tanna Furry, MD  cycloSPORINE (SANDIMMUNE) 100 MG capsule Take 100 mg by mouth 2 (two) times daily. Take with 25 mg tablet to equal 125 mg twice daily    [provider]  cycloSPORINE (SANDIMMUNE) 25 MG capsule Take 25 mg by mouth 2 (two) times daily. Take with 100 mg tablet to equal dose of 125 mg twice daily    [provider]  esomeprazole (NEXIUM) 40 MG capsule Take 40 mg by mouth 2 (two) times daily.     [provider]  HYDROcodone-acetaminophen (NORCO/VICODIN) 5-325 MG tablet Take 1 tablet by mouth every 6 (six) hours as needed for moderate pain. Patient not taking: Reported on 12/04/2016 10/03/16   Rolland Porter, MD  metroNIDAZOLE (FLAGYL) 500 MG tablet Take 1 tablet (500 mg total) by mouth 3 (three) times daily. Patient not taking: Reported on 12/04/2016 10/03/16   Rolland Porter, MD  ondansetron [facility]  ODT) 4 MG disintegrating tablet 4mg  ODT q4 hours prn nausea/vomit Patient not taking: Reported on 12/04/2016 07/29/16   [name], MD  ondansetron (ZOFRAN) 4 MG tablet Take 1 tablet (4 mg total) by mouth every 8 (eight) hours as needed. 10/03/16   Rolland Porter, MD  predniSONE (DELTASONE) 5 MG tablet Take 5 mg by mouth every morning.     [provider]  promethazine (PHENERGAN) 25 MG tablet Take 25 mg by mouth every 6 (six) hours as needed for nausea or vomiting.    [provider]  tamsulosin (FLOMAX) 0.4 MG CAPS capsule Take 0.4 mg by mouth daily.     [provider]  vitamin B-12 (CYANOCOBALAMIN) 1000 MCG tablet Take 1,000 mcg by mouth daily.    [provider]    Family History Family History  Problem Relation Age of  Onset  . Hypertension Sister     Social History Social History  Substance Use Topics  . Smoking status: Former Smoker    Packs/day: 0.00    Years: 5.00    Quit date: 06/14/1972  . Smokeless tobacco: Never Used  . Alcohol use No     Allergies   Codeine; Enalapril maleate; Nsaids; Tape; and Vasotec   Review of Systems Review of Systems  Constitutional: Negative for appetite change, chills, diaphoresis, fatigue and fever.  HENT: Negative for mouth sores, sore throat and trouble swallowing.   Eyes: Negative for visual disturbance.  Respiratory: Negative for cough, chest tightness, shortness of breath and wheezing.   Cardiovascular: Negative for chest pain.  Gastrointestinal: Negative for abdominal distention, abdominal pain, diarrhea, nausea and vomiting.  Endocrine: Negative for polydipsia, polyphagia and polyuria.  Genitourinary: Negative for dysuria, frequency and hematuria.  Musculoskeletal: Positive for back pain and gait problem.  Skin: Negative for color change, pallor and rash.  Neurological: Negative for dizziness, syncope, light-headedness and headaches.  Hematological: Does not bruise/bleed easily.  Psychiatric/Behavioral: Negative for behavioral problems and confusion.     Physical Exam Updated Vital Signs BP 116/83 (BP Location: Right Arm)   Pulse 81   Temp 97.6 F (36.4 C) (Oral)   Resp (!) 22   Ht 5\' 11"  (1.803 m)   Wt 81.6 kg (180 lb)   SpO2 100%   BMI 25.10 kg/m   Physical Exam  Constitutional: He is oriented to person, place, and time. He appears well-developed and well-nourished. No distress.  HENT:  Head: Normocephalic.  Eyes: Pupils are equal, round, and reactive to light. Conjunctivae are normal. No scleral icterus.  Neck: Normal range of motion. Neck supple. No thyromegaly present.  Cardiovascular: Normal rate and regular rhythm.  Exam reveals no gallop and no friction rub.   No murmur heard. Pulmonary/Chest: Effort normal and breath sounds  normal. No respiratory distress. He has no wheezes. He has no rales.  Abdominal: Soft. Bowel sounds are normal. He exhibits no distension. There is no tenderness. There is no rebound.  Musculoskeletal: Normal range of motion.  Tenderness to palpation in the left lower back paraspinal musculature near the quadratus. Tenderness into the buttock, and sciatic notch.  Negative straight leg exam and crossed straight leg exam. These produce back pain but no radicular symptoms.  Neurological: He is alert and oriented to person, place, and time.  Exam limited by effort due to pain Normal symmetric strength to flex/.extend hip and knees, dorsi/plantar flex ankles. Normal symmetric sensation to all distributions to LEs Patellar and achilles reflexes 1-2+. Downgoing Babinski   Skin: Skin is  warm and dry. No rash noted.  Psychiatric: He has a normal mood and affect. His behavior is normal.     ED Treatments / Results  Labs (all labs ordered are listed, but only abnormal results are displayed) Labs Reviewed - No data to display  EKG  EKG Interpretation None       Radiology No results found.  Procedures Procedures (including critical care time)  Medications Ordered in ED Medications  Morphine Sulfate (PF) SOLN 10 mg (10 mg Intramuscular Given 02/15/17 0751)  ondansetron (ZOFRAN-ODT) disintegrating tablet 4 mg (4 mg Oral Given 02/15/17 0751)  cyclobenzaprine (FLEXERIL) tablet 5 mg (5 mg Oral Given 02/15/17 0751)     Initial Impression / Assessment and Plan / ED Course  I have reviewed the triage vital signs and the nursing notes.  Pertinent labs & imaging results that were available during my care of the patient were reviewed by me and considered in my medical decision making (see chart for details).     Low back pain. Muscular by exam and history. I had a discussion with him that I would not provide narcotics as he is already prescribed these on schedule monthly by his primary care  physician. This is via my review of the [place] controlled substance database. He will be prescribed Flexeril) renal dosing which has provided relief or him in the past. Was given pain medication in the emergency room.  Final Clinical Impressions(s) / ED Diagnoses   Final diagnoses:  Chronic left-sided low back pain with left-sided sciatica  Muscle strain  Muscle spasm of back    New Prescriptions New Prescriptions   CYCLOBENZAPRINE (FLEXERIL) 5 MG TABLET    Take 1 tablet (5 mg total) by mouth 3 (three) times daily as needed for muscle spasms.     Tanna Furry, MD 02/15/17 [phone]

## 2017-02-18 ENCOUNTER — Emergency Department (HOSPITAL_COMMUNITY): Payer: Medicare Other

## 2017-02-18 ENCOUNTER — Encounter (HOSPITAL_COMMUNITY): Payer: Self-pay | Admitting: Emergency Medicine

## 2017-02-18 ENCOUNTER — Emergency Department (HOSPITAL_COMMUNITY)
Admission: EM | Admit: 2017-02-18 | Discharge: 2017-02-18 | Disposition: A | Discharge status: Home / Self Care | Payer: Medicare Other | Attending: Emergency Medicine | Admitting: Emergency Medicine

## 2017-02-18 DIAGNOSIS — Z79899 Other long term (current) drug therapy: Secondary | ICD-10-CM | POA: Diagnosis not present

## 2017-02-18 DIAGNOSIS — G8929 Other chronic pain: Secondary | ICD-10-CM

## 2017-02-18 DIAGNOSIS — R7989 Other specified abnormal findings of blood chemistry: Secondary | ICD-10-CM | POA: Diagnosis not present

## 2017-02-18 DIAGNOSIS — R1111 Vomiting without nausea: Secondary | ICD-10-CM | POA: Diagnosis not present

## 2017-02-18 DIAGNOSIS — Z87891 Personal history of nicotine dependence: Secondary | ICD-10-CM | POA: Insufficient documentation

## 2017-02-18 DIAGNOSIS — R1084 Generalized abdominal pain: Secondary | ICD-10-CM | POA: Diagnosis not present

## 2017-02-18 DIAGNOSIS — R109 Unspecified abdominal pain: Secondary | ICD-10-CM | POA: Diagnosis not present

## 2017-02-18 DIAGNOSIS — K2970 Gastritis, unspecified, without bleeding: Secondary | ICD-10-CM | POA: Diagnosis not present

## 2017-02-18 DIAGNOSIS — R112 Nausea with vomiting, unspecified: Secondary | ICD-10-CM | POA: Diagnosis not present

## 2017-02-18 DIAGNOSIS — M545 Low back pain: Secondary | ICD-10-CM | POA: Insufficient documentation

## 2017-02-18 DIAGNOSIS — M546 Pain in thoracic spine: Secondary | ICD-10-CM | POA: Diagnosis not present

## 2017-02-18 DIAGNOSIS — I10 Essential (primary) hypertension: Secondary | ICD-10-CM | POA: Insufficient documentation

## 2017-02-18 DIAGNOSIS — M549 Dorsalgia, unspecified: Secondary | ICD-10-CM

## 2017-02-18 DIAGNOSIS — E119 Type 2 diabetes mellitus without complications: Secondary | ICD-10-CM | POA: Diagnosis not present

## 2017-02-18 DIAGNOSIS — K5730 Diverticulosis of large intestine without perforation or abscess without bleeding: Secondary | ICD-10-CM | POA: Diagnosis not present

## 2017-02-18 LAB — URINALYSIS, ROUTINE W REFLEX MICROSCOPIC
BILIRUBIN URINE: NEGATIVE
GLUCOSE, UA: NEGATIVE mg/dL
HGB URINE DIPSTICK: NEGATIVE
Ketones, ur: NEGATIVE mg/dL
Leukocytes, UA: NEGATIVE
Nitrite: NEGATIVE
PH: 6 (ref 5.0–8.0)
Protein, ur: NEGATIVE mg/dL
SPECIFIC GRAVITY, URINE: 1.021 (ref 1.005–1.030)

## 2017-02-18 LAB — COMPREHENSIVE METABOLIC PANEL
ALK PHOS: 53 U/L (ref 38–126)
ALT: 17 U/L (ref 17–63)
AST: 26 U/L (ref 15–41)
Albumin: 4.2 g/dL (ref 3.5–5.0)
Anion gap: 10 (ref 5–15)
BUN: 21 mg/dL — AB (ref 6–20)
CALCIUM: 10.1 mg/dL (ref 8.9–10.3)
CO2: 24 mmol/L (ref 22–32)
CREATININE: 1.66 mg/dL — AB (ref 0.61–1.24)
Chloride: 104 mmol/L (ref 101–111)
GFR, EST AFRICAN AMERICAN: 50 mL/min — AB (ref >60)
GFR, EST NON AFRICAN AMERICAN: 43 mL/min — AB (ref >60)
Glucose, Bld: 79 mg/dL (ref 65–99)
Potassium: 3.7 mmol/L (ref 3.5–5.1)
SODIUM: 138 mmol/L (ref 135–145)
Total Bilirubin: 0.8 mg/dL (ref 0.3–1.2)
Total Protein: 8 g/dL (ref 6.5–8.1)

## 2017-02-18 LAB — CBC
HCT: 45.3 % (ref 39.0–52.0)
Hemoglobin: 15 g/dL (ref 13.0–17.0)
MCH: 22.6 pg — AB (ref 26.0–34.0)
MCHC: 33.1 g/dL (ref 30.0–36.0)
MCV: 68.1 fL — ABNORMAL LOW (ref 78.0–100.0)
Platelets: 177 10*3/uL (ref 150–400)
RBC: 6.65 MIL/uL — AB (ref 4.22–5.81)
RDW: 15.4 % (ref 11.5–15.5)
WBC: 7.3 10*3/uL (ref 4.0–10.5)

## 2017-02-18 LAB — DIFFERENTIAL
Basophils Absolute: 0 10*3/uL (ref 0.0–0.1)
Basophils Relative: 0 %
EOS ABS: 0.2 10*3/uL (ref 0.0–0.7)
EOS PCT: 2 %
LYMPHS ABS: 2.5 10*3/uL (ref 0.7–4.0)
LYMPHS PCT: 32 %
MONOS PCT: 8 %
Monocytes Absolute: 0.6 10*3/uL (ref 0.1–1.0)
Neutro Abs: 4.5 10*3/uL (ref 1.7–7.7)
Neutrophils Relative %: 58 %

## 2017-02-18 LAB — LIPASE, BLOOD: Lipase: 37 U/L (ref 11–51)

## 2017-02-18 MED ORDER — SODIUM CHLORIDE 0.9 % IV BOLUS (SEPSIS)
500.0000 mL | Freq: Once | INTRAVENOUS | Status: AC
  Administered 2017-02-18: 500 mL via INTRAVENOUS

## 2017-02-18 MED ORDER — SODIUM CHLORIDE 0.9 % IV SOLN
INTRAVENOUS | Status: DC
  Administered 2017-02-18: 14:00:00 via INTRAVENOUS

## 2017-02-18 MED ORDER — ONDANSETRON HCL 4 MG/2ML IJ SOLN
4.0000 mg | INTRAMUSCULAR | Status: DC | PRN
  Administered 2017-02-18: 4 mg via INTRAVENOUS
  Filled 2017-02-18: qty 2

## 2017-02-18 MED ORDER — MORPHINE SULFATE (PF) 4 MG/ML IV SOLN
4.0000 mg | INTRAVENOUS | Status: DC | PRN
  Administered 2017-02-18: 4 mg via INTRAVENOUS
  Filled 2017-02-18: qty 1

## 2017-02-18 MED ORDER — IOPAMIDOL (ISOVUE-300) INJECTION 61%
INTRAVENOUS | Status: AC
  Filled 2017-02-18: qty 30

## 2017-02-18 NOTE — ED Notes (Signed)
Pt to CT

## 2017-02-18 NOTE — ED Triage Notes (Signed)
Pt c/o lower abd pain radiating to lower back and legs with n/v.

## 2017-02-18 NOTE — ED Provider Notes (Signed)
[facility] DEPT Provider Note   CSN: [id number] Arrival date & time: 02/18/17  1233     History   Chief Complaint Chief Complaint  Patient presents with  . Abdominal Pain    HPI Robert Durham is a 61 y.o. male.  HPI  Pt was seen at 1310.  Per pt, c/o gradual onset and persistence of constant generalized abd "pain" for the past 3 days, worse since yesterday.  Has been associated with multiple intermittent episodes of N/V.  Describes the abd pain as "aching."  Pt was evaluated in the ED 3 days ago for chronic back pain, and is insistent that he is not here for LBP but for abd pain. States he is "taking the muscle relaxer and taking tylenol and it's not helping the pain."  Denies diarrhea, no fevers, no change in his chronic back pain, no rash, no CP/SOB, no black or blood in stools or emesis, no focal motor weakness, no tingling/numbness in extremities, no saddle anesthesia, no incont/retention of bowel or bladder, no testicular pain/swelling, no dysuria/hematuria.  .      Past Medical History:  Diagnosis Date  . Chronic back pain   . Chronic hip pain   . Depression   . Diabetes mellitus   . Diverticulitis   . H/O kidney transplant   . Hypertension   . Kidney stone   . Renal disorder   . Urinary retention     Patient Active Problem List   Diagnosis Date Noted  . Chest pain 03/20/2016    Past Surgical History:  Procedure Laterality Date  . COLONOSCOPY  05/27/2003   AST:MHDQQI terminal ileum,rectum, and colon  . HERNIA REPAIR    . HIP SURGERY    . KIDNEY TRANSPLANT    . NEPHRECTOMY TRANSPLANTED ORGAN         Home Medications    Prior to Admission medications   Medication Sig Start Date End Date Taking? Authorizing Provider  acetaminophen (TYLENOL) 325 MG tablet Take 2 tablets (650 mg total) by mouth every 6 (six) hours as needed. 05/27/16   Mesner, Corene Cornea, MD  atorvastatin (LIPITOR) 10 MG tablet Take 10 mg by mouth every morning.     [provider]  cephALEXin (KEFLEX) 500 MG capsule Take 1 capsule (500 mg total) by mouth 4 (four) times daily. 01/09/17   Khatri, Hina, PA-C  ciprofloxacin (CIPRO) 500 MG tablet Take 1 tablet (500 mg total) by mouth 2 (two) times daily. Patient not taking: Reported on 12/04/2016 10/03/16   Rolland Porter, MD  cyclobenzaprine (FLEXERIL) 5 MG tablet Take 1 tablet (5 mg total) by mouth 3 (three) times daily as needed for muscle spasms. 02/15/17   Tanna Furry, MD  cycloSPORINE (SANDIMMUNE) 100 MG capsule Take 100 mg by mouth 2 (two) times daily. Take with 25 mg tablet to equal 125 mg twice daily    [provider]  cycloSPORINE (SANDIMMUNE) 25 MG capsule Take 25 mg by mouth 2 (two) times daily. Take with 100 mg tablet to equal dose of 125 mg twice daily    [provider]  esomeprazole (NEXIUM) 40 MG capsule Take 40 mg by mouth 2 (two) times daily.     [provider]  HYDROcodone-acetaminophen (NORCO/VICODIN) 5-325 MG tablet Take 1 tablet by mouth every 6 (six) hours as needed for moderate pain. Patient not taking: Reported on 12/04/2016 10/03/16   Rolland Porter, MD  metroNIDAZOLE (FLAGYL) 500 MG tablet Take 1 tablet (500 mg total) by mouth 3 (  three) times daily. Patient not taking: Reported on 12/04/2016 10/03/16   Rolland Porter, MD  ondansetron (ZOFRAN ODT) 4 MG disintegrating tablet 4mg  ODT q4 hours prn nausea/vomit Patient not taking: Reported on 12/04/2016 07/29/16   [name], MD  ondansetron (ZOFRAN) 4 MG tablet Take 1 tablet (4 mg total) by mouth every 8 (eight) hours as needed. 10/03/16   Rolland Porter, MD  predniSONE (DELTASONE) 5 MG tablet Take 5 mg by mouth every morning.     [provider]  promethazine (PHENERGAN) 25 MG tablet Take 25 mg by mouth every 6 (six) hours as needed for nausea or vomiting.    [provider]  tamsulosin (FLOMAX) 0.4 MG CAPS capsule Take 0.4 mg by mouth daily.     [provider]  vitamin B-12 (CYANOCOBALAMIN) 1000 MCG tablet Take 1,000  mcg by mouth daily.    [provider]    Family History Family History  Problem Relation Age of Onset  . Hypertension Sister     Social History Social History  Substance Use Topics  . Smoking status: Former Smoker    Packs/day: 0.00    Years: 5.00    Quit date: 06/14/1972  . Smokeless tobacco: Never Used  . Alcohol use No     Allergies   Codeine; Enalapril maleate; Nsaids; Tape; and Vasotec   Review of Systems Review of Systems ROS: Statement: All systems negative except as marked or noted in the HPI; Constitutional: Negative for fever and chills. ; ; Eyes: Negative for eye pain, redness and discharge. ; ; ENMT: Negative for ear pain, hoarseness, nasal congestion, sinus pressure and sore throat. ; ; Cardiovascular: Negative for chest pain, palpitations, diaphoresis, dyspnea and peripheral edema. ; ; Respiratory: Negative for cough, wheezing and stridor. ; ; Gastrointestinal: +N/V, abd pain. Negative for diarrhea, blood in stool, hematemesis, jaundice and rectal bleeding. . ; ; Genitourinary: Negative for dysuria, flank pain and hematuria. ; ; Genital:  No penile drainage or rash, no testicular pain or swelling, no scrotal rash or swelling. ;; Musculoskeletal: +chronic LBP. Negative for neck pain. Negative for swelling and trauma.; ; Skin: Negative for pruritus, rash, abrasions, blisters, bruising and skin lesion.; ; Neuro: Negative for headache, lightheadedness and neck stiffness. Negative for weakness, altered level of consciousness, altered mental status, extremity weakness, paresthesias, involuntary movement, seizure and syncope.       Physical Exam Updated Vital Signs BP 97/75   Pulse 87   Temp (!) 97.5 F (36.4 C)   Resp 18   Ht 5\' 11"  (1.803 m)   Wt 81.6 kg (180 lb)   SpO2 100%   BMI 25.10 kg/m   Physical Exam 1315: Physical examination:  Nursing notes reviewed; Vital signs and O2 SAT reviewed;  Constitutional: Well developed, Well nourished, Well  hydrated, Uncomfortable appearing; Head:  Normocephalic, atraumatic; Eyes: EOMI, PERRL, No scleral icterus; ENMT: Mouth and pharynx normal, Mucous membranes moist; Neck: Supple, Full range of motion, No lymphadenopathy; Cardiovascular: Regular rate and rhythm, No gallop; Respiratory: Breath sounds clear & equal bilaterally, No wheezes.  Speaking full sentences with ease, Normal respiratory effort/excursion; Chest: Nontender, Movement normal; Abdomen: Soft, +diffuse tenderness to palp. No rebound or guarding. Nondistended, Normal bowel sounds; Genitourinary: No CVA tenderness; Spine:  No midline CS, TS, LS tenderness. +TTP bilat lumbar paraspinal muscles.;; Extremities: Pulses normal, No tenderness, No edema, No calf edema or asymmetry.; Neuro: AA&Ox3, Major CN grossly intact.  Speech clear. No gross focal motor or sensory deficits in extremities.; Skin:  Color normal, Warm, Dry.   ED Treatments / Results  Labs (all labs ordered are listed, but only abnormal results are displayed)   EKG  EKG Interpretation None       Radiology   Procedures Procedures (including critical care time)  Medications Ordered in ED Medications  morphine 4 MG/ML injection 4 mg (4 mg Intravenous Given 02/18/17 1330)  ondansetron (ZOFRAN) injection 4 mg (4 mg Intravenous Given 02/18/17 1330)  0.9 %  sodium chloride infusion ( Intravenous New Bag/Given 02/18/17 1335)  iopamidol (ISOVUE-300) 61 % injection (not administered)  sodium chloride 0.9 % bolus 500 mL (500 mLs Intravenous New Bag/Given 02/18/17 1329)     Initial Impression / Assessment and Plan / ED Course  I have reviewed the triage vital signs and the nursing notes.  Pertinent labs & imaging results that were available during my care of the patient were reviewed by me and considered in my medical decision making (see chart for details).  MDM Reviewed: previous chart, nursing note and vitals Reviewed previous: labs Interpretation: labs and CT  scan    Results for orders placed or performed during the hospital encounter of 02/18/17  Lipase, blood  Result Value Ref Range   Lipase 37 11 - 51 U/L  Comprehensive metabolic panel  Result Value Ref Range   Sodium 138 135 - 145 mmol/L   Potassium 3.7 3.5 - 5.1 mmol/L   Chloride 104 101 - 111 mmol/L   CO2 24 22 - 32 mmol/L   Glucose, Bld 79 65 - 99 mg/dL   BUN 21 (H) 6 - 20 mg/dL   Creatinine, Ser 1.66 (H) 0.61 - 1.24 mg/dL   Calcium 10.1 8.9 - 10.3 mg/dL   Total Protein 8.0 6.5 - 8.1 g/dL   Albumin 4.2 3.5 - 5.0 g/dL   AST 26 15 - 41 U/L   ALT 17 17 - 63 U/L   Alkaline Phosphatase 53 38 - 126 U/L   Total Bilirubin 0.8 0.3 - 1.2 mg/dL   GFR calc non Af Amer 43 (L) >60 mL/min   GFR calc Af Amer 50 (L) >60 mL/min   Anion gap 10 5 - 15  CBC  Result Value Ref Range   WBC 7.3 4.0 - 10.5 K/uL   RBC 6.65 (H) 4.22 - 5.81 MIL/uL   Hemoglobin 15.0 13.0 - 17.0 g/dL   HCT 45.3 39.0 - 52.0 %   MCV 68.1 (L) 78.0 - 100.0 fL   MCH 22.6 (L) 26.0 - 34.0 pg   MCHC 33.1 30.0 - 36.0 g/dL   RDW 15.4 11.5 - 15.5 %   Platelets 177 150 - 400 K/uL  Urinalysis, Routine w reflex microscopic  Result Value Ref Range   Color, Urine YELLOW YELLOW   APPearance HAZY (A) CLEAR   Specific Gravity, Urine 1.021 1.005 - 1.030   pH 6.0 5.0 - 8.0   Glucose, UA NEGATIVE NEGATIVE mg/dL   Hgb urine dipstick NEGATIVE NEGATIVE   Bilirubin Urine NEGATIVE NEGATIVE   Ketones, ur NEGATIVE NEGATIVE mg/dL   Protein, ur NEGATIVE NEGATIVE mg/dL   Nitrite NEGATIVE NEGATIVE   Leukocytes, UA NEGATIVE NEGATIVE  Differential  Result Value Ref Range   Neutrophils Relative % 58 %   Neutro Abs 4.5 1.7 - 7.7 K/uL   Lymphocytes Relative 32 %   Lymphs Abs 2.5 0.7 - 4.0 K/uL   Monocytes Relative 8 %   Monocytes Absolute 0.6 0.1 - 1.0 K/uL   Eosinophils Relative 2 %  Eosinophils Absolute 0.2 0.0 - 0.7 K/uL   Basophils Relative 0 %   Basophils Absolute 0.0 0.0 - 0.1 K/uL   Ct Abdomen Pelvis Wo Contrast Result Date:  02/18/2017 CLINICAL DATA:  Low back pain radiating to the lower back and legs. Nausea and vomiting. EXAM: CT ABDOMEN AND PELVIS WITHOUT CONTRAST TECHNIQUE: Multidetector CT imaging of the abdomen and pelvis was performed following the standard protocol without IV contrast. COMPARISON:  10/03/2016. FINDINGS: Lower chest: Mild bilateral dependent atelectasis. Hepatobiliary: Stable small liver cysts. Normal appearing gallbladder. Pancreas: Unremarkable. No pancreatic ductal dilatation or surrounding inflammatory changes. Spleen: Normal in size without focal abnormality. Adrenals/Urinary Tract: Stable small bilateral native kidneys. Stable left pelvic transplant kidney without hydronephrosis. Unremarkable urinary bladder and adrenal glands. Stomach/Bowel: Scattered colonic diverticula without evidence of diverticulitis. Normal appearing appendix, small bowel and stomach. Vascular/Lymphatic: Atheromatous arterial calcifications without aneurysm. No enlarged lymph nodes. Reproductive: Minimally enlarged prostate gland containing a small amount coarse calcification. Other: Small right inguinal hernia containing fat. Musculoskeletal: Left total hip prosthesis. Lumbar and lower thoracic spine degenerative changes. IMPRESSION: 1. No acute abnormality. 2. Stable normal appearing left pelvic transplant kidney and markedly atrophied native kidneys. 3. Extensive colonic diverticulosis without evidence diverticulitis. Electronically Signed   By: Claudie Revering M.D.   On: 02/18/2017 16:58   Results for Robert Durham, Robert Durham (MRN [id number]) as of 02/18/2017 17:15  Ref. Range 05/27/2016 10:31 07/29/2016 10:03 10/03/2016 04:20 12/04/2016 15:22 02/18/2017 12:56  BUN Latest Ref Range: 6 - 20 mg/dL 21 (H) 20 18 15 21  (H)  Creatinine Latest Ref Range: 0.61 - 1.24 mg/dL 1.30 (H) 1.36 (H) 1.26 (H) 1.35 (H) 1.66 (H)     1700:  Cr elevated from baseline. Judicious IVF given. Workup otherwise reassuring. [place] Controlled Substance Database accessed:   Pt filled hydrocodone 5/APAP 325, #40, on 02/05/2017 (40 day supply), rx by Dr. Iona Beard.   Pt aware he will not receive any further narcotics in the ED today or by prescription.   1730:  BUN/Cr mildly elevated from baseline; workup otherwise reassuring. T/C to [facility] Dr. Otilio Miu, case discussed, including:  HPI, pertinent PM/SHx, VS/PE, dx testing, ED course and treatment:  Pt already has f/u appt on Tuesday, she will re-check labs then, have pt continue usual meds. Dx and testing, as well as d/w Renal MD, d/w pt and family.  Questions answered.  Verb understanding, agreeable to d/c home with outpt f/u.   Final Clinical Impressions(s) / ED Diagnoses   Final diagnoses:  None    New Prescriptions New Prescriptions   No medications on file     Francine Graven, DO 02/21/17 1552

## 2017-02-18 NOTE — Discharge Instructions (Signed)
Take usual prescriptions as previously directed.  Apply moist heat or ice to the area(s) of discomfort, for 15 minutes at a time, several times per day for the next few days.  Do not fall asleep on a heating or ice pack.  Call your regular medical doctor on Monday to schedule a follow up appointment next week. Call your Renal doctor at [facility] on Monday to confirm your previously scheduled appointment for Tuesday, where you will have your renal function tests rechecked (they were mildly elevated today).  Return to the Emergency Department immediately if worsening.

## 2017-02-22 DIAGNOSIS — Z23 Encounter for immunization: Secondary | ICD-10-CM | POA: Diagnosis not present

## 2017-02-22 DIAGNOSIS — D509 Iron deficiency anemia, unspecified: Secondary | ICD-10-CM | POA: Diagnosis not present

## 2017-02-22 DIAGNOSIS — I10 Essential (primary) hypertension: Secondary | ICD-10-CM | POA: Diagnosis not present

## 2017-02-22 DIAGNOSIS — Z940 Kidney transplant status: Secondary | ICD-10-CM | POA: Diagnosis not present

## 2017-02-22 DIAGNOSIS — N186 End stage renal disease: Secondary | ICD-10-CM | POA: Diagnosis not present

## 2017-02-22 DIAGNOSIS — Z4822 Encounter for aftercare following kidney transplant: Secondary | ICD-10-CM | POA: Diagnosis not present

## 2017-02-22 DIAGNOSIS — D899 Disorder involving the immune mechanism, unspecified: Secondary | ICD-10-CM | POA: Diagnosis not present

## 2017-02-22 DIAGNOSIS — Z9889 Other specified postprocedural states: Secondary | ICD-10-CM | POA: Diagnosis not present

## 2017-02-22 DIAGNOSIS — Z79899 Other long term (current) drug therapy: Secondary | ICD-10-CM | POA: Diagnosis not present

## 2017-02-28 DIAGNOSIS — I10 Essential (primary) hypertension: Secondary | ICD-10-CM | POA: Diagnosis not present

## 2017-02-28 DIAGNOSIS — Z940 Kidney transplant status: Secondary | ICD-10-CM | POA: Diagnosis not present

## 2017-03-14 DIAGNOSIS — I10 Essential (primary) hypertension: Secondary | ICD-10-CM | POA: Diagnosis not present

## 2017-04-14 DIAGNOSIS — I10 Essential (primary) hypertension: Secondary | ICD-10-CM | POA: Diagnosis not present

## 2017-05-04 ENCOUNTER — Encounter (HOSPITAL_COMMUNITY): Payer: Self-pay | Admitting: *Deleted

## 2017-05-04 ENCOUNTER — Emergency Department (HOSPITAL_COMMUNITY)
Admission: EM | Admit: 2017-05-04 | Discharge: 2017-05-04 | Disposition: A | Discharge status: Home / Self Care | Payer: Medicare Other | Attending: Emergency Medicine | Admitting: Emergency Medicine

## 2017-05-04 DIAGNOSIS — Y998 Other external cause status: Secondary | ICD-10-CM | POA: Insufficient documentation

## 2017-05-04 DIAGNOSIS — X58XXXA Exposure to other specified factors, initial encounter: Secondary | ICD-10-CM | POA: Diagnosis not present

## 2017-05-04 DIAGNOSIS — S40251A Superficial foreign body of right shoulder, initial encounter: Secondary | ICD-10-CM | POA: Diagnosis not present

## 2017-05-04 DIAGNOSIS — S40261A Insect bite (nonvenomous) of right shoulder, initial encounter: Secondary | ICD-10-CM | POA: Diagnosis not present

## 2017-05-04 DIAGNOSIS — F329 Major depressive disorder, single episode, unspecified: Secondary | ICD-10-CM | POA: Insufficient documentation

## 2017-05-04 DIAGNOSIS — Y929 Unspecified place or not applicable: Secondary | ICD-10-CM | POA: Insufficient documentation

## 2017-05-04 DIAGNOSIS — Z940 Kidney transplant status: Secondary | ICD-10-CM | POA: Insufficient documentation

## 2017-05-04 DIAGNOSIS — I10 Essential (primary) hypertension: Secondary | ICD-10-CM | POA: Insufficient documentation

## 2017-05-04 DIAGNOSIS — E119 Type 2 diabetes mellitus without complications: Secondary | ICD-10-CM | POA: Diagnosis not present

## 2017-05-04 DIAGNOSIS — Y9389 Activity, other specified: Secondary | ICD-10-CM | POA: Insufficient documentation

## 2017-05-04 DIAGNOSIS — Z79899 Other long term (current) drug therapy: Secondary | ICD-10-CM | POA: Diagnosis not present

## 2017-05-04 DIAGNOSIS — Z87891 Personal history of nicotine dependence: Secondary | ICD-10-CM | POA: Insufficient documentation

## 2017-05-04 DIAGNOSIS — W57XXXA Bitten or stung by nonvenomous insect and other nonvenomous arthropods, initial encounter: Secondary | ICD-10-CM

## 2017-05-04 NOTE — Discharge Instructions (Signed)
Recheck if you get a fever, rash, body aches, increased redness or swelling around the bite site.

## 2017-05-04 NOTE — ED Triage Notes (Signed)
Pt noticed a tick in his right shoulder just PTA. Denies any further symptoms

## 2017-05-04 NOTE — ED Provider Notes (Signed)
[facility] EMERGENCY DEPARTMENT Provider Note   CSN: [id number] Arrival date & time: 05/04/17  0410  Time seen 05:05 AM   History   Chief Complaint Chief Complaint  Patient presents with  . Tick Removal    HPI Robert Durham is a 61 y.o. male.  HPI patient states he woke up a few minutes prior to arrival and noticed he had a tick on his right shoulder.  He states he tried to remove it without success.  He states he was working outside today.  He does not know how long the tick is been there.  PCP Iona Beard, MD   Past Medical History:  Diagnosis Date  . Chronic back pain   . Chronic hip pain   . Depression   . Diabetes mellitus   . Diverticulitis   . H/O kidney transplant   . Hypertension   . Kidney stone   . Renal disorder   . Urinary retention     Patient Active Problem List   Diagnosis Date Noted  . Chest pain 03/20/2016    Past Surgical History:  Procedure Laterality Date  . COLONOSCOPY  05/27/2003   FTD:DUKGUR terminal ileum,rectum, and colon  . HERNIA REPAIR    . HIP SURGERY    . KIDNEY TRANSPLANT    . NEPHRECTOMY TRANSPLANTED ORGAN         Home Medications    Prior to Admission medications   Medication Sig Start Date End Date Taking? Authorizing Provider  acetaminophen (TYLENOL) 325 MG tablet Take 2 tablets (650 mg total) by mouth every 6 (six) hours as needed. 05/27/16   Mesner, Corene Cornea, MD  atorvastatin (LIPITOR) 10 MG tablet Take 10 mg by mouth every morning.     [provider]  cephALEXin (KEFLEX) 500 MG capsule Take 1 capsule (500 mg total) by mouth 4 (four) times daily. Patient not taking: Reported on 02/18/2017 01/09/17   Delia Heady, PA-C  ciprofloxacin (CIPRO) 500 MG tablet Take 1 tablet (500 mg total) by mouth 2 (two) times daily. Patient not taking: Reported on 12/04/2016 10/03/16   Rolland Porter, MD  cyclobenzaprine (FLEXERIL) 5 MG tablet Take 1 tablet (5 mg total) by mouth 3 (three) times daily as needed for muscle spasms.  02/15/17   Tanna Furry, MD  cycloSPORINE (SANDIMMUNE) 100 MG capsule Take 100 mg by mouth 2 (two) times daily. Take with 25 mg tablet to equal 125 mg twice daily    [provider]  cycloSPORINE (SANDIMMUNE) 25 MG capsule Take 25 mg by mouth 2 (two) times daily. Take with 100 mg tablet to equal dose of 125 mg twice daily    [provider]  esomeprazole (NEXIUM) 40 MG capsule Take 40 mg by mouth 2 (two) times daily.     [provider]  HYDROcodone-acetaminophen (NORCO/VICODIN) 5-325 MG tablet Take 1 tablet by mouth every 6 (six) hours as needed for moderate pain. 10/03/16   Rolland Porter, MD  metroNIDAZOLE (FLAGYL) 500 MG tablet Take 1 tablet (500 mg total) by mouth 3 (three) times daily. Patient not taking: Reported on 12/04/2016 10/03/16   Rolland Porter, MD  ondansetron (ZOFRAN ODT) 4 MG disintegrating tablet 4mg  ODT q4 hours prn nausea/vomit Patient not taking: Reported on 12/04/2016 07/29/16   [name], MD  ondansetron (ZOFRAN) 4 MG tablet Take 1 tablet (4 mg total) by mouth every 8 (eight) hours as needed. Patient not taking: Reported on 02/18/2017 10/03/16   Rolland Porter, MD  predniSONE (DELTASONE) 5 MG  tablet Take 5 mg by mouth every morning.     [provider]  promethazine (PHENERGAN) 25 MG tablet Take 25 mg by mouth every 6 (six) hours as needed for nausea or vomiting.    [provider]  tamsulosin (FLOMAX) 0.4 MG CAPS capsule Take 0.4 mg by mouth daily.     [provider]  vitamin B-12 (CYANOCOBALAMIN) 1000 MCG tablet Take 1,000 mcg by mouth daily.    [provider]    Family History Family History  Problem Relation Age of Onset  . Hypertension Sister     Social History Social History   Tobacco Use  . Smoking status: Former Smoker    Packs/day: 0.00    Years: 5.00    Pack years: 0.00    Last attempt to quit: 06/14/1972    Years since quitting: 44.9  . Smokeless tobacco: Never Used  Substance Use Topics  . Alcohol use:  No  . Drug use: No     Allergies   Codeine; Enalapril maleate; Nsaids; Tape; and Vasotec   Review of Systems Review of Systems  All other systems reviewed and are negative.    Physical Exam Updated Vital Signs BP (!) 131/98 (BP Location: Right Arm)   Pulse 72   Temp 97.8 F (36.6 C)   Resp 16   Ht 5\' 11"  (1.803 m)   Wt 82.6 kg (182 lb)   SpO2 99%   BMI 25.38 kg/m   Physical Exam  Constitutional: He is oriented to person, place, and time. He appears well-developed and well-nourished. No distress.  Sleeping in no distress  HENT:  Head: Normocephalic and atraumatic.  Right Ear: External ear normal.  Left Ear: External ear normal.  Nose: Nose normal.  Eyes: Conjunctivae and EOM are normal.  Cardiovascular: Normal rate.  Pulmonary/Chest: Effort normal. No respiratory distress.  Musculoskeletal: Normal range of motion.  Neurological: He is alert and oriented to person, place, and time. No cranial nerve deficit.  Skin: Skin is warm and dry. No rash noted.  Patient has a small non-engorged tick on his anterior right shoulder next to a long linear surgical scar of his shoulder.  There is no redness around the tick, there is no swelling.  Psychiatric: He has a normal mood and affect. His behavior is normal. Thought content normal.  Nursing note and vitals reviewed.    ED Treatments / Results  Labs (all labs ordered are listed, but only abnormal results are displayed) Labs Reviewed - No data to display  EKG  EKG Interpretation None       Radiology No results found.  Procedures Procedures (including critical care time)  Medications Ordered in ED Medications - No data to display   Initial Impression / Assessment and Plan / ED Course  I have reviewed the triage vital signs and the nursing notes.  Pertinent labs & imaging results that were available during my care of the patient were reviewed by me and considered in my medical decision making (see chart for  details).     The tick was flipped over onto his back with forceps, and removed.  He is very small it is hard to tell if the head was still attached.  I picked at the area where the tick is been located to make sure the head was gone.  Final Clinical Impressions(s) / ED Diagnoses   Final diagnoses:  Tick bite with subsequent removal of tick    ED Discharge Orders  None      Plan discharge  Rolland Porter, MD, Barbette Or, MD 05/04/17 [phone]

## 2017-05-14 DIAGNOSIS — I10 Essential (primary) hypertension: Secondary | ICD-10-CM | POA: Diagnosis not present

## 2017-05-27 ENCOUNTER — Encounter (HOSPITAL_COMMUNITY): Payer: Self-pay | Admitting: Emergency Medicine

## 2017-05-27 ENCOUNTER — Other Ambulatory Visit: Payer: Self-pay

## 2017-05-27 ENCOUNTER — Emergency Department (HOSPITAL_COMMUNITY)
Admission: EM | Admit: 2017-05-27 | Discharge: 2017-05-27 | Disposition: A | Discharge status: Home / Self Care | Payer: Medicare Other | Attending: Emergency Medicine | Admitting: Emergency Medicine

## 2017-05-27 DIAGNOSIS — Z79899 Other long term (current) drug therapy: Secondary | ICD-10-CM | POA: Insufficient documentation

## 2017-05-27 DIAGNOSIS — E119 Type 2 diabetes mellitus without complications: Secondary | ICD-10-CM | POA: Insufficient documentation

## 2017-05-27 DIAGNOSIS — I10 Essential (primary) hypertension: Secondary | ICD-10-CM | POA: Diagnosis not present

## 2017-05-27 DIAGNOSIS — R109 Unspecified abdominal pain: Secondary | ICD-10-CM

## 2017-05-27 DIAGNOSIS — R1084 Generalized abdominal pain: Secondary | ICD-10-CM | POA: Diagnosis not present

## 2017-05-27 DIAGNOSIS — Z87891 Personal history of nicotine dependence: Secondary | ICD-10-CM | POA: Diagnosis not present

## 2017-05-27 DIAGNOSIS — Z940 Kidney transplant status: Secondary | ICD-10-CM | POA: Diagnosis not present

## 2017-05-27 LAB — COMPREHENSIVE METABOLIC PANEL
ALK PHOS: 70 U/L (ref 38–126)
ALT: 15 U/L — ABNORMAL LOW (ref 17–63)
AST: 20 U/L (ref 15–41)
Albumin: 3.8 g/dL (ref 3.5–5.0)
Anion gap: 6 (ref 5–15)
BILIRUBIN TOTAL: 0.5 mg/dL (ref 0.3–1.2)
BUN: 20 mg/dL (ref 6–20)
CALCIUM: 9.3 mg/dL (ref 8.9–10.3)
CO2: 27 mmol/L (ref 22–32)
CREATININE: 1.38 mg/dL — AB (ref 0.61–1.24)
Chloride: 106 mmol/L (ref 101–111)
GFR, EST NON AFRICAN AMERICAN: 53 mL/min — AB (ref >60)
Glucose, Bld: 92 mg/dL (ref 65–99)
Potassium: 4.3 mmol/L (ref 3.5–5.1)
Sodium: 139 mmol/L (ref 135–145)
TOTAL PROTEIN: 7.1 g/dL (ref 6.5–8.1)

## 2017-05-27 LAB — CBC
HCT: 42.8 % (ref 39.0–52.0)
Hemoglobin: 13.7 g/dL (ref 13.0–17.0)
MCH: 22.7 pg — ABNORMAL LOW (ref 26.0–34.0)
MCHC: 32 g/dL (ref 30.0–36.0)
MCV: 71 fL — ABNORMAL LOW (ref 78.0–100.0)
PLATELETS: 133 10*3/uL — AB (ref 150–400)
RBC: 6.03 MIL/uL — AB (ref 4.22–5.81)
RDW: 15.4 % (ref 11.5–15.5)
WBC: 7.8 10*3/uL (ref 4.0–10.5)

## 2017-05-27 LAB — URINALYSIS, ROUTINE W REFLEX MICROSCOPIC
Bilirubin Urine: NEGATIVE
GLUCOSE, UA: NEGATIVE mg/dL
Hgb urine dipstick: NEGATIVE
KETONES UR: NEGATIVE mg/dL
LEUKOCYTES UA: NEGATIVE
Nitrite: NEGATIVE
PROTEIN: NEGATIVE mg/dL
Specific Gravity, Urine: 1.017 (ref 1.005–1.030)
pH: 7 (ref 5.0–8.0)

## 2017-05-27 LAB — LIPASE, BLOOD: LIPASE: 35 U/L (ref 11–51)

## 2017-05-27 MED ORDER — ONDANSETRON HCL 4 MG PO TABS: 4.0000 mg | ORAL_TABLET | Freq: Four times a day (QID) | ORAL | 0 refills | Status: DC

## 2017-05-27 MED ORDER — ONDANSETRON HCL 4 MG/2ML IJ SOLN
4.0000 mg | Freq: Once | INTRAMUSCULAR | Status: AC
  Administered 2017-05-27: 4 mg via INTRAVENOUS
  Filled 2017-05-27: qty 2

## 2017-05-27 MED ORDER — MORPHINE SULFATE (PF) 4 MG/ML IV SOLN
4.0000 mg | Freq: Once | INTRAVENOUS | Status: AC
  Administered 2017-05-27: 2 mg via INTRAVENOUS
  Filled 2017-05-27: qty 1

## 2017-05-27 MED ORDER — SODIUM CHLORIDE 0.9 % IV BOLUS (SEPSIS)
1000.0000 mL | Freq: Once | INTRAVENOUS | Status: AC
  Administered 2017-05-27: 1000 mL via INTRAVENOUS

## 2017-05-27 NOTE — ED Triage Notes (Signed)
PT c/o generalized abdominal pain with nausea and vomiting x2 days. PT states he had a bowel movement this am but consistency was harder than his normal.

## 2017-05-27 NOTE — ED Provider Notes (Signed)
[facility] EMERGENCY DEPARTMENT Provider Note   CSN: [id number] Arrival date & time: 05/27/17  9381     History   Chief Complaint Chief Complaint  Patient presents with  . Abdominal Pain    HPI Robert Durham is a 61 y.o. male.  Generalized abdominal pain with associated nausea and vomiting for 2 days.  Decreased oral intake.  Patient is status post renal transplant 1994.  No fever, sweats, chills, chest pain, dyspnea, dysuria.  Severity of symptoms is moderate.  Nothing makes symptoms better or worse.      Past Medical History:  Diagnosis Date  . Chronic back pain   . Chronic hip pain   . Depression   . Diabetes mellitus   . Diverticulitis   . H/O kidney transplant   . Hypertension   . Kidney stone   . Renal disorder   . Urinary retention     Patient Active Problem List   Diagnosis Date Noted  . Chest pain 03/20/2016    Past Surgical History:  Procedure Laterality Date  . COLONOSCOPY  05/27/2003   [id number] terminal ileum,rectum, and colon  . HERNIA REPAIR    . HIP SURGERY    . KIDNEY TRANSPLANT    . NEPHRECTOMY TRANSPLANTED ORGAN         Home Medications    Prior to Admission medications   Medication Sig Start Date End Date Taking? Authorizing Provider  acetaminophen (TYLENOL) 500 MG tablet Take 1,000 mg by mouth daily as needed for pain.   Yes [provider]  atorvastatin (LIPITOR) 10 MG tablet Take 10 mg by mouth every morning.    Yes [provider]  cycloSPORINE (SANDIMMUNE) 100 MG capsule Take 100 mg by mouth 2 (two) times daily. Take with 25 mg tablet to equal 125 mg twice daily   Yes [provider]  cycloSPORINE (SANDIMMUNE) 25 MG capsule Take 25 mg by mouth 2 (two) times daily. Take with 100 mg tablet to equal dose of 125 mg twice daily   Yes [provider]  esomeprazole (NEXIUM) 40 MG capsule Take 40 mg by mouth 2 (two) times daily.    Yes [provider]  iron polysaccharides (NIFEREX)  150 MG capsule Take 150 mg by mouth daily. 12/23/14  Yes [provider]  predniSONE (DELTASONE) 5 MG tablet Take 5 mg by mouth every morning.    Yes [provider]  promethazine (PHENERGAN) 25 MG tablet Take 25 mg by mouth every 6 (six) hours as needed for nausea or vomiting.   Yes [provider]  tamsulosin (FLOMAX) 0.4 MG CAPS capsule Take 0.4 mg by mouth daily.    Yes [provider]  vitamin B-12 (CYANOCOBALAMIN) 1000 MCG tablet Take 1,000 mcg by mouth daily.   Yes [provider]  vitamin C (ASCORBIC ACID) 500 MG tablet Take 500 mg by mouth daily. 12/23/14  Yes [provider]  ondansetron (ZOFRAN) 4 MG tablet Take 1 tablet (4 mg total) by mouth every 6 (six) hours. 05/27/17   Nat Christen, MD    Family History Family History  Problem Relation Age of Onset  . Hypertension Sister     Social History Social History   Tobacco Use  . Smoking status: Former Smoker    Packs/day: 0.00    Years: 5.00    Pack years: 0.00    Last attempt to quit: 06/14/1972    Years since quitting: 44.9  . Smokeless tobacco: Never Used  Substance Use  Topics  . Alcohol use: No  . Drug use: No     Allergies   Codeine; Enalapril maleate; Nsaids; Tape; Tolmetin; and Vasotec   Review of Systems Review of Systems  All other systems reviewed and are negative.    Physical Exam Updated Vital Signs BP (!) 141/89   Pulse (!) 51   Temp 97.8 F (36.6 C) (Oral)   Resp 16   Ht 5\' 11"  (1.803 m)   Wt 81.6 kg (180 lb)   SpO2 100%   BMI 25.10 kg/m   Physical Exam  Constitutional: He is oriented to person, place, and time. He appears well-developed and well-nourished.  HENT:  Head: Normocephalic and atraumatic.  Eyes: Conjunctivae are normal.  Neck: Neck supple.  Cardiovascular: Normal rate and regular rhythm.  Pulmonary/Chest: Effort normal and breath sounds normal.  Abdominal: Soft. Bowel sounds are normal.  Minimal generalized tenderness.    Musculoskeletal: Normal range of motion.  Neurological: He is alert and oriented to person, place, and time.  Skin: Skin is warm and dry.  Psychiatric: He has a normal mood and affect. His behavior is normal.  Nursing note and vitals reviewed.    ED Treatments / Results  Labs (all labs ordered are listed, but only abnormal results are displayed) Labs Reviewed  COMPREHENSIVE METABOLIC PANEL - Abnormal; Notable for the following components:      Result Value   Creatinine, Ser 1.38 (*)    ALT 15 (*)    GFR calc non Af Amer 53 (*)    All other components within normal limits  CBC - Abnormal; Notable for the following components:   RBC 6.03 (*)    MCV 71.0 (*)    MCH 22.7 (*)    Platelets 133 (*)    All other components within normal limits  URINALYSIS, ROUTINE W REFLEX MICROSCOPIC - Abnormal; Notable for the following components:   Color, Urine AMBER (*)    APPearance HAZY (*)    All other components within normal limits  LIPASE, BLOOD    EKG  EKG Interpretation None       Radiology No results found.  Procedures Procedures (including critical care time)  Medications Ordered in ED Medications  sodium chloride 0.9 % bolus 1,000 mL (0 mLs Intravenous Stopped 05/27/17 1315)  morphine 4 MG/ML injection 4 mg (2 mg Intravenous Given 05/27/17 1124)  ondansetron (ZOFRAN) injection 4 mg (4 mg Intravenous Given 05/27/17 1124)     Initial Impression / Assessment and Plan / ED Course  I have reviewed the triage vital signs and the nursing notes.  Pertinent labs & imaging results that were available during my care of the patient were reviewed by me and considered in my medical decision making (see chart for details).     Patient presents with generalized abdominal pain.  No acute abdomen on exam.  White count normal.  Creatinine is stable.  Urinalysis negative.  He feels better after IV fluids.  Discharge medications Zofran 4 mg.  Final Clinical Impressions(s) / ED  Diagnoses   Final diagnoses:  Abdominal pain, unspecified abdominal location    ED Discharge Orders        Ordered    ondansetron (ZOFRAN) 4 MG tablet  Every 6 hours     05/27/17 1424       Nat Christen, MD 05/27/17 1441

## 2017-05-27 NOTE — Discharge Instructions (Signed)
Labs are stable.  Creatinine [kidney function] is stable. Increase fluids.  Meds for nausea.

## 2017-05-30 DIAGNOSIS — I10 Essential (primary) hypertension: Secondary | ICD-10-CM | POA: Diagnosis not present

## 2017-05-30 DIAGNOSIS — R112 Nausea with vomiting, unspecified: Secondary | ICD-10-CM | POA: Diagnosis not present

## 2017-05-30 DIAGNOSIS — R825 Elevated urine levels of drugs, medicaments and biological substances: Secondary | ICD-10-CM | POA: Diagnosis not present

## 2017-06-14 DIAGNOSIS — I10 Essential (primary) hypertension: Secondary | ICD-10-CM | POA: Diagnosis not present

## 2017-07-12 DIAGNOSIS — Z7952 Long term (current) use of systemic steroids: Secondary | ICD-10-CM | POA: Diagnosis not present

## 2017-07-12 DIAGNOSIS — D649 Anemia, unspecified: Secondary | ICD-10-CM | POA: Diagnosis not present

## 2017-07-12 DIAGNOSIS — Z79899 Other long term (current) drug therapy: Secondary | ICD-10-CM | POA: Diagnosis not present

## 2017-07-12 DIAGNOSIS — Z23 Encounter for immunization: Secondary | ICD-10-CM | POA: Diagnosis not present

## 2017-07-12 DIAGNOSIS — Z4822 Encounter for aftercare following kidney transplant: Secondary | ICD-10-CM | POA: Diagnosis not present

## 2017-07-12 DIAGNOSIS — Z940 Kidney transplant status: Secondary | ICD-10-CM | POA: Diagnosis not present

## 2017-07-12 DIAGNOSIS — D509 Iron deficiency anemia, unspecified: Secondary | ICD-10-CM | POA: Diagnosis not present

## 2017-07-15 DIAGNOSIS — I10 Essential (primary) hypertension: Secondary | ICD-10-CM | POA: Diagnosis not present

## 2017-08-12 DIAGNOSIS — I10 Essential (primary) hypertension: Secondary | ICD-10-CM | POA: Diagnosis not present

## 2017-08-29 DIAGNOSIS — Z0000 Encounter for general adult medical examination without abnormal findings: Secondary | ICD-10-CM | POA: Diagnosis not present

## 2017-08-29 DIAGNOSIS — I10 Essential (primary) hypertension: Secondary | ICD-10-CM | POA: Diagnosis not present

## 2017-09-12 DIAGNOSIS — I10 Essential (primary) hypertension: Secondary | ICD-10-CM | POA: Diagnosis not present

## 2017-10-12 DIAGNOSIS — I10 Essential (primary) hypertension: Secondary | ICD-10-CM | POA: Diagnosis not present

## 2017-10-25 DIAGNOSIS — D509 Iron deficiency anemia, unspecified: Secondary | ICD-10-CM | POA: Diagnosis not present

## 2017-10-25 DIAGNOSIS — R3129 Other microscopic hematuria: Secondary | ICD-10-CM | POA: Diagnosis not present

## 2017-10-25 DIAGNOSIS — Z7952 Long term (current) use of systemic steroids: Secondary | ICD-10-CM | POA: Diagnosis not present

## 2017-10-25 DIAGNOSIS — Z940 Kidney transplant status: Secondary | ICD-10-CM | POA: Diagnosis not present

## 2017-10-25 DIAGNOSIS — D899 Disorder involving the immune mechanism, unspecified: Secondary | ICD-10-CM | POA: Diagnosis not present

## 2017-10-25 DIAGNOSIS — Z79899 Other long term (current) drug therapy: Secondary | ICD-10-CM | POA: Diagnosis not present

## 2017-10-25 DIAGNOSIS — Z4822 Encounter for aftercare following kidney transplant: Secondary | ICD-10-CM | POA: Diagnosis not present

## 2017-11-12 DIAGNOSIS — I10 Essential (primary) hypertension: Secondary | ICD-10-CM | POA: Diagnosis not present

## 2017-11-28 DIAGNOSIS — I10 Essential (primary) hypertension: Secondary | ICD-10-CM | POA: Diagnosis not present

## 2017-11-28 DIAGNOSIS — M545 Low back pain: Secondary | ICD-10-CM | POA: Diagnosis not present

## 2017-12-12 DIAGNOSIS — I10 Essential (primary) hypertension: Secondary | ICD-10-CM | POA: Diagnosis not present

## 2018-01-12 DIAGNOSIS — I10 Essential (primary) hypertension: Secondary | ICD-10-CM | POA: Diagnosis not present

## 2018-02-12 DIAGNOSIS — I10 Essential (primary) hypertension: Secondary | ICD-10-CM | POA: Diagnosis not present

## 2018-03-06 DIAGNOSIS — R112 Nausea with vomiting, unspecified: Secondary | ICD-10-CM | POA: Diagnosis not present

## 2018-03-06 DIAGNOSIS — Z23 Encounter for immunization: Secondary | ICD-10-CM | POA: Diagnosis not present

## 2018-03-06 DIAGNOSIS — I10 Essential (primary) hypertension: Secondary | ICD-10-CM | POA: Diagnosis not present

## 2018-03-14 DIAGNOSIS — I10 Essential (primary) hypertension: Secondary | ICD-10-CM | POA: Diagnosis not present

## 2018-05-14 DIAGNOSIS — I10 Essential (primary) hypertension: Secondary | ICD-10-CM | POA: Diagnosis not present

## 2018-06-05 DIAGNOSIS — I10 Essential (primary) hypertension: Secondary | ICD-10-CM | POA: Diagnosis not present

## 2018-06-06 DIAGNOSIS — Z7952 Long term (current) use of systemic steroids: Secondary | ICD-10-CM | POA: Diagnosis not present

## 2018-06-06 DIAGNOSIS — Z972 Presence of dental prosthetic device (complete) (partial): Secondary | ICD-10-CM | POA: Diagnosis not present

## 2018-06-06 DIAGNOSIS — R112 Nausea with vomiting, unspecified: Secondary | ICD-10-CM | POA: Diagnosis not present

## 2018-06-06 DIAGNOSIS — Z940 Kidney transplant status: Secondary | ICD-10-CM | POA: Diagnosis not present

## 2018-06-06 DIAGNOSIS — Z79899 Other long term (current) drug therapy: Secondary | ICD-10-CM | POA: Diagnosis not present

## 2018-06-06 DIAGNOSIS — Z4822 Encounter for aftercare following kidney transplant: Secondary | ICD-10-CM | POA: Diagnosis not present

## 2018-06-06 DIAGNOSIS — D899 Disorder involving the immune mechanism, unspecified: Secondary | ICD-10-CM | POA: Diagnosis not present

## 2018-06-14 DIAGNOSIS — I10 Essential (primary) hypertension: Secondary | ICD-10-CM | POA: Diagnosis not present

## 2018-06-19 ENCOUNTER — Other Ambulatory Visit (HOSPITAL_COMMUNITY)
Admission: RE | Admit: 2018-06-19 | Discharge: 2018-06-19 | Disposition: A | Discharge status: Home / Self Care | Payer: Medicare Other | Source: Ambulatory Visit | Attending: Internal Medicine | Admitting: Internal Medicine

## 2018-06-19 DIAGNOSIS — Z79899 Other long term (current) drug therapy: Secondary | ICD-10-CM | POA: Diagnosis not present

## 2018-06-19 DIAGNOSIS — Z940 Kidney transplant status: Secondary | ICD-10-CM | POA: Insufficient documentation

## 2018-06-19 DIAGNOSIS — D899 Disorder involving the immune mechanism, unspecified: Secondary | ICD-10-CM | POA: Diagnosis not present

## 2018-06-19 LAB — BASIC METABOLIC PANEL
Anion gap: 7 (ref 5–15)
BUN: 15 mg/dL (ref 8–23)
CHLORIDE: 106 mmol/L (ref 98–111)
CO2: 23 mmol/L (ref 22–32)
CREATININE: 1.3 mg/dL — AB (ref 0.61–1.24)
Calcium: 9.1 mg/dL (ref 8.9–10.3)
GFR calc Af Amer: >60 mL/min (ref >60)
GFR calc non Af Amer: 58 mL/min — ABNORMAL LOW (ref >60)
GLUCOSE: 87 mg/dL (ref 70–99)
Potassium: 3.6 mmol/L (ref 3.5–5.1)
Sodium: 136 mmol/L (ref 135–145)

## 2018-06-20 LAB — EPSTEIN BARR VRS(EBV DNA BY PCR)
EBV DNA QN BY PCR: NEGATIVE {copies}/mL
LOG10 EBV DNA QN PCR: UNDETERMINED {Log_copies}/mL

## 2018-06-21 ENCOUNTER — Other Ambulatory Visit: Payer: Self-pay

## 2018-06-21 ENCOUNTER — Encounter (HOSPITAL_COMMUNITY): Payer: Self-pay | Admitting: Emergency Medicine

## 2018-06-21 ENCOUNTER — Emergency Department (HOSPITAL_COMMUNITY)
Admission: EM | Admit: 2018-06-21 | Discharge: 2018-06-21 | Disposition: A | Discharge status: Home / Self Care | Payer: Medicare Other | Attending: Emergency Medicine | Admitting: Emergency Medicine

## 2018-06-21 DIAGNOSIS — E119 Type 2 diabetes mellitus without complications: Secondary | ICD-10-CM | POA: Diagnosis not present

## 2018-06-21 DIAGNOSIS — Z87891 Personal history of nicotine dependence: Secondary | ICD-10-CM | POA: Diagnosis not present

## 2018-06-21 DIAGNOSIS — Z79899 Other long term (current) drug therapy: Secondary | ICD-10-CM | POA: Insufficient documentation

## 2018-06-21 DIAGNOSIS — Z940 Kidney transplant status: Secondary | ICD-10-CM | POA: Insufficient documentation

## 2018-06-21 DIAGNOSIS — R112 Nausea with vomiting, unspecified: Secondary | ICD-10-CM | POA: Insufficient documentation

## 2018-06-21 DIAGNOSIS — R1110 Vomiting, unspecified: Secondary | ICD-10-CM | POA: Diagnosis present

## 2018-06-21 DIAGNOSIS — R1084 Generalized abdominal pain: Secondary | ICD-10-CM | POA: Diagnosis not present

## 2018-06-21 LAB — COMPREHENSIVE METABOLIC PANEL
ALT: 15 U/L (ref 0–44)
AST: 19 U/L (ref 15–41)
Albumin: 3.8 g/dL (ref 3.5–5.0)
Alkaline Phosphatase: 53 U/L (ref 38–126)
Anion gap: 5 (ref 5–15)
BILIRUBIN TOTAL: 0.7 mg/dL (ref 0.3–1.2)
BUN: 15 mg/dL (ref 8–23)
CO2: 24 mmol/L (ref 22–32)
CREATININE: 1.43 mg/dL — AB (ref 0.61–1.24)
Calcium: 9 mg/dL (ref 8.9–10.3)
Chloride: 108 mmol/L (ref 98–111)
GFR calc Af Amer: 60 mL/min — ABNORMAL LOW (ref >60)
GFR, EST NON AFRICAN AMERICAN: 52 mL/min — AB (ref >60)
Glucose, Bld: 97 mg/dL (ref 70–99)
POTASSIUM: 3.9 mmol/L (ref 3.5–5.1)
Sodium: 137 mmol/L (ref 135–145)
Total Protein: 7.4 g/dL (ref 6.5–8.1)

## 2018-06-21 LAB — CBC WITH DIFFERENTIAL/PLATELET
Abs Immature Granulocytes: 0.06 10*3/uL (ref 0.00–0.07)
BASOS PCT: 1 %
Basophils Absolute: 0.1 10*3/uL (ref 0.0–0.1)
EOS ABS: 0.3 10*3/uL (ref 0.0–0.5)
EOS PCT: 3 %
HEMATOCRIT: 44.2 % (ref 39.0–52.0)
Hemoglobin: 13.5 g/dL (ref 13.0–17.0)
Immature Granulocytes: 1 %
Lymphocytes Relative: 27 %
Lymphs Abs: 2.5 10*3/uL (ref 0.7–4.0)
MCH: 22 pg — AB (ref 26.0–34.0)
MCHC: 30.5 g/dL (ref 30.0–36.0)
MCV: 72 fL — AB (ref 80.0–100.0)
MONO ABS: 0.7 10*3/uL (ref 0.1–1.0)
MONOS PCT: 8 %
NEUTROS PCT: 60 %
Neutro Abs: 5.7 10*3/uL (ref 1.7–7.7)
PLATELETS: 149 10*3/uL — AB (ref 150–400)
RBC: 6.14 MIL/uL — ABNORMAL HIGH (ref 4.22–5.81)
RDW: 17.3 % — AB (ref 11.5–15.5)
WBC: 9.3 10*3/uL (ref 4.0–10.5)
nRBC: 0 % (ref 0.0–0.2)

## 2018-06-21 LAB — URINALYSIS, ROUTINE W REFLEX MICROSCOPIC
Bilirubin Urine: NEGATIVE
GLUCOSE, UA: NEGATIVE mg/dL
HGB URINE DIPSTICK: NEGATIVE
KETONES UR: NEGATIVE mg/dL
Leukocytes, UA: NEGATIVE
Nitrite: NEGATIVE
PH: 6 (ref 5.0–8.0)
PROTEIN: NEGATIVE mg/dL
Specific Gravity, Urine: 1.025 (ref 1.005–1.030)

## 2018-06-21 LAB — LIPASE, BLOOD: LIPASE: 37 U/L (ref 11–51)

## 2018-06-21 LAB — CYCLOSPORINE: CYCLOSPORINE, LABCORP: 81 ng/mL — AB (ref 100–400)

## 2018-06-21 MED ORDER — ONDANSETRON HCL 4 MG/2ML IJ SOLN
4.0000 mg | Freq: Once | INTRAMUSCULAR | Status: AC
  Administered 2018-06-21: 4 mg via INTRAVENOUS
  Filled 2018-06-21: qty 2

## 2018-06-21 MED ORDER — SODIUM CHLORIDE 0.9 % IV BOLUS
1000.0000 mL | Freq: Once | INTRAVENOUS | Status: AC
Start: 2018-06-21 — End: 2018-06-21
  Administered 2018-06-21: 1000 mL via INTRAVENOUS

## 2018-06-21 MED ORDER — ONDANSETRON HCL 4 MG PO TABS: 4.0000 mg | ORAL_TABLET | Freq: Four times a day (QID) | ORAL | 0 refills | Status: DC | PRN

## 2018-06-21 MED ORDER — MORPHINE SULFATE (PF) 4 MG/ML IV SOLN
4.0000 mg | Freq: Once | INTRAVENOUS | Status: AC
  Administered 2018-06-21: 4 mg via INTRAVENOUS
  Filled 2018-06-21: qty 1

## 2018-06-21 NOTE — ED Provider Notes (Signed)
[facility] EMERGENCY DEPARTMENT Provider Note   CSN: [id number] Arrival date & time: 06/21/18  0443     History   Chief Complaint Chief Complaint  Patient presents with  . Emesis    HPI Robert Durham is a 63 y.o. male.  The history is provided by the patient.  He has history of diabetes, hypertension, kidney transplant and comes in with abdominal pain and vomiting for the last 5 days.  He has not been able to hold anything down.  Abdominal pain is generalized and he rates it at 9/10.  He has had chills and sweats, but is not certain if he was running a fever because he does not have a thermometer.  He denies constipation or diarrhea.  He denies any urinary difficulty.  He denies arthralgias or myalgias.  He has not done anything to treat his symptoms at home.  Past Medical History:  Diagnosis Date  . Chronic back pain   . Chronic hip pain   . Depression   . Diabetes mellitus   . Diverticulitis   . H/O kidney transplant   . Hypertension   . Kidney stone   . Renal disorder   . Urinary retention     Patient Active Problem List   Diagnosis Date Noted  . Chest pain 03/20/2016    Past Surgical History:  Procedure Laterality Date  . COLONOSCOPY  05/27/2003   DGL:[id number] terminal ileum,rectum, and colon  . HERNIA REPAIR    . HIP SURGERY    . KIDNEY TRANSPLANT    . NEPHRECTOMY TRANSPLANTED ORGAN          Home Medications    Prior to Admission medications   Medication Sig Start Date End Date Taking? Authorizing Provider  acetaminophen (TYLENOL) 500 MG tablet Take 1,000 mg by mouth daily as needed for pain.    [provider]  atorvastatin (LIPITOR) 10 MG tablet Take 10 mg by mouth every morning.     [provider]  cycloSPORINE (SANDIMMUNE) 100 MG capsule Take 100 mg by mouth 2 (two) times daily. Take with 25 mg tablet to equal 125 mg twice daily    [provider]  cycloSPORINE (SANDIMMUNE) 25 MG capsule Take 25 mg by mouth 2 (two)  times daily. Take with 100 mg tablet to equal dose of 125 mg twice daily    [provider]  esomeprazole (NEXIUM) 40 MG capsule Take 40 mg by mouth 2 (two) times daily.     [provider]  iron polysaccharides (NIFEREX) 150 MG capsule Take 150 mg by mouth daily. 12/23/14   [provider]  ondansetron (ZOFRAN) 4 MG tablet Take 1 tablet (4 mg total) by mouth every 6 (six) hours. 05/27/17   Nat Christen, MD  predniSONE (DELTASONE) 5 MG tablet Take 5 mg by mouth every morning.     [provider]  promethazine (PHENERGAN) 25 MG tablet Take 25 mg by mouth every 6 (six) hours as needed for nausea or vomiting.    [provider]  tamsulosin (FLOMAX) 0.4 MG CAPS capsule Take 0.4 mg by mouth daily.     [provider]  vitamin B-12 (CYANOCOBALAMIN) 1000 MCG tablet Take 1,000 mcg by mouth daily.    [provider]  vitamin C (ASCORBIC ACID) 500 MG tablet Take 500 mg by mouth daily. 12/23/14   [provider]    Family History Family History  Problem Relation Age of Onset  . Hypertension Sister  Social History Social History   Tobacco Use  . Smoking status: Former Smoker    Packs/day: 0.00    Years: 5.00    Pack years: 0.00    Last attempt to quit: 06/14/1972    Years since quitting: 46.0  . Smokeless tobacco: Never Used  Substance Use Topics  . Alcohol use: No  . Drug use: No     Allergies   Codeine; Enalapril maleate; Nsaids; Tape; Tolmetin; and Vasotec   Review of Systems Review of Systems  All other systems reviewed and are negative.    Physical Exam Updated Vital Signs BP (!) 128/117 (BP Location: Left Arm)   Pulse 66   Temp 97.6 F (36.4 C) (Oral)   Resp 18   Ht 5\' 11"  (1.803 m)   Wt 88.5 kg   SpO2 100%   BMI 27.20 kg/m   Physical Exam Vitals signs and nursing note reviewed.    63 year old male, resting comfortably and in no acute distress. Vital signs are significant for elevated  diastolic blood pressure. Oxygen saturation is 100%, which is normal. Head is normocephalic and atraumatic. PERRLA, EOMI. Oropharynx is clear. Neck is nontender and supple without adenopathy or JVD. Back is nontender and there is no CVA tenderness. Lungs are clear without rales, wheezes, or rhonchi. Chest is nontender. Heart has regular rate and rhythm without murmur. Abdomen is soft, flat, with mild tenderness diffusely.  Renal transplant is palpable in the left lower quadrant and has mild tenderness.  There are no other masses or hepatosplenomegaly and peristalsis is hypoactive. Extremities have no cyanosis or edema, full range of motion is present. Skin is warm and dry without rash. Neurologic: Mental status is normal, cranial nerves are intact, there are no motor or sensory deficits.  ED Treatments / Results  Labs (all labs ordered are listed, but only abnormal results are displayed) Labs Reviewed  COMPREHENSIVE METABOLIC PANEL - Abnormal; Notable for the following components:      Result Value   Creatinine, Ser 1.43 (*)    GFR calc non Af Amer 52 (*)    GFR calc Af Amer 60 (*)    All other components within normal limits  CBC WITH DIFFERENTIAL/PLATELET - Abnormal; Notable for the following components:   RBC 6.14 (*)    MCV 72.0 (*)    MCH 22.0 (*)    RDW 17.3 (*)    Platelets 149 (*)    All other components within normal limits  LIPASE, BLOOD  URINALYSIS, ROUTINE W REFLEX MICROSCOPIC   Procedures Procedures   Medications Ordered in ED Medications  sodium chloride 0.9 % bolus 1,000 mL (1,000 mLs Intravenous New Bag/Given 06/21/18 0548)  ondansetron (ZOFRAN) injection 4 mg (4 mg Intravenous Given 06/21/18 0548)  morphine 4 MG/ML injection 4 mg (4 mg Intravenous Given 06/21/18 0549)     Initial Impression / Assessment and Plan / ED Course  I have reviewed the triage vital signs and the nursing notes.  Pertinent labs & imaging results that were available during my care of the  patient were reviewed by me and considered in my medical decision making (see chart for details).  Abdominal pain, nausea, vomiting.  At this point, I do not see evidence of pathology beyond emesis.  I am concerned about possible acute kidney injury involving his transplanted kidney.  He will be given IV fluids, ondansetron, morphine and will check screening labs and urinalysis.  Old records are reviewed, and he had been seen for  similar illness in September and December 2018, and had essentially negative CT of abdomen and pelvis in September 2018.  With relatively benign abdominal exam today, I do not feel CT scan is needed today.  Labs show stable creatinine, no evidence of urinary tract infection.  He feels much better after above-noted treatment.  He is discharged with prescription for ondansetron.  Return precautions discussed.  Final Clinical Impressions(s) / ED Diagnoses   Final diagnoses:  Non-intractable vomiting with nausea, unspecified vomiting type  Abdominal pain, generalized  Renal transplant recipient    ED Discharge Orders         Ordered    ondansetron (ZOFRAN) 4 MG tablet  Every 6 hours PRN     06/21/18 3335           Delora Fuel, MD 45/62/56 [phone]

## 2018-06-21 NOTE — ED Triage Notes (Signed)
Pt c/o generalized abd pain, nausea and vomiting x 5 days, pt further reports chills and decreased appetite

## 2018-06-21 NOTE — Discharge Instructions (Addendum)
Return if you start running a fever, or if pain is getting worse.  Make sure you are drinking enough fluids.

## 2018-06-24 ENCOUNTER — Emergency Department (HOSPITAL_COMMUNITY)
Admission: EM | Admit: 2018-06-24 | Discharge: 2018-06-24 | Disposition: A | Discharge status: Home / Self Care | Payer: Medicare Other | Attending: Emergency Medicine | Admitting: Emergency Medicine

## 2018-06-24 ENCOUNTER — Emergency Department (HOSPITAL_COMMUNITY): Payer: Medicare Other

## 2018-06-24 ENCOUNTER — Encounter (HOSPITAL_COMMUNITY): Payer: Self-pay | Admitting: Emergency Medicine

## 2018-06-24 DIAGNOSIS — E119 Type 2 diabetes mellitus without complications: Secondary | ICD-10-CM | POA: Diagnosis not present

## 2018-06-24 DIAGNOSIS — Z87891 Personal history of nicotine dependence: Secondary | ICD-10-CM | POA: Insufficient documentation

## 2018-06-24 DIAGNOSIS — I10 Essential (primary) hypertension: Secondary | ICD-10-CM | POA: Diagnosis not present

## 2018-06-24 DIAGNOSIS — Z79899 Other long term (current) drug therapy: Secondary | ICD-10-CM | POA: Insufficient documentation

## 2018-06-24 DIAGNOSIS — J40 Bronchitis, not specified as acute or chronic: Secondary | ICD-10-CM | POA: Diagnosis not present

## 2018-06-24 DIAGNOSIS — R05 Cough: Secondary | ICD-10-CM | POA: Diagnosis not present

## 2018-06-24 LAB — COMPREHENSIVE METABOLIC PANEL
ALK PHOS: 49 U/L (ref 38–126)
ALT: 18 U/L (ref 0–44)
AST: 24 U/L (ref 15–41)
Albumin: 4 g/dL (ref 3.5–5.0)
Anion gap: 9 (ref 5–15)
BILIRUBIN TOTAL: 0.9 mg/dL (ref 0.3–1.2)
BUN: 16 mg/dL (ref 8–23)
CO2: 22 mmol/L (ref 22–32)
CREATININE: 1.4 mg/dL — AB (ref 0.61–1.24)
Calcium: 9.4 mg/dL (ref 8.9–10.3)
Chloride: 106 mmol/L (ref 98–111)
GFR calc Af Amer: >60 mL/min (ref >60)
GFR, EST NON AFRICAN AMERICAN: 53 mL/min — AB (ref >60)
Glucose, Bld: 95 mg/dL (ref 70–99)
Potassium: 3.5 mmol/L (ref 3.5–5.1)
Sodium: 137 mmol/L (ref 135–145)
TOTAL PROTEIN: 7.8 g/dL (ref 6.5–8.1)

## 2018-06-24 LAB — INFLUENZA PANEL BY PCR (TYPE A & B)
Influenza A By PCR: NEGATIVE
Influenza B By PCR: NEGATIVE

## 2018-06-24 LAB — CBC WITH DIFFERENTIAL/PLATELET
ABS IMMATURE GRANULOCYTES: 0.06 10*3/uL (ref 0.00–0.07)
BASOS ABS: 0 10*3/uL (ref 0.0–0.1)
Basophils Relative: 1 %
EOS ABS: 0.3 10*3/uL (ref 0.0–0.5)
Eosinophils Relative: 3 %
HEMATOCRIT: 44.4 % (ref 39.0–52.0)
HEMOGLOBIN: 13.7 g/dL (ref 13.0–17.0)
IMMATURE GRANULOCYTES: 1 %
LYMPHS ABS: 2.7 10*3/uL (ref 0.7–4.0)
LYMPHS PCT: 34 %
MCH: 21.8 pg — ABNORMAL LOW (ref 26.0–34.0)
MCHC: 30.9 g/dL (ref 30.0–36.0)
MCV: 70.6 fL — AB (ref 80.0–100.0)
MONOS PCT: 10 %
Monocytes Absolute: 0.8 10*3/uL (ref 0.1–1.0)
NEUTROS ABS: 4.2 10*3/uL (ref 1.7–7.7)
NEUTROS PCT: 51 %
Platelets: 172 10*3/uL (ref 150–400)
RBC: 6.29 MIL/uL — ABNORMAL HIGH (ref 4.22–5.81)
RDW: 17.1 % — ABNORMAL HIGH (ref 11.5–15.5)
WBC: 8.1 10*3/uL (ref 4.0–10.5)
nRBC: 0 % (ref 0.0–0.2)

## 2018-06-24 MED ORDER — PANTOPRAZOLE SODIUM 40 MG IV SOLR
40.0000 mg | Freq: Once | INTRAVENOUS | Status: AC
  Administered 2018-06-24: 40 mg via INTRAVENOUS
  Filled 2018-06-24: qty 40

## 2018-06-24 MED ORDER — DOXYCYCLINE HYCLATE 100 MG PO CAPS: 100.0000 mg | ORAL_CAPSULE | Freq: Two times a day (BID) | ORAL | 0 refills | Status: DC

## 2018-06-24 MED ORDER — ONDANSETRON HCL 4 MG/2ML IJ SOLN
4.0000 mg | Freq: Once | INTRAMUSCULAR | Status: AC
  Administered 2018-06-24: 4 mg via INTRAVENOUS
  Filled 2018-06-24: qty 2

## 2018-06-24 MED ORDER — KETOROLAC TROMETHAMINE 30 MG/ML IJ SOLN
30.0000 mg | Freq: Once | INTRAMUSCULAR | Status: AC
  Administered 2018-06-24: 30 mg via INTRAVENOUS
  Filled 2018-06-24: qty 1

## 2018-06-24 MED ORDER — SODIUM CHLORIDE 0.9 % IV BOLUS
1000.0000 mL | Freq: Once | INTRAVENOUS | Status: AC
  Administered 2018-06-24: 1000 mL via INTRAVENOUS

## 2018-06-24 MED ORDER — TRAMADOL HCL 50 MG PO TABS: 50.0000 mg | ORAL_TABLET | Freq: Four times a day (QID) | ORAL | 0 refills | Status: DC | PRN

## 2018-06-24 NOTE — Discharge Instructions (Addendum)
Drink plenty of fluids take Tylenol for pain.  If Tylenol does not work you can use the Ultram.  Finish off the antibiotics.  Follow-up with your doctor next week for recheck

## 2018-06-24 NOTE — ED Triage Notes (Signed)
Pt states he has been hurting all over with productive cough since last week.  States he was here last Saturday for same and nothing has changed.  Unsure if running fever at home.

## 2018-06-24 NOTE — ED Provider Notes (Signed)
[facility] EMERGENCY DEPARTMENT Provider Note   CSN: [id number] Arrival date & time: 06/24/18  1962     History   Chief Complaint Chief Complaint  Patient presents with  . Generalized Body Aches    HPI Robert Durham is a 63 y.o. male.  Patient complains of a cough for 2 weeks with aches and weakness.  He was seen in the emergency department a week ago and treated for a viral type syndrome  The history is provided by the patient. No language interpreter was used.  Cough  Cough characteristics:  Productive Sputum characteristics:  [place] Severity:  Moderate Onset quality:  Sudden Duration:  2 weeks Timing:  Constant Progression:  Worsening Chronicity:  New Smoker: no   Context: not animal exposure   Relieved by:  Nothing Worsened by:  Nothing Associated symptoms: no chest pain, no eye discharge, no headaches and no rash     Past Medical History:  Diagnosis Date  . Chronic back pain   . Chronic hip pain   . Depression   . Diabetes mellitus   . Diverticulitis   . H/O kidney transplant   . Hypertension   . Kidney stone   . Renal disorder   . Urinary retention     Patient Active Problem List   Diagnosis Date Noted  . Chest pain 03/20/2016    Past Surgical History:  Procedure Laterality Date  . COLONOSCOPY  05/27/2003   [id number] terminal ileum,rectum, and colon  . HERNIA REPAIR    . HIP SURGERY    . KIDNEY TRANSPLANT    . NEPHRECTOMY TRANSPLANTED ORGAN          Home Medications    Prior to Admission medications   Medication Sig Start Date End Date Taking? Authorizing Provider  acetaminophen (TYLENOL) 500 MG tablet Take 1,000 mg by mouth daily as needed for pain.    [provider]  atorvastatin (LIPITOR) 10 MG tablet Take 10 mg by mouth every morning.     [provider]  cycloSPORINE (SANDIMMUNE) 100 MG capsule Take 100 mg by mouth 2 (two) times daily. Take with 25 mg tablet to equal 125 mg twice daily    [provider]  cycloSPORINE (SANDIMMUNE) 25 MG capsule Take 25 mg by mouth 2 (two) times daily. Take with 100 mg tablet to equal dose of 125 mg twice daily    [provider]  doxycycline (VIBRAMYCIN) 100 MG capsule Take 1 capsule (100 mg total) by mouth 2 (two) times daily. One po bid x 7 days 06/24/18   [name], MD  esomeprazole (NEXIUM) 40 MG capsule Take 40 mg by mouth 2 (two) times daily.     [provider]  iron polysaccharides (NIFEREX) 150 MG capsule Take 150 mg by mouth daily. 12/23/14   [provider]  ondansetron (ZOFRAN) 4 MG tablet Take 1 tablet (4 mg total) by mouth every 6 (six) hours as needed for nausea or vomiting. 06/23/39   Delora Fuel, MD  predniSONE (DELTASONE) 5 MG tablet Take 5 mg by mouth every morning.     [provider]  promethazine (PHENERGAN) 25 MG tablet Take 25 mg by mouth every 6 (six) hours as needed for nausea or vomiting.    [provider]  tamsulosin (FLOMAX) 0.4 MG CAPS capsule Take 0.4 mg by mouth daily.     [provider]  traMADol (ULTRAM) 50 MG tablet Take 1 tablet (50 mg total) by mouth every 6 (six)  hours as needed. 06/24/18   [name], MD  vitamin B-12 (CYANOCOBALAMIN) 1000 MCG tablet Take 1,000 mcg by mouth daily.    [provider]  vitamin C (ASCORBIC ACID) 500 MG tablet Take 500 mg by mouth daily. 12/23/14   [provider]    Family History Family History  Problem Relation Age of Onset  . Hypertension Sister     Social History Social History   Tobacco Use  . Smoking status: Former Smoker    Packs/day: 0.00    Years: 5.00    Pack years: 0.00    Last attempt to quit: 06/14/1972    Years since quitting: 46.0  . Smokeless tobacco: Never Used  Substance Use Topics  . Alcohol use: No  . Drug use: No     Allergies   Codeine; Enalapril maleate; Nsaids; Tape; Tolmetin; and Vasotec   Review of Systems Review of Systems  Constitutional: Negative for  appetite change and fatigue.  HENT: Negative for congestion, ear discharge and sinus pressure.   Eyes: Negative for discharge.  Respiratory: Positive for cough.   Cardiovascular: Negative for chest pain.  Gastrointestinal: Negative for abdominal pain and diarrhea.  Genitourinary: Negative for frequency and hematuria.  Musculoskeletal: Negative for back pain.  Skin: Negative for rash.  Neurological: Negative for seizures and headaches.  Psychiatric/Behavioral: Negative for hallucinations.     Physical Exam Updated Vital Signs BP 133/84 (BP Location: Right Arm)   Pulse 60   Temp 98.1 F (36.7 C) (Oral)   Resp 18   Ht 5\' 11"  (1.803 m)   Wt 88 kg   SpO2 98%   BMI 27.06 kg/m   Physical Exam Vitals signs reviewed.  Constitutional:      Appearance: He is well-developed.  HENT:     Head: Normocephalic.     Nose: Nose normal.  Eyes:     General: No scleral icterus.    Conjunctiva/sclera: Conjunctivae normal.  Neck:     Musculoskeletal: Neck supple.     Thyroid: No thyromegaly.  Cardiovascular:     Rate and Rhythm: Normal rate and regular rhythm.     Heart sounds: No murmur. No friction rub. No gallop.   Pulmonary:     Breath sounds: No stridor. No wheezing or rales.  Chest:     Chest wall: No tenderness.  Abdominal:     General: There is no distension.     Tenderness: There is no abdominal tenderness. There is no rebound.  Musculoskeletal: Normal range of motion.  Lymphadenopathy:     Cervical: No cervical adenopathy.  Skin:    Findings: No erythema or rash.  Neurological:     Mental Status: He is oriented to person, place, and time.     Motor: No abnormal muscle tone.     Coordination: Coordination normal.  Psychiatric:        Behavior: Behavior normal.      ED Treatments / Results  Labs (all labs ordered are listed, but only abnormal results are displayed) Labs Reviewed  CBC WITH DIFFERENTIAL/PLATELET - Abnormal; Notable for the following components:       Result Value   RBC 6.29 (*)    MCV 70.6 (*)    MCH 21.8 (*)    RDW 17.1 (*)    All other components within normal limits  COMPREHENSIVE METABOLIC PANEL - Abnormal; Notable for the following components:   Creatinine, Ser 1.40 (*)    GFR calc non Af Amer 53 (*)  All other components within normal limits  INFLUENZA PANEL BY PCR (TYPE A & B)    EKG None  Radiology Dg Chest 2 View  Result Date: 06/24/2018 CLINICAL DATA:  63 year old male with cough and flu-like symptoms. Transplant patient. EXAM: CHEST - 2 VIEW COMPARISON:  Prior chest x-ray 12/04/2016 FINDINGS: The lungs are clear and negative for focal airspace consolidation, pulmonary edema or suspicious pulmonary nodule. No pleural effusion or pneumothorax. Cardiac and mediastinal contours are within normal limits. Calcifications present in the transverse aorta. No acute fracture or lytic or blastic osseous lesions. The visualized upper abdominal bowel gas pattern is unremarkable. IMPRESSION: No active cardiopulmonary disease. Electronically Signed   By: Jacqulynn Cadet M.D.   On: 06/24/2018 08:32    Procedures Procedures (including critical care time)  Medications Ordered in ED Medications  sodium chloride 0.9 % bolus 1,000 mL (1,000 mLs Intravenous New Bag/Given 06/24/18 0756)  ondansetron (ZOFRAN) injection 4 mg (4 mg Intravenous Given 06/24/18 0756)  ketorolac (TORADOL) 30 MG/ML injection 30 mg (30 mg Intravenous Given 06/24/18 0756)  pantoprazole (PROTONIX) injection 40 mg (40 mg Intravenous Given 06/24/18 0756)     Initial Impression / Assessment and Plan / ED Course  I have reviewed the triage vital signs and the nursing notes.  Pertinent labs & imaging results that were available during my care of the patient were reviewed by me and considered in my medical decision making (see chart for details).   Labs unremarkable.  Chest x-ray does not show pneumonia.  Patient has a productive cough for the last couple weeks and  will be treated with doxycycline and follow-up   Final Clinical Impressions(s) / ED Diagnoses   Final diagnoses:  Bronchitis    ED Discharge Orders         Ordered    doxycycline (VIBRAMYCIN) 100 MG capsule  2 times daily     06/24/18 1040    traMADol (ULTRAM) 50 MG tablet  Every 6 hours PRN     06/24/18 1040           [name], MD 06/24/18 1044

## 2018-07-15 DIAGNOSIS — I10 Essential (primary) hypertension: Secondary | ICD-10-CM | POA: Diagnosis not present

## 2018-07-27 ENCOUNTER — Emergency Department (HOSPITAL_COMMUNITY): Payer: Medicare Other

## 2018-07-27 ENCOUNTER — Other Ambulatory Visit: Payer: Self-pay

## 2018-07-27 ENCOUNTER — Emergency Department (HOSPITAL_COMMUNITY)
Admission: EM | Admit: 2018-07-27 | Discharge: 2018-07-27 | Disposition: A | Discharge status: Home / Self Care | Payer: Medicare Other | Attending: Emergency Medicine | Admitting: Emergency Medicine

## 2018-07-27 ENCOUNTER — Encounter (HOSPITAL_COMMUNITY): Payer: Self-pay | Admitting: Emergency Medicine

## 2018-07-27 DIAGNOSIS — I10 Essential (primary) hypertension: Secondary | ICD-10-CM | POA: Insufficient documentation

## 2018-07-27 DIAGNOSIS — J101 Influenza due to other identified influenza virus with other respiratory manifestations: Secondary | ICD-10-CM | POA: Insufficient documentation

## 2018-07-27 DIAGNOSIS — R0789 Other chest pain: Secondary | ICD-10-CM | POA: Diagnosis not present

## 2018-07-27 DIAGNOSIS — R05 Cough: Secondary | ICD-10-CM | POA: Diagnosis not present

## 2018-07-27 DIAGNOSIS — R0602 Shortness of breath: Secondary | ICD-10-CM | POA: Insufficient documentation

## 2018-07-27 DIAGNOSIS — E119 Type 2 diabetes mellitus without complications: Secondary | ICD-10-CM | POA: Diagnosis not present

## 2018-07-27 DIAGNOSIS — Z87891 Personal history of nicotine dependence: Secondary | ICD-10-CM | POA: Diagnosis not present

## 2018-07-27 DIAGNOSIS — J111 Influenza due to unidentified influenza virus with other respiratory manifestations: Secondary | ICD-10-CM

## 2018-07-27 DIAGNOSIS — Z79899 Other long term (current) drug therapy: Secondary | ICD-10-CM | POA: Diagnosis not present

## 2018-07-27 LAB — CBC WITH DIFFERENTIAL/PLATELET
Abs Immature Granulocytes: 0.04 10*3/uL (ref 0.00–0.07)
BASOS ABS: 0 10*3/uL (ref 0.0–0.1)
Basophils Relative: 0 %
Eosinophils Absolute: 0.3 10*3/uL (ref 0.0–0.5)
Eosinophils Relative: 4 %
HCT: 47.7 % (ref 39.0–52.0)
Hemoglobin: 14.8 g/dL (ref 13.0–17.0)
Immature Granulocytes: 1 %
Lymphocytes Relative: 24 %
Lymphs Abs: 1.8 10*3/uL (ref 0.7–4.0)
MCH: 22.1 pg — ABNORMAL LOW (ref 26.0–34.0)
MCHC: 31 g/dL (ref 30.0–36.0)
MCV: 71.3 fL — ABNORMAL LOW (ref 80.0–100.0)
Monocytes Absolute: 0.9 10*3/uL (ref 0.1–1.0)
Monocytes Relative: 12 %
NEUTROS ABS: 4.3 10*3/uL (ref 1.7–7.7)
Neutrophils Relative %: 59 %
PLATELETS: 136 10*3/uL — AB (ref 150–400)
RBC: 6.69 MIL/uL — ABNORMAL HIGH (ref 4.22–5.81)
RDW: 17.2 % — ABNORMAL HIGH (ref 11.5–15.5)
WBC: 7.3 10*3/uL (ref 4.0–10.5)
nRBC: 0 % (ref 0.0–0.2)

## 2018-07-27 LAB — INFLUENZA PANEL BY PCR (TYPE A & B)
Influenza A By PCR: POSITIVE — AB
Influenza B By PCR: NEGATIVE

## 2018-07-27 LAB — COMPREHENSIVE METABOLIC PANEL
ALT: 18 U/L (ref 0–44)
ANION GAP: 10 (ref 5–15)
AST: 24 U/L (ref 15–41)
Albumin: 4.1 g/dL (ref 3.5–5.0)
Alkaline Phosphatase: 48 U/L (ref 38–126)
BUN: 19 mg/dL (ref 8–23)
CO2: 23 mmol/L (ref 22–32)
Calcium: 9.4 mg/dL (ref 8.9–10.3)
Chloride: 104 mmol/L (ref 98–111)
Creatinine, Ser: 1.51 mg/dL — ABNORMAL HIGH (ref 0.61–1.24)
GFR calc Af Amer: 56 mL/min — ABNORMAL LOW (ref >60)
GFR calc non Af Amer: 48 mL/min — ABNORMAL LOW (ref >60)
Glucose, Bld: 93 mg/dL (ref 70–99)
POTASSIUM: 3.9 mmol/L (ref 3.5–5.1)
Sodium: 137 mmol/L (ref 135–145)
Total Bilirubin: 0.8 mg/dL (ref 0.3–1.2)
Total Protein: 8.1 g/dL (ref 6.5–8.1)

## 2018-07-27 LAB — TROPONIN I: Troponin I: <0.03 ng/mL (ref <0.03)

## 2018-07-27 LAB — BRAIN NATRIURETIC PEPTIDE: B Natriuretic Peptide: 7 pg/mL (ref 0.0–100.0)

## 2018-07-27 MED ORDER — ALBUTEROL SULFATE (2.5 MG/3ML) 0.083% IN NEBU
2.5000 mg | INHALATION_SOLUTION | Freq: Once | RESPIRATORY_TRACT | Status: AC
  Administered 2018-07-27: 2.5 mg via RESPIRATORY_TRACT
  Filled 2018-07-27: qty 3

## 2018-07-27 MED ORDER — HYDROCOD POLST-CPM POLST ER 10-8 MG/5ML PO SUER
5.0000 mL | Freq: Once | ORAL | Status: AC
  Administered 2018-07-27: 5 mL via ORAL
  Filled 2018-07-27: qty 5

## 2018-07-27 MED ORDER — ALBUTEROL SULFATE HFA 108 (90 BASE) MCG/ACT IN AERS: 1.0000 | INHALATION_SPRAY | Freq: Four times a day (QID) | RESPIRATORY_TRACT | 0 refills | Status: DC | PRN

## 2018-07-27 MED ORDER — BENZONATATE 100 MG PO CAPS: 200.0000 mg | ORAL_CAPSULE | Freq: Three times a day (TID) | ORAL | 0 refills | Status: DC | PRN

## 2018-07-27 NOTE — ED Triage Notes (Signed)
Pt c/o cough, chest congestion, headache and body aches x 5 days, pt reports he has not taken any OTC meds at home

## 2018-07-27 NOTE — Discharge Instructions (Addendum)
You tested positive today for the flu.  It is important that you rest, drink plenty of fluids, take Tylenol every 4 hours if needed for pain or fever.  You may use the inhaler 1 to 2 puffs every 4-6 hours as needed.  Follow-up with your primary doctor for recheck, return to the ER for any worsening symptoms.

## 2018-07-27 NOTE — ED Provider Notes (Signed)
[facility] EMERGENCY DEPARTMENT Provider Note   CSN: [id number] Arrival date & time: 07/27/18  0623     History   Chief Complaint Chief Complaint  Patient presents with  . Cough    HPI Robert Durham is a 63 y.o. male.  HPI   Robert Durham is a 63 y.o. male who presents to the Emergency Department complaining of cough, generalized body aches, headache, and nasal congestion.  Symptoms have been present for 5 days.  He states his cough has been non-productive and associated with shortness of breath. He also endorses several episodes of vomiting with the last episode being this morning. He is noted to have elevated blood pressure today states that his kidney doctors took him off his blood pressure medications greater than 6 months ago.  He has been doing well up until this week.  He states that shortness of breath is constant but worse with exertion.  No lower extremity edema.  Fever at home is unknown, but endorses intermittent chills and sweats.  He has not been taking any over-the-counter cough or cold medications.  No recent change in medications.    Past Medical History:  Diagnosis Date  . Chronic back pain   . Chronic hip pain   . Depression   . Diabetes mellitus   . Diverticulitis   . H/O kidney transplant   . Hypertension   . Kidney stone   . Renal disorder   . Urinary retention     Patient Active Problem List   Diagnosis Date Noted  . Chest pain 03/20/2016    Past Surgical History:  Procedure Laterality Date  . COLONOSCOPY  05/27/2003   JSE:[id number] terminal ileum,rectum, and colon  . HERNIA REPAIR    . HIP SURGERY    . KIDNEY TRANSPLANT    . NEPHRECTOMY TRANSPLANTED ORGAN        Home Medications    Prior to Admission medications   Medication Sig Start Date End Date Taking? Authorizing Provider  acetaminophen (TYLENOL) 500 MG tablet Take 1,000 mg by mouth daily as needed for pain.    [provider]  atorvastatin (LIPITOR) 10 MG tablet  Take 10 mg by mouth every morning.     [provider]  cycloSPORINE (SANDIMMUNE) 100 MG capsule Take 100 mg by mouth 2 (two) times daily. Take with 25 mg tablet to equal 125 mg twice daily    [provider]  cycloSPORINE (SANDIMMUNE) 25 MG capsule Take 25 mg by mouth 2 (two) times daily. Take with 100 mg tablet to equal dose of 125 mg twice daily    [provider]  doxycycline (VIBRAMYCIN) 100 MG capsule Take 1 capsule (100 mg total) by mouth 2 (two) times daily. One po bid x 7 days 06/24/18   [name], MD  esomeprazole (NEXIUM) 40 MG capsule Take 40 mg by mouth 2 (two) times daily.     [provider]  iron polysaccharides (NIFEREX) 150 MG capsule Take 150 mg by mouth daily. 12/23/14   [provider]  ondansetron (ZOFRAN) 4 MG tablet Take 1 tablet (4 mg total) by mouth every 6 (six) hours as needed for nausea or vomiting. 11/13/58   Delora Fuel, MD  predniSONE (DELTASONE) 5 MG tablet Take 5 mg by mouth every morning.     [provider]  promethazine (PHENERGAN) 25 MG tablet Take 25 mg by mouth every 6 (six) hours as needed for nausea or vomiting.    [provider]  tamsulosin (FLOMAX) 0.4 MG CAPS capsule Take 0.4 mg by mouth daily.     [provider]  traMADol (ULTRAM) 50 MG tablet Take 1 tablet (50 mg total) by mouth every 6 (six) hours as needed. 06/24/18   [name], MD  vitamin B-12 (CYANOCOBALAMIN) 1000 MCG tablet Take 1,000 mcg by mouth daily.    [provider]  vitamin C (ASCORBIC ACID) 500 MG tablet Take 500 mg by mouth daily. 12/23/14   [provider]    Family History Family History  Problem Relation Age of Onset  . Hypertension Sister     Social History Social History   Tobacco Use  . Smoking status: Former Smoker    Packs/day: 0.00    Years: 5.00    Pack years: 0.00    Last attempt to quit: 06/14/1972    Years since quitting: 46.1  . Smokeless tobacco: Never Used    Substance Use Topics  . Alcohol use: No  . Drug use: No     Allergies   Codeine; Enalapril maleate; Nsaids; Tape; Tolmetin; and Vasotec   Review of Systems Review of Systems  Constitutional: Positive for chills. Negative for appetite change and fever.  HENT: Positive for congestion. Negative for sore throat and trouble swallowing.   Respiratory: Positive for cough, chest tightness and shortness of breath. Negative for wheezing.   Cardiovascular: Positive for chest pain. Negative for leg swelling.  Gastrointestinal: Positive for nausea and vomiting. Negative for abdominal pain and diarrhea.  Musculoskeletal: Positive for myalgias (generalized body aches). Negative for arthralgias, neck pain and neck stiffness.  Skin: Negative for rash.  Neurological: Positive for headaches. Negative for dizziness, weakness and numbness.  Hematological: Negative for adenopathy.     Physical Exam Updated Vital Signs BP 111/84 (BP Location: Left Arm)   Pulse 100   Temp (!) 100.6 F (38.1 C) (Oral)   Resp 18   Ht 5\' 11"  (1.803 m)   Wt 83.9 kg   SpO2 98%   BMI 25.80 kg/m   Physical Exam Vitals signs and nursing note reviewed.  Constitutional:      General: He is not in acute distress.    Appearance: He is well-developed.     Comments: Uncomfortable appearing  HENT:     Head: Normocephalic and atraumatic.     Right Ear: Tympanic membrane and ear canal normal.     Left Ear: Tympanic membrane and ear canal normal.     Mouth/Throat:     Mouth: Mucous membranes are moist.     Pharynx: Oropharynx is clear. Uvula midline. No oropharyngeal exudate or posterior oropharyngeal erythema.  Eyes:     Conjunctiva/sclera: Conjunctivae normal.  Neck:     Musculoskeletal: Full passive range of motion without pain, normal range of motion and neck supple. No neck rigidity.     Trachea: Phonation normal.  Cardiovascular:     Rate and Rhythm: Normal rate and regular rhythm.     Pulses: Normal pulses.      Heart sounds: No murmur.  Pulmonary:     Effort: Pulmonary effort is normal. No respiratory distress.     Breath sounds: Normal breath sounds. No stridor. No wheezing or rales.  Chest:     Chest wall: No tenderness.  Abdominal:     General: There is no distension.     Palpations: Abdomen is soft. There is no mass.     Tenderness: There is no abdominal tenderness. There is no guarding.  Musculoskeletal:  General: No swelling.     Right lower leg: No edema.     Left lower leg: No edema.  Lymphadenopathy:     Cervical: No cervical adenopathy.  Skin:    General: Skin is warm.     Findings: No rash.  Neurological:     General: No focal deficit present.     Mental Status: He is alert. Mental status is at baseline.     Sensory: No sensory deficit.     Motor: No weakness or abnormal muscle tone.      ED Treatments / Results  Labs (all labs ordered are listed, but only abnormal results are displayed) Labs Reviewed  COMPREHENSIVE METABOLIC PANEL - Abnormal; Notable for the following components:      Result Value   Creatinine, Ser 1.51 (*)    GFR calc non Af Amer 48 (*)    GFR calc Af Amer 56 (*)    All other components within normal limits  CBC WITH DIFFERENTIAL/PLATELET - Abnormal; Notable for the following components:   RBC 6.69 (*)    MCV 71.3 (*)    MCH 22.1 (*)    RDW 17.2 (*)    Platelets 136 (*)    All other components within normal limits  INFLUENZA PANEL BY PCR (TYPE A & B) - Abnormal; Notable for the following components:   Influenza A By PCR POSITIVE (*)    All other components within normal limits  TROPONIN I  BRAIN NATRIURETIC PEPTIDE    EKG None   EKG reviewed by Dr. Reather Converse  Radiology Dg Chest 2 View  Result Date: 07/27/2018 CLINICAL DATA:  Cough, chest congestion, headache, and body aches for 5 days, history of kidney transplant EXAM: CHEST - 2 VIEW COMPARISON:  06/24/2018 FINDINGS: Normal heart size, mediastinal contours, and pulmonary  vascularity. Atherosclerotic calcification aorta. Mild peribronchial thickening. No pulmonary infiltrate, pleural effusion, or pneumothorax. Bones unremarkable. IMPRESSION: Mild bronchitic changes without infiltrate. Electronically Signed   By: Lavonia Dana M.D.   On: 07/27/2018 09:08    Procedures Procedures (including critical care time)  Medications Ordered in ED Medications - No data to display   Initial Impression / Assessment and Plan / ED Course  I have reviewed the triage vital signs and the nursing notes.  Pertinent labs & imaging results that were available during my care of the patient were reviewed by me and considered in my medical decision making (see chart for details).    Vital signs reassuring.  Patient nontoxic-appearing influenza testing is positive.  Remaining lab work reassuring.  Chest x-ray is without pneumonia or pleural effusion.  Patient feeling better after medications and albuterol neb.  No concerning symptoms for acute abdomen or ACS.  No active vomiting or diarrhea during ER stay.  He has tolerated oral fluids.  He appears appropriate for discharge home, agrees to strict return precautions and outpatient follow-up.   Final Clinical Impressions(s) / ED Diagnoses   Final diagnoses:  Influenza    ED Discharge Orders    None       Kem Parkinson, PA-C 07/27/18 1221    Elnora Morrison, MD 07/27/18 1606

## 2018-07-27 NOTE — ED Notes (Signed)
Respiratory called for nebs

## 2018-07-27 NOTE — ED Notes (Signed)
RT notified of orders 

## 2018-09-04 DIAGNOSIS — I10 Essential (primary) hypertension: Secondary | ICD-10-CM | POA: Diagnosis not present

## 2018-09-04 DIAGNOSIS — Z940 Kidney transplant status: Secondary | ICD-10-CM | POA: Diagnosis not present

## 2018-12-04 DIAGNOSIS — Z79899 Other long term (current) drug therapy: Secondary | ICD-10-CM | POA: Diagnosis not present

## 2018-12-04 DIAGNOSIS — Z940 Kidney transplant status: Secondary | ICD-10-CM | POA: Diagnosis not present

## 2018-12-04 DIAGNOSIS — N183 Chronic kidney disease, stage 3 (moderate): Secondary | ICD-10-CM | POA: Diagnosis not present

## 2018-12-04 DIAGNOSIS — D899 Disorder involving the immune mechanism, unspecified: Secondary | ICD-10-CM | POA: Diagnosis not present

## 2018-12-13 ENCOUNTER — Other Ambulatory Visit: Payer: Self-pay

## 2018-12-13 ENCOUNTER — Emergency Department (HOSPITAL_COMMUNITY): Payer: Medicare Other

## 2018-12-13 ENCOUNTER — Encounter (HOSPITAL_COMMUNITY): Payer: Self-pay | Admitting: Emergency Medicine

## 2018-12-13 ENCOUNTER — Emergency Department (HOSPITAL_COMMUNITY)
Admission: EM | Admit: 2018-12-13 | Discharge: 2018-12-13 | Disposition: A | Discharge status: Home / Self Care | Payer: Medicare Other | Attending: Emergency Medicine | Admitting: Emergency Medicine

## 2018-12-13 DIAGNOSIS — Z940 Kidney transplant status: Secondary | ICD-10-CM | POA: Diagnosis not present

## 2018-12-13 DIAGNOSIS — Z96642 Presence of left artificial hip joint: Secondary | ICD-10-CM | POA: Diagnosis not present

## 2018-12-13 DIAGNOSIS — G8929 Other chronic pain: Secondary | ICD-10-CM | POA: Diagnosis not present

## 2018-12-13 DIAGNOSIS — Z87891 Personal history of nicotine dependence: Secondary | ICD-10-CM | POA: Insufficient documentation

## 2018-12-13 DIAGNOSIS — M25552 Pain in left hip: Secondary | ICD-10-CM | POA: Insufficient documentation

## 2018-12-13 DIAGNOSIS — E119 Type 2 diabetes mellitus without complications: Secondary | ICD-10-CM | POA: Diagnosis not present

## 2018-12-13 DIAGNOSIS — Z79899 Other long term (current) drug therapy: Secondary | ICD-10-CM | POA: Diagnosis not present

## 2018-12-13 DIAGNOSIS — M545 Low back pain, unspecified: Secondary | ICD-10-CM

## 2018-12-13 DIAGNOSIS — I10 Essential (primary) hypertension: Secondary | ICD-10-CM | POA: Insufficient documentation

## 2018-12-13 DIAGNOSIS — Z471 Aftercare following joint replacement surgery: Secondary | ICD-10-CM | POA: Diagnosis not present

## 2018-12-13 HISTORY — DX: Gastritis, unspecified, without bleeding: K29.70

## 2018-12-13 HISTORY — DX: Nausea: R11.0

## 2018-12-13 MED ORDER — HYDROCODONE-ACETAMINOPHEN 5-325 MG PO TABS
1.0000 | ORAL_TABLET | Freq: Once | ORAL | Status: AC
  Administered 2018-12-13: 1 via ORAL
  Filled 2018-12-13: qty 1

## 2018-12-13 MED ORDER — METHOCARBAMOL 750 MG PO TABS: 750.0000 mg | ORAL_TABLET | Freq: Three times a day (TID) | ORAL | 0 refills | Status: AC | PRN

## 2018-12-13 NOTE — ED Notes (Signed)
Pt back from x-ray.

## 2018-12-13 NOTE — Discharge Instructions (Signed)
Take the prescription as directed.  Apply moist heat or ice to the area(s) of discomfort, for 15 minutes at a time, several times per day for the next few days.  Do not fall asleep on a heating or ice pack.  Call your regular medical doctor today to schedule a follow up appointment this week.  Return to the Emergency Department immediately if worsening.

## 2018-12-13 NOTE — ED Provider Notes (Signed)
[facility] EMERGENCY DEPARTMENT Provider Note   CSN: [id number] Arrival date & time: 12/13/18  2841     History   Chief Complaint Chief Complaint  Patient presents with  . Back Pain    HPI Robert Durham is a 63 y.o. male.     HPI  Pt was seen at [place]. Per pt, c/o gradual onset and persistence of constant acute flair of his chronic left sided low back "pain" for the past several days. Has been associated with left "aching" hip pain. Denies any change in his usual chronic pain pattern for many years.  Pain worsens with palpation of the area and body position changes.  Pt states his pain worsened after he ran out of his usual narcotic medication prescription "too early" and "I can't get another one yet." Denies incont/retention of bowel or bladder, no saddle anesthesia, no focal motor weakness, no tingling/numbness in extremities, no fevers, no injury, no abd pain, no rash.  The symptoms have been associated with no other complaints. The patient has a significant history of similar symptoms previously, recently being evaluated for this complaint and multiple prior evals for same.     Past Medical History:  Diagnosis Date  . Chronic back pain   . Chronic hip pain   . Chronic nausea   . Depression   . Diabetes mellitus   . Diverticulitis   . Gastritis   . H/O kidney transplant   . Hypertension   . Kidney stone   . Renal disorder   . Urinary retention     Patient Active Problem List   Diagnosis Date Noted  . Chest pain 03/20/2016    Past Surgical History:  Procedure Laterality Date  . COLONOSCOPY  05/27/2003   LKG:[id number] terminal ileum,rectum, and colon  . HERNIA REPAIR    . HIP SURGERY    . KIDNEY TRANSPLANT    . NEPHRECTOMY TRANSPLANTED ORGAN          Home Medications    Prior to Admission medications   Medication Sig Start Date End Date Taking? Authorizing Provider  acetaminophen (TYLENOL) 500 MG tablet Take 1,000 mg by mouth daily as needed for pain.     [provider]  albuterol (PROVENTIL HFA;VENTOLIN HFA) 108 (90 Base) MCG/ACT inhaler Inhale 1-2 puffs into the lungs every 6 (six) hours as needed for wheezing or shortness of breath. 07/27/18   Triplett, Tammy, PA-C  atorvastatin (LIPITOR) 10 MG tablet Take 10 mg by mouth every morning.     [provider]  benzonatate (TESSALON) 100 MG capsule Take 2 capsules (200 mg total) by mouth 3 (three) times daily as needed for cough. Swallow whole, do not chew 07/27/18   Triplett, Tammy, PA-C  cycloSPORINE (SANDIMMUNE) 100 MG capsule Take 100 mg by mouth 2 (two) times daily. Take with 25 mg tablet to equal 125 mg twice daily    [provider]  cycloSPORINE (SANDIMMUNE) 25 MG capsule Take 25 mg by mouth 2 (two) times daily. Take with 100 mg tablet to equal dose of 125 mg twice daily    [provider]  esomeprazole (NEXIUM) 40 MG capsule Take 40 mg by mouth 2 (two) times daily.     [provider]  iron polysaccharides (NIFEREX) 150 MG capsule Take 150 mg by mouth daily. 12/23/14   [provider]  ondansetron (ZOFRAN) 4 MG tablet Take 1 tablet (4 mg total) by mouth every 6 (six) hours as needed for nausea or vomiting. 06/21/18  Delora Fuel, MD  predniSONE (DELTASONE) 5 MG tablet Take 5 mg by mouth every morning.     [provider]  promethazine (PHENERGAN) 25 MG tablet Take 25 mg by mouth every 6 (six) hours as needed for nausea or vomiting.    [provider]  tamsulosin (FLOMAX) 0.4 MG CAPS capsule Take 0.4 mg by mouth daily.     [provider]  traMADol (ULTRAM) 50 MG tablet Take 1 tablet (50 mg total) by mouth every 6 (six) hours as needed. 06/24/18   [name], MD  vitamin B-12 (CYANOCOBALAMIN) 1000 MCG tablet Take 1,000 mcg by mouth daily.    [provider]  vitamin C (ASCORBIC ACID) 500 MG tablet Take 500 mg by mouth daily. 12/23/14   [provider]    Family History Family History  Problem  Relation Age of Onset  . Hypertension Sister     Social History Social History   Tobacco Use  . Smoking status: Former Smoker    Packs/day: 0.00    Years: 5.00    Pack years: 0.00    Quit date: 06/14/1972    Years since quitting: 46.5  . Smokeless tobacco: Never Used  Substance Use Topics  . Alcohol use: No  . Drug use: No     Allergies   Codeine, Enalapril maleate, Nsaids, Tape, Tolmetin, and Vasotec   Review of Systems Review of Systems ROS: Statement: All systems negative except as marked or noted in the HPI; Constitutional: Negative for fever and chills. ; ; Eyes: Negative for eye pain, redness and discharge. ; ; ENMT: Negative for ear pain, hoarseness, nasal congestion, sinus pressure and sore throat. ; ; Cardiovascular: Negative for chest pain, palpitations, diaphoresis, dyspnea and peripheral edema. ; ; Respiratory: Negative for cough, wheezing and stridor. ; ; Gastrointestinal: Negative for nausea, vomiting, diarrhea, abdominal pain, blood in stool, hematemesis, jaundice and rectal bleeding. . ; ; Genitourinary: Negative for dysuria, flank pain and hematuria. ; ; Musculoskeletal: +chronic LBP and hip pain. Negative for neck pain. Negative for swelling and trauma.; ; Skin: Negative for pruritus, rash, abrasions, blisters, bruising and skin lesion.; ; Neuro: Negative for headache, lightheadedness and neck stiffness. Negative for weakness, altered level of consciousness, altered mental status, extremity weakness, paresthesias, involuntary movement, seizure and syncope.       Physical Exam Updated Vital Signs BP 124/82 (BP Location: Right Arm)   Pulse 79   Temp 98.2 F (36.8 C) (Oral)   Resp 15   Ht 5\' 11"  (1.803 m)   SpO2 100%   BMI 25.80 kg/m   Physical Exam 0810: Physical examination:  Nursing notes reviewed; Vital signs and O2 SAT reviewed;  Constitutional: Well developed, Well nourished, Well hydrated, In no acute distress; Head:  Normocephalic, atraumatic; Eyes:  EOMI, PERRL, No scleral icterus; ENMT: Mouth and pharynx normal, Mucous membranes moist; Neck: Supple, Full range of motion, No lymphadenopathy; Cardiovascular: Regular rate and rhythm, No gallop; Respiratory: Breath sounds clear & equal bilaterally, No wheezes.  Speaking full sentences with ease, Normal respiratory effort/excursion; Chest: Nontender, Movement normal; Abdomen: Soft, Nontender, Nondistended, Normal bowel sounds; Genitourinary: No CVA tenderness; Spine:  No midline CS, TS, LS tenderness. +TTP left lumbar paraspinal muscles.;; Extremities: Peripheral pulses normal, Pelvis stable. Left hip with mild tenderness to palp, no warmth, no deformity. No edema, No calf edema or asymmetry.; Neuro: AA&Ox3, Major CN grossly intact.  Speech clear. No gross focal motor or sensory deficits in extremities. Climbs on and off stretcher easily  by himself. Gait steady.; Skin: Color normal, Warm, Dry.   ED Treatments / Results  Labs (all labs ordered are listed, but only abnormal results are displayed)   EKG None  Radiology No results found.  Procedures Procedures (including critical care time)  Medications Ordered in ED Medications  HYDROcodone-acetaminophen (NORCO/VICODIN) 5-325 MG per tablet 1 tablet (has no administration in time range)     Initial Impression / Assessment and Plan / ED Course  I have reviewed the triage vital signs and the nursing notes.  Pertinent labs & imaging results that were available during my care of the patient were reviewed by me and considered in my medical decision making (see chart for details).      MDM Reviewed: previous chart, nursing note and vitals Interpretation: x-ray     Dg Lumbar Spine Complete Result Date: 12/13/2018 CLINICAL DATA:  Lumbago EXAM: LUMBAR SPINE - COMPLETE 4+ VIEW COMPARISON:  June 20, 2014 FINDINGS: Frontal, lateral, spot lumbosacral lateral, and bilateral oblique views were obtained. There are 5 non-rib-bearing lumbar type  vertebral bodies. There is no fracture or spondylolisthesis. There is moderate disc space narrowing at L4-5, similar to prior study. Other disc spaces appear unremarkable. There are anterior osteophytes at all levels, most notably at L4. There is facet osteoarthritic change at L4-5 and L5-S1 bilaterally. There is aortic atherosclerosis. 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 No fracture or spondylolisthesis. Aortic Atherosclerosis (ICD10-I70.0). Electronically Signed   By: Lowella Grip III M.D.   On: 12/13/2018 09:24   Dg Hip Unilat With Pelvis 2-3 Views Left Result Date: 12/13/2018 CLINICAL DATA:  Pain EXAM: DG HIP (WITH OR WITHOUT PELVIS) 2-3V LEFT COMPARISON:  December 29, 2014 FINDINGS: Frontal pelvis as well as frontal and lateral left hip images were obtained. There is a total hip replacement on the left with prosthetic components well-seated. 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 There is no acute fracture or dislocation. There is stable mild narrowing of the right hip joint. No erosive change. 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 No fracture or dislocation. There is mild narrowing of the right hip joint, stable. Electronically Signed   By: Lowella Grip III M.D.   On: 12/13/2018 09:22    Robert Durham was evaluated in Emergency Department on 12/13/2018 for the symptoms described in the history of present illness. He was evaluated in the context of the global COVID-19 pandemic, which necessitated consideration that the patient might be at risk for infection with the SARS-CoV-2 virus that causes COVID-19. Institutional protocols and algorithms that pertain to the evaluation of patients at risk for COVID-19 are in a state of rapid change based on information released by regulatory bodies including the CDC and federal and  state organizations. These policies and algorithms were followed during the patient's care in the ED.   0940:  XR reassuring. PharmD called for recommendations for muscle relaxer: Ok to dose robaxin 750mg  PO TID x4 or 5 days. Pt already aware that I cannot rx him any further narcotic medications, since he ran out due to overuse. Pt verb understanding. Dx and testing, as well as incidental finding(s), d/w pt.  Questions answered.  Verb understanding, agreeable to d/c home with outpt f/u.     Final Clinical Impressions(s) / ED Diagnoses   Final diagnoses:  None    ED Discharge Orders  None       Francine Graven, DO 12/16/18 [phone]

## 2018-12-13 NOTE — ED Triage Notes (Signed)
Pt is having left sided lower back and hip pain that started 2 years ago. States he is "stiff". Ambulates with a limp. Pt had a hip replacement 4 years ago

## 2018-12-27 DIAGNOSIS — E119 Type 2 diabetes mellitus without complications: Secondary | ICD-10-CM | POA: Diagnosis not present

## 2018-12-27 DIAGNOSIS — Z0100 Encounter for examination of eyes and vision without abnormal findings: Secondary | ICD-10-CM | POA: Diagnosis not present

## 2019-01-21 ENCOUNTER — Emergency Department (HOSPITAL_COMMUNITY)
Admission: EM | Admit: 2019-01-21 | Discharge: 2019-01-21 | Disposition: A | Discharge status: Home / Self Care | Payer: Medicare Other | Attending: Emergency Medicine | Admitting: Emergency Medicine

## 2019-01-21 ENCOUNTER — Encounter (HOSPITAL_COMMUNITY): Payer: Self-pay | Admitting: Emergency Medicine

## 2019-01-21 ENCOUNTER — Other Ambulatory Visit: Payer: Self-pay

## 2019-01-21 DIAGNOSIS — Z79899 Other long term (current) drug therapy: Secondary | ICD-10-CM | POA: Insufficient documentation

## 2019-01-21 DIAGNOSIS — M5489 Other dorsalgia: Secondary | ICD-10-CM | POA: Diagnosis not present

## 2019-01-21 DIAGNOSIS — Z87891 Personal history of nicotine dependence: Secondary | ICD-10-CM | POA: Insufficient documentation

## 2019-01-21 DIAGNOSIS — E119 Type 2 diabetes mellitus without complications: Secondary | ICD-10-CM | POA: Insufficient documentation

## 2019-01-21 DIAGNOSIS — R52 Pain, unspecified: Secondary | ICD-10-CM | POA: Diagnosis not present

## 2019-01-21 DIAGNOSIS — G8929 Other chronic pain: Secondary | ICD-10-CM | POA: Insufficient documentation

## 2019-01-21 DIAGNOSIS — Z940 Kidney transplant status: Secondary | ICD-10-CM | POA: Insufficient documentation

## 2019-01-21 DIAGNOSIS — M545 Low back pain, unspecified: Secondary | ICD-10-CM

## 2019-01-21 DIAGNOSIS — I10 Essential (primary) hypertension: Secondary | ICD-10-CM | POA: Insufficient documentation

## 2019-01-21 MED ORDER — HYDROMORPHONE HCL 2 MG/ML IJ SOLN
2.0000 mg | Freq: Once | INTRAMUSCULAR | Status: AC
  Administered 2019-01-21: 2 mg via INTRAMUSCULAR
  Filled 2019-01-21: qty 1

## 2019-01-21 MED ORDER — ONDANSETRON 8 MG PO TBDP
8.0000 mg | ORAL_TABLET | Freq: Once | ORAL | Status: AC
  Administered 2019-01-21: 07:00:00 8 mg via ORAL
  Filled 2019-01-21: qty 1

## 2019-01-21 NOTE — ED Triage Notes (Signed)
Pt arrived by EMS c/o of worsening lower back pain in lumbar region x2days. Hx of kidney transplant 2 years ago.

## 2019-01-21 NOTE — ED Provider Notes (Signed)
[facility] EMERGENCY DEPARTMENT Provider Note   CSN: [id number] Arrival date & time: 01/21/19  8841    History   Chief Complaint Chief Complaint  Patient presents with  . Back Pain    HPI Robert Durham is a 63 y.o. male.     Patient with previous history of chronic back pain presents to the ER for worsening back pain.  Patient reports that for the last 2 days he has been experiencing increased pain.  He denies any injury.  Pain is bilateral in the lower back and worsens with any bending or twisting.  He has not had any leg numbness, tingling or weakness, but he does report that pain worsens when he is up and walking, causing him to walk slower than usual.  He has not had any falls.  Patient is treated with hydrocodone chronically for this back pain.  He took his last pill 1 or 2 days ago and pain has worsened since then.     Past Medical History:  Diagnosis Date  . Chronic back pain   . Chronic hip pain   . Chronic nausea   . Depression   . Diabetes mellitus   . Diverticulitis   . Gastritis   . H/O kidney transplant   . Hypertension   . Kidney stone   . Renal disorder   . Urinary retention     Patient Active Problem List   Diagnosis Date Noted  . Chest pain 03/20/2016    Past Surgical History:  Procedure Laterality Date  . COLONOSCOPY  05/27/2003   [id number] terminal ileum,rectum, and colon  . HERNIA REPAIR    . HIP SURGERY    . KIDNEY TRANSPLANT    . NEPHRECTOMY TRANSPLANTED ORGAN          Home Medications    Prior to Admission medications   Medication Sig Start Date End Date Taking? Authorizing Provider  acetaminophen (TYLENOL) 500 MG tablet Take 1,000 mg by mouth daily as needed for pain.    [provider]  albuterol (PROVENTIL HFA;VENTOLIN HFA) 108 (90 Base) MCG/ACT inhaler Inhale 1-2 puffs into the lungs every 6 (six) hours as needed for wheezing or shortness of breath. Patient not taking: Reported on 12/13/2018 07/27/18   Triplett,  Tammy, PA-C  atorvastatin (LIPITOR) 10 MG tablet Take 10 mg by mouth every morning.     [provider]  benzonatate (TESSALON) 100 MG capsule Take 2 capsules (200 mg total) by mouth 3 (three) times daily as needed for cough. Swallow whole, do not chew Patient not taking: Reported on 12/13/2018 07/27/18   Triplett, Tammy, PA-C  cycloSPORINE (SANDIMMUNE) 100 MG capsule Take 100 mg by mouth 2 (two) times daily. Take with 25 mg tablet to equal 125 mg twice daily    [provider]  cycloSPORINE (SANDIMMUNE) 25 MG capsule Take 25 mg by mouth 2 (two) times daily. Take with 100 mg tablet to equal dose of 125 mg twice daily    [provider]  esomeprazole (NEXIUM) 40 MG capsule Take 40 mg by mouth 2 (two) times daily.     [provider]  iron polysaccharides (NIFEREX) 150 MG capsule Take 150 mg by mouth daily. 12/23/14   [provider]  ondansetron (ZOFRAN) 4 MG tablet Take 1 tablet (4 mg total) by mouth every 6 (six) hours as needed for nausea or vomiting. 1/0/93   Delora Fuel, MD  predniSONE (DELTASONE) 5 MG tablet Take 5 mg by mouth every morning.  [provider]  promethazine (PHENERGAN) 25 MG tablet Take 25 mg by mouth every 6 (six) hours as needed for nausea or vomiting.    [provider]  tamsulosin (FLOMAX) 0.4 MG CAPS capsule Take 0.4 mg by mouth daily.     [provider]  traMADol (ULTRAM) 50 MG tablet Take 1 tablet (50 mg total) by mouth every 6 (six) hours as needed. 06/24/18   [name], MD  vitamin B-12 (CYANOCOBALAMIN) 1000 MCG tablet Take 1,000 mcg by mouth daily.    [provider]  vitamin C (ASCORBIC ACID) 500 MG tablet Take 500 mg by mouth daily. 12/23/14   [provider]    Family History Family History  Problem Relation Age of Onset  . Hypertension Sister     Social History Social History   Tobacco Use  . Smoking status: Former Smoker    Packs/day: 0.00    Years: 5.00     Pack years: 0.00    Quit date: 06/14/1972    Years since quitting: 46.6  . Smokeless tobacco: Never Used  Substance Use Topics  . Alcohol use: No  . Drug use: No     Allergies   Codeine, Enalapril maleate, Nsaids, Tape, Tolmetin, and Vasotec   Review of Systems Review of Systems  Musculoskeletal: Positive for back pain.  All other systems reviewed and are negative.    Physical Exam Updated Vital Signs BP 117/90 (BP Location: Left Arm)   Pulse 60   Temp 97.9 F (36.6 C) (Oral)   Resp (!) 22   Ht 5\' 11"  (1.803 m)   Wt 83.9 kg   SpO2 100%   BMI 25.80 kg/m   Physical Exam Vitals signs and nursing note reviewed.  Constitutional:      General: He is not in acute distress.    Appearance: Normal appearance. He is well-developed.  HENT:     Head: Normocephalic and atraumatic.     Right Ear: Hearing normal.     Left Ear: Hearing normal.     Nose: Nose normal.  Eyes:     Conjunctiva/sclera: Conjunctivae normal.     Pupils: Pupils are equal, round, and reactive to light.  Neck:     Musculoskeletal: Normal range of motion and neck supple.  Cardiovascular:     Rate and Rhythm: Regular rhythm.     Heart sounds: S1 normal and S2 normal. No murmur. No friction rub. No gallop.   Pulmonary:     Effort: Pulmonary effort is normal. No respiratory distress.     Breath sounds: Normal breath sounds.  Chest:     Chest wall: No tenderness.  Abdominal:     General: Bowel sounds are normal.     Palpations: Abdomen is soft.     Tenderness: There is no abdominal tenderness. There is no guarding or rebound. Negative signs include Murphy's sign and McBurney's sign.     Hernia: No hernia is present.  Musculoskeletal: Normal range of motion.     Lumbar back: He exhibits tenderness.       Back:  Skin:    General: Skin is warm and dry.     Findings: No rash.  Neurological:     Mental Status: He is alert and oriented to person, place, and time.     GCS: GCS eye subscore is 4. GCS  verbal subscore is 5. GCS motor subscore is 6.     Cranial Nerves: No cranial nerve deficit.     Sensory:  No sensory deficit.     Coordination: Coordination normal.     Deep Tendon Reflexes:     Reflex Scores:      Patellar reflexes are 1+ on the right side and 1+ on the left side.    Comments: Low back pain worsens with straight leg raises but patient has equal strength bilaterally in lower extremities.  No saddle anesthesia  No foot drop  Psychiatric:        Speech: Speech normal.        Behavior: Behavior normal.        Thought Content: Thought content normal.      ED Treatments / Results  Labs (all labs ordered are listed, but only abnormal results are displayed) Labs Reviewed - No data to display  EKG None  Radiology No results found.  Procedures Procedures (including critical care time)  Medications Ordered in ED Medications  HYDROmorphone (DILAUDID) injection 2 mg (has no administration in time range)  ondansetron (ZOFRAN-ODT) disintegrating tablet 8 mg (has no administration in time range)     Initial Impression / Assessment and Plan / ED Course  I have reviewed the triage vital signs and the nursing notes.  Pertinent labs & imaging results that were available during my care of the patient were reviewed by me and considered in my medical decision making (see chart for details).        Patient presents to the ER with musculoskeletal back pain.  He has a history of chronic back pain with similar symptoms and complaints.  Examination reveals back tenderness without any associated neurologic findings. Patient's strength, sensation and reflexes were normal. There is no evidence of saddle anesthesia. Patient does not have a foot drop. Patient has not experienced any change in bowel or bladder function. As such, patient did not require any imaging or further studies. Patient was treated with analgesia.  Final Clinical Impressions(s) / ED Diagnoses   Final  diagnoses:  Chronic bilateral low back pain without sciatica    ED Discharge Orders    None       Orpah Greek, MD 01/21/19 [phone]

## 2019-01-22 ENCOUNTER — Emergency Department (HOSPITAL_COMMUNITY)
Admission: EM | Admit: 2019-01-22 | Discharge: 2019-01-22 | Disposition: A | Discharge status: Home / Self Care | Payer: Medicare Other | Source: Home / Self Care | Attending: Emergency Medicine | Admitting: Emergency Medicine

## 2019-01-22 ENCOUNTER — Other Ambulatory Visit: Payer: Self-pay

## 2019-01-22 ENCOUNTER — Encounter (HOSPITAL_COMMUNITY): Payer: Self-pay | Admitting: *Deleted

## 2019-01-22 DIAGNOSIS — M545 Low back pain, unspecified: Secondary | ICD-10-CM

## 2019-01-22 DIAGNOSIS — R112 Nausea with vomiting, unspecified: Secondary | ICD-10-CM | POA: Diagnosis not present

## 2019-01-22 DIAGNOSIS — M25551 Pain in right hip: Secondary | ICD-10-CM | POA: Diagnosis not present

## 2019-01-22 MED ORDER — DEXAMETHASONE SODIUM PHOSPHATE 4 MG/ML IJ SOLN
4.0000 mg | Freq: Once | INTRAMUSCULAR | Status: AC
  Administered 2019-01-22: 4 mg via INTRAMUSCULAR
  Filled 2019-01-22: qty 1

## 2019-01-22 NOTE — Discharge Instructions (Addendum)
Use ice and heat for comfort.  Continue taking the muscle relaxer.  Please let Dr. Cathey Endow office know you have run out of your pain medication in the morning.

## 2019-01-22 NOTE — ED Provider Notes (Signed)
[facility] EMERGENCY DEPARTMENT Provider Note   CSN: [id number] Arrival date & time: 01/21/19  2353  Time seen 3:05 AM  History   Chief Complaint Chief Complaint  Patient presents with  . Back Pain    HPI Robert Durham is a 63 y.o. male.     HPI patient states he has had low back pain for several years.  He states he had a renal transplant and hip replacement done on the left in 1994.  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  He denies any recent injury.  He states he is never been evaluated by specialist or had back injections done.  He states the pain is around his waistline and is constant and has been present the past 2 days.  He was seen in the ED on August 9 and got a shot of Dilaudid which he states helped a little bit however had returned this evening.  He ran out of his chronic pain medicine 2 to 3 days ago.  He denies any urinary or rectal incontinence.  He does describe hard stools.  He denies any numbness of his extremities.  He was prescribed a muscle relaxer last night which she states did not help.  He denies fever or any IV drug use.  He states the pain is worse when he changes positions.  He states he is waiting for a referral to a back  specialist from his primary care doctor.  PCP Iona Beard, MD   Past Medical History:  Diagnosis Date  . Chronic back pain   . Chronic hip pain   . Chronic nausea   . Depression   . Diabetes mellitus   . Diverticulitis   . Gastritis   . H/O kidney transplant   . Hypertension   . Kidney stone   . Renal disorder   . Urinary retention     Patient Active Problem List   Diagnosis Date Noted  . Chest pain 03/20/2016    Past Surgical History:  Procedure Laterality Date  . COLONOSCOPY  05/27/2003   [id number] terminal ileum,rectum, and colon  . HERNIA REPAIR    . HIP SURGERY    . KIDNEY TRANSPLANT    . NEPHRECTOMY TRANSPLANTED ORGAN          Home Medications    Prior to Admission medications   Medication  Sig Start Date End Date Taking? Authorizing Provider  acetaminophen (TYLENOL) 500 MG tablet Take 1,000 mg by mouth daily as needed for pain.    [provider]  albuterol (PROVENTIL HFA;VENTOLIN HFA) 108 (90 Base) MCG/ACT inhaler Inhale 1-2 puffs into the lungs every 6 (six) hours as needed for wheezing or shortness of breath. Patient not taking: Reported on 12/13/2018 07/27/18   Triplett, Tammy, PA-C  atorvastatin (LIPITOR) 10 MG tablet Take 10 mg by mouth every morning.     [provider]  benzonatate (TESSALON) 100 MG capsule Take 2 capsules (200 mg total) by mouth 3 (three) times daily as needed for cough. Swallow whole, do not chew Patient not taking: Reported on 12/13/2018 07/27/18   Triplett, Tammy, PA-C  cycloSPORINE (SANDIMMUNE) 100 MG capsule Take 100 mg by mouth 2 (two) times daily. Take with 25 mg tablet to equal 125 mg twice daily    [provider]  cycloSPORINE (SANDIMMUNE) 25 MG capsule Take 25 mg by mouth 2 (two) times daily. Take with 100 mg tablet to equal dose of 125 mg twice daily  [provider]  esomeprazole (NEXIUM) 40 MG capsule Take 40 mg by mouth 2 (two) times daily.     [provider]  iron polysaccharides (NIFEREX) 150 MG capsule Take 150 mg by mouth daily. 12/23/14   [provider]  ondansetron (ZOFRAN) 4 MG tablet Take 1 tablet (4 mg total) by mouth every 6 (six) hours as needed for nausea or vomiting. 01/14/37   Delora Fuel, MD  predniSONE (DELTASONE) 5 MG tablet Take 5 mg by mouth every morning.     [provider]  promethazine (PHENERGAN) 25 MG tablet Take 25 mg by mouth every 6 (six) hours as needed for nausea or vomiting.    [provider]  tamsulosin (FLOMAX) 0.4 MG CAPS capsule Take 0.4 mg by mouth daily.     [provider]  traMADol (ULTRAM) 50 MG tablet Take 1 tablet (50 mg total) by mouth every 6 (six) hours as needed. 06/24/18   [name], MD  vitamin B-12 (CYANOCOBALAMIN)  1000 MCG tablet Take 1,000 mcg by mouth daily.    [provider]  vitamin C (ASCORBIC ACID) 500 MG tablet Take 500 mg by mouth daily. 12/23/14   [provider]    Family History Family History  Problem Relation Age of Onset  . Hypertension Sister     Social History Social History   Tobacco Use  . Smoking status: Former Smoker    Packs/day: 0.00    Years: 5.00    Pack years: 0.00    Quit date: 06/14/1972    Years since quitting: 46.6  . Smokeless tobacco: Never Used  Substance Use Topics  . Alcohol use: No  . Drug use: No     Allergies   Codeine, Enalapril maleate, Nsaids, Tape, Tolmetin, and Vasotec   Review of Systems Review of Systems  All other systems reviewed and are negative.    Physical Exam Updated Vital Signs BP (!) 150/87   Pulse 63   Temp 98.1 F (36.7 C)   Resp (!) 21   SpO2 99%   Physical Exam Vitals signs and nursing note reviewed.  Constitutional:      General: He is not in acute distress.    Appearance: Normal appearance. He is well-developed. He is not ill-appearing or toxic-appearing.  HENT:     Head: Normocephalic and atraumatic.     Right Ear: External ear normal.     Left Ear: External ear normal.     Nose: No mucosal edema.     Mouth/Throat:     Dentition: No dental abscesses.     Pharynx: No uvula swelling.  Eyes:     Extraocular Movements: Extraocular movements intact.     Conjunctiva/sclera: Conjunctivae normal.     Pupils: Pupils are equal, round, and reactive to light.  Neck:     Musculoskeletal: Full passive range of motion without pain, normal range of motion and neck supple.  Cardiovascular:     Rate and Rhythm: Normal rate and regular rhythm.     Heart sounds: Normal heart sounds. No murmur. No friction rub. No gallop.   Pulmonary:     Effort: Pulmonary effort is normal. No respiratory distress.     Breath sounds: Normal breath sounds. No wheezing, rhonchi or rales.  Chest:     Chest wall: No  tenderness or crepitus.  Musculoskeletal: Normal range of motion.        General: Tenderness present.     Comments: Moves all extremities well.  When  I have patient lay on the side and examine his back he has some mild diffuse tenderness of his mid to lower lumbar spine and the paraspinous muscles on either side but the left is worse than the right.  He has no pain with straight leg raising.  His patellar reflexes are 3+ and equal.  Skin:    General: Skin is warm and dry.     Coloration: Skin is not pale.     Findings: No erythema or rash.  Neurological:     General: No focal deficit present.     Mental Status: He is alert and oriented to person, place, and time.     Cranial Nerves: No cranial nerve deficit.  Psychiatric:        Mood and Affect: Mood normal. Mood is not anxious.        Speech: Speech normal.        Behavior: Behavior normal.        Thought Content: Thought content normal.      ED Treatments / Results  Labs (all labs ordered are listed, but only abnormal results are displayed) Labs Reviewed - No data to display  EKG None  Radiology No results found.  Procedures Procedures (including critical care time)  Medications Ordered in ED Medications  dexamethasone (DECADRON) injection 4 mg (has no administration in time range)     Initial Impression / Assessment and Plan / ED Course  I have reviewed the triage vital signs and the nursing notes.  Pertinent labs & imaging results that were available during my care of the patient were reviewed by me and considered in my medical decision making (see chart for details).      Review of the [place] shows patient has been getting #30 hydrocodone 5/325 monthly, last filled July 13.  These are prescribed by his PCP and he has been getting them for the past 2 years.  When I review patient's prior labs in February 2020 his creatinine was 1.5.  He was given Decadron IM.  I have explained to the patient that  if he runs out of his chronic pain medication it needs to be prescribed by his ordering doctor.  He was also told this his ED visit 24 hours ago.  He also was seen in July when he ran out of his pain medicine orally.  Final Clinical Impressions(s) / ED Diagnoses   Final diagnoses:  Bilateral low back pain without sciatica, unspecified chronicity    ED Discharge Orders    None     Plan discharge   Rolland Porter, MD, Barbette Or, MD 01/22/19 [phone]

## 2019-01-22 NOTE — ED Triage Notes (Signed)
Pt c/o back pain that radiates to left lower abd area that has gotten worse, pain is worse with  Movement, denies any injury, was seen in er for same yesterday,

## 2019-01-29 ENCOUNTER — Other Ambulatory Visit: Payer: Self-pay

## 2019-01-29 ENCOUNTER — Other Ambulatory Visit (HOSPITAL_COMMUNITY): Payer: Self-pay | Admitting: Family Medicine

## 2019-01-29 ENCOUNTER — Ambulatory Visit (HOSPITAL_COMMUNITY)
Admission: RE | Admit: 2019-01-29 | Discharge: 2019-01-29 | Disposition: A | Discharge status: Home / Self Care | Payer: Medicare Other | Source: Ambulatory Visit | Attending: Family Medicine | Admitting: Family Medicine

## 2019-01-29 DIAGNOSIS — M1611 Unilateral primary osteoarthritis, right hip: Secondary | ICD-10-CM | POA: Diagnosis not present

## 2019-01-29 DIAGNOSIS — M549 Dorsalgia, unspecified: Secondary | ICD-10-CM

## 2019-01-29 DIAGNOSIS — Z0000 Encounter for general adult medical examination without abnormal findings: Secondary | ICD-10-CM | POA: Diagnosis not present

## 2019-01-29 DIAGNOSIS — M25551 Pain in right hip: Secondary | ICD-10-CM | POA: Diagnosis not present

## 2019-01-29 DIAGNOSIS — M25552 Pain in left hip: Secondary | ICD-10-CM | POA: Diagnosis not present

## 2019-01-29 DIAGNOSIS — M5116 Intervertebral disc disorders with radiculopathy, lumbar region: Secondary | ICD-10-CM | POA: Diagnosis not present

## 2019-01-29 DIAGNOSIS — M545 Low back pain: Secondary | ICD-10-CM | POA: Diagnosis not present

## 2019-02-01 ENCOUNTER — Emergency Department (HOSPITAL_COMMUNITY): Payer: Medicare Other

## 2019-02-01 ENCOUNTER — Other Ambulatory Visit: Payer: Self-pay

## 2019-02-01 ENCOUNTER — Emergency Department (HOSPITAL_COMMUNITY)
Admission: EM | Admit: 2019-02-01 | Discharge: 2019-02-01 | Disposition: A | Discharge status: Home / Self Care | Payer: Medicare Other | Attending: Emergency Medicine | Admitting: Emergency Medicine

## 2019-02-01 ENCOUNTER — Encounter (HOSPITAL_COMMUNITY): Payer: Self-pay | Admitting: Emergency Medicine

## 2019-02-01 DIAGNOSIS — Z7984 Long term (current) use of oral hypoglycemic drugs: Secondary | ICD-10-CM | POA: Diagnosis not present

## 2019-02-01 DIAGNOSIS — Z885 Allergy status to narcotic agent status: Secondary | ICD-10-CM | POA: Diagnosis not present

## 2019-02-01 DIAGNOSIS — K4090 Unilateral inguinal hernia, without obstruction or gangrene, not specified as recurrent: Secondary | ICD-10-CM | POA: Diagnosis not present

## 2019-02-01 DIAGNOSIS — Z79899 Other long term (current) drug therapy: Secondary | ICD-10-CM | POA: Insufficient documentation

## 2019-02-01 DIAGNOSIS — Z886 Allergy status to analgesic agent status: Secondary | ICD-10-CM | POA: Insufficient documentation

## 2019-02-01 DIAGNOSIS — Z7952 Long term (current) use of systemic steroids: Secondary | ICD-10-CM | POA: Diagnosis not present

## 2019-02-01 DIAGNOSIS — E119 Type 2 diabetes mellitus without complications: Secondary | ICD-10-CM | POA: Insufficient documentation

## 2019-02-01 DIAGNOSIS — Z888 Allergy status to other drugs, medicaments and biological substances status: Secondary | ICD-10-CM | POA: Insufficient documentation

## 2019-02-01 DIAGNOSIS — I10 Essential (primary) hypertension: Secondary | ICD-10-CM | POA: Insufficient documentation

## 2019-02-01 DIAGNOSIS — Z8673 Personal history of transient ischemic attack (TIA), and cerebral infarction without residual deficits: Secondary | ICD-10-CM | POA: Insufficient documentation

## 2019-02-01 DIAGNOSIS — Z940 Kidney transplant status: Secondary | ICD-10-CM | POA: Insufficient documentation

## 2019-02-01 DIAGNOSIS — R102 Pelvic and perineal pain: Secondary | ICD-10-CM | POA: Diagnosis present

## 2019-02-01 DIAGNOSIS — R1084 Generalized abdominal pain: Secondary | ICD-10-CM | POA: Diagnosis not present

## 2019-02-01 DIAGNOSIS — Z87891 Personal history of nicotine dependence: Secondary | ICD-10-CM | POA: Insufficient documentation

## 2019-02-01 DIAGNOSIS — K5730 Diverticulosis of large intestine without perforation or abscess without bleeding: Secondary | ICD-10-CM | POA: Diagnosis not present

## 2019-02-01 MED ORDER — FENTANYL CITRATE (PF) 100 MCG/2ML IJ SOLN
25.0000 ug | Freq: Once | INTRAMUSCULAR | Status: AC
  Administered 2019-02-01: 25 ug via INTRAMUSCULAR
  Filled 2019-02-01: qty 2

## 2019-02-01 MED ORDER — HYDROCODONE-ACETAMINOPHEN 5-325 MG PO TABS: 1.0000 | ORAL_TABLET | Freq: Four times a day (QID) | ORAL | 0 refills | Status: DC | PRN

## 2019-02-01 MED ORDER — IOHEXOL 300 MG/ML  SOLN: 30.0000 mL | Freq: Once | INTRAMUSCULAR | Status: DC | PRN

## 2019-02-01 NOTE — ED Triage Notes (Signed)
Pt c/o of right lower groin pain x 2 weeks.  "bulge" located on his right lower groin area and pain with movement

## 2019-02-01 NOTE — ED Notes (Signed)
Patient transported to CT 

## 2019-02-01 NOTE — ED Notes (Signed)
Family at bedside. 

## 2019-02-01 NOTE — ED Provider Notes (Signed)
[facility] EMERGENCY DEPARTMENT Provider Note   CSN: [id number] Arrival date & time: 02/01/19  7846     History   Chief Complaint Chief Complaint  Patient presents with  . Groin Pain    HPI Robert Durham is a 63 y.o. male.     HPI Patient presents with concern of pain in his right suprapubic region. Onset was about 2 weeks ago without clear precipitant. Patient has multiple medical issues including prior kidney transplant in the distant past, and hernia, status post repair.  He does not remember any details about the hernia repair itself. Now, since onset 2 weeks ago patient has had persistent, worsening pain in the right suprapubic region with associated worsening swelling. Pain is transiently improved with oral narcotics. Patient has been seen and evaluated with primary care, here, and has had x-rays performed.  He is unaware of any results from these studies. No associated vomiting, diarrhea, dysuria, hematuria. Pain is particularly worse with any pressure application. Past Medical History:  Diagnosis Date  . Chronic back pain   . Chronic hip pain   . Chronic nausea   . Depression   . Diabetes mellitus   . Diverticulitis   . Gastritis   . H/O kidney transplant   . Hypertension   . Kidney stone   . Renal disorder   . Urinary retention     Patient Active Problem List   Diagnosis Date Noted  . Chest pain 03/20/2016    Past Surgical History:  Procedure Laterality Date  . COLONOSCOPY  05/27/2003   [id number] terminal ileum,rectum, and colon  . HERNIA REPAIR    . HIP SURGERY    . KIDNEY TRANSPLANT    . NEPHRECTOMY TRANSPLANTED ORGAN          Home Medications    Prior to Admission medications   Medication Sig Start Date End Date Taking? Authorizing Provider  atorvastatin (LIPITOR) 10 MG tablet Take 10 mg by mouth every morning.    Yes [provider]  cycloSPORINE (SANDIMMUNE) 100 MG capsule Take 100 mg by mouth 2 (two) times daily. Take  with 25 mg tablet to equal 125 mg twice daily   Yes [provider]  cycloSPORINE (SANDIMMUNE) 25 MG capsule Take 25 mg by mouth 2 (two) times daily. Take with 100 mg tablet to equal dose of 125 mg twice daily   Yes [provider]  esomeprazole (NEXIUM) 40 MG capsule Take 40 mg by mouth daily.    Yes [provider]  iron polysaccharides (NIFEREX) 150 MG capsule Take 150 mg by mouth daily. 12/23/14  Yes [provider]  predniSONE (DELTASONE) 5 MG tablet Take 5 mg by mouth every morning.    Yes [provider]  promethazine (PHENERGAN) 25 MG tablet Take 25 mg by mouth every 6 (six) hours as needed for nausea or vomiting.   Yes [provider]  tamsulosin (FLOMAX) 0.4 MG CAPS capsule Take 0.4 mg by mouth daily.    Yes [provider]  vitamin B-12 (CYANOCOBALAMIN) 1000 MCG tablet Take 1,000 mcg by mouth daily.   Yes [provider]  vitamin C (ASCORBIC ACID) 500 MG tablet Take 500 mg by mouth daily. 12/23/14  Yes [provider]  benzonatate (TESSALON) 100 MG capsule Take 2 capsules (200 mg total) by mouth 3 (three) times daily as needed for cough. Swallow whole, do not chew Patient not taking: Reported on 12/13/2018 07/27/18   Triplett, Tammy, PA-C  traMADol Veatrice Bourbon) 50  MG tablet Take 1 tablet (50 mg total) by mouth every 6 (six) hours as needed. Patient not taking: Reported on 02/01/2019 06/24/18   [name], MD    Family History Family History  Problem Relation Age of Onset  . Hypertension Sister     Social History Social History   Tobacco Use  . Smoking status: Former Smoker    Packs/day: 0.00    Years: 5.00    Pack years: 0.00    Quit date: 06/14/1972    Years since quitting: 46.6  . Smokeless tobacco: Never Used  Substance Use Topics  . Alcohol use: No  . Drug use: No     Allergies   Codeine, Enalapril maleate, Nsaids, Tape, Tolmetin, and Vasotec   Review of Systems Review of Systems   Constitutional:       Per HPI, otherwise negative  HENT:       Per HPI, otherwise negative  Respiratory:       Per HPI, otherwise negative  Cardiovascular:       Per HPI, otherwise negative  Gastrointestinal: Negative for vomiting.  Endocrine:       Negative aside from HPI  Genitourinary:       Neg aside from HPI   Musculoskeletal:       Per HPI, otherwise negative  Skin: Negative.   Allergic/Immunologic: Positive for immunocompromised state.  Neurological: Negative for syncope.     Physical Exam Updated Vital Signs BP (!) 115/94   Pulse (!) 52   Temp 98.1 F (36.7 C) (Oral)   Resp 16   Ht 5\' 11"  (1.803 m)   Wt 83.9 kg   SpO2 99%   BMI 25.80 kg/m   Physical Exam Vitals signs and nursing note reviewed.  Constitutional:      General: He is not in acute distress.    Appearance: He is well-developed.  HENT:     Head: Normocephalic and atraumatic.  Eyes:     Conjunctiva/sclera: Conjunctivae normal.  Cardiovascular:     Rate and Rhythm: Normal rate and regular rhythm.  Pulmonary:     Effort: Pulmonary effort is normal. No respiratory distress.     Breath sounds: No stridor.  Abdominal:     General: There is no distension.     Tenderness: There is abdominal tenderness in the suprapubic area. There is guarding.    Skin:    General: Skin is warm and dry.  Neurological:     Mental Status: He is alert and oriented to person, place, and time.      ED Treatments / Results  Labs (all labs ordered are listed, but only abnormal results are displayed) Labs Reviewed - No data to display  EKG None  Radiology Ct Abdomen Pelvis Wo Contrast  Result Date: 02/01/2019 CLINICAL DATA:  63 year old male with acute RIGHT abdominal pelvic pain for 2 weeks. Patient with renal transplant. EXAM: CT ABDOMEN AND PELVIS WITHOUT CONTRAST TECHNIQUE: Multidetector CT imaging of the abdomen and pelvis was performed following the standard protocol without IV contrast. COMPARISON:   02/18/2017 CT and prior studies FINDINGS: Please note that parenchymal abnormalities may be missed without intravenous contrast. Lower chest: No acute abnormality. Hepatobiliary: The liver and gallbladder are unremarkable except for unchanged small hepatic cysts. No biliary dilatation. Pancreas: Unremarkable Spleen: Unremarkable Adrenals/Urinary Tract: Atrophic native kidneys and LEFT pelvic renal transplant again identified. No evidence of hydronephrosis or urinary calculi. The visualized portions of the bladder are unremarkable. No adrenal abnormalities identified. Stomach/Bowel: Stomach is  within normal limits. Appendix appears normal. No evidence of bowel wall thickening, distention, or inflammatory changes. Colonic diverticulosis again noted without evidence of diverticulitis. Vascular/Lymphatic: Aortic atherosclerosis. No enlarged abdominal or pelvic lymph nodes. Reproductive: Prostate within normal limits. Other: No ascites, pneumoperitoneum or focal collection. A small to moderate RIGHT inguinal hernia containing fat is unchanged. Musculoskeletal: No acute or suspicious bony abnormalities. LEFT total hip replacement changes again noted. IMPRESSION: 1. No evidence of acute abnormality. 2. LEFT pelvic renal transplant without acute abnormality. Atrophic native kidneys. 3. Colonic diverticulosis without evidence of diverticulitis. 4. Small to moderate RIGHT inguinal hernia 5.  Aortic Atherosclerosis (ICD10-I70.0). Electronically Signed   By: Margarette Canada M.D.   On: 02/01/2019 14:00    Procedures Procedures (including critical care time)  Medications Ordered in ED Medications  iohexol (OMNIPAQUE) 300 MG/ML solution 30 mL (has no administration in time range)  fentaNYL (SUBLIMAZE) injection 25 mcg (25 mcg Intramuscular Given 02/01/19 0952)     Initial Impression / Assessment and Plan / ED Course  I have reviewed the triage vital signs and the nursing notes.  Pertinent labs & imaging results that  were available during my care of the patient were reviewed by me and considered in my medical decision making (see chart for details).        2:21 PM Patient in no distress on repeat exam, awake, alert, speaking clearly. Discussed all findings including CT evidence for hernia with no incarcerated features. Absent distress, and with reassuring scan, patient is appropriate for close outpatient follow-up to which he is amenable.   Final Clinical Impressions(s) / ED Diagnoses  Suprapubic abdominal pain Inguinal hernia, right   Carmin Muskrat, MD 02/01/19 1425

## 2019-02-01 NOTE — Discharge Instructions (Signed)
As discussed, your evaluation today has been largely reassuring.  But, it is important that you monitor your condition carefully, and do not hesitate to return to the ED if you develop new, or concerning changes in your condition. ? ?Otherwise, please follow-up with your physician for appropriate ongoing care. ? ?

## 2019-02-01 NOTE — ED Notes (Signed)
Pt back from CT

## 2019-02-06 DIAGNOSIS — K4090 Unilateral inguinal hernia, without obstruction or gangrene, not specified as recurrent: Secondary | ICD-10-CM | POA: Diagnosis not present

## 2019-02-15 ENCOUNTER — Ambulatory Visit (INDEPENDENT_AMBULATORY_CARE_PROVIDER_SITE_OTHER): Payer: Medicare Other | Admitting: General Surgery

## 2019-02-15 ENCOUNTER — Encounter: Payer: Self-pay | Admitting: General Surgery

## 2019-02-15 ENCOUNTER — Other Ambulatory Visit: Payer: Self-pay

## 2019-02-15 VITALS — BP 116/83 | HR 77 | Temp 96.9°F | Resp 16 | Ht 71.0 in | Wt 182.0 lb

## 2019-02-15 DIAGNOSIS — K4090 Unilateral inguinal hernia, without obstruction or gangrene, not specified as recurrent: Secondary | ICD-10-CM | POA: Diagnosis not present

## 2019-02-15 NOTE — Progress Notes (Signed)
Robert Durham; [id number]; 04-17-1956   HPI Patient is a 63 year old black male who was referred to my care by the emergency room and Dr. Berdine Addison for evaluation treatment of a right inguinal hernia.  Patient states that is been present for over 6 weeks.  It is made worse with straining.  He has a pain level of 8 out of 10 when the hernia is sticking out.  He denies any nausea or vomiting.  His history is significant for a kidney transplant in 1994.  He has contacted his nephrologist who is aware of him being referred to my care for the right inguinal hernia. Past Medical History:  Diagnosis Date  . Chronic back pain   . Chronic hip pain   . Chronic nausea   . Depression   . Diabetes mellitus   . Diverticulitis   . Gastritis   . H/O kidney transplant   . Hypertension   . Kidney stone   . Renal disorder   . Urinary retention     Past Surgical History:  Procedure Laterality Date  . COLONOSCOPY  05/27/2003   [id number] terminal ileum,rectum, and colon  . HERNIA REPAIR    . HIP SURGERY    . KIDNEY TRANSPLANT    . NEPHRECTOMY TRANSPLANTED ORGAN      Family History  Problem Relation Age of Onset  . Hypertension Sister     Current Outpatient Medications on File Prior to Visit  Medication Sig Dispense Refill  . atorvastatin (LIPITOR) 10 MG tablet Take 10 mg by mouth every morning.     . benzonatate (TESSALON) 100 MG capsule Take 2 capsules (200 mg total) by mouth 3 (three) times daily as needed for cough. Swallow whole, do not chew 21 capsule 0  . cycloSPORINE (SANDIMMUNE) 100 MG capsule Take 100 mg by mouth 2 (two) times daily. Take with 25 mg tablet to equal 125 mg twice daily    . cycloSPORINE (SANDIMMUNE) 25 MG capsule Take 25 mg by mouth 2 (two) times daily. Take with 100 mg tablet to equal dose of 125 mg twice daily    . esomeprazole (NEXIUM) 40 MG capsule Take 40 mg by mouth daily.     Marland Kitchen HYDROcodone-acetaminophen (NORCO/VICODIN) 5-325 MG tablet Take 1 tablet by mouth every 6  (six) hours as needed for severe pain. 15 tablet 0  . iron polysaccharides (NIFEREX) 150 MG capsule Take 150 mg by mouth daily.    . predniSONE (DELTASONE) 5 MG tablet Take 5 mg by mouth every morning.     . promethazine (PHENERGAN) 25 MG tablet Take 25 mg by mouth every 6 (six) hours as needed for nausea or vomiting.    . tamsulosin (FLOMAX) 0.4 MG CAPS capsule Take 0.4 mg by mouth daily.     . traMADol (ULTRAM) 50 MG tablet Take 1 tablet (50 mg total) by mouth every 6 (six) hours as needed. 20 tablet 0  . vitamin B-12 (CYANOCOBALAMIN) 1000 MCG tablet Take 1,000 mcg by mouth daily.    . vitamin C (ASCORBIC ACID) 500 MG tablet Take 500 mg by mouth daily.     No current facility-administered medications on file prior to visit.     Allergies  Allergen Reactions  . Codeine Itching, Nausea And Vomiting and Other (See Comments)  . Enalapril Maleate Other (See Comments)  . Nsaids Other (See Comments)    Stomach pain  . Tape Other (See Comments)    Tears skin Paper tape preferred  . Tolmetin Other (See  Comments)    Stomach pain  . Vasotec Itching and Nausea And Vomiting    Social History   Substance and Sexual Activity  Alcohol Use No    Social History   Tobacco Use  Smoking Status Former Smoker  . Packs/day: 0.00  . Years: 5.00  . Pack years: 0.00  . Quit date: 06/14/1972  . Years since quitting: 46.7  Smokeless Tobacco Never Used    Review of Systems  Constitutional: Negative.   HENT: Negative.   Eyes: Negative.   Respiratory: Negative.   Cardiovascular: Negative.   Gastrointestinal: Positive for abdominal pain.  Genitourinary: Negative.   Musculoskeletal: Positive for back pain, joint pain and neck pain.  Skin: Negative.   Neurological: Negative.   Endo/Heme/Allergies: Negative.   Psychiatric/Behavioral: Negative.     Objective   Vitals:   02/15/19 1045  BP: 116/83  Pulse: 77  Resp: 16  Temp: (!) 96.9 F (36.1 C)  SpO2: 98%    Physical Exam Vitals  signs reviewed.  Constitutional:      Appearance: Normal appearance. He is normal weight. He is not ill-appearing.  HENT:     Head: Normocephalic and atraumatic.  Cardiovascular:     Rate and Rhythm: Normal rate and regular rhythm.     Heart sounds: Normal heart sounds. No murmur. No friction rub. No gallop.   Pulmonary:     Effort: Pulmonary effort is normal. No respiratory distress.     Breath sounds: Normal breath sounds. No stridor. No wheezing, rhonchi or rales.  Abdominal:     General: Abdomen is flat. Bowel sounds are normal. There is no distension.     Palpations: Abdomen is soft. There is no mass.     Tenderness: There is no abdominal tenderness. There is no guarding or rebound.     Hernia: A hernia is present.     Comments: Easily reducible right inguinal hernia.  Surgical transplant scar noted in the left lower quadrant of the abdomen.  Genitourinary:    Comments: Genitourinary examination is within normal limits. Skin:    General: Skin is warm and dry.  Neurological:     Mental Status: He is alert and oriented to person, place, and time.   ER notes reviewed  Assessment  Right inguinal hernia, status post kidney transplant in remote past Plan   Patient states he did contact the nephrologist who was okay with him undergoing surgery for repair of his right inguinal hernia.  Patient is scheduled for right inguinal herniorrhaphy with mesh on 03/02/2019.  The risks and benefits of the procedure including bleeding, infection, mesh use, and the possibility of recurrence of the hernia were fully explained to the patient, who gave informed consent.

## 2019-02-15 NOTE — H&P (Signed)
Robert Durham; [id number]; 1955-11-24   HPI Patient is a 63 year old black male who was referred to my care by the emergency room and Dr. Berdine Addison for evaluation treatment of a right inguinal hernia.  Patient states that is been present for over 6 weeks.  It is made worse with straining.  He has a pain level of 8 out of 10 when the hernia is sticking out.  He denies any nausea or vomiting.  His history is significant for a kidney transplant in 1994.  He has contacted his nephrologist who is aware of him being referred to my care for the right inguinal hernia. Past Medical History:  Diagnosis Date  . Chronic back pain   . Chronic hip pain   . Chronic nausea   . Depression   . Diabetes mellitus   . Diverticulitis   . Gastritis   . H/O kidney transplant   . Hypertension   . Kidney stone   . Renal disorder   . Urinary retention     Past Surgical History:  Procedure Laterality Date  . COLONOSCOPY  05/27/2003   [id number] terminal ileum,rectum, and colon  . HERNIA REPAIR    . HIP SURGERY    . KIDNEY TRANSPLANT    . NEPHRECTOMY TRANSPLANTED ORGAN      Family History  Problem Relation Age of Onset  . Hypertension Sister     Current Outpatient Medications on File Prior to Visit  Medication Sig Dispense Refill  . atorvastatin (LIPITOR) 10 MG tablet Take 10 mg by mouth every morning.     . benzonatate (TESSALON) 100 MG capsule Take 2 capsules (200 mg total) by mouth 3 (three) times daily as needed for cough. Swallow whole, do not chew 21 capsule 0  . cycloSPORINE (SANDIMMUNE) 100 MG capsule Take 100 mg by mouth 2 (two) times daily. Take with 25 mg tablet to equal 125 mg twice daily    . cycloSPORINE (SANDIMMUNE) 25 MG capsule Take 25 mg by mouth 2 (two) times daily. Take with 100 mg tablet to equal dose of 125 mg twice daily    . esomeprazole (NEXIUM) 40 MG capsule Take 40 mg by mouth daily.     Marland Kitchen HYDROcodone-acetaminophen (NORCO/VICODIN) 5-325 MG tablet Take 1 tablet by mouth every 6  (six) hours as needed for severe pain. 15 tablet 0  . iron polysaccharides (NIFEREX) 150 MG capsule Take 150 mg by mouth daily.    . predniSONE (DELTASONE) 5 MG tablet Take 5 mg by mouth every morning.     . promethazine (PHENERGAN) 25 MG tablet Take 25 mg by mouth every 6 (six) hours as needed for nausea or vomiting.    . tamsulosin (FLOMAX) 0.4 MG CAPS capsule Take 0.4 mg by mouth daily.     . traMADol (ULTRAM) 50 MG tablet Take 1 tablet (50 mg total) by mouth every 6 (six) hours as needed. 20 tablet 0  . vitamin B-12 (CYANOCOBALAMIN) 1000 MCG tablet Take 1,000 mcg by mouth daily.    . vitamin C (ASCORBIC ACID) 500 MG tablet Take 500 mg by mouth daily.     No current facility-administered medications on file prior to visit.     Allergies  Allergen Reactions  . Codeine Itching, Nausea And Vomiting and Other (See Comments)  . Enalapril Maleate Other (See Comments)  . Nsaids Other (See Comments)    Stomach pain  . Tape Other (See Comments)    Tears skin Paper tape preferred  . Tolmetin Other (See  Comments)    Stomach pain  . Vasotec Itching and Nausea And Vomiting    Social History   Substance and Sexual Activity  Alcohol Use No    Social History   Tobacco Use  Smoking Status Former Smoker  . Packs/day: 0.00  . Years: 5.00  . Pack years: 0.00  . Quit date: 06/14/1972  . Years since quitting: 46.7  Smokeless Tobacco Never Used    Review of Systems  Constitutional: Negative.   HENT: Negative.   Eyes: Negative.   Respiratory: Negative.   Cardiovascular: Negative.   Gastrointestinal: Positive for abdominal pain.  Genitourinary: Negative.   Musculoskeletal: Positive for back pain, joint pain and neck pain.  Skin: Negative.   Neurological: Negative.   Endo/Heme/Allergies: Negative.   Psychiatric/Behavioral: Negative.     Objective   Vitals:   02/15/19 1045  BP: 116/83  Pulse: 77  Resp: 16  Temp: (!) 96.9 F (36.1 C)  SpO2: 98%    Physical Exam Vitals  signs reviewed.  Constitutional:      Appearance: Normal appearance. He is normal weight. He is not ill-appearing.  HENT:     Head: Normocephalic and atraumatic.  Cardiovascular:     Rate and Rhythm: Normal rate and regular rhythm.     Heart sounds: Normal heart sounds. No murmur. No friction rub. No gallop.   Pulmonary:     Effort: Pulmonary effort is normal. No respiratory distress.     Breath sounds: Normal breath sounds. No stridor. No wheezing, rhonchi or rales.  Abdominal:     General: Abdomen is flat. Bowel sounds are normal. There is no distension.     Palpations: Abdomen is soft. There is no mass.     Tenderness: There is no abdominal tenderness. There is no guarding or rebound.     Hernia: A hernia is present.     Comments: Easily reducible right inguinal hernia.  Surgical transplant scar noted in the left lower quadrant of the abdomen.  Genitourinary:    Comments: Genitourinary examination is within normal limits. Skin:    General: Skin is warm and dry.  Neurological:     Mental Status: He is alert and oriented to person, place, and time.   ER notes reviewed  Assessment  Right inguinal hernia, status post kidney transplant in remote past Plan   Patient states he did contact the nephrologist who was okay with him undergoing surgery for repair of his right inguinal hernia.  Patient is scheduled for right inguinal herniorrhaphy with mesh on 03/02/2019.  The risks and benefits of the procedure including bleeding, infection, mesh use, and the possibility of recurrence of the hernia were fully explained to the patient, who gave informed consent.

## 2019-02-15 NOTE — Patient Instructions (Signed)
Inguinal Hernia, Adult °An inguinal hernia develops when fat or the intestines push through a weak spot in a muscle where your leg meets your lower abdomen (groin). This creates a bulge. This kind of hernia could also be: °· In your scrotum, if you are male. °· In folds of skin around your vagina, if you are male. °There are three types of inguinal hernias: °· Hernias that can be pushed back into the abdomen (are reducible). This type rarely causes pain. °· Hernias that are not reducible (are incarcerated). °· Hernias that are not reducible and lose their blood supply (are strangulated). This type of hernia requires emergency surgery. °What are the causes? °This condition is caused by having a weak spot in the muscles or tissues in the groin. This weak spot develops over time. The hernia may poke through the weak spot when you suddenly strain your lower abdominal muscles, such as when you: °· Lift a heavy object. °· Strain to have a bowel movement. Constipation can lead to straining. °· Cough. °What increases the risk? °This condition is more likely to develop in: °· Men. °· Pregnant women. °· People who: °? Are overweight. °? Work in jobs that require long periods of standing or heavy lifting. °? Have had an inguinal hernia before. °? Smoke or have lung disease. These factors can lead to long-lasting (chronic) coughing. °What are the signs or symptoms? °Symptoms may depend on the size of the hernia. Often, a small inguinal hernia has no symptoms. Symptoms of a larger hernia may include: °· A lump in the groin area. This is easier to see when standing. It might not be visible when lying down. °· Pain or burning in the groin. This may get worse when lifting, straining, or coughing. °· A dull ache or a feeling of pressure in the groin. °· In men, an unusual lump in the scrotum. °Symptoms of a strangulated inguinal hernia may include: °· A bulge in your groin that is very painful and tender to the touch. °· A bulge  that turns red or purple. °· Fever, nausea, and vomiting. °· Inability to have a bowel movement or to pass gas. °How is this diagnosed? °This condition is diagnosed based on your symptoms, your medical history, and a physical exam. Your health care provider may feel your groin area and ask you to cough. °How is this treated? °Treatment depends on the size of your hernia and whether you have symptoms. If you do not have symptoms, your health care provider may have you watch your hernia carefully and have you come in for follow-up visits. If your hernia is large or if you have symptoms, you may need surgery to repair the hernia. °Follow these instructions at home: °Lifestyle °· Avoid lifting heavy objects. °· Avoid standing for long periods of time. °· Do not use any products that contain nicotine or tobacco, such as cigarettes and e-cigarettes. If you need help quitting, ask your health care provider. °· Maintain a healthy weight. °Preventing constipation °· Take actions to prevent constipation. Constipation leads to straining with bowel movements, which can make a hernia worse or cause a hernia repair to break down. Your health care provider may recommend that you: °? Drink enough fluid to keep your urine pale yellow. °? Eat foods that are high in fiber, such as fresh fruits and vegetables, whole grains, and beans. °? Limit foods that are high in fat and processed sugars, such as fried or sweet foods. °? Take an over-the-counter   or prescription medicine for constipation. °General instructions °· You may try to push the hernia back in place by very gently pressing on it while lying down. Do not try to force the bulge back in if it will not push in easily. °· Watch your hernia for any changes in shape, size, or color. Get help right away if you notice any changes. °· Take over-the-counter and prescription medicines only as told by your health care provider. °· Keep all follow-up visits as told by your health care  provider. This is important. °Contact a health care provider if: °· You have a fever. °· You develop new symptoms. °· Your symptoms get worse. °Get help right away if: °· You have pain in your groin that suddenly gets worse. °· You have a bulge in your groin that: °? Suddenly gets bigger and does not get smaller. °? Becomes red or purple or painful to the touch. °· You are a man and you have a sudden pain in your scrotum, or the size of your scrotum suddenly changes. °· You cannot push the hernia back in place by very gently pressing on it when you are lying down. Do not try to force the bulge back in if it will not push in easily. °· You have nausea or vomiting that does not go away. °· You have a fast heartbeat. °· You cannot have a bowel movement or pass gas. °These symptoms may represent a serious problem that is an emergency. Do not wait to see if the symptoms will go away. Get medical help right away. Call your local emergency services (911 in the [place] °Summary °· An inguinal hernia develops when fat or the intestines push through a weak spot in a muscle where your leg meets your lower abdomen (groin). °· This condition is caused by having a weak spot in muscles or tissue in your groin. °· Symptoms may depend on the size of the hernia, and they may include pain or swelling in your groin. A small inguinal hernia often has no symptoms. °· Treatment may not be needed if you do not have symptoms. If you have symptoms or a large hernia, you may need surgery to repair the hernia. °· Avoid lifting heavy objects. Also avoid standing for long amounts of time. °This information is not intended to replace advice given to you by your health care provider. Make sure you discuss any questions you have with your health care provider. °Document Released: 10/17/2008 Document Revised: 07/02/2017 Document Reviewed: 03/02/2017 °Elsevier Patient Education © 2020 [employer] ° °

## 2019-02-20 DIAGNOSIS — I10 Essential (primary) hypertension: Secondary | ICD-10-CM | POA: Diagnosis not present

## 2019-02-27 NOTE — Patient Instructions (Signed)
Robert Durham  02/27/2019     @PREFPERIOPPHARMACY @   Your procedure is scheduled on  03/02/2019.  Report to [facility] at  [phone]  A.M.  Call this number if you have problems the morning of surgery:  [phone]   Remember:  Do not eat or drink after midnight.                         Take these medicines the morning of surgery with A SIP OF WATER  Cyclosporine, nexium, hydrocodone(if needed), phenergan(if needed), flomax.    Do not wear jewelry, make-up or nail polish.  Do not wear lotions, powders, or perfumes. Please wear deodorant and brush your teeth.  Do not shave 48 hours prior to surgery.  Men may shave face and neck.  Do not bring valuables to the hospital.  [facility] is not responsible for any belongings or valuables.  Contacts, dentures or bridgework may not be worn into surgery.  Leave your suitcase in the car.  After surgery it may be brought to your room.  For patients admitted to the hospital, discharge time will be determined by your treatment team.  Patients discharged the day of surgery will not be allowed to drive home.   Name and phone number of your driver:   family Special instructions:  None  Please read over the following fact sheets that you were given. Anesthesia Post-op Instructions and Care and Recovery After Surgery       Open Hernia Repair, Adult, Care After These instructions give you information about caring for yourself after your procedure. Your doctor may also give you more specific instructions. If you have problems or questions, contact your doctor. Follow these instructions at home: Surgical cut (incision) care   Follow instructions from your doctor about how to take care of your surgical cut area. Make sure you: ? Wash your hands with soap and water before you change your bandage (dressing). If you cannot use soap and water, use hand sanitizer. ? Change your bandage as told by your doctor. ? Leave stitches (sutures),  skin glue, or skin tape (adhesive) strips in place. They may need to stay in place for 2 weeks or longer. If tape strips get loose and curl up, you may trim the loose edges. Do not remove tape strips completely unless your doctor says it is okay.  Check your surgical cut every day for signs of infection. Check for: ? More redness, swelling, or pain. ? More fluid or blood. ? Warmth. ? Pus or a bad smell. Activity  Do not drive or use heavy machinery while taking prescription pain medicine. Do not drive until your doctor says it is okay.  Until your doctor says it is okay: ? Do not lift anything that is heavier than 10 lb (4.5 kg). ? Do not play contact sports.  Return to your normal activities as told by your doctor. Ask your doctor what activities are safe. General instructions  To prevent or treat having a hard time pooping (constipation) while you are taking prescription pain medicine, your doctor may recommend that you: ? Drink enough fluid to keep your pee (urine) clear or pale yellow. ? Take over-the-counter or prescription medicines. ? Eat foods that are high in fiber, such as fresh fruits and vegetables, whole grains, and beans. ? Limit foods that are high in fat and processed sugars, such as fried and sweet foods.  Take  over-the-counter and prescription medicines only as told by your doctor.  Do not take baths, swim, or use a hot tub until your doctor says it is okay.  Keep all follow-up visits as told by your doctor. This is important. Contact a doctor if:  You develop a rash.  You have more redness, swelling, or pain around your surgical cut.  You have more fluid or blood coming from your surgical cut.  Your surgical cut feels warm to the touch.  You have pus or a bad smell coming from your surgical cut.  You have a fever or chills.  You have blood in your poop (stool).  You have not pooped in 2-3 days.  Medicine does not help your pain. Get help right away  if:  You have chest pain or you are short of breath.  You feel light-headed.  You feel weak and dizzy (feel faint).  You have very bad pain.  You throw up (vomit) and your pain is worse. This information is not intended to replace advice given to you by your health care provider. Make sure you discuss any questions you have with your health care provider. Document Released: 06/21/2014 Document Revised: 09/22/2018 Document Reviewed: 11/12/2015 Elsevier Patient Education  2020 [place] Anesthesia, Adult, Care After This sheet gives you information about how to care for yourself after your procedure. Your health care provider may also give you more specific instructions. If you have problems or questions, contact your health care provider. What can I expect after the procedure? After the procedure, the following side effects are common:  Pain or discomfort at the IV site.  Nausea.  Vomiting.  Sore throat.  Trouble concentrating.  Feeling cold or chills.  Weak or tired.  Sleepiness and fatigue.  Soreness and body aches. These side effects can affect parts of the body that were not involved in surgery. Follow these instructions at home:  For at least 24 hours after the procedure:  Have a responsible adult stay with you. It is important to have someone help care for you until you are awake and alert.  Rest as needed.  Do not: ? Participate in activities in which you could fall or become injured. ? Drive. ? Use heavy machinery. ? Drink alcohol. ? Take sleeping pills or medicines that cause drowsiness. ? Make important decisions or sign legal documents. ? Take care of children on your own. Eating and drinking  Follow any instructions from your health care provider about eating or drinking restrictions.  When you feel hungry, start by eating small amounts of foods that are soft and easy to digest (bland), such as toast. Gradually return to your regular  diet.  Drink enough fluid to keep your urine pale yellow.  If you vomit, rehydrate by drinking water, juice, or clear broth. General instructions  If you have sleep apnea, surgery and certain medicines can increase your risk for breathing problems. Follow instructions from your health care provider about wearing your sleep device: ? Anytime you are sleeping, including during daytime naps. ? While taking prescription pain medicines, sleeping medicines, or medicines that make you drowsy.  Return to your normal activities as told by your health care provider. Ask your health care provider what activities are safe for you.  Take over-the-counter and prescription medicines only as told by your health care provider.  If you smoke, do not smoke without supervision.  Keep all follow-up visits as told by your health care provider. This is important.  Contact a health care provider if:  You have nausea or vomiting that does not get better with medicine.  You cannot eat or drink without vomiting.  You have pain that does not get better with medicine.  You are unable to pass urine.  You develop a skin rash.  You have a fever.  You have redness around your IV site that gets worse. Get help right away if:  You have difficulty breathing.  You have chest pain.  You have blood in your urine or stool, or you vomit blood. Summary  After the procedure, it is common to have a sore throat or nausea. It is also common to feel tired.  Have a responsible adult stay with you for the first 24 hours after general anesthesia. It is important to have someone help care for you until you are awake and alert.  When you feel hungry, start by eating small amounts of foods that are soft and easy to digest (bland), such as toast. Gradually return to your regular diet.  Drink enough fluid to keep your urine pale yellow.  Return to your normal activities as told by your health care provider. Ask your  health care provider what activities are safe for you. This information is not intended to replace advice given to you by your health care provider. Make sure you discuss any questions you have with your health care provider. Document Released: 09/06/2000 Document Revised: 06/03/2017 Document Reviewed: 01/14/2017 Elsevier Patient Education  2020 [employer].

## 2019-02-28 ENCOUNTER — Encounter (HOSPITAL_COMMUNITY): Payer: Self-pay

## 2019-02-28 ENCOUNTER — Encounter (HOSPITAL_COMMUNITY)
Admission: RE | Admit: 2019-02-28 | Discharge: 2019-02-28 | Disposition: A | Discharge status: Home / Self Care | Payer: Medicare Other | Source: Ambulatory Visit | Attending: General Surgery | Admitting: General Surgery

## 2019-02-28 ENCOUNTER — Other Ambulatory Visit (HOSPITAL_COMMUNITY)
Admission: RE | Admit: 2019-02-28 | Discharge: 2019-02-28 | Disposition: A | Discharge status: Home / Self Care | Payer: Medicare Other | Source: Ambulatory Visit | Attending: General Surgery | Admitting: General Surgery

## 2019-02-28 ENCOUNTER — Other Ambulatory Visit: Payer: Self-pay

## 2019-02-28 DIAGNOSIS — Z01812 Encounter for preprocedural laboratory examination: Secondary | ICD-10-CM | POA: Diagnosis not present

## 2019-02-28 LAB — BASIC METABOLIC PANEL
Anion gap: 8 (ref 5–15)
BUN: 15 mg/dL (ref 8–23)
CO2: 23 mmol/L (ref 22–32)
Calcium: 9.1 mg/dL (ref 8.9–10.3)
Chloride: 105 mmol/L (ref 98–111)
Creatinine, Ser: 1.33 mg/dL — ABNORMAL HIGH (ref 0.61–1.24)
GFR calc Af Amer: >60 mL/min (ref >60)
GFR calc non Af Amer: 56 mL/min — ABNORMAL LOW (ref >60)
Glucose, Bld: 104 mg/dL — ABNORMAL HIGH (ref 70–99)
Potassium: 3.6 mmol/L (ref 3.5–5.1)
Sodium: 136 mmol/L (ref 135–145)

## 2019-02-28 LAB — HEMOGLOBIN A1C
Hgb A1c MFr Bld: 6.2 % — ABNORMAL HIGH (ref 4.8–5.6)
Mean Plasma Glucose: 131.24 mg/dL

## 2019-02-28 LAB — GLUCOSE, CAPILLARY: Glucose-Capillary: 117 mg/dL — ABNORMAL HIGH (ref 70–99)

## 2019-03-01 LAB — SARS CORONAVIRUS 2 (TAT 6-24 HRS): SARS Coronavirus 2: NEGATIVE

## 2019-03-02 ENCOUNTER — Ambulatory Visit (HOSPITAL_COMMUNITY): Payer: Medicare Other | Admitting: Anesthesiology

## 2019-03-02 ENCOUNTER — Encounter (HOSPITAL_COMMUNITY): Payer: Self-pay | Admitting: *Deleted

## 2019-03-02 ENCOUNTER — Encounter (HOSPITAL_COMMUNITY)
Admission: RE | Disposition: A | Discharge status: Home / Self Care | Payer: Self-pay | Source: Home / Self Care | Attending: General Surgery

## 2019-03-02 ENCOUNTER — Ambulatory Visit (HOSPITAL_COMMUNITY)
Admission: RE | Admit: 2019-03-02 | Discharge: 2019-03-02 | Disposition: A | Discharge status: Home / Self Care | Payer: Medicare Other | Attending: General Surgery | Admitting: General Surgery

## 2019-03-02 DIAGNOSIS — I10 Essential (primary) hypertension: Secondary | ICD-10-CM | POA: Diagnosis not present

## 2019-03-02 DIAGNOSIS — E119 Type 2 diabetes mellitus without complications: Secondary | ICD-10-CM | POA: Diagnosis not present

## 2019-03-02 DIAGNOSIS — F329 Major depressive disorder, single episode, unspecified: Secondary | ICD-10-CM | POA: Insufficient documentation

## 2019-03-02 DIAGNOSIS — M25559 Pain in unspecified hip: Secondary | ICD-10-CM | POA: Diagnosis not present

## 2019-03-02 DIAGNOSIS — Z888 Allergy status to other drugs, medicaments and biological substances status: Secondary | ICD-10-CM | POA: Diagnosis not present

## 2019-03-02 DIAGNOSIS — Z87442 Personal history of urinary calculi: Secondary | ICD-10-CM | POA: Insufficient documentation

## 2019-03-02 DIAGNOSIS — Z885 Allergy status to narcotic agent status: Secondary | ICD-10-CM | POA: Insufficient documentation

## 2019-03-02 DIAGNOSIS — M1990 Unspecified osteoarthritis, unspecified site: Secondary | ICD-10-CM | POA: Diagnosis not present

## 2019-03-02 DIAGNOSIS — D176 Benign lipomatous neoplasm of spermatic cord: Secondary | ICD-10-CM | POA: Diagnosis not present

## 2019-03-02 DIAGNOSIS — Z8249 Family history of ischemic heart disease and other diseases of the circulatory system: Secondary | ICD-10-CM | POA: Insufficient documentation

## 2019-03-02 DIAGNOSIS — Z940 Kidney transplant status: Secondary | ICD-10-CM | POA: Diagnosis not present

## 2019-03-02 DIAGNOSIS — M549 Dorsalgia, unspecified: Secondary | ICD-10-CM | POA: Insufficient documentation

## 2019-03-02 DIAGNOSIS — K4090 Unilateral inguinal hernia, without obstruction or gangrene, not specified as recurrent: Secondary | ICD-10-CM | POA: Diagnosis not present

## 2019-03-02 DIAGNOSIS — Z91048 Other nonmedicinal substance allergy status: Secondary | ICD-10-CM | POA: Insufficient documentation

## 2019-03-02 DIAGNOSIS — G8929 Other chronic pain: Secondary | ICD-10-CM | POA: Diagnosis not present

## 2019-03-02 DIAGNOSIS — K219 Gastro-esophageal reflux disease without esophagitis: Secondary | ICD-10-CM | POA: Insufficient documentation

## 2019-03-02 DIAGNOSIS — Z886 Allergy status to analgesic agent status: Secondary | ICD-10-CM | POA: Diagnosis not present

## 2019-03-02 DIAGNOSIS — Z79899 Other long term (current) drug therapy: Secondary | ICD-10-CM | POA: Insufficient documentation

## 2019-03-02 DIAGNOSIS — Z8719 Personal history of other diseases of the digestive system: Secondary | ICD-10-CM | POA: Insufficient documentation

## 2019-03-02 HISTORY — PX: INGUINAL HERNIA REPAIR: SHX194

## 2019-03-02 LAB — GLUCOSE, CAPILLARY
Glucose-Capillary: 99 mg/dL (ref 70–99)
Glucose-Capillary: 89 mg/dL (ref 70–99)

## 2019-03-02 SURGERY — REPAIR, HERNIA, INGUINAL, ADULT
Anesthesia: General | Site: Groin | Laterality: Right

## 2019-03-02 MED ORDER — ROCURONIUM BROMIDE 50 MG/5ML IV SOSY
PREFILLED_SYRINGE | INTRAVENOUS | Status: DC | PRN
  Administered 2019-03-02: 20 mg via INTRAVENOUS

## 2019-03-02 MED ORDER — PROPOFOL 10 MG/ML IV BOLUS
INTRAVENOUS | Status: DC | PRN
  Administered 2019-03-02: 200 mg via INTRAVENOUS
  Administered 2019-03-02: 50 mg via INTRAVENOUS

## 2019-03-02 MED ORDER — DEXAMETHASONE SODIUM PHOSPHATE 4 MG/ML IJ SOLN
INTRAMUSCULAR | Status: DC | PRN
  Administered 2019-03-02: 10 mg via INTRAVENOUS

## 2019-03-02 MED ORDER — GLYCOPYRROLATE PF 0.2 MG/ML IJ SOSY
PREFILLED_SYRINGE | INTRAMUSCULAR | Status: DC | PRN
  Administered 2019-03-02: .2 mg via INTRAVENOUS

## 2019-03-02 MED ORDER — SUCCINYLCHOLINE CHLORIDE 20 MG/ML IJ SOLN
INTRAMUSCULAR | Status: DC | PRN
  Administered 2019-03-02: 160 mg via INTRAVENOUS

## 2019-03-02 MED ORDER — DEXAMETHASONE SODIUM PHOSPHATE 10 MG/ML IJ SOLN
INTRAMUSCULAR | Status: AC
  Filled 2019-03-02: qty 1

## 2019-03-02 MED ORDER — CEFAZOLIN SODIUM-DEXTROSE 2-4 GM/100ML-% IV SOLN
2.0000 g | INTRAVENOUS | Status: AC
  Administered 2019-03-02: 07:00:00 2 g via INTRAVENOUS

## 2019-03-02 MED ORDER — ONDANSETRON HCL 4 MG/2ML IJ SOLN
INTRAMUSCULAR | Status: DC | PRN
  Administered 2019-03-02: 4 mg via INTRAVENOUS

## 2019-03-02 MED ORDER — FENTANYL CITRATE (PF) 100 MCG/2ML IJ SOLN
INTRAMUSCULAR | Status: AC
  Filled 2019-03-02: qty 2

## 2019-03-02 MED ORDER — CHLORHEXIDINE GLUCONATE CLOTH 2 % EX PADS: 6.0000 | MEDICATED_PAD | Freq: Once | CUTANEOUS | Status: DC

## 2019-03-02 MED ORDER — HYDROMORPHONE HCL 1 MG/ML IJ SOLN
0.2500 mg | INTRAMUSCULAR | Status: DC | PRN
  Administered 2019-03-02: 0.5 mg via INTRAVENOUS
  Filled 2019-03-02: qty 0.5

## 2019-03-02 MED ORDER — 0.9 % SODIUM CHLORIDE (POUR BTL) OPTIME
TOPICAL | Status: DC | PRN
  Administered 2019-03-02: 07:00:00 1000 mL

## 2019-03-02 MED ORDER — BUPIVACAINE LIPOSOME 1.3 % IJ SUSP
INTRAMUSCULAR | Status: DC | PRN
  Administered 2019-03-02: 20 mL

## 2019-03-02 MED ORDER — PROPOFOL 10 MG/ML IV BOLUS
INTRAVENOUS | Status: AC
  Filled 2019-03-02: qty 40

## 2019-03-02 MED ORDER — LIDOCAINE 2% (20 MG/ML) 5 ML SYRINGE
INTRAMUSCULAR | Status: DC | PRN
  Administered 2019-03-02: 40 mg via INTRAVENOUS

## 2019-03-02 MED ORDER — CEFAZOLIN SODIUM-DEXTROSE 2-4 GM/100ML-% IV SOLN
INTRAVENOUS | Status: AC
  Filled 2019-03-02: qty 100

## 2019-03-02 MED ORDER — HYDROCODONE-ACETAMINOPHEN 5-325 MG PO TABS: 1.0000 | ORAL_TABLET | ORAL | 0 refills | Status: DC | PRN

## 2019-03-02 MED ORDER — ONDANSETRON HCL 4 MG/2ML IJ SOLN
INTRAMUSCULAR | Status: AC
  Filled 2019-03-02: qty 2

## 2019-03-02 MED ORDER — FENTANYL CITRATE (PF) 100 MCG/2ML IJ SOLN
INTRAMUSCULAR | Status: DC | PRN
  Administered 2019-03-02 (×4): 50 ug via INTRAVENOUS

## 2019-03-02 MED ORDER — ONDANSETRON HCL 4 MG/2ML IJ SOLN: 4.0000 mg | Freq: Once | INTRAMUSCULAR | Status: DC | PRN

## 2019-03-02 MED ORDER — BUPIVACAINE LIPOSOME 1.3 % IJ SUSP
INTRAMUSCULAR | Status: AC
  Filled 2019-03-02: qty 20

## 2019-03-02 MED ORDER — LACTATED RINGERS IV SOLN
Freq: Once | INTRAVENOUS | Status: AC
  Administered 2019-03-02: 07:00:00 via INTRAVENOUS

## 2019-03-02 MED ORDER — LACTATED RINGERS IV SOLN
INTRAVENOUS | Status: DC | PRN
  Administered 2019-03-02: 07:00:00 via INTRAVENOUS

## 2019-03-02 MED ORDER — MEPERIDINE HCL 50 MG/ML IJ SOLN: 6.2500 mg | INTRAMUSCULAR | Status: DC | PRN

## 2019-03-02 MED ORDER — SUGAMMADEX SODIUM 200 MG/2ML IV SOLN
INTRAVENOUS | Status: DC | PRN
  Administered 2019-03-02: 167.8 mg via INTRAVENOUS

## 2019-03-02 SURGICAL SUPPLY — 40 items
ADH SKN CLS APL DERMABOND .7 (GAUZE/BANDAGES/DRESSINGS) ×1
CLOTH BEACON ORANGE TIMEOUT ST (SAFETY) ×3 IMPLANT
COVER LIGHT HANDLE STERIS (MISCELLANEOUS) ×6 IMPLANT
COVER WAND RF STERILE (DRAPES) ×3 IMPLANT
DERMABOND ADVANCED (GAUZE/BANDAGES/DRESSINGS) ×2
DERMABOND ADVANCED .7 DNX12 (GAUZE/BANDAGES/DRESSINGS) ×1 IMPLANT
DRAIN PENROSE 18X1/2 LTX STRL (DRAIN) ×3 IMPLANT
ELECT REM PT RETURN 9FT ADLT (ELECTROSURGICAL) ×3
ELECTRODE REM PT RTRN 9FT ADLT (ELECTROSURGICAL) ×1 IMPLANT
GAUZE SPONGE 4X4 12PLY STRL (GAUZE/BANDAGES/DRESSINGS) ×3 IMPLANT
GLOVE BIO SURGEON STRL SZ7 (GLOVE) ×4 IMPLANT
GLOVE BIOGEL PI IND STRL 7.0 (GLOVE) ×3 IMPLANT
GLOVE BIOGEL PI INDICATOR 7.0 (GLOVE) ×6
GLOVE SURG SS PI 7.5 STRL IVOR (GLOVE) ×3 IMPLANT
GOWN STRL REUS W/TWL LRG LVL3 (GOWN DISPOSABLE) ×9 IMPLANT
INST SET MINOR GENERAL (KITS) ×3 IMPLANT
KIT TURNOVER KIT A (KITS) ×3 IMPLANT
MANIFOLD NEPTUNE II (INSTRUMENTS) ×3 IMPLANT
MESH HERNIA 1.6X1.9 PLUG LRG (Mesh General) IMPLANT
MESH HERNIA PLUG LRG (Mesh General) ×2 IMPLANT
NDL HYPO 18GX1.5 BLUNT FILL (NEEDLE) ×1 IMPLANT
NDL HYPO 21X1.5 SAFETY (NEEDLE) ×1 IMPLANT
NEEDLE HYPO 18GX1.5 BLUNT FILL (NEEDLE) ×3 IMPLANT
NEEDLE HYPO 21X1.5 SAFETY (NEEDLE) ×3 IMPLANT
NS IRRIG 1000ML POUR BTL (IV SOLUTION) ×3 IMPLANT
PACK MINOR (CUSTOM PROCEDURE TRAY) ×3 IMPLANT
PAD ARMBOARD 7.5X6 YLW CONV (MISCELLANEOUS) ×3 IMPLANT
SET BASIN LINEN APH (SET/KITS/TRAYS/PACK) ×3 IMPLANT
SOL PREP PROV IODINE SCRUB 4OZ (MISCELLANEOUS) ×3 IMPLANT
SUT MNCRL AB 4-0 PS2 18 (SUTURE) ×3 IMPLANT
SUT NOVA NAB GS-22 2 2-0 T-19 (SUTURE) ×8 IMPLANT
SUT PROLENE 2 0 SH 30 (SUTURE) IMPLANT
SUT SILK 3 0 (SUTURE)
SUT SILK 3-0 18XBRD TIE 12 (SUTURE) IMPLANT
SUT VIC AB 2-0 CT1 27 (SUTURE) ×3
SUT VIC AB 2-0 CT1 TAPERPNT 27 (SUTURE) ×1 IMPLANT
SUT VIC AB 3-0 SH 27 (SUTURE) ×3
SUT VIC AB 3-0 SH 27X BRD (SUTURE) ×1 IMPLANT
SUT VICRYL AB 3 0 TIES (SUTURE) ×2 IMPLANT
SYR 20ML LL LF (SYRINGE) ×6 IMPLANT

## 2019-03-02 NOTE — Anesthesia Preprocedure Evaluation (Signed)
Anesthesia Evaluation  Patient identified by MRN, date of birth, ID band Patient awake    Reviewed: Allergy & Precautions, NPO status , Patient's Chart, lab work & pertinent test results  History of Anesthesia Complications Negative for: history of anesthetic complications  Airway Mallampati: II  TM Distance: >3 FB Neck ROM: Full    Dental  (+) Missing, Poor Dentition, Dental Advisory Given   Pulmonary former smoker,    Pulmonary exam normal breath sounds clear to auscultation       Cardiovascular METS (he slowed down recently, before that he was active): hypertension, Pt. on medications Normal cardiovascular exam Rhythm:Regular Rate:Normal  27-Jul-2018 09:02:12 [facility] ROUTINE RECORD Sinus rhythm Abnormal R-wave progression, early transition Baseline wander in lead(s) V5   Neuro/Psych PSYCHIATRIC DISORDERS Depression    GI/Hepatic Neg liver ROS, GERD  Medicated,  Endo/Other  diabetes, Well Controlled, Type 2  Renal/GU Renal InsufficiencyRenal disease (kidney transplant)  negative genitourinary   Musculoskeletal  (+) Arthritis  (ch back pain, hip pain),   Abdominal   Peds negative pediatric ROS (+)  Hematology negative hematology ROS (+)   Anesthesia Other Findings   Reproductive/Obstetrics                             Anesthesia Physical Anesthesia Plan  ASA: III  Anesthesia Plan: General   Post-op Pain Management:    Induction: Intravenous  PONV Risk Score and Plan: Ondansetron, Dexamethasone and Metaclopromide  Airway Management Planned: Oral ETT  Additional Equipment:   Intra-op Plan:   Post-operative Plan:   Informed Consent: I have reviewed the patients History and Physical, chart, labs and discussed the procedure including the risks, benefits and alternatives for the proposed anesthesia with the patient or authorized representative who has  indicated his/her understanding and acceptance.     Dental advisory given  Plan Discussed with: CRNA  Anesthesia Plan Comments:         Anesthesia Quick Evaluation

## 2019-03-02 NOTE — Transfer of Care (Signed)
Immediate Anesthesia Transfer of Care Note  Patient: Robert Durham  Procedure(s) Performed: HERNIA REPAIR INGUINAL ADULT WITH MESH (Right Groin)  Patient Location: PACU  Anesthesia Type:General  Level of Consciousness: drowsy and pateint uncooperative  Airway & Oxygen Therapy: Patient Spontanous Breathing  Post-op Assessment: Report given to RN and Post -op Vital signs reviewed and stable  Post vital signs: Reviewed and stable  Last Vitals:  Vitals Value Taken Time  BP 122/79 03/02/19 0815  Temp    Pulse 75 03/02/19 0821  Resp 25 03/02/19 0821  SpO2 97 % 03/02/19 0821  Vitals shown include unvalidated device data.  Last Pain:  Vitals:   03/02/19 0651  TempSrc: Oral  PainSc: 0-No pain      Patients Stated Pain Goal: 5 ([id number])  Complications: No apparent anesthesia complications

## 2019-03-02 NOTE — Interval H&P Note (Signed)
History and Physical Interval Note:  03/02/2019 7:12 AM  Robert Durham  has presented today for surgery, with the diagnosis of right inguinal hernia.  The various methods of treatment have been discussed with the patient and family. After consideration of risks, benefits and other options for treatment, the patient has consented to  Procedure(s): HERNIA REPAIR INGUINAL ADULT WITH MESH (Right) as a surgical intervention.  The patient's history has been reviewed, patient examined, no change in status, stable for surgery.  I have reviewed the patient's chart and labs.  Questions were answered to the patient's satisfaction.     Aviva Signs

## 2019-03-02 NOTE — Anesthesia Procedure Notes (Signed)
Procedure Name: Intubation Date/Time: 03/02/2019 7:25 AM Performed by: Andree Elk, Virgie Kunda A, CRNA Pre-anesthesia Checklist: Patient identified, Patient being monitored, Timeout performed, Emergency Drugs available and Suction available Patient Re-evaluated:Patient Re-evaluated prior to induction Oxygen Delivery Method: Circle system utilized Preoxygenation: Pre-oxygenation with 100% oxygen Induction Type: IV induction Laryngoscope Size: 3 and Miller Grade View: Grade I Tube type: Oral Tube size: 7.0 mm Number of attempts: 1 Airway Equipment and Method: Stylet Placement Confirmation: ETT inserted through vocal cords under direct vision,  positive ETCO2 and breath sounds checked- equal and bilateral Secured at: 21 cm Tube secured with: Tape Dental Injury: Teeth and Oropharynx as per pre-operative assessment

## 2019-03-02 NOTE — Anesthesia Postprocedure Evaluation (Signed)
Anesthesia Post Note  Patient: Robert Durham  Procedure(s) Performed: HERNIA REPAIR INGUINAL ADULT WITH MESH (Right Groin)  Patient location during evaluation: PACU Anesthesia Type: General Level of consciousness: awake and alert, oriented and patient cooperative Pain management: pain level controlled Vital Signs Assessment: post-procedure vital signs reviewed and stable Respiratory status: spontaneous breathing Cardiovascular status: stable Postop Assessment: no apparent nausea or vomiting Anesthetic complications: no     Last Vitals:  Vitals:   03/02/19 0830 03/02/19 0845  BP: 112/69   Pulse: 75 73  Resp: 20 19  Temp:    SpO2: 96% 98%    Last Pain:  Vitals:   03/02/19 0651  TempSrc: Oral  PainSc: 0-No pain                 Bernarr Longsworth A

## 2019-03-02 NOTE — Discharge Instructions (Signed)
Open Hernia Repair, Adult, Care After °This sheet gives you information about how to care for yourself after your procedure. Your health care provider may also give you more specific instructions. If you have problems or questions, contact your health care provider. °What can I expect after the procedure? °After the procedure, it is common to have: °· Mild discomfort. °· Slight bruising. °· Minor swelling. °· Pain in the abdomen. °Follow these instructions at home: °Incision care ° °· Follow instructions from your health care provider about how to take care of your incision area. Make sure you: °? Wash your hands with soap and water before you change your bandage (dressing). If soap and water are not available, use hand sanitizer. °? Change your dressing as told by your health care provider. °? Leave stitches (sutures), skin glue, or adhesive strips in place. These skin closures may need to stay in place for 2 weeks or longer. If adhesive strip edges start to loosen and curl up, you may trim the loose edges. Do not remove adhesive strips completely unless your health care provider tells you to do that. °· Check your incision area every day for signs of infection. Check for: °? More redness, swelling, or pain. °? More fluid or blood. °? Warmth. °? Pus or a bad smell. °Activity °· Do not drive or use heavy machinery while taking prescription pain medicine. Do not drive until your health care provider approves. °· Until your health care provider approves: °? Do not lift anything that is heavier than 10 lb (4.5 kg). °? Do not play contact sports. °· Return to your normal activities as told by your health care provider. Ask your health care provider what activities are safe. °General instructions °· To prevent or treat constipation while you are taking prescription pain medicine, your health care provider may recommend that you: °? Drink enough fluid to keep your urine clear or pale yellow. °? Take over-the-counter or  prescription medicines. °? Eat foods that are high in fiber, such as fresh fruits and vegetables, whole grains, and beans. °? Limit foods that are high in fat and processed sugars, such as fried and sweet foods. °· Take over-the-counter and prescription medicines only as told by your health care provider. °· Do not take tub baths or go swimming until your health care provider approves. °· Keep all follow-up visits as told by your health care provider. This is important. °Contact a health care provider if: °· You develop a rash. °· You have more redness, swelling, or pain around your incision. °· You have more fluid or blood coming from your incision. °· Your incision feels warm to the touch. °· You have pus or a bad smell coming from your incision. °· You have a fever or chills. °· You have blood in your stool (feces). °· You have not had a bowel movement in 2-3 days. °· Your pain is not controlled with medicine. °Get help right away if: °· You have chest pain or shortness of breath. °· You feel light-headed or feel faint. °· You have severe pain. °· You vomit and your pain is worse. °This information is not intended to replace advice given to you by your health care provider. Make sure you discuss any questions you have with your health care provider. °Document Released: 12/18/2004 Document Revised: 05/13/2017 Document Reviewed: 11/12/2015 °Elsevier Patient Education © 2020 [employer] ° ° ° ° °General Anesthesia, Adult, Care After °This sheet gives you information about how to care for   your procedure. Your health care provider may also give you more specific instructions. If you have problems or questions, contact your health care provider. °What can I expect after the procedure? °After the procedure, the following side effects are common: °· Pain or discomfort at the IV site. °· Nausea. °· Vomiting. °· Sore throat. °· Trouble concentrating. °· Feeling cold or chills. °· Weak or  tired. °· Sleepiness and fatigue. °· Soreness and body aches. These side effects can affect parts of the body that were not involved in surgery. °Follow these instructions at home: ° °For at least 24 hours after the procedure: °· Have a responsible adult stay with you. It is important to have someone help care for you until you are awake and alert. °· Rest as needed. °· Do not: °? Participate in activities in which you could fall or become injured. °? Drive. °? Use heavy machinery. °? Drink alcohol. °? Take sleeping pills or medicines that cause drowsiness. °? Make important decisions or sign legal documents. °? Take care of children on your own. °Eating and drinking °· Follow any instructions from your health care provider about eating or drinking restrictions. °· When you feel hungry, start by eating small amounts of foods that are soft and easy to digest (bland), such as toast. Gradually return to your regular diet. °· Drink enough fluid to keep your urine pale yellow. °· If you vomit, rehydrate by drinking water, juice, or clear broth. °General instructions °· If you have sleep apnea, surgery and certain medicines can increase your risk for breathing problems. Follow instructions from your health care provider about wearing your sleep device: °? Anytime you are sleeping, including during daytime naps. °? While taking prescription pain medicines, sleeping medicines, or medicines that make you drowsy. °· Return to your normal activities as told by your health care provider. Ask your health care provider what activities are safe for you. °· Take over-the-counter and prescription medicines only as told by your health care provider. °· If you smoke, do not smoke without supervision. °· Keep all follow-up visits as told by your health care provider. This is important. °Contact a health care provider if: °· You have nausea or vomiting that does not get better with medicine. °· You cannot eat or drink without  vomiting. °· You have pain that does not get better with medicine. °· You are unable to pass urine. °· You develop a skin rash. °· You have a fever. °· You have redness around your IV site that gets worse. °Get help right away if: °· You have difficulty breathing. °· You have chest pain. °· You have blood in your urine or stool, or you vomit blood. °Summary °· After the procedure, it is common to have a sore throat or nausea. It is also common to feel tired. °· Have a responsible adult stay with you for the first 24 hours after general anesthesia. It is important to have someone help care for you until you are awake and alert. °· When you feel hungry, start by eating small amounts of foods that are soft and easy to digest (bland), such as toast. Gradually return to your regular diet. °· Drink enough fluid to keep your urine pale yellow. °· Return to your normal activities as told by your health care provider. Ask your health care provider what activities are safe for you. °This information is not intended to replace advice given to you by your health care provider. Make sure you discuss   sure you discuss any questions you have with your health care provider. Document Released: 09/06/2000 Document Revised: 06/03/2017 Document Reviewed: 01/14/2017 Elsevier Patient Education  2020 [place] Anesthesia, Adult, Care After This sheet gives you information about how to care for yourself after your procedure. Your health care provider may also give you more specific instructions. If you have problems or questions, contact your health care provider. What can I expect after the procedure? After the procedure, the following side effects are common:  Pain or discomfort at the IV site.  Nausea.  Vomiting.  Sore throat.  Trouble concentrating.  Feeling cold or chills.  Weak or tired.  Sleepiness and fatigue.  Soreness and body aches. These side effects can affect parts of the body that were not involved in  surgery. Follow these instructions at home:  For at least 24 hours after the procedure:  Have a responsible adult stay with you. It is important to have someone help care for you until you are awake and alert.  Rest as needed.  Do not: ? Participate in activities in which you could fall or become injured. ? Drive. ? Use heavy machinery. ? Drink alcohol. ? Take sleeping pills or medicines that cause drowsiness. ? Make important decisions or sign legal documents. ? Take care of children on your own. Eating and drinking  Follow any instructions from your health care provider about eating or drinking restrictions.  When you feel hungry, start by eating small amounts of foods that are soft and easy to digest (bland), such as toast. Gradually return to your regular diet.  Drink enough fluid to keep your urine pale yellow.  If you vomit, rehydrate by drinking water, juice, or clear broth. General instructions  If you have sleep apnea, surgery and certain medicines can increase your risk for breathing problems. Follow instructions from your health care provider about wearing your sleep device: ? Anytime you are sleeping, including during daytime naps. ? While taking prescription pain medicines, sleeping medicines, or medicines that make you drowsy.  Return to your normal activities as told by your health care provider. Ask your health care provider what activities are safe for you.  Take over-the-counter and prescription medicines only as told by your health care provider.  If you smoke, do not smoke without supervision.  Keep all follow-up visits as told by your health care provider. This is important. Contact a health care provider if:  You have nausea or vomiting that does not get better with medicine.  You cannot eat or drink without vomiting.  You have pain that does not get better with medicine.  You are unable to pass urine.  You develop a skin rash.  You have a  fever.  You have redness around your IV site that gets worse. Get help right away if:  You have difficulty breathing.  You have chest pain.  You have blood in your urine or stool, or you vomit blood. Summary  After the procedure, it is common to have a sore throat or nausea. It is also common to feel tired.  Have a responsible adult stay with you for the first 24 hours after general anesthesia. It is important to have someone help care for you until you are awake and alert.  When you feel hungry, start by eating small amounts of foods that are soft and easy to digest (bland), such as toast. Gradually return to your regular diet.  Drink enough fluid to keep your  urine pale yellow.  Return to your normal activities as told by your health care provider. Ask your health care provider what activities are safe for you. This information is not intended to replace advice given to you by your health care provider. Make sure you discuss any questions you have with your health care provider. Document Released: 09/06/2000 Document Revised: 06/03/2017 Document Reviewed: 01/14/2017 Elsevier Patient Education  2020 [employer].

## 2019-03-02 NOTE — Op Note (Signed)
Patient:  Robert Durham  DOB:  February 09, 1956  MRN:  [id number]   Preop Diagnosis: Right inguinal hernia  Postop Diagnosis: Same  Procedure: Right inguinal herniorrhaphy with mesh  Surgeon: Aviva Signs, MD  Anes: General  Indications: Patient is a 63 year old black male who presents with a symptomatic right inguinal hernia.  The risks and benefits of the procedure including bleeding, infection, mesh use, and the possibility of recurrence of the hernia were fully explained to the patient, who gave informed consent.  Procedure note: The patient was placed in the supine position.  After general anesthesia was administered, the right groin region was prepped and draped using the usual sterile technique with Betadine.  Surgical site confirmation was performed.  An incision was made in the right groin region down to the external oblique aponeurosis.  The aponeurosis was incised to the external ring.  A Penrose drain was placed around the spermatic cord.  The vase deferens was noted within the spermatic cord.  The ilioinguinal nerve was identified and retracted superiorly from the operative field.  The patient had a large indirect hernia sac.  This was freed away from the spermatic cord up to the peritoneal reflection and inverted.  A large lipoma of the cord was also found and a high ligation was performed using a 3-0 Vicryl tie.  A large Bard PerFix plug was then inserted and the indirect hernia.  An onlay patch was then placed along the floor of the inguinal canal and secured superiorly to the conjoined tendon and inferiorly to the shelving edge of Poupart's ligament using 2-0 Novafil interrupted sutures.  The internal ring was re-created using a 2-0 Novafil interrupted suture.  The external oblique aponeurosis was reapproximated using a 2-0 Vicryl running suture.  Subcutaneous layer was reapproximated using a 3-0 Vicryl interrupted suture.  Exparel was instilled into the surrounding wound.  The skin  was closed using a 4-0 Monocryl subcuticular suture.  Dermabond was applied.  All tape and needle counts were correct at the end of the procedure.  The patient was awakened and transferred to PACU in stable condition.  Complications: None  EBL: Minimal  Specimen: None

## 2019-03-05 ENCOUNTER — Encounter (HOSPITAL_COMMUNITY): Payer: Self-pay | Admitting: General Surgery

## 2019-03-06 DIAGNOSIS — I10 Essential (primary) hypertension: Secondary | ICD-10-CM | POA: Diagnosis not present

## 2019-03-06 DIAGNOSIS — K4090 Unilateral inguinal hernia, without obstruction or gangrene, not specified as recurrent: Secondary | ICD-10-CM | POA: Diagnosis not present

## 2019-03-06 DIAGNOSIS — Z23 Encounter for immunization: Secondary | ICD-10-CM | POA: Diagnosis not present

## 2019-03-08 ENCOUNTER — Telehealth: Payer: Self-pay | Admitting: General Surgery

## 2019-03-08 ENCOUNTER — Telehealth (INDEPENDENT_AMBULATORY_CARE_PROVIDER_SITE_OTHER): Payer: Self-pay | Admitting: General Surgery

## 2019-03-08 ENCOUNTER — Other Ambulatory Visit: Payer: Self-pay

## 2019-03-08 DIAGNOSIS — Z09 Encounter for follow-up examination after completed treatment for conditions other than malignant neoplasm: Secondary | ICD-10-CM

## 2019-03-08 MED ORDER — HYDROCODONE-ACETAMINOPHEN 5-325 MG PO TABS: 1.0000 | ORAL_TABLET | ORAL | 0 refills | Status: DC | PRN

## 2019-03-08 NOTE — Telephone Encounter (Signed)
Virtual visit performed.  Patient states he is having moderate incisional pain, which is not unexpected.  He states his pain is less than it was preoperatively.  He has run out of his hydrocodone.  I told him I would reorder his prescription.  He was instructed to call me should any issues arise.  He may increase his activity as able.

## 2019-03-22 DIAGNOSIS — I10 Essential (primary) hypertension: Secondary | ICD-10-CM | POA: Diagnosis not present

## 2019-03-27 DIAGNOSIS — N1831 Chronic kidney disease, stage 3a: Secondary | ICD-10-CM | POA: Diagnosis not present

## 2019-03-27 DIAGNOSIS — Z9483 Pancreas transplant status: Secondary | ICD-10-CM | POA: Diagnosis not present

## 2019-03-27 DIAGNOSIS — Z940 Kidney transplant status: Secondary | ICD-10-CM | POA: Diagnosis not present

## 2019-03-27 DIAGNOSIS — Z7952 Long term (current) use of systemic steroids: Secondary | ICD-10-CM | POA: Diagnosis not present

## 2019-03-27 DIAGNOSIS — Z79899 Other long term (current) drug therapy: Secondary | ICD-10-CM | POA: Diagnosis not present

## 2019-03-27 DIAGNOSIS — Z4822 Encounter for aftercare following kidney transplant: Secondary | ICD-10-CM | POA: Diagnosis not present

## 2019-03-27 DIAGNOSIS — Z5181 Encounter for therapeutic drug level monitoring: Secondary | ICD-10-CM | POA: Diagnosis not present

## 2019-03-27 DIAGNOSIS — Z792 Long term (current) use of antibiotics: Secondary | ICD-10-CM | POA: Diagnosis not present

## 2019-03-30 DIAGNOSIS — M1612 Unilateral primary osteoarthritis, left hip: Secondary | ICD-10-CM | POA: Diagnosis not present

## 2019-04-22 DIAGNOSIS — I10 Essential (primary) hypertension: Secondary | ICD-10-CM | POA: Diagnosis not present

## 2019-04-24 DIAGNOSIS — I10 Essential (primary) hypertension: Secondary | ICD-10-CM | POA: Diagnosis not present

## 2019-05-22 DIAGNOSIS — I10 Essential (primary) hypertension: Secondary | ICD-10-CM | POA: Diagnosis not present

## 2019-06-22 DIAGNOSIS — I10 Essential (primary) hypertension: Secondary | ICD-10-CM | POA: Diagnosis not present

## 2019-07-23 DIAGNOSIS — I10 Essential (primary) hypertension: Secondary | ICD-10-CM | POA: Diagnosis not present

## 2019-07-31 DIAGNOSIS — I10 Essential (primary) hypertension: Secondary | ICD-10-CM | POA: Diagnosis not present

## 2019-08-20 DIAGNOSIS — I10 Essential (primary) hypertension: Secondary | ICD-10-CM | POA: Diagnosis not present

## 2019-08-21 DIAGNOSIS — Z940 Kidney transplant status: Secondary | ICD-10-CM | POA: Diagnosis not present

## 2019-08-21 DIAGNOSIS — Z79899 Other long term (current) drug therapy: Secondary | ICD-10-CM | POA: Diagnosis not present

## 2019-08-21 DIAGNOSIS — Z4822 Encounter for aftercare following kidney transplant: Secondary | ICD-10-CM | POA: Diagnosis not present

## 2019-08-21 DIAGNOSIS — N189 Chronic kidney disease, unspecified: Secondary | ICD-10-CM | POA: Diagnosis not present

## 2019-08-21 DIAGNOSIS — D631 Anemia in chronic kidney disease: Secondary | ICD-10-CM | POA: Diagnosis not present

## 2019-08-21 DIAGNOSIS — Z7952 Long term (current) use of systemic steroids: Secondary | ICD-10-CM | POA: Diagnosis not present

## 2019-08-21 DIAGNOSIS — D509 Iron deficiency anemia, unspecified: Secondary | ICD-10-CM | POA: Diagnosis not present

## 2019-08-28 DIAGNOSIS — E785 Hyperlipidemia, unspecified: Secondary | ICD-10-CM | POA: Diagnosis not present

## 2019-08-28 DIAGNOSIS — K219 Gastro-esophageal reflux disease without esophagitis: Secondary | ICD-10-CM | POA: Diagnosis not present

## 2019-09-20 DIAGNOSIS — I10 Essential (primary) hypertension: Secondary | ICD-10-CM | POA: Diagnosis not present

## 2019-10-20 DIAGNOSIS — I10 Essential (primary) hypertension: Secondary | ICD-10-CM | POA: Diagnosis not present

## 2019-10-29 DIAGNOSIS — I10 Essential (primary) hypertension: Secondary | ICD-10-CM | POA: Diagnosis not present

## 2019-10-29 DIAGNOSIS — E785 Hyperlipidemia, unspecified: Secondary | ICD-10-CM | POA: Diagnosis not present

## 2019-10-29 DIAGNOSIS — M545 Low back pain: Secondary | ICD-10-CM | POA: Diagnosis not present

## 2019-11-01 ENCOUNTER — Other Ambulatory Visit: Payer: Self-pay

## 2019-11-01 ENCOUNTER — Encounter (HOSPITAL_COMMUNITY): Payer: Self-pay | Admitting: *Deleted

## 2019-11-01 ENCOUNTER — Emergency Department (HOSPITAL_COMMUNITY)
Admission: EM | Admit: 2019-11-01 | Discharge: 2019-11-01 | Disposition: A | Discharge status: Home / Self Care | Payer: Medicare Other | Attending: Emergency Medicine | Admitting: Emergency Medicine

## 2019-11-01 DIAGNOSIS — T148XXA Other injury of unspecified body region, initial encounter: Secondary | ICD-10-CM | POA: Insufficient documentation

## 2019-11-01 DIAGNOSIS — Z87891 Personal history of nicotine dependence: Secondary | ICD-10-CM | POA: Diagnosis not present

## 2019-11-01 DIAGNOSIS — S0083XA Contusion of other part of head, initial encounter: Secondary | ICD-10-CM | POA: Diagnosis not present

## 2019-11-01 DIAGNOSIS — Z79899 Other long term (current) drug therapy: Secondary | ICD-10-CM | POA: Diagnosis not present

## 2019-11-01 DIAGNOSIS — S0181XA Laceration without foreign body of other part of head, initial encounter: Secondary | ICD-10-CM | POA: Insufficient documentation

## 2019-11-01 DIAGNOSIS — Y9389 Activity, other specified: Secondary | ICD-10-CM | POA: Diagnosis not present

## 2019-11-01 DIAGNOSIS — S00532A Contusion of oral cavity, initial encounter: Secondary | ICD-10-CM | POA: Diagnosis not present

## 2019-11-01 DIAGNOSIS — I10 Essential (primary) hypertension: Secondary | ICD-10-CM | POA: Diagnosis not present

## 2019-11-01 DIAGNOSIS — E119 Type 2 diabetes mellitus without complications: Secondary | ICD-10-CM | POA: Insufficient documentation

## 2019-11-01 DIAGNOSIS — Z940 Kidney transplant status: Secondary | ICD-10-CM | POA: Insufficient documentation

## 2019-11-01 DIAGNOSIS — Y999 Unspecified external cause status: Secondary | ICD-10-CM | POA: Insufficient documentation

## 2019-11-01 DIAGNOSIS — S01512A Laceration without foreign body of oral cavity, initial encounter: Secondary | ICD-10-CM

## 2019-11-01 DIAGNOSIS — T07XXXA Unspecified multiple injuries, initial encounter: Secondary | ICD-10-CM

## 2019-11-01 DIAGNOSIS — Y929 Unspecified place or not applicable: Secondary | ICD-10-CM | POA: Diagnosis not present

## 2019-11-01 DIAGNOSIS — S0993XA Unspecified injury of face, initial encounter: Secondary | ICD-10-CM | POA: Diagnosis present

## 2019-11-01 MED ORDER — AMOXICILLIN 500 MG PO CAPS: 500.0000 mg | ORAL_CAPSULE | Freq: Three times a day (TID) | ORAL | 0 refills | Status: DC

## 2019-11-01 MED ORDER — TETANUS-DIPHTH-ACELL PERTUSSIS 5-2.5-18.5 LF-MCG/0.5 IM SUSP
0.5000 mL | Freq: Once | INTRAMUSCULAR | Status: AC
  Administered 2019-11-01: 0.5 mL via INTRAMUSCULAR
  Filled 2019-11-01: qty 0.5

## 2019-11-01 MED ORDER — ACETAMINOPHEN 325 MG PO TABS
650.0000 mg | ORAL_TABLET | Freq: Once | ORAL | Status: AC
  Administered 2019-11-01: 650 mg via ORAL
  Filled 2019-11-01: qty 2
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
## 2019-11-01 NOTE — Discharge Instructions (Addendum)
Return if any problems.
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
## 2019-11-20 DIAGNOSIS — I10 Essential (primary) hypertension: Secondary | ICD-10-CM | POA: Diagnosis not present

## 2019-12-20 DIAGNOSIS — I10 Essential (primary) hypertension: Secondary | ICD-10-CM | POA: Diagnosis not present

## 2020-01-04 ENCOUNTER — Other Ambulatory Visit: Payer: Self-pay

## 2020-01-04 ENCOUNTER — Emergency Department (HOSPITAL_COMMUNITY)
Admission: EM | Admit: 2020-01-04 | Discharge: 2020-01-04 | Disposition: A | Discharge status: Home / Self Care | Payer: Medicare Other | Attending: Emergency Medicine | Admitting: Emergency Medicine

## 2020-01-04 ENCOUNTER — Encounter (HOSPITAL_COMMUNITY): Payer: Self-pay

## 2020-01-04 DIAGNOSIS — Z5321 Procedure and treatment not carried out due to patient leaving prior to being seen by health care provider: Secondary | ICD-10-CM | POA: Insufficient documentation

## 2020-01-04 DIAGNOSIS — R109 Unspecified abdominal pain: Secondary | ICD-10-CM | POA: Diagnosis not present

## 2020-01-04 DIAGNOSIS — K5900 Constipation, unspecified: Secondary | ICD-10-CM | POA: Diagnosis not present

## 2020-01-04 DIAGNOSIS — R1110 Vomiting, unspecified: Secondary | ICD-10-CM | POA: Diagnosis present

## 2020-01-04 LAB — COMPREHENSIVE METABOLIC PANEL
ALT: 18 U/L (ref 0–44)
AST: 22 U/L (ref 15–41)
Albumin: 4 g/dL (ref 3.5–5.0)
Alkaline Phosphatase: 59 U/L (ref 38–126)
Anion gap: 9 (ref 5–15)
BUN: 16 mg/dL (ref 8–23)
CO2: 25 mmol/L (ref 22–32)
Calcium: 9.5 mg/dL (ref 8.9–10.3)
Chloride: 104 mmol/L (ref 98–111)
Creatinine, Ser: 1.4 mg/dL — ABNORMAL HIGH (ref 0.61–1.24)
GFR calc Af Amer: >60 mL/min (ref >60)
GFR calc non Af Amer: 53 mL/min — ABNORMAL LOW (ref >60)
Glucose, Bld: 96 mg/dL (ref 70–99)
Potassium: 3.8 mmol/L (ref 3.5–5.1)
Sodium: 138 mmol/L (ref 135–145)
Total Bilirubin: 0.5 mg/dL (ref 0.3–1.2)
Total Protein: 7.9 g/dL (ref 6.5–8.1)

## 2020-01-04 LAB — CBC
HCT: 45.1 % (ref 39.0–52.0)
Hemoglobin: 13.9 g/dL (ref 13.0–17.0)
MCH: 22.2 pg — ABNORMAL LOW (ref 26.0–34.0)
MCHC: 30.8 g/dL (ref 30.0–36.0)
MCV: 71.9 fL — ABNORMAL LOW (ref 80.0–100.0)
Platelets: 175 10*3/uL (ref 150–400)
RBC: 6.27 MIL/uL — ABNORMAL HIGH (ref 4.22–5.81)
RDW: 16.3 % — ABNORMAL HIGH (ref 11.5–15.5)
WBC: 9.5 10*3/uL (ref 4.0–10.5)
nRBC: 0 % (ref 0.0–0.2)

## 2020-01-04 LAB — LIPASE, BLOOD: Lipase: 31 U/L (ref 11–51)

## 2020-01-04 NOTE — ED Triage Notes (Signed)
Pt to er, pt states that he is here for abd pain, states that his pain started yesterday.  Denies any precipitating event.  States that nothing seems to make is pain better or worse.  States that he has vomited 4 times, states that he is a little constipated, last bm a couple days ago.

## 2020-01-20 DIAGNOSIS — I10 Essential (primary) hypertension: Secondary | ICD-10-CM | POA: Diagnosis not present

## 2020-02-12 DIAGNOSIS — E785 Hyperlipidemia, unspecified: Secondary | ICD-10-CM | POA: Diagnosis not present

## 2020-02-12 DIAGNOSIS — K219 Gastro-esophageal reflux disease without esophagitis: Secondary | ICD-10-CM | POA: Diagnosis not present

## 2020-02-19 DIAGNOSIS — Z7952 Long term (current) use of systemic steroids: Secondary | ICD-10-CM | POA: Diagnosis not present

## 2020-02-19 DIAGNOSIS — Z940 Kidney transplant status: Secondary | ICD-10-CM | POA: Diagnosis not present

## 2020-02-19 DIAGNOSIS — Z5181 Encounter for therapeutic drug level monitoring: Secondary | ICD-10-CM | POA: Diagnosis not present

## 2020-02-19 DIAGNOSIS — Z79899 Other long term (current) drug therapy: Secondary | ICD-10-CM | POA: Diagnosis not present

## 2020-02-19 DIAGNOSIS — Z7951 Long term (current) use of inhaled steroids: Secondary | ICD-10-CM | POA: Diagnosis not present

## 2020-02-19 DIAGNOSIS — Z4822 Encounter for aftercare following kidney transplant: Secondary | ICD-10-CM | POA: Diagnosis not present

## 2020-02-20 DIAGNOSIS — I10 Essential (primary) hypertension: Secondary | ICD-10-CM | POA: Diagnosis not present

## 2020-03-03 DIAGNOSIS — Z7189 Other specified counseling: Secondary | ICD-10-CM | POA: Diagnosis not present

## 2020-03-03 DIAGNOSIS — I10 Essential (primary) hypertension: Secondary | ICD-10-CM | POA: Diagnosis not present

## 2020-03-03 DIAGNOSIS — M159 Polyosteoarthritis, unspecified: Secondary | ICD-10-CM | POA: Diagnosis not present

## 2020-03-03 DIAGNOSIS — E785 Hyperlipidemia, unspecified: Secondary | ICD-10-CM | POA: Diagnosis not present

## 2020-03-03 DIAGNOSIS — Z23 Encounter for immunization: Secondary | ICD-10-CM | POA: Diagnosis not present

## 2020-03-13 DIAGNOSIS — E785 Hyperlipidemia, unspecified: Secondary | ICD-10-CM | POA: Diagnosis not present

## 2020-03-13 DIAGNOSIS — K219 Gastro-esophageal reflux disease without esophagitis: Secondary | ICD-10-CM | POA: Diagnosis not present

## 2020-03-21 DIAGNOSIS — I10 Essential (primary) hypertension: Secondary | ICD-10-CM | POA: Diagnosis not present

## 2020-04-21 DIAGNOSIS — I10 Essential (primary) hypertension: Secondary | ICD-10-CM | POA: Diagnosis not present

## 2020-05-13 DIAGNOSIS — E785 Hyperlipidemia, unspecified: Secondary | ICD-10-CM | POA: Diagnosis not present

## 2020-05-13 DIAGNOSIS — K219 Gastro-esophageal reflux disease without esophagitis: Secondary | ICD-10-CM | POA: Diagnosis not present

## 2020-05-21 DIAGNOSIS — I10 Essential (primary) hypertension: Secondary | ICD-10-CM | POA: Diagnosis not present

## 2020-05-25 ENCOUNTER — Encounter (HOSPITAL_COMMUNITY): Payer: Self-pay | Admitting: Emergency Medicine

## 2020-05-25 ENCOUNTER — Emergency Department (HOSPITAL_COMMUNITY)
Admission: EM | Admit: 2020-05-25 | Discharge: 2020-05-25 | Disposition: A | Discharge status: Home / Self Care | Payer: Medicare Other | Attending: Emergency Medicine | Admitting: Emergency Medicine

## 2020-05-25 ENCOUNTER — Other Ambulatory Visit: Payer: Self-pay

## 2020-05-25 ENCOUNTER — Emergency Department (HOSPITAL_COMMUNITY): Payer: Medicare Other

## 2020-05-25 DIAGNOSIS — Z743 Need for continuous supervision: Secondary | ICD-10-CM | POA: Diagnosis not present

## 2020-05-25 DIAGNOSIS — J069 Acute upper respiratory infection, unspecified: Secondary | ICD-10-CM

## 2020-05-25 DIAGNOSIS — Z87891 Personal history of nicotine dependence: Secondary | ICD-10-CM | POA: Insufficient documentation

## 2020-05-25 DIAGNOSIS — Z79899 Other long term (current) drug therapy: Secondary | ICD-10-CM | POA: Diagnosis not present

## 2020-05-25 DIAGNOSIS — I10 Essential (primary) hypertension: Secondary | ICD-10-CM | POA: Diagnosis not present

## 2020-05-25 DIAGNOSIS — Z20822 Contact with and (suspected) exposure to covid-19: Secondary | ICD-10-CM | POA: Insufficient documentation

## 2020-05-25 DIAGNOSIS — E119 Type 2 diabetes mellitus without complications: Secondary | ICD-10-CM | POA: Diagnosis not present

## 2020-05-25 DIAGNOSIS — R059 Cough, unspecified: Secondary | ICD-10-CM | POA: Diagnosis not present

## 2020-05-25 LAB — RESP PANEL BY RT-PCR (FLU A&B, COVID) ARPGX2
Influenza A by PCR: NEGATIVE
Influenza B by PCR: NEGATIVE
SARS Coronavirus 2 by RT PCR: NEGATIVE

## 2020-05-25 NOTE — ED Provider Notes (Signed)
[facility] EMERGENCY DEPARTMENT Provider Note   CSN: [id number] Arrival date & time: 05/25/20  0751     History Chief Complaint  Patient presents with  . Cough    Robert Durham is a 64 y.o. male.  HPI He complains of illness for 2 days with sniffling, sneezing, general achiness.  He denies fever, chills, productive cough, weakness or dizziness.  He has been vaccinated against Covid and influenza.  No recent sick contacts are known to him.  There are no other known modifying factors.    Past Medical History:  Diagnosis Date  . Chronic back pain   . Chronic hip pain   . Chronic nausea   . Depression   . Diabetes mellitus   . Diverticulitis   . Gastritis   . H/O kidney transplant   . Hypertension   . Kidney stone   . Renal disorder   . Urinary retention     Patient Active Problem List   Diagnosis Date Noted  . Right inguinal hernia   . Chest pain 03/20/2016    Past Surgical History:  Procedure Laterality Date  . COLONOSCOPY  05/27/2003   [facility] terminal ileum,rectum, and colon  . HERNIA REPAIR    . HIP SURGERY Left   . INGUINAL HERNIA REPAIR Right 03/02/2019   Procedure: HERNIA REPAIR INGUINAL ADULT WITH MESH;  Surgeon: Aviva Signs, MD;  Location: [facility];  Service: General;  Laterality: Right;  . KIDNEY TRANSPLANT    . NEPHRECTOMY TRANSPLANTED ORGAN Left        Family History  Problem Relation Age of Onset  . Hypertension Sister     Social History   Tobacco Use  . Smoking status: Former Smoker    Packs/day: 0.00    Years: 5.00    Pack years: 0.00    Quit date: 06/14/1972    Years since quitting: 47.9  . Smokeless tobacco: Never Used  Vaping Use  . Vaping Use: Never used  Substance Use Topics  . Alcohol use: No  . Drug use: No    Home Medications Prior to Admission medications   Medication Sig Start Date End Date Taking? Authorizing Provider  amoxicillin (AMOXIL) 500 MG capsule Take 1 capsule (500 mg total) by mouth 3 (three) times  daily. 11/01/19   Fransico Meadow, PA-C  atorvastatin (LIPITOR) 10 MG tablet Take 10 mg by mouth daily with breakfast.     [provider]  cycloSPORINE (SANDIMMUNE) 100 MG capsule Take 100 mg by mouth 2 (two) times daily. Take with 25 mg tablet to equal 125 mg twice daily    [provider]  cycloSPORINE (SANDIMMUNE) 25 MG capsule Take 25 mg by mouth 2 (two) times daily. Take with 100 mg tablet to equal dose of 125 mg twice daily    [provider]  esomeprazole (NEXIUM) 40 MG capsule Take 40 mg by mouth daily.     [provider]  HYDROcodone-acetaminophen (NORCO) 5-325 MG tablet Take 1 tablet by mouth every 4 (four) hours as needed for moderate pain. 03/08/19 04/17/19  Aviva Signs, MD  predniSONE (DELTASONE) 5 MG tablet Take 5 mg by mouth daily with breakfast.     [provider]  promethazine (PHENERGAN) 25 MG tablet Take 25 mg by mouth every 6 (six) hours as needed for nausea or vomiting.    [provider]  tamsulosin (FLOMAX) 0.4 MG CAPS capsule Take 0.4 mg by mouth daily.     [provider]  vitamin B-12 (CYANOCOBALAMIN) 1000 MCG tablet Take 1,000 mcg by mouth daily.    [provider]  vitamin C (ASCORBIC ACID) 500 MG tablet Take 500 mg by mouth daily. 12/23/14   [provider]    Allergies    Codeine, Enalapril maleate, Nsaids, Tape, Tolmetin, and Vasotec  Review of Systems   Review of Systems  All other systems reviewed and are negative.   Physical Exam Updated Vital Signs BP (!) 148/97 (BP Location: Right Arm)   Pulse 76   Temp 98.4 F (36.9 C) (Oral)   Resp 20   Ht 5\' 11"  (1.803 m)   Wt 83.9 kg   SpO2 100%   BMI 25.80 kg/m   Physical Exam Vitals and nursing note reviewed.  Constitutional:      General: He is not in acute distress.    Appearance: He is well-developed and well-nourished. He is not ill-appearing, toxic-appearing or diaphoretic.  HENT:     Head: Normocephalic and  atraumatic.     Right Ear: External ear normal.     Left Ear: External ear normal.  Eyes:     Extraocular Movements: EOM normal.     Conjunctiva/sclera: Conjunctivae normal.     Pupils: Pupils are equal, round, and reactive to light.  Neck:     Trachea: Phonation normal.  Cardiovascular:     Rate and Rhythm: Normal rate and regular rhythm.     Heart sounds: Normal heart sounds.  Pulmonary:     Effort: Pulmonary effort is normal. No respiratory distress.     Breath sounds: Normal breath sounds. No stridor.  Chest:     Chest wall: No bony tenderness.  Abdominal:     General: There is no distension.     Palpations: Abdomen is soft.     Tenderness: There is no abdominal tenderness.  Musculoskeletal:        General: Normal range of motion.     Cervical back: Normal range of motion and neck supple.  Skin:    General: Skin is warm, dry and intact.  Neurological:     Mental Status: He is alert and oriented to person, place, and time.     Cranial Nerves: No cranial nerve deficit.     Sensory: No sensory deficit.     Motor: No abnormal muscle tone.     Coordination: Coordination normal.  Psychiatric:        Mood and Affect: Mood and affect and mood normal.        Behavior: Behavior normal.        Thought Content: Thought content normal.        Judgment: Judgment normal.     ED Results / Procedures / Treatments   Labs (all labs ordered are listed, but only abnormal results are displayed) Labs Reviewed  RESP PANEL BY RT-PCR (FLU A&B, COVID) [id number]    EKG None  Radiology DG Chest Port 1 View  Result Date: 05/25/2020 CLINICAL DATA:  Productive cough. EXAM: PORTABLE CHEST 1 VIEW COMPARISON:  July 27, 2018. FINDINGS: The heart size and mediastinal contours are within normal limits. Both lungs are clear. The visualized skeletal structures are unremarkable. IMPRESSION: No active disease. Aortic Atherosclerosis (ICD10-I70.0). Electronically Signed   By: Marijo Conception M.D.    On: 05/25/2020 09:16    Procedures Procedures (including critical care time)  Medications Ordered in ED Medications - No data to display  ED Course  I have reviewed the triage vital signs and the  nursing notes.  Pertinent labs & imaging results that were available during my care of the patient were reviewed by me and considered in my medical decision making (see chart for details).    MDM Rules/Calculators/A&P                           Patient Vitals for the past 24 hrs:  BP Temp Temp src Pulse Resp SpO2 Height Weight  05/25/20 0825 (!) 148/97 98.4 F (36.9 C) Oral 76 20 100 % 5\' 11"  (1.803 m) 83.9 kg    10:26 AM Reevaluation with update and discussion. After initial assessment and treatment, an updated evaluation reveals no change in status, findings discussed with the patient and all questions were answered. Daleen Bo   Medical Decision Making:  This patient is presenting for evaluation of URI, which does not require a range of treatment options, and is not a complaint that involves a high risk of morbidity and mortality. The differential diagnoses include URI, Covid infection, influenza infection. I decided to review old records, and in summary elderly patient, presenting with symptoms of URI, and no other systemic illness or complaints.  I do not require additional historical information from anyone.  Clinical Laboratory Tests Ordered, included Covid and influenza testing. Review indicates normal. Radiologic Tests Ordered, included chest x-ray.  I independently Visualized: Radiograph images, which show normal  Cardiac Monitor Tracing which shows normal sinus rhythm    Critical Interventions-clinical evaluation, laboratory testing, observation reassessment  After These Interventions, the Patient was reevaluated and was found stable for discharge.  Patient with minor symptoms, consistent with URI.  Covid influenza testing is negative.  Doubt complications from chronic  immunosuppression or acute bacterial infection.  CRITICAL CARE-no Performed by: Daleen Bo  Nursing Notes Reviewed/ Care Coordinated Applicable Imaging Reviewed Interpretation of Laboratory Data incorporated into ED treatment  The patient appears reasonably screened and/or stabilized for discharge and I doubt any other medical condition or other [facility] requiring further screening, evaluation, or treatment in the ED at this time prior to discharge.  Plan: Home Medications-continue usual; Home Treatments-rest, fluids; return here if the recommended treatment, does not improve the symptoms; Recommended follow up-PCP, as needed     Final Clinical Impression(s) / ED Diagnoses Final diagnoses:  Viral URI with cough    Rx / DC Orders ED Discharge Orders    None       Daleen Bo, MD 05/25/20 1029

## 2020-05-25 NOTE — Discharge Instructions (Addendum)
The testing today does not show any serious problems.  You can use Robitussin-DM for cough or Mucinex for congestion.  Follow-up with your doctor for problems

## 2020-05-25 NOTE — ED Triage Notes (Signed)
Patient c/o cough with body aches, chills, and fatigue. Denies any fevers. Per patient symptoms started on Tuesday. Patient taking tylenol with no improvement. Per patient cough productive with thick white-yellow sputum.

## 2020-06-02 DIAGNOSIS — Z0000 Encounter for general adult medical examination without abnormal findings: Secondary | ICD-10-CM | POA: Diagnosis not present

## 2020-06-02 DIAGNOSIS — I10 Essential (primary) hypertension: Secondary | ICD-10-CM | POA: Diagnosis not present

## 2020-10-10 IMAGING — DX DG HIP (WITH OR WITHOUT PELVIS) 5+V BILAT
5 series · 5 of 5 positions shown · non-contrast
Comparison: 12/13/2018

CLINICAL DATA: Back pain and hip pain

EXAM:
DG HIP (WITH OR WITHOUT PELVIS) 5+V BILAT

[pelvis ap]
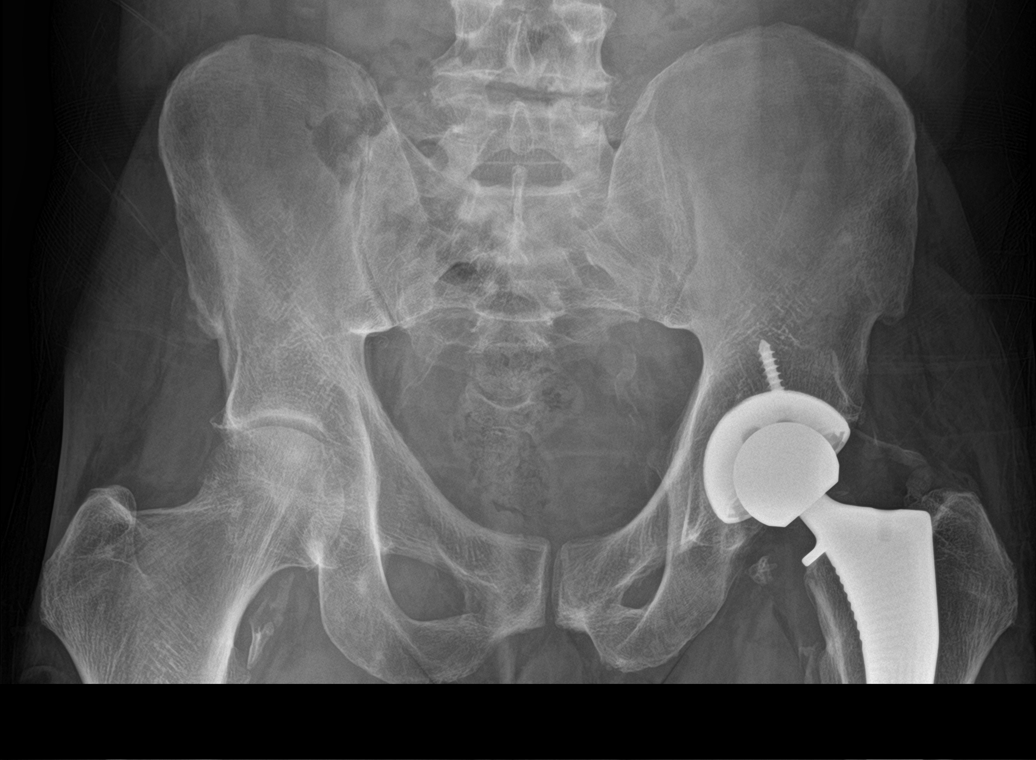

[hip ap (1 of 2)]
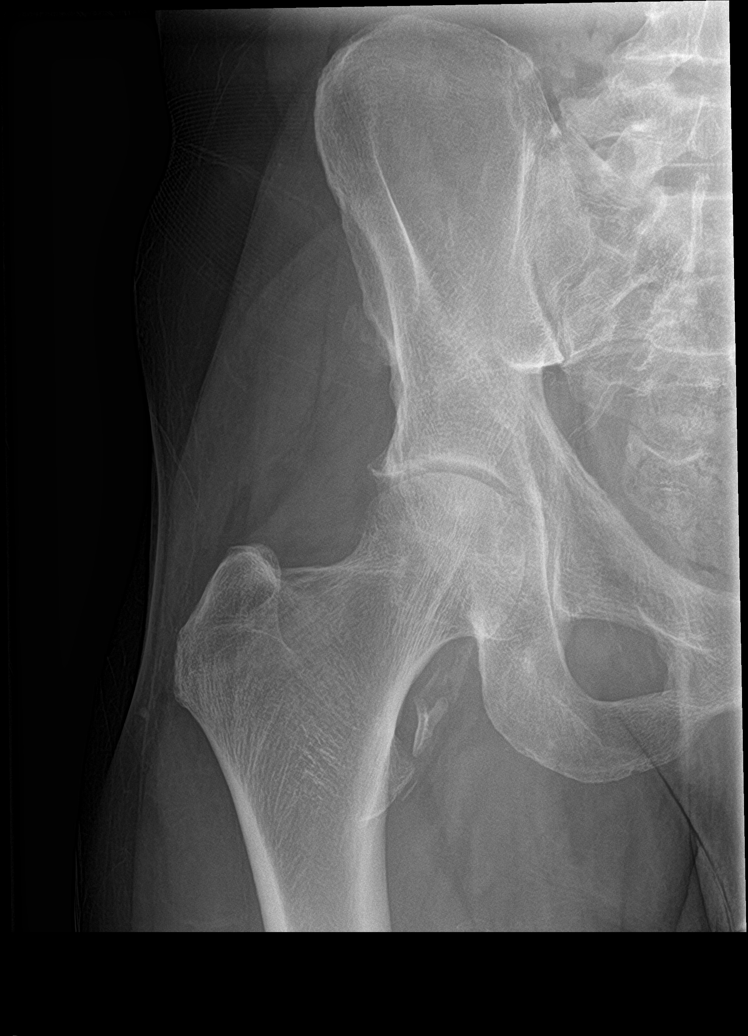

[hip lat (1 of 2)]
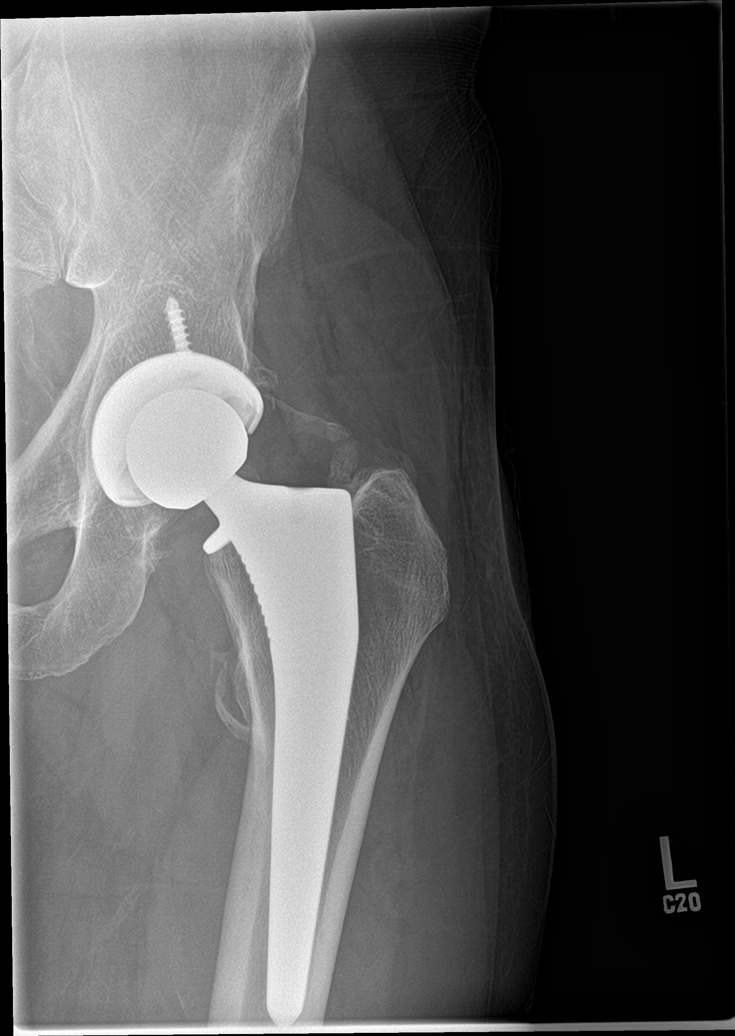

[hip ap (2 of 2)]
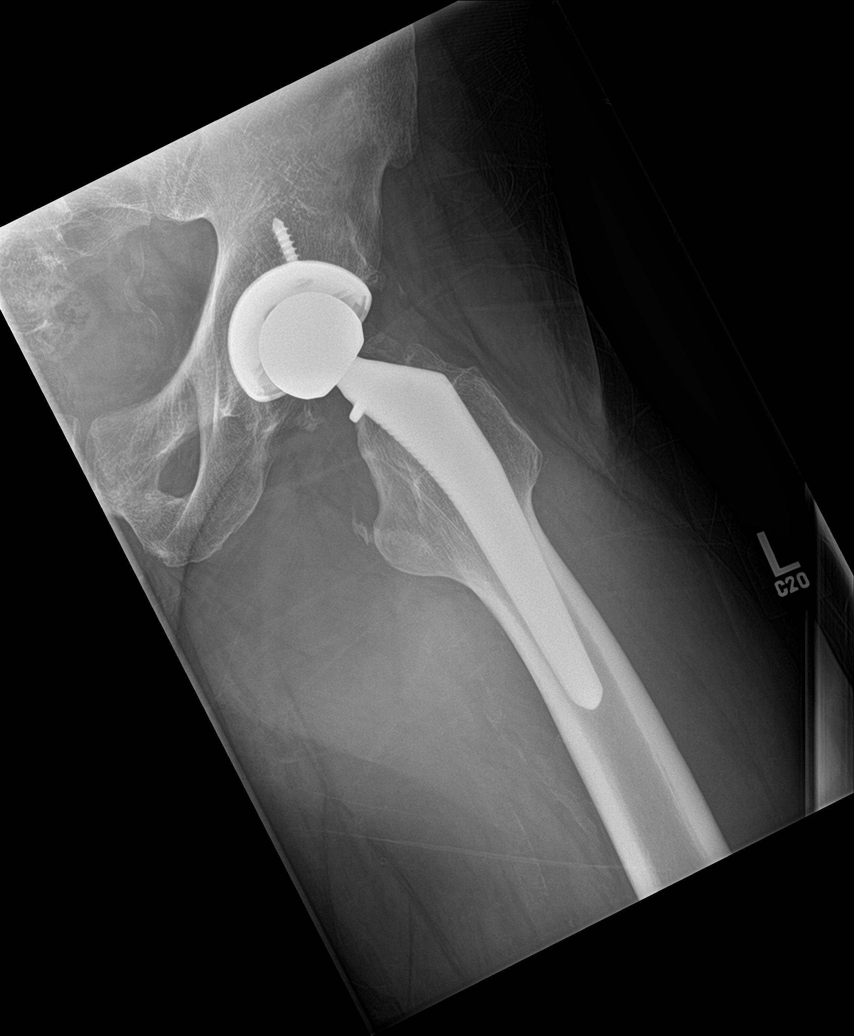

[hip lat (2 of 2)]
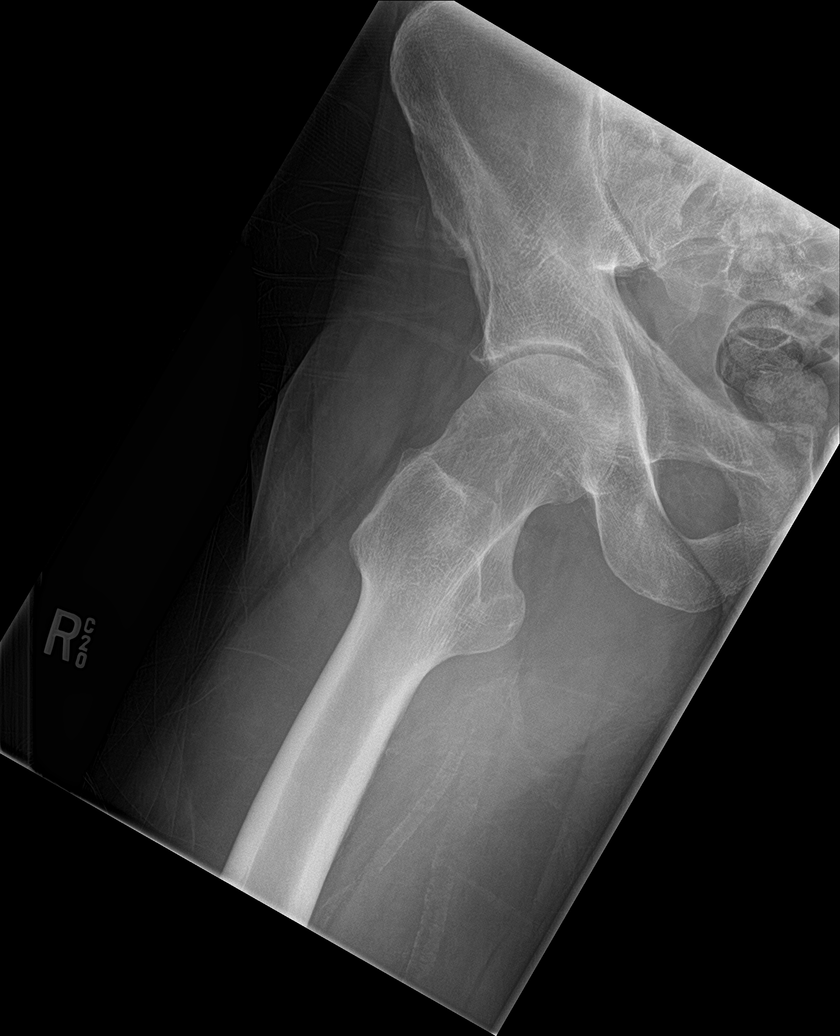

[5 of 5 positions shown; findings below may reference images not displayed]

FINDINGS: Pelvic ring is intact. Left hip replacement is noted and stable. No
acute fracture or dislocation is seen. Degenerative changes of the
right hip are noted.
IMPRESSION: Mild degenerative changes of the right hip. No acute abnormality is
noted.

## 2020-10-10 IMAGING — DX LUMBAR SPINE - COMPLETE 4+ VIEW
5 series · 5 of 5 positions shown · non-contrast
Comparison: Lumbar spine radiographs 11/13/2018

CLINICAL DATA: Pt c/o lower back pain with radiation into hips
bilaterally x 2-3 wks without known injury. Pt was seen in ED over
the weekend for same but no imaging was done

EXAM:
LUMBAR SPINE - COMPLETE 4+ VIEW

[l-spine obl (1 of 2)]
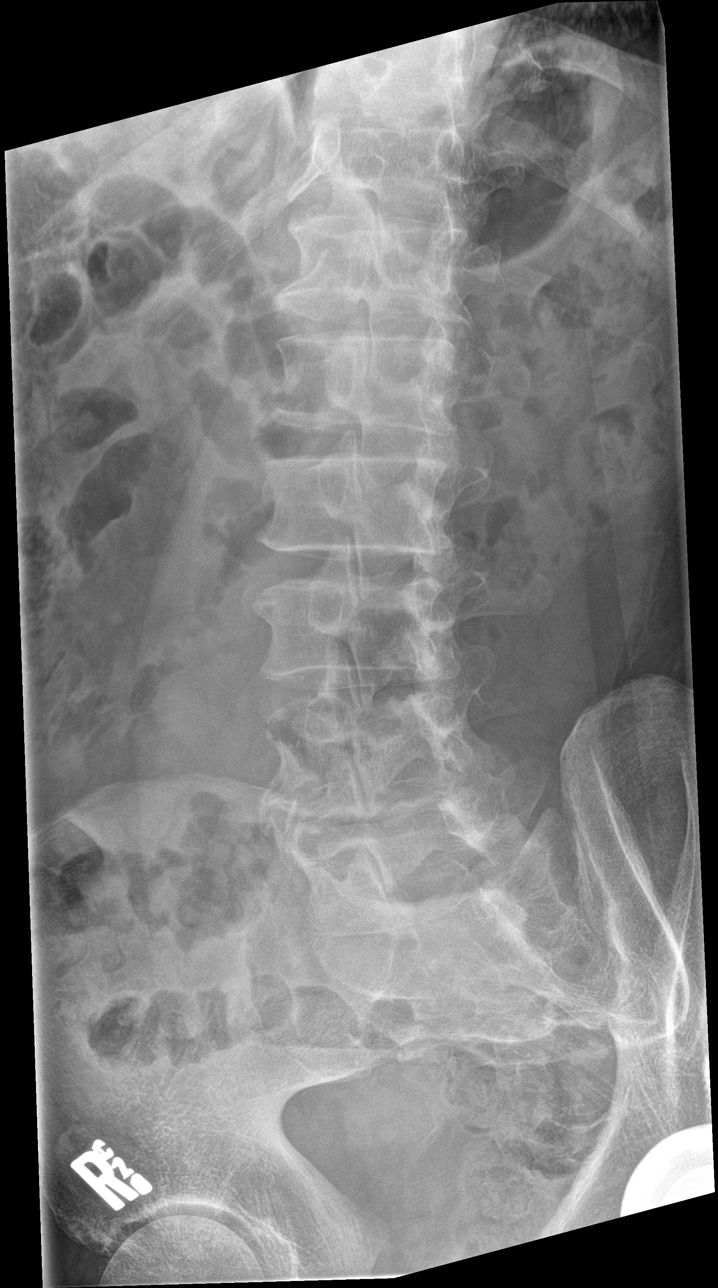

[l-spine obl (2 of 2)]
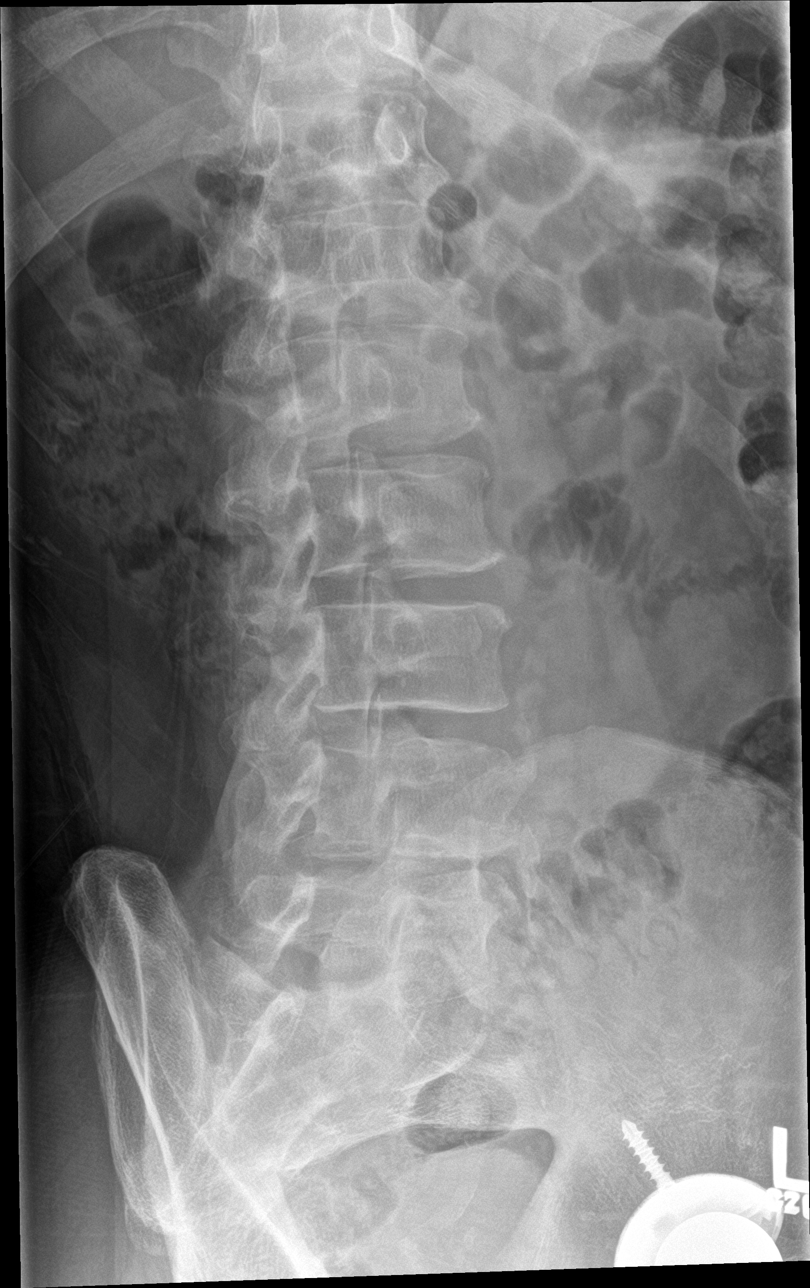

[l-spine lat]
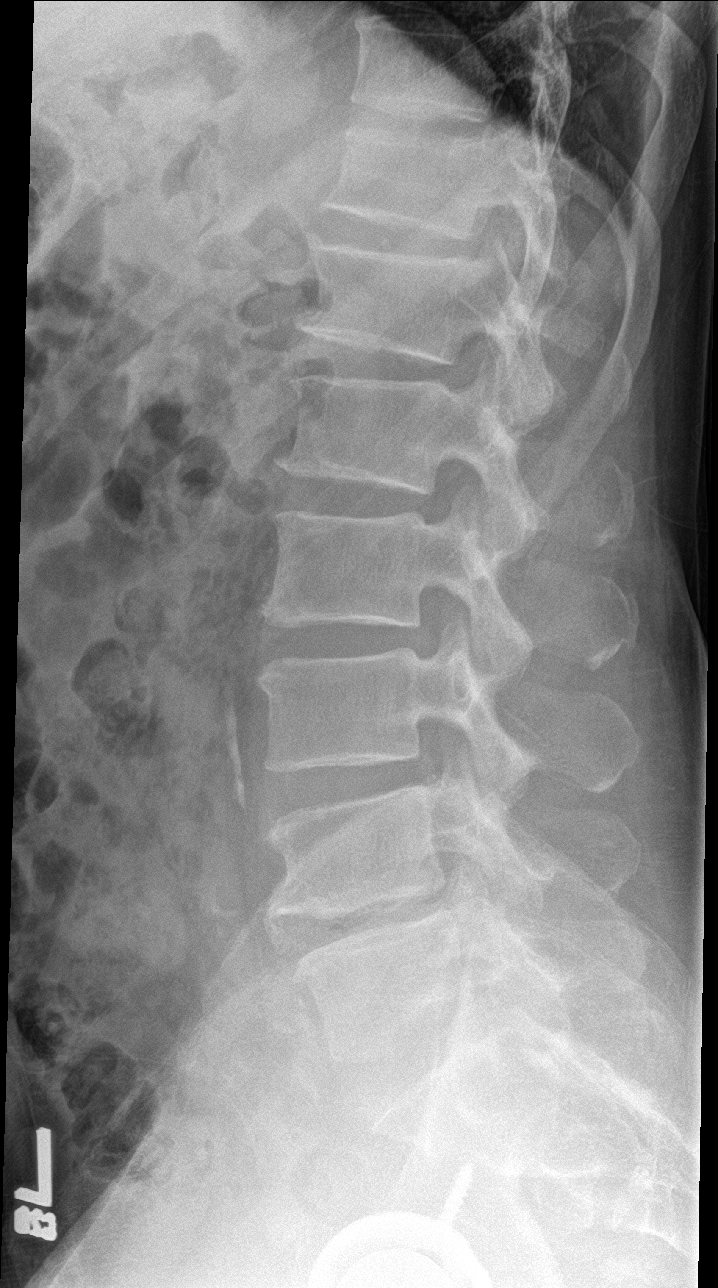

[l-spine spot]
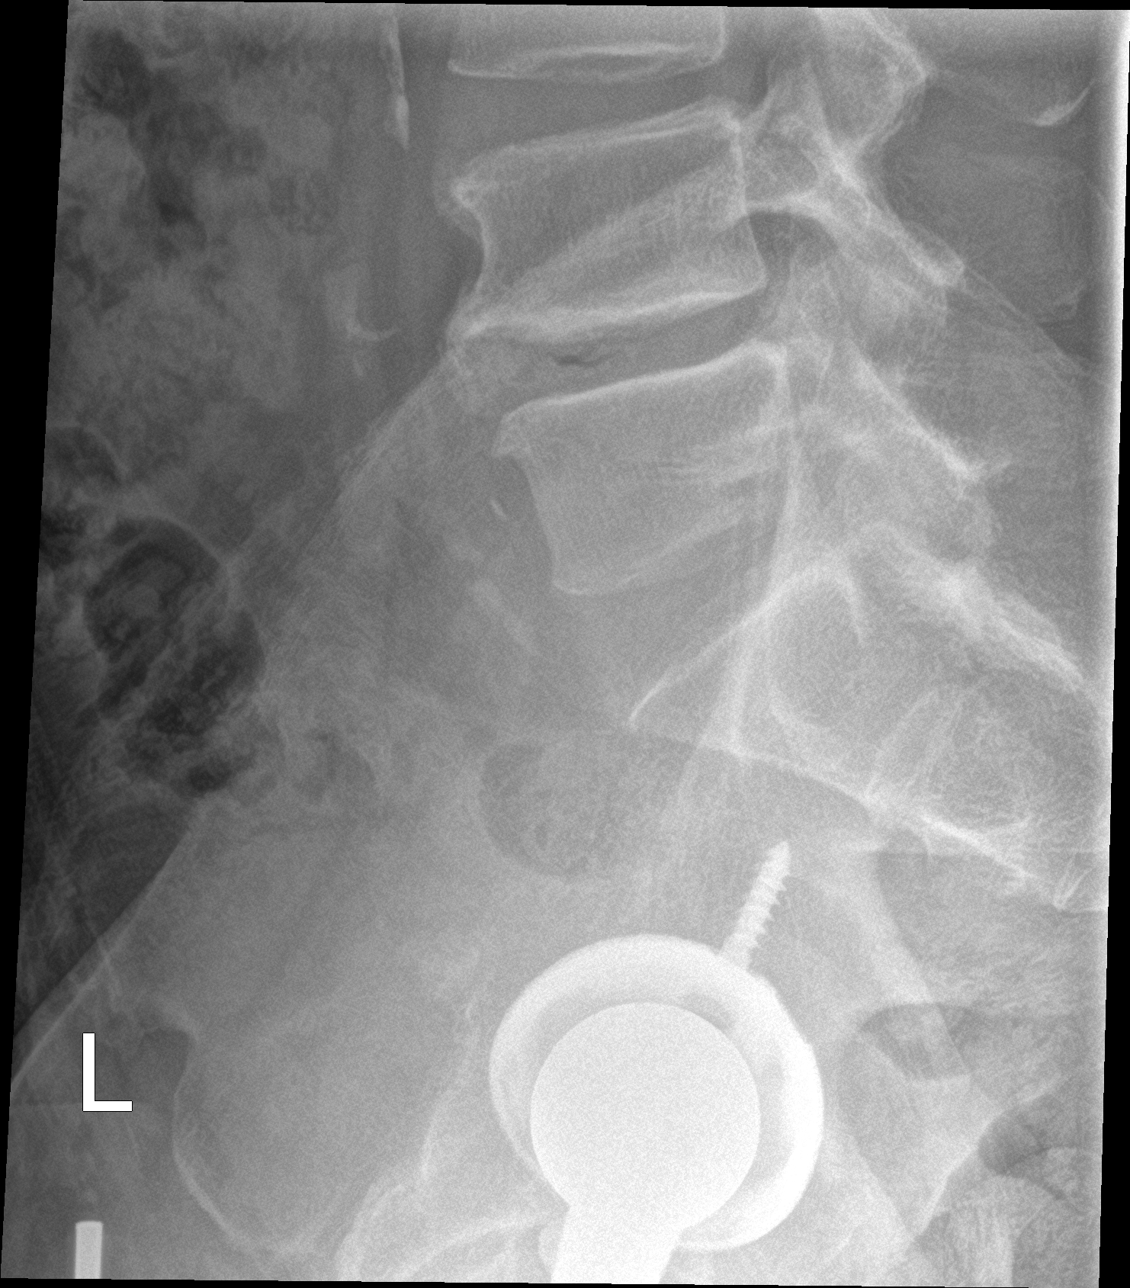

[l-spine ap]
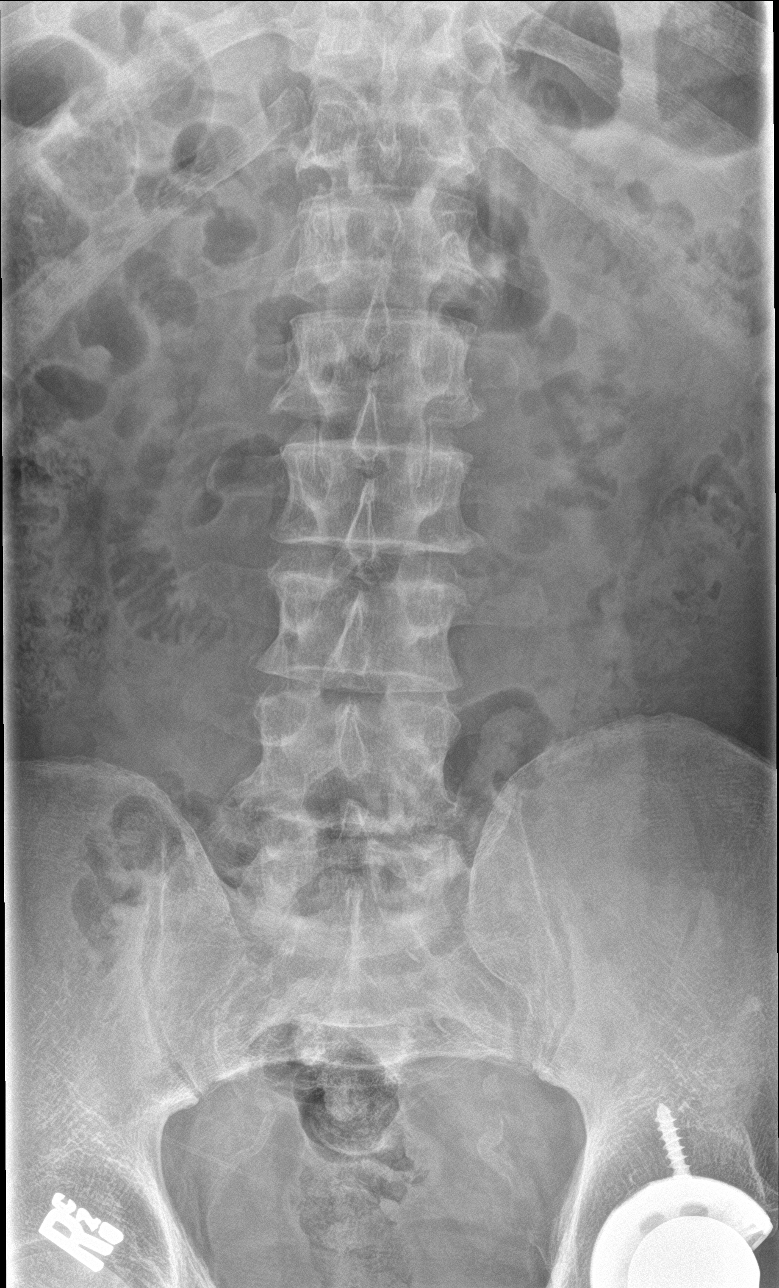

[5 of 5 positions shown; findings below may reference images not displayed]

FINDINGS: There are 5 non-rib-bearing lumbar type vertebral bodies. Alignment
is intact. Mild height loss at T11 and T12 appear slightly
progressed compared to 0957. Otherwise vertebral body heights are
maintained. There is moderate intervertebral disc space loss at L4-5
with associated endplate sclerosis and spurring. Mild anterior
osteophytosis at several other levels. Facet arthropathy most
pronounced at the L4 and L5 levels. Aortic atherosclerosis.
Nonobstructive bowel gas pattern.
IMPRESSION: 1.  Multilevel degenerative disc disease most pronounced at L4-5.

2. Mild wedge compression deformities at T11 and T12 appear slightly
progressed compared to CT from [DATE].

## 2021-02-09 ENCOUNTER — Encounter (HOSPITAL_COMMUNITY): Payer: Self-pay | Admitting: Emergency Medicine

## 2021-02-09 ENCOUNTER — Other Ambulatory Visit: Payer: Self-pay

## 2021-02-09 ENCOUNTER — Emergency Department (HOSPITAL_COMMUNITY)
Admission: EM | Admit: 2021-02-09 | Discharge: 2021-02-09 | Disposition: A | Discharge status: Home / Self Care | Payer: 59 | Attending: Emergency Medicine | Admitting: Emergency Medicine

## 2021-02-09 DIAGNOSIS — Z87891 Personal history of nicotine dependence: Secondary | ICD-10-CM | POA: Diagnosis not present

## 2021-02-09 DIAGNOSIS — I10 Essential (primary) hypertension: Secondary | ICD-10-CM | POA: Diagnosis not present

## 2021-02-09 DIAGNOSIS — M545 Low back pain, unspecified: Secondary | ICD-10-CM | POA: Insufficient documentation

## 2021-02-09 DIAGNOSIS — M542 Cervicalgia: Secondary | ICD-10-CM | POA: Diagnosis not present

## 2021-02-09 DIAGNOSIS — E119 Type 2 diabetes mellitus without complications: Secondary | ICD-10-CM | POA: Insufficient documentation

## 2021-02-09 LAB — CBC WITH DIFFERENTIAL/PLATELET
Abs Immature Granulocytes: 0.04 10*3/uL (ref 0.00–0.07)
Basophils Absolute: 0 10*3/uL (ref 0.0–0.1)
Basophils Relative: 1 %
Eosinophils Absolute: 0.3 10*3/uL (ref 0.0–0.5)
Eosinophils Relative: 4 %
HCT: 44.5 % (ref 39.0–52.0)
Hemoglobin: 13.7 g/dL (ref 13.0–17.0)
Immature Granulocytes: 1 %
Lymphocytes Relative: 32 %
Lymphs Abs: 2.2 10*3/uL (ref 0.7–4.0)
MCH: 22.3 pg — ABNORMAL LOW (ref 26.0–34.0)
MCHC: 30.8 g/dL (ref 30.0–36.0)
MCV: 72.4 fL — ABNORMAL LOW (ref 80.0–100.0)
Monocytes Absolute: 0.6 10*3/uL (ref 0.1–1.0)
Monocytes Relative: 9 %
Neutro Abs: 3.7 10*3/uL (ref 1.7–7.7)
Neutrophils Relative %: 53 %
Platelets: 163 10*3/uL (ref 150–400)
RBC: 6.15 MIL/uL — ABNORMAL HIGH (ref 4.22–5.81)
RDW: 17.5 % — ABNORMAL HIGH (ref 11.5–15.5)
WBC: 6.8 10*3/uL (ref 4.0–10.5)
nRBC: 0 % (ref 0.0–0.2)

## 2021-02-09 LAB — URINALYSIS, ROUTINE W REFLEX MICROSCOPIC
Bilirubin Urine: NEGATIVE
Glucose, UA: NEGATIVE mg/dL
Hgb urine dipstick: NEGATIVE
Ketones, ur: NEGATIVE mg/dL
Leukocytes,Ua: NEGATIVE
Nitrite: NEGATIVE
Protein, ur: NEGATIVE mg/dL
Specific Gravity, Urine: 1.018 (ref 1.005–1.030)
pH: 6 (ref 5.0–8.0)

## 2021-02-09 LAB — BASIC METABOLIC PANEL
Anion gap: 8 (ref 5–15)
BUN: 20 mg/dL (ref 8–23)
CO2: 25 mmol/L (ref 22–32)
Calcium: 9.2 mg/dL (ref 8.9–10.3)
Chloride: 104 mmol/L (ref 98–111)
Creatinine, Ser: 1.24 mg/dL (ref 0.61–1.24)
GFR, Estimated: >60 mL/min (ref >60)
Glucose, Bld: 95 mg/dL (ref 70–99)
Potassium: 3.7 mmol/L (ref 3.5–5.1)
Sodium: 137 mmol/L (ref 135–145)

## 2021-02-09 MED ORDER — CYCLOBENZAPRINE HCL 10 MG PO TABS: 10.0000 mg | ORAL_TABLET | Freq: Two times a day (BID) | ORAL | 0 refills | Status: DC | PRN

## 2021-02-09 NOTE — ED Notes (Signed)
Pt teaching provided on medications that may cause drowsiness. Pt instructed not to drive or operate heavy machinery while taking the prescribed medication. Pt verbalized understanding.  ? ?Pt provided discharge instructions and prescription information. Pt was given the opportunity to ask questions and questions were answered. Discharge signature not obtained in the setting of the COVID-19 pandemic in order to reduce high touch surfaces.  ? ?

## 2021-02-09 NOTE — Discharge Instructions (Addendum)
You have been seen here for neck and back. I recommend taking over-the-counter pain medications like ibuprofen and/or Tylenol every 6 as needed.  Please follow dosage and on the back of bottle.  I also recommend applying heat to the area and stretching out the muscles as this will help decrease stiffness and pain.  I have given you information on exercises please follow. I have also given you a prescription for a muscle relaxer this can make you drowsy do not consume alcohol or operate heavy machinery when taking this medication.   Please follow-up your PCP for further evaluation.  Come back to the emergency department if you develop chest pain, shortness of breath, severe abdominal pain, uncontrolled nausea, vomiting, diarrhea.

## 2021-02-09 NOTE — ED Provider Notes (Signed)
[facility] EMERGENCY DEPARTMENT Provider Note   CSN: [id number] Arrival date & time: 02/09/21  0900     History Chief Complaint  Patient presents with   Neck Pain    Robert Durham is a 65 y.o. male.  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  Patient states pain started 4 days ago, pain came on suddenly, he will have occasional paresthesias in the upper and lower extremities, he denies recent trauma to the area, not associated with fevers or chills, denies history of IV drug use, states he has increased urinary frequency but denies dysuria, hematuria, denies flank pain.  Patient states pain has stayed persistent, worsened with movement, mainly feels a pain in his left lower back but pain does not radiate into his legs.  He has no other complaints at this time.  Does not endorse fevers, chills, chest pain, shortness of breath, abdominal pain, worsening pedal edema.  Past Medical History:  Diagnosis Date   Chronic back pain    Chronic hip pain    Chronic nausea    Depression    Diabetes mellitus    Diverticulitis    Gastritis    H/O kidney transplant    Hypertension    Kidney stone    Renal disorder    Urinary retention     Patient Active Problem List   Diagnosis Date Noted   Right inguinal hernia    Chest pain 03/20/2016    Past Surgical History:  Procedure Laterality Date   COLONOSCOPY  05/27/2003   [id number] terminal ileum,rectum, and colon   HERNIA REPAIR     HIP SURGERY Left    INGUINAL HERNIA REPAIR Right 03/02/2019   Procedure: HERNIA REPAIR INGUINAL ADULT WITH MESH;  Surgeon: Aviva Signs, MD;  Location: [facility];  Service: General;  Laterality: Right;   KIDNEY TRANSPLANT     NEPHRECTOMY TRANSPLANTED ORGAN Left        Family History  Problem Relation  Age of Onset   Hypertension Sister     Social History   Tobacco Use   Smoking status: Former    Packs/day: 0.00    Years: 5.00    Pack years: 0.00    Types: Cigarettes    Quit date: 06/14/1972    Years since quitting: 48.6   Smokeless tobacco: Never  Vaping Use   Vaping Use: Never used  Substance Use Topics   Alcohol use: No   Drug use: No    Home Medications Prior to Admission medications   Medication Sig Start Date End Date Taking? Authorizing Provider  cyclobenzaprine (FLEXERIL) 10 MG tablet Take 1 tablet (10 mg total) by mouth 2 (two) times daily as needed for muscle spasms. 02/09/21  Yes Marcello Fennel, PA-C  amoxicillin (AMOXIL) 500 MG capsule Take 1 capsule (500 mg total) by mouth 3 (three) times daily. 11/01/19   Fransico Meadow, PA-C  atorvastatin (LIPITOR) 10 MG tablet Take 10 mg by mouth daily with breakfast.     [provider]  cycloSPORINE (SANDIMMUNE) 100 MG capsule Take 100 mg by mouth 2 (two) times daily. Take with 25 mg tablet to equal 125 mg twice daily    [provider]  cycloSPORINE (SANDIMMUNE) 25 MG capsule Take 25 mg by mouth 2 (two) times daily. Take with 100 mg tablet to equal dose  of 125 mg twice daily    [provider]  esomeprazole (NEXIUM) 40 MG capsule Take 40 mg by mouth daily.     [provider]  HYDROcodone-acetaminophen (NORCO) 5-325 MG tablet Take 1 tablet by mouth every 4 (four) hours as needed for moderate pain. 03/08/19 04/17/19  Aviva Signs, MD  predniSONE (DELTASONE) 5 MG tablet Take 5 mg by mouth daily with breakfast.     [provider]  promethazine (PHENERGAN) 25 MG tablet Take 25 mg by mouth every 6 (six) hours as needed for nausea or vomiting.    [provider]  tamsulosin (FLOMAX) 0.4 MG CAPS capsule Take 0.4 mg by mouth daily.     [provider]  vitamin B-12 (CYANOCOBALAMIN) 1000 MCG tablet Take 1,000 mcg by mouth daily.    [provider]  vitamin C  (ASCORBIC ACID) 500 MG tablet Take 500 mg by mouth daily. 12/23/14   [provider]    Allergies    Codeine, Enalapril maleate, Nsaids, Tape, Tolmetin, and Vasotec  Review of Systems   Review of Systems  Constitutional:  Negative for chills and fever.  HENT:  Negative for congestion.   Respiratory:  Negative for shortness of breath.   Cardiovascular:  Negative for chest pain.  Gastrointestinal:  Negative for abdominal pain.  Genitourinary:  Negative for enuresis.  Musculoskeletal:  Positive for back pain.  Skin:  Negative for rash.  Neurological:  Negative for dizziness.  Hematological:  Does not bruise/bleed easily.   Physical Exam Updated Vital Signs BP 110/84 (BP Location: Right Arm)   Pulse 67   Temp 97.8 F (36.6 C) (Oral)   Resp 18   Ht '5\' 11"'$  (1.803 m)   Wt 87.1 kg   SpO2 100%   BMI 26.78 kg/m   Physical Exam Vitals and nursing note reviewed.  Constitutional:      General: He is not in acute distress.    Appearance: He is not ill-appearing.  HENT:     Head: Normocephalic and atraumatic.     Nose: No congestion.  Eyes:     Conjunctiva/sclera: Conjunctivae normal.  Cardiovascular:     Rate and Rhythm: Normal rate and regular rhythm.     Pulses: Normal pulses.     Heart sounds: No murmur heard.   No friction rub. No gallop.  Pulmonary:     Effort: No respiratory distress.     Breath sounds: No wheezing, rhonchi or rales.  Musculoskeletal:     Comments: Spine was palpated was nontender to palpation, no step-off deformities present, he has pain noted in the musculature above the left iliac crest.  He has full range of motion 5 5 strength the upper and lower extremities.  Positive straight leg raise on the left side.  Skin:    General: Skin is warm and dry.  Neurological:     Mental Status: He is alert.  Psychiatric:        Mood and Affect: Mood normal.    ED Results / Procedures / Treatments   Labs (all labs ordered are listed, but only  abnormal results are displayed) Labs Reviewed  CBC WITH DIFFERENTIAL/PLATELET - Abnormal; Notable for the following components:      Result Value   RBC 6.15 (*)    MCV 72.4 (*)    MCH 22.3 (*)    RDW 17.5 (*)    All other components within normal limits  URINALYSIS, ROUTINE W REFLEX MICROSCOPIC  BASIC METABOLIC PANEL  EKG None  Radiology No results found.  Procedures Procedures   Medications Ordered in ED Medications - No data to display  ED Course  I have reviewed the triage vital signs and the nursing notes.  Pertinent labs & imaging results that were available during my care of the patient were reviewed by me and considered in my medical decision making (see chart for details).    MDM Rules/Calculators/A&P                           Initial impression-patient presents with back pain.  He is alert, does not appear acute stress, vital signs reassuring.  Due to his history of kidney transplant will obtain basic lab work-up UA and reassess.  Work-up-CBC unremarkable, BMP unremarkable, UA unremarkable   Rule out- I have low suspicion for spinal fracture or spinal cord abnormality as patient denies urinary incontinency, retention, difficulty with bowel movements, denies saddle paresthesias.  Spine was palpated there is no step-off, crepitus or gross deformities felt, patient had 5/5 strength, full range of motion, neurovascular fully intact in the upper and lower extremities.  Will defer imaging as there is no trauma associated with this injury. Low suspicion for septic arthritis as patient denies IV drug use, skin exam was performed no erythematous, edema or warm joints noted.  Low suspicion for UTI, [place] patient is nontoxic-appearing, vital signs reassuring, no CVA tenderness, UA negative for low suspicion for AAA or dissection as presentation is atypical of etiology, patient has low risk factors, previous imaging was examined no noted aneurysm present on CT abdomen pelvis 2  years ago.   Plan-  Back and neck pain-likely muscular in nature, will prescribe him a muscle relaxer, recommend plain heat to the area, stretching it out, follow-up PCP as needed.  Vital signs have remained stable, no indication for hospital admission.  Patient discussed with attending and they agreed with assessment and plan.  Patient given at home care as well strict return precautions.  Patient verbalized that they understood agreed to said plan.  Final Clinical Impression(s) / ED Diagnoses Final diagnoses:  Neck pain  Acute left-sided low back pain without sciatica    Rx / DC Orders ED Discharge Orders          Ordered    cyclobenzaprine (FLEXERIL) 10 MG tablet  2 times daily PRN        02/09/21 1149             Aron Baba 02/09/21 1151    Noemi Chapel, MD 02/10/21 [phone]

## 2021-02-09 NOTE — ED Triage Notes (Signed)
Pt c/o of neck pain and back pain x 4 days.

## 2021-02-18 ENCOUNTER — Ambulatory Visit (HOSPITAL_COMMUNITY): Payer: 59

## 2021-02-19 ENCOUNTER — Ambulatory Visit (HOSPITAL_COMMUNITY): Payer: 59 | Attending: Family Medicine

## 2021-02-19 ENCOUNTER — Other Ambulatory Visit: Payer: Self-pay

## 2021-02-19 ENCOUNTER — Encounter (HOSPITAL_COMMUNITY): Payer: Self-pay

## 2021-02-19 DIAGNOSIS — R29898 Other symptoms and signs involving the musculoskeletal system: Secondary | ICD-10-CM | POA: Diagnosis present

## 2021-02-19 DIAGNOSIS — M25612 Stiffness of left shoulder, not elsewhere classified: Secondary | ICD-10-CM

## 2021-02-19 DIAGNOSIS — M542 Cervicalgia: Secondary | ICD-10-CM

## 2021-02-19 DIAGNOSIS — M25512 Pain in left shoulder: Secondary | ICD-10-CM

## 2021-02-19 NOTE — Patient Instructions (Signed)
Complete the following exercises 1-2 times a day.  4) AROM: Neck Rotation   Turn head slowly to look over one shoulder, then the other. Hold each position __2__ seconds. Repeat __10__ times per set.  Do __1-2__ sessions per day.  http://orth.exer.us/294   Copyright  VHI. All rights reserved.         Scapular Retraction (Standing)   With arms at sides, pinch shoulder blades together. Repeat __10__ times per set. Do __1__ sets per session. Do __2__ sessions per day.   Wall Flexion  Slide your arm up the wall or door frame until a stretch is felt in your shoulder . Hold for 20-30 seconds. Complete 2 times

## 2021-02-19 NOTE — Therapy (Signed)
[place], [place], [address] Phone: [phone]   Fax:  [phone]  Occupational Therapy Evaluation  Patient Details  Name: Robert Durham MRN: [id number] Date of Birth: October 09, 1955 Referring Provider (OT): Iona Beard, MD   Encounter Date: 02/19/2021   OT End of Session - 02/19/21 1401     Visit Number 1    Number of Visits 8    Date for OT Re-Evaluation 03/19/21    Authorization Type U[employer]    Authorization Time Period no visit limit, no copay, no authorization    OT Start Time 1037    OT Stop Time 1113    OT Time Calculation (min) 36 min    Activity Tolerance Patient tolerated treatment well    Behavior During Therapy [facility] for tasks assessed/performed             Past Medical History:  Diagnosis Date   Chronic back pain    Chronic hip pain    Chronic nausea    Depression    Diabetes mellitus    Diverticulitis    Gastritis    H/O kidney transplant    Hypertension    Kidney stone    Renal disorder    Urinary retention     Past Surgical History:  Procedure Laterality Date   COLONOSCOPY  05/27/2003   [id number] terminal ileum,rectum, and colon   HERNIA REPAIR     HIP SURGERY Left    INGUINAL HERNIA REPAIR Right 03/02/2019   Procedure: HERNIA REPAIR INGUINAL ADULT WITH MESH;  Surgeon: Aviva Signs, MD;  Location: [facility];  Service: General;  Laterality: Right;   KIDNEY TRANSPLANT     NEPHRECTOMY TRANSPLANTED ORGAN Left     There were no vitals filed for this visit.   Subjective Assessment - 02/19/21 1042     Subjective  S: It started hurting for no reason and then it goes up into my neck.    Pertinent History Patient is a 65 y/o male S/P left shoulder pain which started approximately 1 month or more. He does not recall any incident that caused it. Dr. Berdine Addison has referred patient to occupational therapy for evaluation and treatment.    Patient Stated Goals To decrease the pain.    Currently in  Pain? Yes    Pain Score 8     Pain Location Shoulder    Pain Orientation Left    Pain Descriptors / Indicators Aching;Constant    Pain Type Acute pain    Pain Radiating Towards neck    Pain Onset More than a month ago    Pain Frequency Constant    Aggravating Factors  turning head too fast to the left, way he lays back in the bed    Pain Relieving Factors nothing yet    Effect of Pain on Daily Activities severe effect    Multiple Pain Sites No               [facility] OT Assessment - 02/19/21 1044       Assessment   Medical Diagnosis left shoulder pain    Referring Provider (OT) Iona Beard, MD    Onset Date/Surgical Date --   over a month ago   Hand Dominance Left    Next MD Visit 03/02/21    Prior Therapy None      Precautions   Precautions None      Restrictions   Weight Bearing Restrictions No  Balance Screen   Has the patient fallen in the past 6 months No      Home  Environment   Family/patient expects to be discharged to: Private residence      Prior Function   Level of Independence Independent    Vocation On disability      ADL   ADL comments Difficulty driving, any time he needs to turn or twist is the left, trying to sleep, lifting and reaching      Written Expression   Dominant Hand Left      Vision - History   Baseline Vision No visual deficits      Cognition   Overall Cognitive Status Within Functional Limits for tasks assessed      Observation/Other Assessments   Focus on Therapeutic Outcomes (FOTO)  53/100      Posture/Postural Control   Posture/Postural Control Postural limitations    Postural Limitations Rounded Shoulders;Forward head      ROM / Strength   AROM / PROM / Strength AROM;PROM;Strength      Palpation   Palpation comment Max fascial restrictions  palpated in the left upper arm, upper trapezius, and scapularis region.      AROM   Overall AROM Comments Assessed seated. IR/er abducted    AROM Assessment Site  Shoulder;Cervical    Right/Left Shoulder Left    Left Shoulder Flexion 135 Degrees    Left Shoulder ABduction 115 Degrees    Left Shoulder Internal Rotation 60 Degrees    Left Shoulder External Rotation 30 Degrees    Cervical Flexion --   WFL   Cervical Extension --   [facility]   Cervical - Right Side Bend 33    Cervical - Left Side Bend 33    Cervical - Right Rotation --   WFL   Cervical - Left Rotation 33      PROM   Overall PROM Comments Assessed supine. IR/er abducted    PROM Assessment Site Shoulder    Right/Left Shoulder Left    Left Shoulder Flexion 142 Degrees    Left Shoulder ABduction 180 Degrees    Left Shoulder Internal Rotation 65 Degrees    Left Shoulder External Rotation 90 Degrees      Strength   Overall Strength Unable to assess;Due to pain                              OT Education - 02/19/21 1400     Education Details cervical rotation, shoulder flexion stretch, scapular squeezes    Person(s) Educated Patient    Methods Explanation;Demonstration;Handout    Comprehension Verbalized understanding;Returned demonstration              OT Short Term Goals - 02/19/21 1407       OT SHORT TERM GOAL #1   Title Patient will be educated and independent with HEP in order to faciliate his progress in therapy and allow him to return to using his LUE for all daily tasks 75% or more of the time.    Time 4    Period Weeks    Status New    Target Date 03/19/21      OT SHORT TERM GOAL #2   Title Patient will increase his LUE A/ROM to [facility] in order to complete functional reaching tasks that require reaching above shoulder level.    Time 4    Period Weeks    Status New  OT SHORT TERM GOAL #3   Title Patient will increase his LUE strength to 4+/5 in order to return to using his LUE  for all daily tasks.    Time 4    Period Weeks    Status New      OT SHORT TERM GOAL #4   Title Patient will report a decrease in pain level of approximately 4/10  or less when using his LUE for daily tasks.    Time 4    Period Weeks    Status New      OT SHORT TERM GOAL #5   Title Patient will decrease his LUE fascial restrictions to min amount in order to increase the functional mobility needed to complete all required tasks.    Time 4    Period Weeks    Status New                      Plan - 02/19/21 1403     Clinical Impression Statement A: Patient is a39 y/o male S/P left shoulder pain causing increased fascial restrictions and decreased ROM and strength resulting in difficulty driving as well as using his LUE to complete all basic ADL tasks.    OT Occupational Profile and History Problem Focused Assessment - Including review of records relating to presenting problem    Occupational performance deficits (Please refer to evaluation for details): IADL's;ADL's;Rest and Sleep    Body Structure / Function / Physical Skills ADL;UE functional use;Fascial restriction;Pain;ROM;IADL    Rehab Potential Good    Clinical Decision Making Limited treatment options, no task modification necessary    Comorbidities Affecting Occupational Performance: None    Modification or Assistance to Complete Evaluation  No modification of tasks or assist necessary to complete eval    OT Frequency 2x / week    OT Duration 4 weeks    OT Treatment/Interventions Self-care/ADL training;Ultrasound;Patient/family education;Passive range of motion;Cryotherapy;Electrical Stimulation;Moist Heat;Therapeutic exercise;Therapeutic activities;Manual Therapy;Neuromuscular education    Plan P: Pt will benefit from skilled OT services to increase functional performance and return to using his left UE for daily tasks. Treatment plan: Myofascial release and passive stretching. Focus on decreasing pain and muscle guarding to allow him to tolerate ROM exercises. A/ROM, general shoulder and scapular strengthening. Modalities PRN.    OT Home Exercise Plan eval: cervial rotation,  scapular squeezes, shoulder flexion stretch    Consulted and Agree with Plan of Care Patient             Patient will benefit from skilled therapeutic intervention in order to improve the following deficits and impairments:   Body Structure / Function / Physical Skills: ADL, UE functional use, Fascial restriction, Pain, ROM, IADL       Visit Diagnosis: Cervicalgia - Plan: Ot plan of care cert/re-cert  Other symptoms and signs involving the musculoskeletal system - Plan: Ot plan of care cert/re-cert  Stiffness of left shoulder, not elsewhere classified - Plan: Ot plan of care cert/re-cert  Acute pain of left shoulder - Plan: Ot plan of care cert/re-cert    Problem List Patient Active Problem List   Diagnosis Date Noted   Right inguinal hernia    Chest pain 03/20/2016    Ailene Ravel, OTR/L,CBIS  [phone]  02/19/2021, 2:18 PM  [place] [address] [place], [place], [address] Phone: [phone]   Fax:  [phone]  Name: RAMONA CLARIDA MRN: [id number] Date of Birth: [birth year]

## 2021-02-23 ENCOUNTER — Other Ambulatory Visit: Payer: Self-pay

## 2021-02-23 ENCOUNTER — Ambulatory Visit (HOSPITAL_COMMUNITY): Payer: 59

## 2021-02-23 ENCOUNTER — Encounter (HOSPITAL_COMMUNITY): Payer: Self-pay

## 2021-02-23 DIAGNOSIS — M542 Cervicalgia: Secondary | ICD-10-CM

## 2021-02-23 DIAGNOSIS — M25512 Pain in left shoulder: Secondary | ICD-10-CM

## 2021-02-23 DIAGNOSIS — M25612 Stiffness of left shoulder, not elsewhere classified: Secondary | ICD-10-CM

## 2021-02-23 DIAGNOSIS — R29898 Other symptoms and signs involving the musculoskeletal system: Secondary | ICD-10-CM

## 2021-02-23 NOTE — Patient Instructions (Signed)
Complete these stretches throughout the day when needed.   Cervical Side Bend  Grap chair and pull away while you side bend neck to opposite side.  Hold for 30 count and apply slight overpressure with hand. Complete twice.     LEVATOR SCAPULAE STRETCH - HOLDING CHAIR AND TOP OF HEAD  Grab the chair seat and then tilt your head to the other side, then rotate to the side, then tip downward as in looking at your opposite pocket.   Use your other hand and apply over pressure by gentling pulling.   You should be looking towards your opposite pocket of the target side.  Hold for 30 seconds. Complete 2 times.

## 2021-02-24 NOTE — Therapy (Signed)
[place], [place], [address] Phone: [phone]   Fax:  [phone]  Occupational Therapy Treatment  Patient Details  Name: Robert Durham MRN: [id number] Date of Birth: 1956/04/13 Referring Provider (OT): Iona Beard, MD   Encounter Date: 02/23/2021   OT End of Session - 02/23/21 1635     Visit Number 2    Number of Visits 8    Date for OT Re-Evaluation 03/19/21    Authorization Type UHC    Authorization Time Period no visit limit, no copay, no authorization    OT Start Time 1600    OT Stop Time 1638    OT Time Calculation (min) 38 min    Activity Tolerance Patient tolerated treatment well    Behavior During Therapy [facility] for tasks assessed/performed             Past Medical History:  Diagnosis Date   Chronic back pain    Chronic hip pain    Chronic nausea    Depression    Diabetes mellitus    Diverticulitis    Gastritis    H/O kidney transplant    Hypertension    Kidney stone    Renal disorder    Urinary retention     Past Surgical History:  Procedure Laterality Date   COLONOSCOPY  05/27/2003   [id number] terminal ileum,rectum, and colon   HERNIA REPAIR     HIP SURGERY Left    INGUINAL HERNIA REPAIR Right 03/02/2019   Procedure: HERNIA REPAIR INGUINAL ADULT WITH MESH;  Surgeon: Aviva Signs, MD;  Location: [facility];  Service: General;  Laterality: Right;   KIDNEY TRANSPLANT     NEPHRECTOMY TRANSPLANTED ORGAN Left     There were no vitals filed for this visit.   Subjective Assessment - 02/23/21 1608     Subjective  S: It's feeling a whole lot better.    Currently in Pain? Yes    Pain Score 5     Pain Location Shoulder    Pain Orientation Left    Pain Descriptors / Indicators Sore;Constant    Pain Type Acute pain    Pain Radiating Towards neck    Pain Onset More than a month ago    Pain Frequency Constant    Aggravating Factors  turning head to fast to the left, way he lays back  in the bed    Pain Relieving Factors HEP, heat    Effect of Pain on Daily Activities min effect    Multiple Pain Sites No                [facility] OT Assessment - 02/23/21 1629       Assessment   Medical Diagnosis left shoulder pain      Precautions   Precautions None                      OT Treatments/Exercises (OP) - 02/23/21 1630       Exercises   Exercises Shoulder;Neck      Neck Exercises: Stretches   Levator Stretch Left;1 rep;60 seconds    Other Neck Stretches left side cervical stretch; 1x30"      Shoulder Exercises: ROM/Strengthening   UBE (Upper Arm Bike) level 2 ' 2' reverse pace: 8.0    Ball on Wall 1' flexion 1' abduction red ball      Manual Therapy   Manual Therapy Myofascial release    Manual therapy comments  Manual therapy completed prior to exercises.    Myofascial Release Myofascial release and manual stretching completed to left upper trapezius and cervical region to decrease fascial restriction and increase joint mobility in a pain free zone.                    OT Education - 02/23/21 1628     Education Details cervical stretches. reviewed therapy goals    Person(s) Educated Patient    Methods Explanation;Demonstration;Handout;Verbal cues    Comprehension Verbalized understanding;Returned demonstration              OT Short Term Goals - 02/23/21 1609       OT SHORT TERM GOAL #1   Title Patient will be educated and independent with HEP in order to faciliate his progress in therapy and allow him to return to using his LUE for all daily tasks 75% or more of the time.    Time 4    Period Weeks    Status On-going    Target Date 03/19/21      OT SHORT TERM GOAL #2   Title Patient will increase his LUE A/ROM to [facility] in order to complete functional reaching tasks that require reaching above shoulder level.    Time 4    Period Weeks    Status On-going      OT SHORT TERM GOAL #3   Title Patient will increase his LUE  strength to 4+/5 in order to return to using his LUE  for all daily tasks.    Time 4    Period Weeks    Status On-going      OT SHORT TERM GOAL #4   Title Patient will report a decrease in pain level of approximately 4/10 or less when using his LUE for daily tasks.    Time 4    Period Weeks    Status On-going      OT SHORT TERM GOAL #5   Title Patient will decrease his LUE fascial restrictions to min amount in order to increase the functional mobility needed to complete all required tasks.    Time 4    Period Weeks    Status On-going                      Plan - 02/24/21 0841     Clinical Impression Statement A: Initiated myofascial release to address fascial restrictions located in the left upper trapezius and cervical region. patient reports that he has significantly less pain than he did on initial evaluation. His HEP is going well. Patient presents with full passive and A/ROM. cervical stretches completed and added to HEP. Moderate difficulty demonstrated with ball on the wall exercise for proximal shoulder stability. VC for form and technique provided.    Plan P: Scapular strengthening with band.    OT Home Exercise Plan eval: cervial rotation, scapular squeezes, shoulder flexion stretch 9/12: cervical stretches.    Consulted and Agree with Plan of Care Patient             Patient will benefit from skilled therapeutic intervention in order to improve the following deficits and impairments:           Visit Diagnosis: Other symptoms and signs involving the musculoskeletal system  Cervicalgia  Stiffness of left shoulder, not elsewhere classified  Acute pain of left shoulder    Problem List Patient Active Problem List   Diagnosis Date Noted   Right inguinal  hernia    Chest pain 03/20/2016   Ailene Ravel, OTR/L,CBIS  [phone]  02/24/2021, 8:44 AM  [place], [place],  [address] Phone: [phone]   Fax:  [phone]  Name: Robert Durham MRN: [id number] Date of Birth: [birth year]

## 2021-02-26 ENCOUNTER — Ambulatory Visit (HOSPITAL_COMMUNITY): Payer: 59

## 2021-02-26 ENCOUNTER — Other Ambulatory Visit: Payer: Self-pay

## 2021-02-26 ENCOUNTER — Encounter (HOSPITAL_COMMUNITY): Payer: Self-pay

## 2021-02-26 DIAGNOSIS — R29898 Other symptoms and signs involving the musculoskeletal system: Secondary | ICD-10-CM

## 2021-02-26 DIAGNOSIS — M542 Cervicalgia: Secondary | ICD-10-CM

## 2021-02-26 DIAGNOSIS — M25512 Pain in left shoulder: Secondary | ICD-10-CM

## 2021-02-26 DIAGNOSIS — M25612 Stiffness of left shoulder, not elsewhere classified: Secondary | ICD-10-CM

## 2021-02-26 NOTE — Therapy (Signed)
[place], [place], [address] Phone: [phone]   Fax:  [phone]  Occupational Therapy Treatment  Patient Details  Name: Robert Durham MRN: [id number] Date of Birth: 07-Mar-1956 Referring Provider (OT): Iona Beard, MD   Encounter Date: 02/26/2021   OT End of Session - 02/26/21 1640     Visit Number 3    Number of Visits 8    Date for OT Re-Evaluation 03/19/21    Authorization Type UHC    Authorization Time Period no visit limit, no copay, no authorization    OT Start Time 1600    OT Stop Time 1638    OT Time Calculation (min) 38 min    Activity Tolerance Patient tolerated treatment well    Behavior During Therapy [facility] for tasks assessed/performed             Past Medical History:  Diagnosis Date   Chronic back pain    Chronic hip pain    Chronic nausea    Depression    Diabetes mellitus    Diverticulitis    Gastritis    H/O kidney transplant    Hypertension    Kidney stone    Renal disorder    Urinary retention     Past Surgical History:  Procedure Laterality Date   COLONOSCOPY  05/27/2003   [id number] terminal ileum,rectum, and colon   HERNIA REPAIR     HIP SURGERY Left    INGUINAL HERNIA REPAIR Right 03/02/2019   Procedure: HERNIA REPAIR INGUINAL ADULT WITH MESH;  Surgeon: Aviva Signs, MD;  Location: [facility];  Service: General;  Laterality: Right;   KIDNEY TRANSPLANT     NEPHRECTOMY TRANSPLANTED ORGAN Left     There were no vitals filed for this visit.   Subjective Assessment - 02/26/21 1612     Subjective  S: it's been feeling really good since last time.    Currently in Pain? No/denies                [facility] OT Assessment - 02/26/21 1613       Assessment   Medical Diagnosis left shoulder pain      Precautions   Precautions None                      OT Treatments/Exercises (OP) - 02/26/21 1613       Exercises   Exercises Shoulder      Shoulder  Exercises: Standing   Extension Theraband;12 reps    Theraband Level (Shoulder Extension) Level 3 (Green)    Row Theraband;12 reps    Theraband Level (Shoulder Row) Level 4 (Blue)    Retraction Theraband;12 reps    Theraband Level (Shoulder Retraction) Level 4 (Blue)      Shoulder Exercises: Therapy Ball   Other Therapy Ball Exercises green therapy ball; 12X, flexion, circles (left/right), chest press      Shoulder Exercises: ROM/Strengthening   UBE (Upper Arm Bike) level 3' 3 reverse pace: 15.0    Over Head Lace seated, 1# wrist weight on Left wrist. started at top of chain, laced to bottom then removed.    Ball on Wall 1' flexion 1' abduction red ball      Manual Therapy   Manual Therapy Myofascial release    Manual therapy comments Manual therapy completed prior to exercises.    Myofascial Release Myofascial release completed to left upper trapezius and cervical region to decrease fascial restriction and  increase joint mobility in a pain free zone.                      OT Short Term Goals - 02/23/21 1609       OT SHORT TERM GOAL #1   Title Patient will be educated and independent with HEP in order to faciliate his progress in therapy and allow him to return to using his LUE for all daily tasks 75% or more of the time.    Time 4    Period Weeks    Status On-going    Target Date 03/19/21      OT SHORT TERM GOAL #2   Title Patient will increase his LUE A/ROM to [facility] in order to complete functional reaching tasks that require reaching above shoulder level.    Time 4    Period Weeks    Status On-going      OT SHORT TERM GOAL #3   Title Patient will increase his LUE strength to 4+/5 in order to return to using his LUE  for all daily tasks.    Time 4    Period Weeks    Status On-going      OT SHORT TERM GOAL #4   Title Patient will report a decrease in pain level of approximately 4/10 or less when using his LUE for daily tasks.    Time 4    Period Weeks     Status On-going      OT SHORT TERM GOAL #5   Title Patient will decrease his LUE fascial restrictions to min amount in order to increase the functional mobility needed to complete all required tasks.    Time 4    Period Weeks    Status On-going                      Plan - 02/26/21 1647     Clinical Impression Statement A: Completed myofasical release to address min fascial restrictions in the left upper trapezius and cervical region. No passive stretching needed. Patient demonstrates full passive ROM. Focused session on scapular and shoulder stability and strength. Provided VC for form and technique during session. Corrections were made with additional feedback from OT. No pain noted.    Body Structure / Function / Physical Skills ADL;UE functional use;Fascial restriction;Pain;ROM;IADL    Plan P: Possible discharge next session if still having no pain or any previous issues.    Consulted and Agree with Plan of Care Patient             Patient will benefit from skilled therapeutic intervention in order to improve the following deficits and impairments:   Body Structure / Function / Physical Skills: ADL, UE functional use, Fascial restriction, Pain, ROM, IADL       Visit Diagnosis: Cervicalgia  Stiffness of left shoulder, not elsewhere classified  Acute pain of left shoulder  Other symptoms and signs involving the musculoskeletal system    Problem List Patient Active Problem List   Diagnosis Date Noted   Right inguinal hernia    Chest pain 03/20/2016    Robert Durham, OTR/L,CBIS  [phone]  02/26/2021, 4:50 PM  [place] [address] [place], [place], [address] Phone: [phone]   Fax:  [phone]  Name: Robert Durham MRN: [id number] Date of Birth: [birth year]

## 2021-03-03 ENCOUNTER — Other Ambulatory Visit: Payer: Self-pay

## 2021-03-03 ENCOUNTER — Encounter (HOSPITAL_COMMUNITY): Payer: 59

## 2021-03-03 ENCOUNTER — Encounter (HOSPITAL_COMMUNITY): Payer: Self-pay

## 2021-03-03 ENCOUNTER — Ambulatory Visit (HOSPITAL_COMMUNITY): Payer: 59

## 2021-03-03 DIAGNOSIS — M25512 Pain in left shoulder: Secondary | ICD-10-CM

## 2021-03-03 DIAGNOSIS — M542 Cervicalgia: Secondary | ICD-10-CM | POA: Diagnosis not present

## 2021-03-03 DIAGNOSIS — M25612 Stiffness of left shoulder, not elsewhere classified: Secondary | ICD-10-CM

## 2021-03-03 DIAGNOSIS — R29898 Other symptoms and signs involving the musculoskeletal system: Secondary | ICD-10-CM

## 2021-03-03 NOTE — Patient Instructions (Signed)

## 2021-03-03 NOTE — Therapy (Signed)
[place], [place], [address] Phone: [phone]   Fax:  [phone]  Occupational Therapy Treatment Reassessment/discharge Patient Details  Name: BRAYLYN KALTER MRN: [id number] Date of Birth: 08-31-1955 Referring Provider (OT): Iona Beard, MD   Encounter Date: 03/03/2021   OT End of Session - 03/03/21 1622     Visit Number 4    Number of Visits 8    Authorization Type [employer]    Authorization Time Period no visit limit, no copay, no authorization    OT Start Time 7048   reassess and discharge   OT Stop Time 1338    OT Time Calculation (min) 34 min    Activity Tolerance Patient tolerated treatment well    Behavior During Therapy [facility] for tasks assessed/performed             Past Medical History:  Diagnosis Date   Chronic back pain    Chronic hip pain    Chronic nausea    Depression    Diabetes mellitus    Diverticulitis    Gastritis    H/O kidney transplant    Hypertension    Kidney stone    Renal disorder    Urinary retention     Past Surgical History:  Procedure Laterality Date   COLONOSCOPY  05/27/2003   GQB:[id number] terminal ileum,rectum, and colon   HERNIA REPAIR     HIP SURGERY Left    INGUINAL HERNIA REPAIR Right 03/02/2019   Procedure: HERNIA REPAIR INGUINAL ADULT WITH MESH;  Surgeon: Aviva Signs, MD;  Location: [facility];  Service: General;  Laterality: Right;   KIDNEY TRANSPLANT     NEPHRECTOMY TRANSPLANTED ORGAN Left     There were no vitals filed for this visit.       [facility] OT Assessment - 03/03/21 1305       Assessment   Medical Diagnosis left shoulder pain      Precautions   Precautions None      Observation/Other Assessments   Focus on Therapeutic Outcomes (FOTO)  100/100      ROM / Strength   AROM / PROM / Strength AROM;PROM;Strength      Palpation   Palpation comment min fascial restrictions in the left upper arm, upper trapezius, and scapularis region.       AROM   Overall AROM Comments Assessed seated. IR/er abducted    AROM Assessment Site Shoulder    Right/Left Shoulder Left    Left Shoulder Flexion 170 Degrees   previous: 135   Left Shoulder ABduction 170 Degrees   previous: 115   Left Shoulder Internal Rotation 65 Degrees   previous: 60   Left Shoulder External Rotation 85 Degrees   previous: 30   Cervical - Right Side Bend 40   previous: 33   Cervical - Left Side Bend 45   previous: 33   Cervical - Left Rotation 80   previous: 33     PROM   Overall PROM Comments Assessed supine. IR/er abducted    PROM Assessment Site Shoulder    Right/Left Shoulder Left    Left Shoulder Flexion 180 Degrees   previous: 142   Left Shoulder ABduction 180 Degrees   previous: same   Left Shoulder Internal Rotation 80 Degrees   previous: 65   Left Shoulder External Rotation 90 Degrees   previous: same     Strength   Overall Strength Comments Assessed seated. IR/er abducted. Strength note assessed on evaluation  Strength Assessment Site Shoulder    Right/Left Shoulder Left    Left Shoulder Flexion 5/5    Left Shoulder ABduction 5/5    Left Shoulder Internal Rotation 5/5    Left Shoulder External Rotation 5/5                      OT Treatments/Exercises (OP) - 03/03/21 1332       Exercises   Exercises Shoulder      Shoulder Exercises: Standing   Extension Theraband;15 reps    Theraband Level (Shoulder Extension) Level 4 (Blue)    Row Theraband;15 reps    Theraband Level (Shoulder Row) Level 4 (Blue)    Retraction Theraband;15 reps    Theraband Level (Shoulder Retraction) Level 4 (Blue)      Manual Therapy   Manual Therapy Myofascial release    Manual therapy comments Manual therapy completed prior to exercises.    Myofascial Release Myofascial release completed to left upper trapezius and cervical region to decrease fascial restriction and increase joint mobility in a pain free zone.                    OT  Education - 03/03/21 1329     Education Details scapular strengthening. Pt owns a blue theraband    Person(s) Educated Patient    Methods Explanation;Demonstration;Handout;Verbal cues    Comprehension Verbalized understanding;Returned demonstration              OT Short Term Goals - 03/03/21 1320       OT SHORT TERM GOAL #1   Title Patient will be educated and independent with HEP in order to faciliate his progress in therapy and allow him to return to using his LUE for all daily tasks 75% or more of the time.    Time 4    Period Weeks    Status Achieved    Target Date 03/19/21      OT SHORT TERM GOAL #2   Title Patient will increase his LUE A/ROM to [employer] in order to complete functional reaching tasks that require reaching above shoulder level.    Time 4    Period Weeks    Status Achieved      OT SHORT TERM GOAL #3   Title Patient will increase his LUE strength to 4+/5 in order to return to using his LUE  for all daily tasks.    Time 4    Period Weeks    Status Achieved      OT SHORT TERM GOAL #4   Title Patient will report a decrease in pain level of approximately 4/10 or less when using his LUE for daily tasks.    Time 4    Period Weeks    Status Achieved      OT SHORT TERM GOAL #5   Title Patient will decrease his LUE fascial restrictions to min amount in order to increase the functional mobility needed to complete all required tasks.    Time 4    Period Weeks    Status Achieved                      Plan - 03/03/21 1623     Clinical Impression Statement A: Reassessment completed this date as patient has reported improvement at each session with mild soreness in cervical region and no shoulder pain. Patient is able to demonstrate full A/ROM and 5/5 shoulder strength. Min fascial restrictions were  noted. HEP was reviewed. Patient was provided updated HEP. All education was completed.    Body Structure / Function / Physical Skills ADL;UE functional  use;Fascial restriction;Pain;ROM;IADL    Plan P: Discharge from OT services with HEP.    Consulted and Agree with Plan of Care Patient             Patient will benefit from skilled therapeutic intervention in order to improve the following deficits and impairments:   Body Structure / Function / Physical Skills: ADL, UE functional use, Fascial restriction, Pain, ROM, IADL       Visit Diagnosis: Stiffness of left shoulder, not elsewhere classified  Cervicalgia  Acute pain of left shoulder  Other symptoms and signs involving the musculoskeletal system    Problem List Patient Active Problem List   Diagnosis Date Noted   Right inguinal hernia    Chest pain 03/20/2016    Ailene Ravel, OTR/L,CBIS  [phone]  03/03/2021, 4:36 PM  [facility] [address] [place], [place], [address] Phone: [phone]   Fax:  [phone]  Name: SIERRA BISSONETTE MRN: [id number] Date of Birth: [birth year]

## 2021-03-04 ENCOUNTER — Encounter (HOSPITAL_COMMUNITY): Payer: 59

## 2021-03-05 ENCOUNTER — Encounter (HOSPITAL_COMMUNITY): Payer: 59 | Admitting: Occupational Therapy

## 2021-03-10 ENCOUNTER — Encounter (HOSPITAL_COMMUNITY): Payer: 59

## 2021-03-12 ENCOUNTER — Encounter (HOSPITAL_COMMUNITY): Payer: 59 | Admitting: Occupational Therapy

## 2021-03-17 ENCOUNTER — Encounter (HOSPITAL_COMMUNITY): Payer: 59

## 2021-03-19 ENCOUNTER — Encounter (HOSPITAL_COMMUNITY): Payer: 59 | Admitting: Occupational Therapy

## 2021-07-28 ENCOUNTER — Encounter (HOSPITAL_COMMUNITY): Payer: Self-pay | Admitting: *Deleted

## 2021-07-28 ENCOUNTER — Emergency Department (HOSPITAL_COMMUNITY)
Admission: EM | Admit: 2021-07-28 | Discharge: 2021-07-28 | Disposition: A | Discharge status: Home / Self Care | Payer: 59 | Attending: Emergency Medicine | Admitting: Emergency Medicine

## 2021-07-28 DIAGNOSIS — E119 Type 2 diabetes mellitus without complications: Secondary | ICD-10-CM | POA: Insufficient documentation

## 2021-07-28 DIAGNOSIS — Z940 Kidney transplant status: Secondary | ICD-10-CM | POA: Insufficient documentation

## 2021-07-28 DIAGNOSIS — M5441 Lumbago with sciatica, right side: Secondary | ICD-10-CM | POA: Diagnosis not present

## 2021-07-28 DIAGNOSIS — M545 Low back pain, unspecified: Secondary | ICD-10-CM | POA: Diagnosis present

## 2021-07-28 DIAGNOSIS — M5442 Lumbago with sciatica, left side: Secondary | ICD-10-CM | POA: Diagnosis not present

## 2021-07-28 LAB — URINALYSIS, ROUTINE W REFLEX MICROSCOPIC
Bilirubin Urine: NEGATIVE
Glucose, UA: NEGATIVE mg/dL
Hgb urine dipstick: NEGATIVE
Ketones, ur: NEGATIVE mg/dL
Leukocytes,Ua: NEGATIVE
Nitrite: NEGATIVE
Protein, ur: NEGATIVE mg/dL
Specific Gravity, Urine: 1.02 (ref 1.005–1.030)
pH: 6 (ref 5.0–8.0)

## 2021-07-28 LAB — CBC WITH DIFFERENTIAL/PLATELET
Abs Immature Granulocytes: 0.04 10*3/uL (ref 0.00–0.07)
Basophils Absolute: 0.1 10*3/uL (ref 0.0–0.1)
Basophils Relative: 1 %
Eosinophils Absolute: 0.3 10*3/uL (ref 0.0–0.5)
Eosinophils Relative: 4 %
HCT: 41.7 % (ref 39.0–52.0)
Hemoglobin: 13.1 g/dL (ref 13.0–17.0)
Immature Granulocytes: 1 %
Lymphocytes Relative: 37 %
Lymphs Abs: 2.4 10*3/uL (ref 0.7–4.0)
MCH: 22.8 pg — ABNORMAL LOW (ref 26.0–34.0)
MCHC: 31.4 g/dL (ref 30.0–36.0)
MCV: 72.6 fL — ABNORMAL LOW (ref 80.0–100.0)
Monocytes Absolute: 0.5 10*3/uL (ref 0.1–1.0)
Monocytes Relative: 8 %
Neutro Abs: 3.3 10*3/uL (ref 1.7–7.7)
Neutrophils Relative %: 49 %
Platelets: 159 10*3/uL (ref 150–400)
RBC: 5.74 MIL/uL (ref 4.22–5.81)
RDW: 17 % — ABNORMAL HIGH (ref 11.5–15.5)
WBC: 6.6 10*3/uL (ref 4.0–10.5)
nRBC: 0 % (ref 0.0–0.2)

## 2021-07-28 LAB — BASIC METABOLIC PANEL
Anion gap: 10 (ref 5–15)
BUN: 12 mg/dL (ref 8–23)
CO2: 23 mmol/L (ref 22–32)
Calcium: 8.9 mg/dL (ref 8.9–10.3)
Chloride: 105 mmol/L (ref 98–111)
Creatinine, Ser: 1.23 mg/dL (ref 0.61–1.24)
GFR, Estimated: >60 mL/min (ref >60)
Glucose, Bld: 85 mg/dL (ref 70–99)
Potassium: 3.7 mmol/L (ref 3.5–5.1)
Sodium: 138 mmol/L (ref 135–145)

## 2021-07-28 MED ORDER — CYCLOBENZAPRINE HCL 10 MG PO TABS: 10.0000 mg | ORAL_TABLET | Freq: Two times a day (BID) | ORAL | 0 refills | Status: DC | PRN

## 2021-07-28 MED ORDER — LIDOCAINE 5 % EX PTCH: 1.0000 | MEDICATED_PATCH | CUTANEOUS | 0 refills | Status: DC

## 2021-07-28 NOTE — ED Provider Notes (Signed)
[facility] EMERGENCY DEPARTMENT Provider Note   CSN: [id number] Arrival date & time: 07/28/21  0908     History Chief Complaint  Patient presents with   Hip Pain    Robert Durham is a 66 y.o. 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 He reports the back pain has been present since Friday and radiates down both the lateral aspects of his thighs bilaterally.  He denies any injury.  He denies any urinary or fecal incontinence, denies any urinary retention, saddle anesthesia.  He denies any recent traumas, falls, or MVC's.  He denies any fevers or chills, denies any history of any IV drug use ever.  He denies any leg swelling, chest pain, shortness of breath, dysuria, hematuria, urinary urgency, urinary frequency, or flank pain.  He reports the pain is worse with movement.  Patient is ambulatory with cane at baseline.   Hip Pain Pertinent negatives include no chest pain, no abdominal pain and no shortness of breath.      Home Medications Prior to Admission medications   Medication Sig Start Date End Date Taking? Authorizing Provider  amoxicillin (AMOXIL) 500 MG capsule Take 1 capsule (500 mg total) by mouth 3 (three) times daily. 11/01/19   Fransico Meadow, PA-C  atorvastatin (LIPITOR) 10 MG tablet Take 10 mg by mouth daily with breakfast.     [provider]  cyclobenzaprine (FLEXERIL) 10 MG tablet Take 1 tablet (10 mg total) by mouth 2 (two) times daily as needed for muscle spasms. 02/09/21   Marcello Fennel, PA-C  cycloSPORINE (SANDIMMUNE) 100 MG capsule Take 100 mg by mouth 2 (two) times daily. Take with 25 mg tablet to equal 125 mg twice daily    [provider]  cycloSPORINE (SANDIMMUNE) 25 MG capsule Take 25 mg by mouth 2 (two) times daily. Take with 100 mg tablet to equal dose of 125 mg twice  daily    [provider]  esomeprazole (NEXIUM) 40 MG capsule Take 40 mg by mouth daily.     [provider]  HYDROcodone-acetaminophen (NORCO) 5-325 MG tablet Take 1 tablet by mouth every 4 (four) hours as needed for moderate pain. 03/08/19 04/17/19  Aviva Signs, MD  predniSONE (DELTASONE) 5 MG tablet Take 5 mg by mouth daily with breakfast.     [provider]  promethazine (PHENERGAN) 25 MG tablet Take 25 mg by mouth every 6 (six) hours as needed for nausea or vomiting.    [provider]  tamsulosin (FLOMAX) 0.4 MG CAPS capsule Take 0.4 mg by mouth daily.     [provider]  vitamin B-12 (CYANOCOBALAMIN) 1000 MCG tablet Take 1,000 mcg by mouth daily.    [provider]  vitamin C (ASCORBIC ACID) 500 MG tablet Take 500 mg by mouth daily. 12/23/14   [provider]      Allergies    Codeine, Enalapril maleate, Nsaids, Tape, Tolmetin, and Vasotec    Review of Systems   Review of Systems  Constitutional:  Negative for chills and fever.  Respiratory:  Negative for shortness of breath.   Cardiovascular:  Negative for chest pain.  Gastrointestinal:  Negative for abdominal pain, constipation, diarrhea, nausea and vomiting.  Genitourinary:  Negative for difficulty urinating, dysuria, flank pain, frequency, hematuria and urgency.  Musculoskeletal:  Positive for back pain. Negative for neck pain.  Neurological:  Negative for weakness and numbness.   Physical Exam Updated Vital Signs BP 105/88 (BP Location: Right Arm)    Pulse 67    Temp 97.6 F (36.4 C) (Oral)    Resp 18    Ht 5\' 11"  (1.803 m)    Wt 86.2 kg    SpO2 100%    BMI 26.50 kg/m  Physical Exam Vitals and nursing note reviewed.  Constitutional:      General: He is not in acute distress.    Appearance: Normal appearance. He is not toxic-appearing.     Comments: Comfortably watching TV on the stretcher.  HENT:     Head: Normocephalic and atraumatic.  Eyes:     General:  No scleral icterus. Cardiovascular:     Rate and Rhythm: Normal rate and regular rhythm.  Pulmonary:     Effort: Pulmonary effort is normal. No respiratory distress.     Breath sounds: Normal breath sounds.  Abdominal:     General: Bowel sounds are normal.     Palpations: Abdomen is soft.     Tenderness: There is no abdominal tenderness. There is no guarding or rebound.  Musculoskeletal:        General: No deformity.     Cervical back: Normal range of motion. No tenderness.     Right lower leg: No edema.     Left lower leg: No edema.     Comments: Patient has tenderness over bilateral paraspinal low lumbar area and into bilateral buttocks, but mainly on the left side.  No overlying skin changes, increased warmth, swelling, or erythema noted to the back.  There is no bony step-off or abnormality noted or palpated.  Patient is able to ambulate for me laying to sitting position with ease.  He has a positive straight leg raise bilaterally.  DP and PT pulses are intact bilaterally.  Compartments are soft.  Sensations intact throughout.  Strength is 5 out of 5 in both the right and left leg.  Skin:    General: Skin is warm and dry.  Neurological:     General: No focal deficit present.     Mental Status: He is alert. Mental status is at baseline.     Sensory: No sensory deficit.     Motor: No weakness.    ED Results / Procedures / Treatments   Labs (all labs ordered are listed, but only abnormal results are displayed) Labs Reviewed  CBC WITH DIFFERENTIAL/PLATELET - Abnormal; Notable for the following components:      Result Value   MCV 72.6 (*)    MCH 22.8 (*)    RDW 17.0 (*)    All other components within normal limits  BASIC METABOLIC PANEL  URINALYSIS, ROUTINE W REFLEX MICROSCOPIC    EKG None  Radiology No results found.  Procedures Procedures   Medications Ordered in ED Medications - No data to display  ED Course/ Medical Decision Making/ A&P                            Medical Decision Making Amount and/or Complexity of Data Reviewed Labs: ordered.   66 year old male presents emerged department for evaluation of low back pain that radiates down the bilateral legs.  Differential diagnosis includes was not limited to fracture, abscess, cauda equina, MSK, sciatica, renal etiology, dissection.  Vital signs are unremarkable.  Patient normotensive, normal pulse rate, afebrile, satting well on room air.  Physical exam is  pertinent for some lower paraspinal lumbar bilateral tenderness to palpation without any palpable muscle spasm.  He has positive straight leg raises with bilateral legs.  Sensations intact throughout and equal.  Compartments are soft.  Palpable DP and PT pulses.  He has full range of motion of his legs and back.  Ambulatory in the emergency department.  I independently reviewed the patient's labs.  CBC unremarkable.  BMP unremarkable.  Urinalysis unremarkable.  Given this, will decision for any renal etiology causing his back pain.  I do not think any imaging is warranted at this time given that he has not had any traumatic injury or fall/MVC.  Patient reports this is his typical flare of his chronic back pain.  I have low solution for any cauda equina as patient does not have any urinary or fecal symptoms.  Does not have any saddle anesthesia.  Low suspicion for any abscess given the patient denies any IV drug use ever or any fevers or chills and is afebrile here.  Low suspicion for any fracture given no injury or mechanism to the back pain.  Low station for any dissection as this is not a typical presentation and the patient has palpable equal pulses bilaterally.  I will send the patient home on Flexeril and lidocaine patches as needed.  Recommended he follow-up with his PCP for possible physical therapy and continuation of his chronic back pain. Recommended heat/ice to the area, whichever provides him the most relief.  Strict return precautions and red  flag symptoms discussed with the patient.  Patient verbalized understanding and agrees to the plan.  Patient is stable and being discharged home in good condition.   Final Clinical Impression(s) / ED Diagnoses Final diagnoses:  Acute bilateral low back pain with bilateral sciatica    Rx / DC Orders ED Discharge Orders          Ordered    cyclobenzaprine (FLEXERIL) 10 MG tablet  2 times daily PRN        07/28/21 1310    lidocaine (LIDODERM) 5 %  Every 24 hours        07/28/21 1310              Sherrell Puller, [place] 07/28/21 1320    Godfrey Pick, MD 07/31/21 1341

## 2021-07-28 NOTE — Discharge Instructions (Addendum)
You are seen in today for evaluation of your chronic back pain.  Your lab work is normal, I do not think this is coming from your kidneys this is likely musculoskeletal in nature.  I prescribed you Flexeril that you have had before for your back pain in addition to some lidocaine patches. If you have any episodes of of urinating or having a bowel movement on yourself, have not been able to urinate in over 12 hours, or have any numbness to your rectum area, fevers, and/or weakness please return the nearest emergency department for reevaluation.  Please schedule an appointment for evaluation with your PCP for your worsening low back pain.

## 2021-07-28 NOTE — ED Triage Notes (Signed)
Bilateral hip and back pain x 5 days

## 2021-08-18 DIAGNOSIS — I10 Essential (primary) hypertension: Secondary | ICD-10-CM | POA: Diagnosis not present

## 2021-08-18 DIAGNOSIS — N183 Chronic kidney disease, stage 3 unspecified: Secondary | ICD-10-CM | POA: Diagnosis not present

## 2021-08-18 DIAGNOSIS — E785 Hyperlipidemia, unspecified: Secondary | ICD-10-CM | POA: Diagnosis not present

## 2021-08-19 DIAGNOSIS — I10 Essential (primary) hypertension: Secondary | ICD-10-CM | POA: Diagnosis not present

## 2021-09-01 DIAGNOSIS — E785 Hyperlipidemia, unspecified: Secondary | ICD-10-CM | POA: Diagnosis not present

## 2021-09-17 DIAGNOSIS — I10 Essential (primary) hypertension: Secondary | ICD-10-CM | POA: Diagnosis not present

## 2021-10-21 DIAGNOSIS — I10 Essential (primary) hypertension: Secondary | ICD-10-CM | POA: Diagnosis not present

## 2021-11-11 DIAGNOSIS — I10 Essential (primary) hypertension: Secondary | ICD-10-CM | POA: Diagnosis not present

## 2021-11-11 DIAGNOSIS — E785 Hyperlipidemia, unspecified: Secondary | ICD-10-CM | POA: Diagnosis not present

## 2021-11-13 DIAGNOSIS — I10 Essential (primary) hypertension: Secondary | ICD-10-CM | POA: Diagnosis not present

## 2021-12-16 DIAGNOSIS — I10 Essential (primary) hypertension: Secondary | ICD-10-CM | POA: Diagnosis not present

## 2021-12-28 ENCOUNTER — Emergency Department (HOSPITAL_COMMUNITY)
Admission: EM | Admit: 2021-12-28 | Discharge: 2021-12-28 | Disposition: A | Discharge status: Home / Self Care | Payer: Medicare Other | Attending: Emergency Medicine | Admitting: Emergency Medicine

## 2021-12-28 ENCOUNTER — Encounter (HOSPITAL_COMMUNITY): Payer: Self-pay | Admitting: Emergency Medicine

## 2021-12-28 ENCOUNTER — Emergency Department (HOSPITAL_COMMUNITY): Payer: Medicare Other

## 2021-12-28 ENCOUNTER — Other Ambulatory Visit: Payer: Self-pay

## 2021-12-28 DIAGNOSIS — M25552 Pain in left hip: Secondary | ICD-10-CM | POA: Insufficient documentation

## 2021-12-28 DIAGNOSIS — Z96642 Presence of left artificial hip joint: Secondary | ICD-10-CM | POA: Insufficient documentation

## 2021-12-28 MED ORDER — OXYCODONE-ACETAMINOPHEN 5-325 MG PO TABS: ORAL_TABLET | ORAL | 0 refills | Status: DC

## 2021-12-28 MED ORDER — HYDROMORPHONE HCL 1 MG/ML IJ SOLN
1.0000 mg | Freq: Once | INTRAMUSCULAR | Status: AC
  Administered 2021-12-28: 1 mg via INTRAMUSCULAR
  Filled 2021-12-28: qty 1

## 2021-12-28 MED ORDER — CELECOXIB 200 MG PO CAPS: ORAL_CAPSULE | ORAL | 1 refills | Status: DC

## 2021-12-28 NOTE — Discharge Instructions (Signed)
Follow-up with Dr. Aline Brochure in the next few weeks for your hip

## 2021-12-28 NOTE — ED Triage Notes (Signed)
Patient reports left hip is hurting more than usual. Left hip was replaced about 6 years ago and patient still has chronic pain. Patient gets pain management from PCP, but not enough to get through the month.

## 2021-12-28 NOTE — ED Provider Notes (Signed)
[facility] EMERGENCY DEPARTMENT Provider Note   CSN: [id number] Arrival date & time: 12/28/21  9233     History  Chief Complaint  Patient presents with   Hip Pain    Left chronic hip pain    Robert Durham is a 66 y.o. male.  Patient has a history of left hip replacement.  He is complaining of pain in that left hip.  The history is provided by the patient and medical records. No language interpreter was used.  Hip Pain This is a recurrent problem. The current episode started 12 to 24 hours ago. The problem occurs constantly. The problem has not changed since onset.Pertinent negatives include no chest pain, no abdominal pain and no headaches. Nothing aggravates the symptoms. Nothing relieves the symptoms. He has tried nothing for the symptoms. The treatment provided no relief.       Home Medications Prior to Admission medications   Medication Sig Start Date End Date Taking? Authorizing Provider  celecoxib (CELEBREX) 200 MG capsule Take 1 twice a day as needed for pain and hip 12/28/21  Yes [name], MD  oxyCODONE-acetaminophen (PERCOCET/ROXICET) 5-325 MG tablet Take 1 every 6 hours as needed for pain not helped by Celebrex alone 12/28/21  Yes [name], MD  amoxicillin (AMOXIL) 500 MG capsule Take 1 capsule (500 mg total) by mouth 3 (three) times daily. 11/01/19   Fransico Meadow, PA-C  atorvastatin (LIPITOR) 10 MG tablet Take 10 mg by mouth daily with breakfast.     [provider]  cyclobenzaprine (FLEXERIL) 10 MG tablet Take 1 tablet (10 mg total) by mouth 2 (two) times daily as needed for muscle spasms. 07/28/21   Sherrell Puller, PA-C  cycloSPORINE (SANDIMMUNE) 100 MG capsule Take 100 mg by mouth 2 (two) times daily. Take with 25 mg tablet to equal 125 mg twice daily    [provider]  cycloSPORINE (SANDIMMUNE) 25 MG capsule Take 25 mg by mouth 2 (two) times daily. Take with 100 mg tablet to equal dose of 125 mg twice daily    [provider]   esomeprazole (NEXIUM) 40 MG capsule Take 40 mg by mouth daily.     [provider]  lidocaine (LIDODERM) 5 % Place 1 patch onto the skin daily. Remove & Discard patch within 12 hours or as directed by MD 07/28/21   Sherrell Puller, PA-C  predniSONE (DELTASONE) 5 MG tablet Take 5 mg by mouth daily with breakfast.     [provider]  promethazine (PHENERGAN) 25 MG tablet Take 25 mg by mouth every 6 (six) hours as needed for nausea or vomiting.    [provider]  tamsulosin (FLOMAX) 0.4 MG CAPS capsule Take 0.4 mg by mouth daily.     [provider]  vitamin B-12 (CYANOCOBALAMIN) 1000 MCG tablet Take 1,000 mcg by mouth daily.    [provider]  vitamin C (ASCORBIC ACID) 500 MG tablet Take 500 mg by mouth daily. 12/23/14   [provider]      Allergies    Codeine, Enalapril maleate, Nsaids, Tape, Tolmetin, and Vasotec    Review of Systems   Review of Systems  Constitutional:  Negative for appetite change and fatigue.  HENT:  Negative for congestion, ear discharge and sinus pressure.   Eyes:  Negative for discharge.  Respiratory:  Negative for cough.   Cardiovascular:  Negative for chest pain.  Gastrointestinal:  Negative for abdominal pain and diarrhea.  Genitourinary:  Negative for frequency and  hematuria.  Musculoskeletal:  Negative for back pain.       Left hip pain  Skin:  Negative for rash.  Neurological:  Negative for seizures and headaches.  Psychiatric/Behavioral:  Negative for hallucinations.     Physical Exam Updated Vital Signs BP 117/84 (BP Location: Left Arm)   Pulse 60   Temp 97.7 F (36.5 C) (Oral)   Resp 17   Ht '5\' 11"'$  (1.803 m)   Wt 81.6 kg   SpO2 100%   BMI 25.10 kg/m  Physical Exam Vitals and nursing note reviewed.  Constitutional:      Appearance: He is well-developed.  HENT:     Head: Normocephalic.     Nose: Nose normal.  Eyes:     General: No scleral icterus.    Conjunctiva/sclera: Conjunctivae  normal.  Neck:     Thyroid: No thyromegaly.  Cardiovascular:     Rate and Rhythm: Normal rate and regular rhythm.     Heart sounds: No murmur heard.    No friction rub. No gallop.  Pulmonary:     Breath sounds: No stridor. No wheezing or rales.  Chest:     Chest wall: No tenderness.  Abdominal:     General: There is no distension.     Tenderness: There is no abdominal tenderness. There is no rebound.  Musculoskeletal:        General: Normal range of motion.     Cervical back: Neck supple.     Comments: Tenderness left hip  Lymphadenopathy:     Cervical: No cervical adenopathy.  Skin:    Findings: No erythema or rash.  Neurological:     Mental Status: He is alert and oriented to person, place, and time.     Motor: No abnormal muscle tone.     Coordination: Coordination normal.  Psychiatric:        Behavior: Behavior normal.     ED Results / Procedures / Treatments   Labs (all labs ordered are listed, but only abnormal results are displayed) Labs Reviewed - No data to display  EKG None  Radiology DG Hip Unilat W or Wo Pelvis 2-3 Views Left  Result Date: 12/28/2021 CLINICAL DATA:  A 65 year old presents with LEFT hip pain for 1 week without history of injury. EXAM: DG HIP (WITH OR WITHOUT PELVIS) 2-3V LEFT COMPARISON:  January 29, 2019. FINDINGS: Signs of LEFT hip arthroplasty. 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 These findings are unchanged. No acute findings relative to the bony pelvis. Soft tissues are unremarkable. 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 No acute finding or change from previous imaging. Electronically Signed   By: Zetta Bills M.D.   On: 12/28/2021 09:07    Procedures Procedures    Medications Ordered in ED Medications  HYDROmorphone (DILAUDID) injection 1 mg (1 mg Intramuscular Given 12/28/21 0849)    ED Course/ Medical Decision Making/ A&P                            Medical Decision Making Amount and/or Complexity of Data Reviewed Radiology: ordered.  Risk Prescription drug management.  This patient presents to the ED for concern of left hip pain, this involves an extensive number of treatment options, and is a complaint that carries with it a high risk of complications and morbidity.  The differential diagnosis includes fracture, arthritis, musculoskeletal injury   Co morbidities  that complicate the patient evaluation  Previous hip replacement   Additional history obtained:  Additional history obtained from patient External records from outside source obtained and reviewed including hospital records   Lab Tests:  None Imaging Studies ordered:  I ordered imaging studies including left hip I independently visualized and interpreted imaging which showed small amount of heterotropic ossification about the left proximal femur I agree with the radiologist interpretation   Cardiac Monitoring: / EKG:  The patient was maintained on a cardiac monitor.  I personally viewed and interpreted the cardiac monitored which showed an underlying rhythm of: Normal sinus rhythm   Consultations Obtained:  No consult  Problem List / ED Course / Critical interventions / Medication management  Hip pain I ordered medication including Dilaudid for pain Reevaluation of the patient after these medicines showed that the patient improved I have reviewed the patients home medicines and have made adjustments as needed   Social Determinants of Health:  None   Test / Admission - Considered:  No additional test considered  Patient with exacerbation of chronic left hip pain.  He is placed on Celebrex and Percocet and will follow-up with orthopedics        Final Clinical Impression(s) / ED Diagnoses Final diagnoses:  Left hip pain    Rx / DC Orders ED Discharge Orders          Ordered    celecoxib (CELEBREX) 200 MG  capsule        12/28/21 1010    oxyCODONE-acetaminophen (PERCOCET/ROXICET) 5-325 MG tablet        12/28/21 1010              [name], MD 12/31/21 1006

## 2022-01-04 DIAGNOSIS — I129 Hypertensive chronic kidney disease with stage 1 through stage 4 chronic kidney disease, or unspecified chronic kidney disease: Secondary | ICD-10-CM | POA: Diagnosis not present

## 2022-01-04 DIAGNOSIS — M25552 Pain in left hip: Secondary | ICD-10-CM | POA: Diagnosis not present

## 2022-01-04 DIAGNOSIS — N183 Chronic kidney disease, stage 3 unspecified: Secondary | ICD-10-CM | POA: Diagnosis not present

## 2022-01-11 DIAGNOSIS — I10 Essential (primary) hypertension: Secondary | ICD-10-CM | POA: Diagnosis not present

## 2022-01-11 DIAGNOSIS — E785 Hyperlipidemia, unspecified: Secondary | ICD-10-CM | POA: Diagnosis not present

## 2022-01-26 DIAGNOSIS — I10 Essential (primary) hypertension: Secondary | ICD-10-CM | POA: Diagnosis not present

## 2022-01-27 ENCOUNTER — Encounter (HOSPITAL_COMMUNITY): Payer: Self-pay | Admitting: Emergency Medicine

## 2022-01-27 ENCOUNTER — Emergency Department (HOSPITAL_COMMUNITY)
Admission: EM | Admit: 2022-01-27 | Discharge: 2022-01-28 | Disposition: A | Discharge status: Home / Self Care | Payer: Medicare Other | Attending: Emergency Medicine | Admitting: Emergency Medicine

## 2022-01-27 ENCOUNTER — Emergency Department (HOSPITAL_COMMUNITY): Payer: Medicare Other

## 2022-01-27 ENCOUNTER — Other Ambulatory Visit: Payer: Self-pay

## 2022-01-27 DIAGNOSIS — Z743 Need for continuous supervision: Secondary | ICD-10-CM | POA: Diagnosis not present

## 2022-01-27 DIAGNOSIS — I700 Atherosclerosis of aorta: Secondary | ICD-10-CM | POA: Diagnosis not present

## 2022-01-27 DIAGNOSIS — R109 Unspecified abdominal pain: Secondary | ICD-10-CM | POA: Diagnosis not present

## 2022-01-27 DIAGNOSIS — K5792 Diverticulitis of intestine, part unspecified, without perforation or abscess without bleeding: Secondary | ICD-10-CM | POA: Diagnosis not present

## 2022-01-27 DIAGNOSIS — R0689 Other abnormalities of breathing: Secondary | ICD-10-CM | POA: Diagnosis not present

## 2022-01-27 LAB — COMPREHENSIVE METABOLIC PANEL
ALT: 21 U/L (ref 0–44)
AST: 26 U/L (ref 15–41)
Albumin: 3.8 g/dL (ref 3.5–5.0)
Alkaline Phosphatase: 54 U/L (ref 38–126)
Anion gap: 5 (ref 5–15)
BUN: 16 mg/dL (ref 8–23)
CO2: 27 mmol/L (ref 22–32)
Calcium: 9 mg/dL (ref 8.9–10.3)
Chloride: 106 mmol/L (ref 98–111)
Creatinine, Ser: 1.45 mg/dL — ABNORMAL HIGH (ref 0.61–1.24)
GFR, Estimated: 53 mL/min — ABNORMAL LOW (ref >60)
Glucose, Bld: 86 mg/dL (ref 70–99)
Potassium: 3.7 mmol/L (ref 3.5–5.1)
Sodium: 138 mmol/L (ref 135–145)
Total Bilirubin: 0.9 mg/dL (ref 0.3–1.2)
Total Protein: 7.6 g/dL (ref 6.5–8.1)

## 2022-01-27 LAB — URINALYSIS, ROUTINE W REFLEX MICROSCOPIC
Bilirubin Urine: NEGATIVE
Glucose, UA: NEGATIVE mg/dL
Hgb urine dipstick: NEGATIVE
Ketones, ur: NEGATIVE mg/dL
Leukocytes,Ua: NEGATIVE
Nitrite: NEGATIVE
Protein, ur: NEGATIVE mg/dL
Specific Gravity, Urine: 1.024 (ref 1.005–1.030)
pH: 6 (ref 5.0–8.0)

## 2022-01-27 LAB — CBC
HCT: 44 % (ref 39.0–52.0)
Hemoglobin: 13.8 g/dL (ref 13.0–17.0)
MCH: 22.3 pg — ABNORMAL LOW (ref 26.0–34.0)
MCHC: 31.4 g/dL (ref 30.0–36.0)
MCV: 71.2 fL — ABNORMAL LOW (ref 80.0–100.0)
Platelets: 164 10*3/uL (ref 150–400)
RBC: 6.18 MIL/uL — ABNORMAL HIGH (ref 4.22–5.81)
RDW: 17.2 % — ABNORMAL HIGH (ref 11.5–15.5)
WBC: 9.3 10*3/uL (ref 4.0–10.5)
nRBC: 0 % (ref 0.0–0.2)

## 2022-01-27 LAB — LIPASE, BLOOD: Lipase: 34 U/L (ref 11–51)

## 2022-01-27 MED ORDER — SODIUM CHLORIDE 0.9 % IV SOLN
1.5000 g | Freq: Once | INTRAVENOUS | Status: AC
  Administered 2022-01-28: 1.5 g via INTRAVENOUS
  Filled 2022-01-27: qty 4

## 2022-01-27 MED ORDER — AMOXICILLIN-POT CLAVULANATE 875-125 MG PO TABS: 1.0000 | ORAL_TABLET | Freq: Two times a day (BID) | ORAL | 0 refills | Status: DC

## 2022-01-27 MED ORDER — IOHEXOL 300 MG/ML  SOLN
100.0000 mL | Freq: Once | INTRAMUSCULAR | Status: AC | PRN
  Administered 2022-01-27: 100 mL via INTRAVENOUS

## 2022-01-27 NOTE — ED Triage Notes (Signed)
Pt c/o lower abd pain that started this am with n/v.

## 2022-01-27 NOTE — ED Provider Notes (Signed)
[facility] EMERGENCY DEPARTMENT Provider Note   CSN: [id number] Arrival date & time: 01/27/22  1901     History  Chief Complaint  Patient presents with   Abdominal Pain    Robert Durham is a 66 y.o. male.  Patient presents to the emergency department for evaluation of lower abdominal pain.  Patient reports that symptoms began earlier today and have been persistent through the day.  He had some nausea and vomiting earlier but this has resolved.  No urinary symptoms.  No fever.  No diarrhea or rectal bleeding.       Home Medications Prior to Admission medications   Medication Sig Start Date End Date Taking? Authorizing Provider  amoxicillin (AMOXIL) 500 MG capsule Take 1 capsule (500 mg total) by mouth 3 (three) times daily. 11/01/19   Fransico Meadow, PA-C  atorvastatin (LIPITOR) 10 MG tablet Take 10 mg by mouth daily with breakfast.     [provider]  celecoxib (CELEBREX) 200 MG capsule Take 1 twice a day as needed for pain and hip 12/28/21   [name], MD  cyclobenzaprine (FLEXERIL) 10 MG tablet Take 1 tablet (10 mg total) by mouth 2 (two) times daily as needed for muscle spasms. 07/28/21   Sherrell Puller, PA-C  cycloSPORINE (SANDIMMUNE) 100 MG capsule Take 100 mg by mouth 2 (two) times daily. Take with 25 mg tablet to equal 125 mg twice daily    [provider]  cycloSPORINE (SANDIMMUNE) 25 MG capsule Take 25 mg by mouth 2 (two) times daily. Take with 100 mg tablet to equal dose of 125 mg twice daily    [provider]  esomeprazole (NEXIUM) 40 MG capsule Take 40 mg by mouth daily.     [provider]  lidocaine (LIDODERM) 5 % Place 1 patch onto the skin daily. Remove & Discard patch within 12 hours or as directed by MD 07/28/21   Sherrell Puller, PA-C  oxyCODONE-acetaminophen (PERCOCET/ROXICET) 5-325 MG tablet Take 1 every 6 hours as needed for pain not helped by Celebrex alone 12/28/21   [name], MD  predniSONE (DELTASONE) 5 MG  tablet Take 5 mg by mouth daily with breakfast.     [provider]  promethazine (PHENERGAN) 25 MG tablet Take 25 mg by mouth every 6 (six) hours as needed for nausea or vomiting.    [provider]  tamsulosin (FLOMAX) 0.4 MG CAPS capsule Take 0.4 mg by mouth daily.     [provider]  vitamin B-12 (CYANOCOBALAMIN) 1000 MCG tablet Take 1,000 mcg by mouth daily.    [provider]  vitamin C (ASCORBIC ACID) 500 MG tablet Take 500 mg by mouth daily. 12/23/14   [provider]      Allergies    Codeine, Enalapril maleate, Nsaids, Tape, Tolmetin, and Vasotec    Review of Systems   Review of Systems  Physical Exam Updated Vital Signs BP 136/89 (BP Location: Right Arm)   Pulse 65   Temp 99 F (37.2 C) (Oral)   Resp 16   Ht '5\' 11"'$  (1.803 m)   Wt 88.5 kg   SpO2 100%   BMI 27.20 kg/m  Physical Exam Vitals and nursing note reviewed.  Constitutional:      General: He is not in acute distress.    Appearance: He is well-developed.  HENT:     Head: Normocephalic and atraumatic.     Mouth/Throat:     Mouth: Mucous membranes are moist.  Eyes:  General: Vision grossly intact. Gaze aligned appropriately.     Extraocular Movements: Extraocular movements intact.     Conjunctiva/sclera: Conjunctivae normal.  Cardiovascular:     Rate and Rhythm: Normal rate and regular rhythm.     Pulses: Normal pulses.     Heart sounds: Normal heart sounds, S1 normal and S2 normal. No murmur heard.    No friction rub. No gallop.  Pulmonary:     Effort: Pulmonary effort is normal. No respiratory distress.     Breath sounds: Normal breath sounds.  Abdominal:     Palpations: Abdomen is soft.     Tenderness: There is abdominal tenderness in the suprapubic area and left lower quadrant. There is no guarding or rebound.     Hernia: No hernia is present.  Musculoskeletal:        General: No swelling.     Cervical back: Full passive range of motion without pain,  normal range of motion and neck supple. No pain with movement, spinous process tenderness or muscular tenderness. Normal range of motion.     Right lower leg: No edema.     Left lower leg: No edema.  Skin:    General: Skin is warm and dry.     Capillary Refill: Capillary refill takes less than 2 seconds.     Findings: No ecchymosis, erythema, lesion or wound.  Neurological:     Mental Status: He is alert and oriented to person, place, and time.     GCS: GCS eye subscore is 4. GCS verbal subscore is 5. GCS motor subscore is 6.     Cranial Nerves: Cranial nerves 2-12 are intact.     Sensory: Sensation is intact.     Motor: Motor function is intact. No weakness or abnormal muscle tone.     Coordination: Coordination is intact.  Psychiatric:        Mood and Affect: Mood normal.        Speech: Speech normal.        Behavior: Behavior normal.     ED Results / Procedures / Treatments   Labs (all labs ordered are listed, but only abnormal results are displayed) Labs Reviewed  COMPREHENSIVE METABOLIC PANEL - Abnormal; Notable for the following components:      Result Value   Creatinine, Ser 1.45 (*)    GFR, Estimated 53 (*)    All other components within normal limits  CBC - Abnormal; Notable for the following components:   RBC 6.18 (*)    MCV 71.2 (*)    MCH 22.3 (*)    RDW 17.2 (*)    All other components within normal limits  LIPASE, BLOOD  URINALYSIS, ROUTINE W REFLEX MICROSCOPIC    EKG None  Radiology CT ABDOMEN PELVIS W CONTRAST  Result Date: 01/27/2022 CLINICAL DATA:  Acute abdominal pain. EXAM: CT ABDOMEN AND PELVIS WITH CONTRAST TECHNIQUE: Multidetector CT imaging of the abdomen and pelvis was performed using the standard protocol following bolus administration of intravenous contrast. RADIATION DOSE REDUCTION: This exam was performed according to the departmental dose-optimization program which includes automated exposure control, adjustment of the mA and/or kV  according to patient size and/or use of iterative reconstruction technique. CONTRAST:  143m OMNIPAQUE IOHEXOL 300 MG/ML  SOLN COMPARISON:  CT abdomen and pelvis 02/01/2019 FINDINGS: Lower chest: No acute abnormality. Hepatobiliary: There are scattered rounded hypodensities throughout the liver measuring up to 15 mm. These are too small to characterize, but most likely cysts. Gallbladder and bile ducts are  within normal limits. Pancreas: Unremarkable. No pancreatic ductal dilatation or surrounding inflammatory changes. Spleen: Normal in size without focal abnormality. Adrenals/Urinary Tract: The bilateral native kidneys are atrophic, unchanged. Transplanted left lower quadrant kidney is present which appears within normal limits. There is normal renal enhancement and excretion in the transplanted kidney. There is no perinephric fluid collection. The bladder and adrenal glands are within normal limits. Stomach/Bowel: There is diffuse colonic diverticulosis. There is focal wall thickening and inflammation of the mid sigmoid colon compatible with acute diverticulitis. There is no evidence for perforation or abscess. There is no bowel obstruction. The appendix, small bowel and stomach are within normal limits. Vascular/Lymphatic: Aortic atherosclerosis. No enlarged abdominal or pelvic lymph nodes. Reproductive: Prostate is unremarkable. Other: No abdominal wall hernia or abnormality. There has been interval right inguinal hernia repair. No abdominopelvic ascites. Musculoskeletal: Left hip arthroplasty is present. No acute fractures are seen. IMPRESSION: 1. Acute uncomplicated sigmoid colon diverticulitis. 2. Stable atrophy of the bilateral native kidneys. Left lower quadrant transplanted kidney appears within normal limits. 3. Hepatic hypodensities are favored as cysts. No follow-up necessary. 4.  Aortic Atherosclerosis (ICD10-I70.0). 5. Electronically Signed   By: Ronney Asters M.D.   On: 01/27/2022 23:09     Procedures Procedures    Medications Ordered in ED Medications  iohexol (OMNIPAQUE) 300 MG/ML solution 100 mL (100 mLs Intravenous Contrast Given 01/27/22 2245)    ED Course/ Medical Decision Making/ A&P                           Medical Decision Making Amount and/or Complexity of Data Reviewed Labs: ordered. Decision-making details documented in ED Course. Radiology: ordered and independent interpretation performed. Decision-making details documented in ED Course.   Patient presents to the emergency department for evaluation of lower abdominal pain for 1 day.  Patient does not have any peritonitis on exam.  Lab work was reassuring.  CT scan performed because of unexplained lower abdominal pain, diverticulitis, colitis, cystitis, ureterolithiasis considered.  CT scan consistent with uncomplicated sigmoid diverticulitis.  Patient will be appropriate for outpatient management with antibiotics, initiated here in the ER.        Final Clinical Impression(s) / ED Diagnoses Final diagnoses:  Diverticulitis    Rx / DC Orders ED Discharge Orders     None         Efrat Zuidema, Gwenyth Allegra, MD 01/27/22 2357

## 2022-01-28 DIAGNOSIS — K5792 Diverticulitis of intestine, part unspecified, without perforation or abscess without bleeding: Secondary | ICD-10-CM | POA: Diagnosis not present

## 2022-02-11 DIAGNOSIS — E785 Hyperlipidemia, unspecified: Secondary | ICD-10-CM | POA: Diagnosis not present

## 2022-02-11 DIAGNOSIS — I10 Essential (primary) hypertension: Secondary | ICD-10-CM | POA: Diagnosis not present

## 2022-02-23 DIAGNOSIS — I10 Essential (primary) hypertension: Secondary | ICD-10-CM | POA: Diagnosis not present

## 2022-03-23 DIAGNOSIS — I10 Essential (primary) hypertension: Secondary | ICD-10-CM | POA: Diagnosis not present

## 2022-04-06 DIAGNOSIS — I129 Hypertensive chronic kidney disease with stage 1 through stage 4 chronic kidney disease, or unspecified chronic kidney disease: Secondary | ICD-10-CM | POA: Diagnosis not present

## 2022-04-06 DIAGNOSIS — N183 Chronic kidney disease, stage 3 unspecified: Secondary | ICD-10-CM | POA: Diagnosis not present

## 2022-04-06 DIAGNOSIS — K2970 Gastritis, unspecified, without bleeding: Secondary | ICD-10-CM | POA: Diagnosis not present

## 2022-04-06 DIAGNOSIS — Z23 Encounter for immunization: Secondary | ICD-10-CM | POA: Diagnosis not present

## 2022-04-06 DIAGNOSIS — I10 Essential (primary) hypertension: Secondary | ICD-10-CM | POA: Diagnosis not present

## 2022-04-23 DIAGNOSIS — I10 Essential (primary) hypertension: Secondary | ICD-10-CM | POA: Diagnosis not present

## 2022-04-27 DIAGNOSIS — Z79621 Long term (current) use of calcineurin inhibitor: Secondary | ICD-10-CM | POA: Diagnosis not present

## 2022-04-27 DIAGNOSIS — T829XXD Unspecified complication of cardiac and vascular prosthetic device, implant and graft, subsequent encounter: Secondary | ICD-10-CM | POA: Diagnosis not present

## 2022-04-27 DIAGNOSIS — Z4822 Encounter for aftercare following kidney transplant: Secondary | ICD-10-CM | POA: Diagnosis not present

## 2022-04-27 DIAGNOSIS — T8610 Unspecified complication of kidney transplant: Secondary | ICD-10-CM | POA: Diagnosis not present

## 2022-04-27 DIAGNOSIS — N183 Chronic kidney disease, stage 3 unspecified: Secondary | ICD-10-CM | POA: Diagnosis not present

## 2022-04-27 DIAGNOSIS — R718 Other abnormality of red blood cells: Secondary | ICD-10-CM | POA: Diagnosis not present

## 2022-04-27 DIAGNOSIS — Z79899 Other long term (current) drug therapy: Secondary | ICD-10-CM | POA: Diagnosis not present

## 2022-04-27 DIAGNOSIS — Z7952 Long term (current) use of systemic steroids: Secondary | ICD-10-CM | POA: Diagnosis not present

## 2022-04-27 DIAGNOSIS — Z940 Kidney transplant status: Secondary | ICD-10-CM | POA: Diagnosis not present

## 2022-04-27 DIAGNOSIS — Z5181 Encounter for therapeutic drug level monitoring: Secondary | ICD-10-CM | POA: Diagnosis not present

## 2022-05-07 ENCOUNTER — Emergency Department (HOSPITAL_COMMUNITY): Payer: Medicare Other

## 2022-05-07 ENCOUNTER — Encounter (HOSPITAL_COMMUNITY): Payer: Self-pay | Admitting: *Deleted

## 2022-05-07 ENCOUNTER — Emergency Department (HOSPITAL_COMMUNITY)
Admission: EM | Admit: 2022-05-07 | Discharge: 2022-05-07 | Disposition: A | Discharge status: Home / Self Care | Payer: Medicare Other | Attending: Emergency Medicine | Admitting: Emergency Medicine

## 2022-05-07 ENCOUNTER — Other Ambulatory Visit: Payer: Self-pay

## 2022-05-07 DIAGNOSIS — K7689 Other specified diseases of liver: Secondary | ICD-10-CM | POA: Diagnosis not present

## 2022-05-07 DIAGNOSIS — R112 Nausea with vomiting, unspecified: Secondary | ICD-10-CM | POA: Diagnosis present

## 2022-05-07 DIAGNOSIS — K5732 Diverticulitis of large intestine without perforation or abscess without bleeding: Secondary | ICD-10-CM | POA: Diagnosis not present

## 2022-05-07 DIAGNOSIS — E119 Type 2 diabetes mellitus without complications: Secondary | ICD-10-CM | POA: Insufficient documentation

## 2022-05-07 DIAGNOSIS — Z940 Kidney transplant status: Secondary | ICD-10-CM | POA: Diagnosis not present

## 2022-05-07 DIAGNOSIS — K5792 Diverticulitis of intestine, part unspecified, without perforation or abscess without bleeding: Secondary | ICD-10-CM | POA: Diagnosis not present

## 2022-05-07 DIAGNOSIS — K5730 Diverticulosis of large intestine without perforation or abscess without bleeding: Secondary | ICD-10-CM | POA: Diagnosis not present

## 2022-05-07 DIAGNOSIS — R0602 Shortness of breath: Secondary | ICD-10-CM | POA: Diagnosis not present

## 2022-05-07 LAB — COMPREHENSIVE METABOLIC PANEL
ALT: 16 U/L (ref 0–44)
AST: 24 U/L (ref 15–41)
Albumin: 3.8 g/dL (ref 3.5–5.0)
Alkaline Phosphatase: 51 U/L (ref 38–126)
Anion gap: 8 (ref 5–15)
BUN: 14 mg/dL (ref 8–23)
CO2: 25 mmol/L (ref 22–32)
Calcium: 9.2 mg/dL (ref 8.9–10.3)
Chloride: 105 mmol/L (ref 98–111)
Creatinine, Ser: 1.26 mg/dL — ABNORMAL HIGH (ref 0.61–1.24)
GFR, Estimated: >60 mL/min (ref >60)
Glucose, Bld: 95 mg/dL (ref 70–99)
Potassium: 3.6 mmol/L (ref 3.5–5.1)
Sodium: 138 mmol/L (ref 135–145)
Total Bilirubin: 0.9 mg/dL (ref 0.3–1.2)
Total Protein: 7.6 g/dL (ref 6.5–8.1)

## 2022-05-07 LAB — CBC WITH DIFFERENTIAL/PLATELET
Abs Immature Granulocytes: 0.06 10*3/uL (ref 0.00–0.07)
Basophils Absolute: 0.1 10*3/uL (ref 0.0–0.1)
Basophils Relative: 1 %
Eosinophils Absolute: 0.2 10*3/uL (ref 0.0–0.5)
Eosinophils Relative: 3 %
HCT: 43.2 % (ref 39.0–52.0)
Hemoglobin: 13.7 g/dL (ref 13.0–17.0)
Immature Granulocytes: 1 %
Lymphocytes Relative: 30 %
Lymphs Abs: 2 10*3/uL (ref 0.7–4.0)
MCH: 22.6 pg — ABNORMAL LOW (ref 26.0–34.0)
MCHC: 31.7 g/dL (ref 30.0–36.0)
MCV: 71.2 fL — ABNORMAL LOW (ref 80.0–100.0)
Monocytes Absolute: 0.8 10*3/uL (ref 0.1–1.0)
Monocytes Relative: 11 %
Neutro Abs: 3.6 10*3/uL (ref 1.7–7.7)
Neutrophils Relative %: 54 %
Platelets: 192 10*3/uL (ref 150–400)
RBC: 6.07 MIL/uL — ABNORMAL HIGH (ref 4.22–5.81)
RDW: 16.1 % — ABNORMAL HIGH (ref 11.5–15.5)
WBC: 6.7 10*3/uL (ref 4.0–10.5)
nRBC: 0 % (ref 0.0–0.2)

## 2022-05-07 LAB — URINALYSIS, ROUTINE W REFLEX MICROSCOPIC
Bilirubin Urine: NEGATIVE
Glucose, UA: NEGATIVE mg/dL
Hgb urine dipstick: NEGATIVE
Ketones, ur: NEGATIVE mg/dL
Leukocytes,Ua: NEGATIVE
Nitrite: NEGATIVE
Protein, ur: NEGATIVE mg/dL
Specific Gravity, Urine: 1.018 (ref 1.005–1.030)
pH: 6 (ref 5.0–8.0)

## 2022-05-07 LAB — LIPASE, BLOOD: Lipase: 38 U/L (ref 11–51)

## 2022-05-07 MED ORDER — ONDANSETRON HCL 4 MG/2ML IJ SOLN
4.0000 mg | Freq: Once | INTRAMUSCULAR | Status: AC
  Administered 2022-05-07: 4 mg via INTRAVENOUS
  Filled 2022-05-07: qty 2

## 2022-05-07 MED ORDER — OXYCODONE-ACETAMINOPHEN 5-325 MG PO TABS: 1.0000 | ORAL_TABLET | Freq: Four times a day (QID) | ORAL | 0 refills | Status: DC | PRN

## 2022-05-07 MED ORDER — MORPHINE SULFATE (PF) 4 MG/ML IV SOLN
4.0000 mg | Freq: Once | INTRAVENOUS | Status: AC
  Administered 2022-05-07: 4 mg via INTRAVENOUS
  Filled 2022-05-07: qty 1

## 2022-05-07 MED ORDER — HYDROCODONE-ACETAMINOPHEN 5-325 MG PO TABS: 1.0000 | ORAL_TABLET | Freq: Four times a day (QID) | ORAL | 0 refills | Status: DC | PRN

## 2022-05-07 MED ORDER — ONDANSETRON HCL 4 MG PO TABS: 4.0000 mg | ORAL_TABLET | Freq: Four times a day (QID) | ORAL | 0 refills | Status: DC

## 2022-05-07 MED ORDER — AMOXICILLIN-POT CLAVULANATE 875-125 MG PO TABS
1.0000 | ORAL_TABLET | Freq: Once | ORAL | Status: AC
  Administered 2022-05-07: 1 via ORAL
  Filled 2022-05-07: qty 1

## 2022-05-07 MED ORDER — TAMSULOSIN HCL 0.4 MG PO CAPS: 0.4000 mg | ORAL_CAPSULE | Freq: Every day | ORAL | 0 refills | Status: DC

## 2022-05-07 MED ORDER — AMOXICILLIN-POT CLAVULANATE 875-125 MG PO TABS: 1.0000 | ORAL_TABLET | Freq: Two times a day (BID) | ORAL | 0 refills | Status: DC

## 2022-05-07 MED ORDER — ONDANSETRON 4 MG PO TBDP: ORAL_TABLET | ORAL | 0 refills | Status: DC

## 2022-05-07 NOTE — ED Triage Notes (Signed)
Pt c/o left flank pain x 2 days with loss of appetite

## 2022-05-07 NOTE — ED Notes (Signed)
Provided pt with water and crackers for PO challenge.

## 2022-05-07 NOTE — Discharge Instructions (Addendum)
Make sure you are drinking plenty of fluids.  I encourage you to maintain a clear liquid diet to a very bland diet, foods such as soup, broth, Jell-O, applesauce, foods that are easy to digest as this infection hopefully clears.  Take the entire course of the antibiotics prescribed you have been also given medication to help you with pain and nausea if the symptoms persist.  Do not drive within 4 hours of taking hydrocodone as this will make you drowsy.  It is very important that you return here if you develop uncontrolled vomiting, fever or worse pain as these are signs that your infection may not be improving with this antibiotic.  Plan otherwise to see your doctor for recheck of your symptoms in 1 week if you continue to have any symptoms.

## 2022-05-07 NOTE — ED Provider Notes (Signed)
[facility] EMERGENCY DEPARTMENT Provider Note   CSN: [id number] Arrival date & time: 05/07/22  5638     History  Chief Complaint  Patient presents with   Flank Pain    Robert Durham is a 66 y.o. male With a history including diet-controlled diabetes, history of diverticulitis, chronic back pain, history of kidney transplant on immunosuppressants and has been compliant presenting with left flank pain in association with nausea and vomiting.  He has been unable to tolerate any p.o. intake for the past 2 days.  He reports poor appetite but when he tries to eat or drink it causes significant nausea.  He has had no constipation, denies any urinary complaints, no obvious fevers but does endorse hot flushes in chills.  He has had no treatment for symptoms prior to arrival but is concerned about his kidney function.  Additionally he denies chest pain, palpitations, URI type symptoms including no cough.  He does endorse occasional shortness of breath with exertion but states this is a chronic symptom and not new or worse today.     The history is provided by the patient.       Home Medications Prior to Admission medications   Medication Sig Start Date End Date Taking? Authorizing Provider  amoxicillin-clavulanate (AUGMENTIN) 875-125 MG tablet Take 1 tablet by mouth every 12 (twelve) hours. 05/07/22  Yes Kyndal Heringer, Almyra Free, PA-C  atorvastatin (LIPITOR) 10 MG tablet Take 10 mg by mouth daily with breakfast.    Yes [provider]  cycloSPORINE (SANDIMMUNE) 100 MG capsule Take 100 mg by mouth 2 (two) times daily. Take with 25 mg tablet to equal 125 mg twice daily   Yes [provider]  cycloSPORINE (SANDIMMUNE) 25 MG capsule Take 25 mg by mouth 2 (two) times daily. Take with 100 mg tablet to equal dose of 125 mg twice daily   Yes [provider]  HYDROcodone-acetaminophen (NORCO/VICODIN) 5-325 MG tablet Take 1 tablet by mouth every 6 (six) hours as needed. 05/07/22  Yes  Dharma Pare, Almyra Free, PA-C  ondansetron (ZOFRAN) 4 MG tablet Take 1 tablet (4 mg total) by mouth every 6 (six) hours. 05/07/22  Yes Atha Mcbain, Almyra Free, PA-C  predniSONE (DELTASONE) 5 MG tablet Take 5 mg by mouth daily with breakfast.    Yes [provider]  promethazine (PHENERGAN) 25 MG tablet Take 25 mg by mouth every 6 (six) hours as needed for nausea or vomiting.   Yes [provider]  vitamin B-12 (CYANOCOBALAMIN) 1000 MCG tablet Take 1,000 mcg by mouth daily.   Yes [provider]  vitamin C (ASCORBIC ACID) 500 MG tablet Take 500 mg by mouth daily. 12/23/14  Yes [provider]  celecoxib (CELEBREX) 200 MG capsule Take 1 twice a day as needed for pain and hip Patient not taking: Reported on 05/07/2022 12/28/21   [name], MD  cyclobenzaprine (FLEXERIL) 10 MG tablet Take 1 tablet (10 mg total) by mouth 2 (two) times daily as needed for muscle spasms. Patient not taking: Reported on 05/07/2022 07/28/21   Sherrell Puller, PA-C  lidocaine (LIDODERM) 5 % Place 1 patch onto the skin daily. Remove & Discard patch within 12 hours or as directed by MD Patient not taking: Reported on 05/07/2022 07/28/21   Sherrell Puller, PA-C      Allergies    Codeine, Enalapril maleate, Nsaids, Tape, Tolmetin, and Vasotec    Review of Systems   Review of Systems  Constitutional:  Positive for chills.  HENT:  Negative for congestion.  Eyes: Negative.   Respiratory:  Negative for chest tightness and shortness of breath.   Cardiovascular:  Negative for chest pain and palpitations.  Gastrointestinal:  Positive for abdominal pain, nausea and vomiting. Negative for constipation and diarrhea.  Genitourinary:  Positive for flank pain. Negative for dysuria.  Musculoskeletal:  Negative for arthralgias, joint swelling and neck pain.  Skin: Negative.  Negative for rash and wound.  Neurological:  Negative for dizziness, weakness, light-headedness, numbness and headaches.  Psychiatric/Behavioral:  Negative.    All other systems reviewed and are negative.   Physical Exam Updated Vital Signs BP 119/82   Pulse 73   Temp 98.4 F (36.9 C) (Oral)   Resp 18   Ht _0  (1.803 m)   Wt 95.7 kg   SpO2 98%   BMI 29.43 kg/m  Physical Exam Vitals and nursing note reviewed.  Constitutional:      Appearance: He is well-developed.  HENT:     Head: Normocephalic and atraumatic.  Eyes:     Conjunctiva/sclera: Conjunctivae normal.  Cardiovascular:     Rate and Rhythm: Normal rate and regular rhythm.     Heart sounds: Normal heart sounds.  Pulmonary:     Effort: Pulmonary effort is normal.     Breath sounds: Normal breath sounds. No wheezing.  Abdominal:     General: Bowel sounds are normal.     Palpations: Abdomen is soft.     Tenderness: There is no abdominal tenderness.     Hernia: No hernia is present.       Comments: Patient has pain at his left lateral abdomen, not reproducible on exam.  His abdomen is soft, no guarding, no distention.  He has a well-healed surgical incision in his left lower abdomen.  Musculoskeletal:        General: Normal range of motion.     Cervical back: Normal range of motion.  Skin:    General: Skin is warm and dry.  Neurological:     Mental Status: He is alert.     ED Results / Procedures / Treatments   Labs (all labs ordered are listed, but only abnormal results are displayed) Labs Reviewed  CBC WITH DIFFERENTIAL/PLATELET - Abnormal; Notable for the following components:      Result Value   RBC 6.07 (*)    MCV 71.2 (*)    MCH 22.6 (*)    RDW 16.1 (*)    All other components within normal limits  COMPREHENSIVE METABOLIC PANEL - Abnormal; Notable for the following components:   Creatinine, Ser 1.26 (*)    All other components within normal limits  URINALYSIS, ROUTINE W REFLEX MICROSCOPIC - Abnormal; Notable for the following components:   APPearance HAZY (*)    All other components within normal limits  LIPASE, BLOOD     EKG None  Radiology CT ABDOMEN PELVIS WO CONTRAST  Result Date: 05/07/2022 CLINICAL DATA:  66 year old male with LEFT abdominal and pelvic pain for 2 days. History of renal transplant. EXAM: CT ABDOMEN AND PELVIS WITHOUT CONTRAST TECHNIQUE: Multidetector CT imaging of the abdomen and pelvis was performed following the standard protocol without IV contrast. RADIATION DOSE REDUCTION: This exam was performed according to the departmental dose-optimization program which includes automated exposure control, adjustment of the mA and/or kV according to patient size and/or use of iterative reconstruction technique. COMPARISON:  01/27/2022 CT and prior studies FINDINGS: Please note that parenchymal and vascular abnormalities may be missed as intravenous contrast was not administered. Lower chest: No  acute abnormality. Hepatobiliary: The liver and gallbladder are unremarkable except for unchanged hepatic cysts. There is no evidence of intrahepatic or extrahepatic biliary dilatation. Pancreas: Unremarkable Spleen: Unremarkable Adrenals/Urinary Tract: A LEFT pelvic transplant kidney is noted without hydronephrosis or perinephric inflammation. No urinary calculi identified. Atrophic native kidneys are present. The adrenal glands and bladder are within normal limits. Stomach/Bowel: There is very mild stranding/inflammation along a diverticulum of the sigmoid colon likely representing mild acute diverticulitis (images 49-50: Series 2). There is no evidence of bowel obstruction, other bowel wall thickening or other inflammatory changes. The appendix is normal. There is no evidence of focal collection/abscess or pneumoperitoneum. Extensive colonic diverticulosis noted. Vascular/Lymphatic: Aortic atherosclerosis. No enlarged abdominal or pelvic lymph nodes. Reproductive: Prostate is unremarkable. Other: No ascites, focal collection or pneumoperitoneum. Musculoskeletal: No acute or suspicious bony abnormalities are noted.  Degenerative changes within the lumbar spine and LEFT total hip arthroplasty again identified. IMPRESSION: 1. Very mild stranding/inflammation along a diverticulum of the sigmoid colon, likely representing mild acute diverticulitis. No evidence of focal collection/abscess or pneumoperitoneum. 2. LEFT pelvic transplant kidney without hydronephrosis or perinephric inflammation. 3.  Aortic Atherosclerosis (ICD10-I70.0). Electronically Signed   By: Margarette Canada M.D.   On: 05/07/2022 14:32    Procedures Procedures    Medications Ordered in ED Medications  morphine (PF) 4 MG/ML injection 4 mg (4 mg Intravenous Given 05/07/22 1203)  ondansetron (ZOFRAN) injection 4 mg (4 mg Intravenous Given 05/07/22 1202)  amoxicillin-clavulanate (AUGMENTIN) 875-125 MG per tablet 1 tablet (1 tablet Oral Given 05/07/22 1627)    ED Course/ Medical Decision Making/ A&P                           Medical Decision Making Patient presenting with left lower abdomen/left lateral flank pain, he is mostly concerned about his kidney as he has a kidney transplant in that left lower abdomen.  He has had no fevers, no nausea or vomiting although he has had poor p.o. intake due to generalized lack of appetite since symptoms began.  Differential diagnosis including kidney complication, although not sure I would expect pain specifically for acute kidney injury.  Additionally would be concerned about intra-abdominal process such as SBO, kidney stone although he does not have this history, diverticulitis, musculoskeletal pain, pyelonephritis.  He denies dysuria.    Amount and/or Complexity of Data Reviewed Labs: ordered.    Details: His labs are reassuring with a normal WBC count of 6.7.  C-Met is essentially normal, he does have a creatinine of 1.26 and a upon review of chart this is a stable creatinine for him.  He has a normal BUN.  He also has a normal potassium at 3.6.  His urine is negative for infection. Radiology: ordered.     Details: CT noncontrast study completed and is revealing for a mild left lower quadrant diverticulitis without abscess or perforation.  Discussed [facility]  Risk Prescription drug management. Decision regarding hospitalization. Risk Details: This is an treatment plan with patient.  He has been able to tolerate p.o. fluids and a snack here.  He is sent home with Augmentin, also prescribe Zofran and hydrocodone for symptom relief.  We discussed home care including a mostly liquid diet but can eat bland foods such as soup, applesauce, advancing diet after 48 hours if his symptoms are significantly improving.  He was also given strict return precautions if he develops fever, worse pain or vomiting.  Patient understands and agrees with  treatment plan.           Final Clinical Impression(s) / ED Diagnoses Final diagnoses:  Diverticulitis    Rx / DC Orders      Landis Martins 05/07/22 1707    [name], MD 05/08/22 [phone]

## 2022-05-08 ENCOUNTER — Encounter (HOSPITAL_COMMUNITY): Payer: Self-pay

## 2022-05-08 ENCOUNTER — Emergency Department (HOSPITAL_COMMUNITY)
Admission: EM | Admit: 2022-05-08 | Discharge: 2022-05-08 | Disposition: A | Discharge status: Home / Self Care | Payer: Medicare Other | Attending: Emergency Medicine | Admitting: Emergency Medicine

## 2022-05-08 ENCOUNTER — Other Ambulatory Visit: Payer: Self-pay

## 2022-05-08 DIAGNOSIS — R109 Unspecified abdominal pain: Secondary | ICD-10-CM | POA: Diagnosis present

## 2022-05-08 DIAGNOSIS — R110 Nausea: Secondary | ICD-10-CM | POA: Insufficient documentation

## 2022-05-08 DIAGNOSIS — R1032 Left lower quadrant pain: Secondary | ICD-10-CM | POA: Diagnosis not present

## 2022-05-08 DIAGNOSIS — E119 Type 2 diabetes mellitus without complications: Secondary | ICD-10-CM | POA: Insufficient documentation

## 2022-05-08 DIAGNOSIS — I10 Essential (primary) hypertension: Secondary | ICD-10-CM | POA: Insufficient documentation

## 2022-05-08 MED ORDER — OXYCODONE-ACETAMINOPHEN 5-325 MG PO TABS
1.0000 | ORAL_TABLET | Freq: Once | ORAL | Status: AC
  Administered 2022-05-08: 1 via ORAL
  Filled 2022-05-08: qty 1

## 2022-05-08 MED ORDER — ONDANSETRON 4 MG PO TBDP
4.0000 mg | ORAL_TABLET | Freq: Once | ORAL | Status: AC
  Administered 2022-05-08: 4 mg via ORAL
  Filled 2022-05-08: qty 1

## 2022-05-08 NOTE — ED Triage Notes (Signed)
Patient c/o lower abd pain and left flank pain x4 days. Patient seen here in ED last night and diagnosed with diverticulitis. Patient given antibiotics. And hydrocodone. Patient last took hydrocodone last night with no relief. Per patient tried to eat crackers and made pain and nausea worse. Denies any vomiting, diarrhea, or fevers.

## 2022-05-08 NOTE — ED Provider Notes (Signed)
[facility] EMERGENCY DEPARTMENT Provider Note   CSN: [id number] Arrival date & time: 05/08/22  1024     History {Add pertinent medical, surgical, social history, OB history to HPI:1} Chief Complaint  Patient presents with   Abdominal Pain    Robert Durham is a 66 y.o. male.   Abdominal Pain Associated symptoms: nausea   Patient presents for left-sided abdominal pain.  Medical history includes chronic pain, depression, DM, gastritis, HTN, renal transplant.  He was seen in the ED yesterday for similar symptoms.  CT scan showed diverticulitis.  He was prescribed Augmentin, Norco, and Zofran.  He has not taken any medications today.  He has had continued pain and nausea.  For this reason he presents to the ED.  Patient describes gagging episode but no vomiting.  He denies fevers or chills.  He has not had any bowel movements in the past 2 days which she attributes to lack of p.o. intake.     Home Medications Prior to Admission medications   Medication Sig Start Date End Date Taking? Authorizing Provider  amoxicillin-clavulanate (AUGMENTIN) 875-125 MG tablet Take 1 tablet by mouth every 12 (twelve) hours. 05/07/22   [name], PA-C  atorvastatin (LIPITOR) 10 MG tablet Take 10 mg by mouth daily with breakfast.     [provider]  celecoxib (CELEBREX) 200 MG capsule Take 1 twice a day as needed for pain and hip Patient not taking: Reported on 05/07/2022 12/28/21   [name], MD  cyclobenzaprine (FLEXERIL) 10 MG tablet Take 1 tablet (10 mg total) by mouth 2 (two) times daily as needed for muscle spasms. Patient not taking: Reported on 05/07/2022 07/28/21   Sherrell Puller, PA-C  cycloSPORINE (SANDIMMUNE) 100 MG capsule Take 100 mg by mouth 2 (two) times daily. Take with 25 mg tablet to equal 125 mg twice daily    [provider]  cycloSPORINE (SANDIMMUNE) 25 MG capsule Take 25 mg by mouth 2 (two) times daily. Take with 100 mg tablet to equal dose of 125 mg twice  daily    [provider]  HYDROcodone-acetaminophen (NORCO/VICODIN) 5-325 MG tablet Take 1 tablet by mouth every 6 (six) hours as needed. 05/07/22   [name], PA-C  lidocaine (LIDODERM) 5 % Place 1 patch onto the skin daily. Remove & Discard patch within 12 hours or as directed by MD Patient not taking: Reported on 05/07/2022 07/28/21   Sherrell Puller, PA-C  ondansetron (ZOFRAN) 4 MG tablet Take 1 tablet (4 mg total) by mouth every 6 (six) hours. 05/07/22   Idol, Almyra Free, PA-C  predniSONE (DELTASONE) 5 MG tablet Take 5 mg by mouth daily with breakfast.     [provider]  promethazine (PHENERGAN) 25 MG tablet Take 25 mg by mouth every 6 (six) hours as needed for nausea or vomiting.    [provider]  vitamin B-12 (CYANOCOBALAMIN) 1000 MCG tablet Take 1,000 mcg by mouth daily.    [provider]  vitamin C (ASCORBIC ACID) 500 MG tablet Take 500 mg by mouth daily. 12/23/14   [provider]      Allergies    Codeine, Enalapril maleate, Nsaids, Tape, Tolmetin, and Vasotec    Review of Systems   Review of Systems  Constitutional:  Positive for appetite change.  Gastrointestinal:  Positive for abdominal pain and nausea.  Genitourinary:  Positive for flank pain.  All other systems reviewed and are negative.   Physical Exam Updated Vital Signs BP 114/78 (BP Location: Left Arm)  Pulse 86   Temp 98.5 F (36.9 C) (Oral)   Resp 18   Ht '5\' 11"'$  (1.803 m)   Wt 90.7 kg   SpO2 100%   BMI 27.89 kg/m  Physical Exam Vitals and nursing note reviewed.  Constitutional:      General: He is not in acute distress.    Appearance: He is well-developed. He is not ill-appearing, toxic-appearing or diaphoretic.  HENT:     Head: Normocephalic and atraumatic.     Mouth/Throat:     Mouth: Mucous membranes are moist.     Pharynx: Oropharynx is clear.  Eyes:     Extraocular Movements: Extraocular movements intact.     Conjunctiva/sclera: Conjunctivae normal.   Cardiovascular:     Rate and Rhythm: Normal rate and regular rhythm.     Heart sounds: No murmur heard. Pulmonary:     Effort: Pulmonary effort is normal. No respiratory distress.  Abdominal:     Palpations: Abdomen is soft.     Tenderness: There is abdominal tenderness in the left lower quadrant. There is no guarding or rebound.  Musculoskeletal:        General: No swelling.     Cervical back: Neck supple.  Skin:    General: Skin is warm and dry.     Coloration: Skin is not cyanotic, jaundiced or pale.  Neurological:     General: No focal deficit present.     Mental Status: He is alert and oriented to person, place, and time.  Psychiatric:        Mood and Affect: Mood normal.        Behavior: Behavior normal.     ED Results / Procedures / Treatments   Labs (all labs ordered are listed, but only abnormal results are displayed) Labs Reviewed - No data to display  EKG None  Radiology CT ABDOMEN PELVIS WO CONTRAST  Result Date: 05/07/2022 CLINICAL DATA:  66 year old male with LEFT abdominal and pelvic pain for 2 days. History of renal transplant. EXAM: CT ABDOMEN AND PELVIS WITHOUT CONTRAST TECHNIQUE: Multidetector CT imaging of the abdomen and pelvis was performed following the standard protocol without IV contrast. RADIATION DOSE REDUCTION: This exam was performed according to the departmental dose-optimization program which includes automated exposure control, adjustment of the mA and/or kV according to patient size and/or use of iterative reconstruction technique. COMPARISON:  01/27/2022 CT and prior studies FINDINGS: Please note that parenchymal and vascular abnormalities may be missed as intravenous contrast was not administered. Lower chest: No acute abnormality. Hepatobiliary: The liver and gallbladder are unremarkable except for unchanged hepatic cysts. There is no evidence of intrahepatic or extrahepatic biliary dilatation. Pancreas: Unremarkable Spleen: Unremarkable  Adrenals/Urinary Tract: A LEFT pelvic transplant kidney is noted without hydronephrosis or perinephric inflammation. No urinary calculi identified. Atrophic native kidneys are present. The adrenal glands and bladder are within normal limits. Stomach/Bowel: There is very mild stranding/inflammation along a diverticulum of the sigmoid colon likely representing mild acute diverticulitis (images 49-50: Series 2). There is no evidence of bowel obstruction, other bowel wall thickening or other inflammatory changes. The appendix is normal. There is no evidence of focal collection/abscess or pneumoperitoneum. Extensive colonic diverticulosis noted. Vascular/Lymphatic: Aortic atherosclerosis. No enlarged abdominal or pelvic lymph nodes. Reproductive: Prostate is unremarkable. Other: No ascites, focal collection or pneumoperitoneum. Musculoskeletal: No acute or suspicious bony abnormalities are noted. Degenerative changes within the lumbar spine and LEFT total hip arthroplasty again identified. IMPRESSION: 1. Very mild stranding/inflammation along a diverticulum of the sigmoid  colon, likely representing mild acute diverticulitis. No evidence of focal collection/abscess or pneumoperitoneum. 2. LEFT pelvic transplant kidney without hydronephrosis or perinephric inflammation. 3.  Aortic Atherosclerosis (ICD10-I70.0). Electronically Signed   By: Margarette Canada M.D.   On: 05/07/2022 14:32    Procedures Procedures  {Document cardiac monitor, telemetry assessment procedure when appropriate:1}  Medications Ordered in ED Medications - No data to display  ED Course/ Medical Decision Making/ A&P                           Medical Decision Making Risk Prescription drug management.   Patient presents for persistent left sided abdominal pain and flank pain.  He did undergo ED work-up yesterday and was diagnosed with diverticulitis.  Patient reports that he has not taken any of his medications today.  He has had persistent pain  and nausea.  On arrival in the ED today, vital signs are normal.  Patient is well-appearing.  Endorses left-sided abdominal pain and some mild tenderness is present without guarding or rebound.  I suspect that patient's symptoms are persistent due to not taking any medications at home.  Patient to receive Percocet and Zofran in the ED.  On reassessment,***.  P.o. challenge,***.  {Document critical care time when appropriate:1} {Document review of labs and clinical decision tools ie heart score, Chads2Vasc2 etc:1}  {Document your independent review of radiology images, and any outside records:1} {Document your discussion with family members, caretakers, and with consultants:1} {Document social determinants of health affecting pt's care:1} {Document your decision making why or why not admission, treatments were needed:1} Final Clinical Impression(s) / ED Diagnoses Final diagnoses:  None    Rx / DC Orders ED Discharge Orders     None

## 2022-05-08 NOTE — Discharge Instructions (Signed)
Take your Norco at home as needed for pain.  Take Zofran as needed for nausea.  Take in clear liquids at first and advance your diet slowly.  Your symptoms should improve over the next several days.  Return to the emergency department for any worsening.

## 2022-05-21 DIAGNOSIS — I10 Essential (primary) hypertension: Secondary | ICD-10-CM | POA: Diagnosis not present

## 2022-06-21 DIAGNOSIS — I10 Essential (primary) hypertension: Secondary | ICD-10-CM | POA: Diagnosis not present

## 2022-07-13 DIAGNOSIS — I10 Essential (primary) hypertension: Secondary | ICD-10-CM | POA: Diagnosis not present

## 2022-07-13 DIAGNOSIS — E785 Hyperlipidemia, unspecified: Secondary | ICD-10-CM | POA: Diagnosis not present

## 2022-07-20 DIAGNOSIS — I10 Essential (primary) hypertension: Secondary | ICD-10-CM | POA: Diagnosis not present

## 2022-07-20 DIAGNOSIS — M25551 Pain in right hip: Secondary | ICD-10-CM | POA: Diagnosis not present

## 2022-07-20 DIAGNOSIS — E785 Hyperlipidemia, unspecified: Secondary | ICD-10-CM | POA: Diagnosis not present

## 2022-07-20 DIAGNOSIS — M25552 Pain in left hip: Secondary | ICD-10-CM | POA: Diagnosis not present

## 2022-07-20 DIAGNOSIS — M182 Bilateral post-traumatic osteoarthritis of first carpometacarpal joints: Secondary | ICD-10-CM | POA: Diagnosis not present

## 2022-07-28 DIAGNOSIS — I10 Essential (primary) hypertension: Secondary | ICD-10-CM | POA: Diagnosis not present

## 2022-08-17 ENCOUNTER — Encounter (HOSPITAL_COMMUNITY): Payer: Self-pay

## 2022-08-17 ENCOUNTER — Other Ambulatory Visit: Payer: Self-pay

## 2022-08-17 ENCOUNTER — Emergency Department (HOSPITAL_COMMUNITY)
Admission: EM | Admit: 2022-08-17 | Discharge: 2022-08-17 | Disposition: A | Discharge status: Home / Self Care | Payer: 59 | Attending: Emergency Medicine | Admitting: Emergency Medicine

## 2022-08-17 DIAGNOSIS — M546 Pain in thoracic spine: Secondary | ICD-10-CM

## 2022-08-17 DIAGNOSIS — Z940 Kidney transplant status: Secondary | ICD-10-CM | POA: Insufficient documentation

## 2022-08-17 DIAGNOSIS — M545 Low back pain, unspecified: Secondary | ICD-10-CM | POA: Insufficient documentation

## 2022-08-17 DIAGNOSIS — M542 Cervicalgia: Secondary | ICD-10-CM | POA: Diagnosis not present

## 2022-08-17 LAB — CBC WITH DIFFERENTIAL/PLATELET
Abs Immature Granulocytes: 0.05 10*3/uL (ref 0.00–0.07)
Basophils Absolute: 0 10*3/uL (ref 0.0–0.1)
Basophils Relative: 0 %
Eosinophils Absolute: 0.1 10*3/uL (ref 0.0–0.5)
Eosinophils Relative: 2 %
HCT: 41.6 % (ref 39.0–52.0)
Hemoglobin: 13 g/dL (ref 13.0–17.0)
Immature Granulocytes: 1 %
Lymphocytes Relative: 14 %
Lymphs Abs: 1.1 10*3/uL (ref 0.7–4.0)
MCH: 22.2 pg — ABNORMAL LOW (ref 26.0–34.0)
MCHC: 31.3 g/dL (ref 30.0–36.0)
MCV: 71.1 fL — ABNORMAL LOW (ref 80.0–100.0)
Monocytes Absolute: 0.9 10*3/uL (ref 0.1–1.0)
Monocytes Relative: 11 %
Neutro Abs: 5.7 10*3/uL (ref 1.7–7.7)
Neutrophils Relative %: 72 %
Platelets: 159 10*3/uL (ref 150–400)
RBC: 5.85 MIL/uL — ABNORMAL HIGH (ref 4.22–5.81)
RDW: 16 % — ABNORMAL HIGH (ref 11.5–15.5)
WBC: 7.9 10*3/uL (ref 4.0–10.5)
nRBC: 0 % (ref 0.0–0.2)

## 2022-08-17 LAB — URINALYSIS, ROUTINE W REFLEX MICROSCOPIC
Bilirubin Urine: NEGATIVE
Glucose, UA: NEGATIVE mg/dL
Hgb urine dipstick: NEGATIVE
Ketones, ur: NEGATIVE mg/dL
Leukocytes,Ua: NEGATIVE
Nitrite: NEGATIVE
Protein, ur: NEGATIVE mg/dL
Specific Gravity, Urine: 1.021 (ref 1.005–1.030)
pH: 6 (ref 5.0–8.0)

## 2022-08-17 LAB — BASIC METABOLIC PANEL
Anion gap: 8 (ref 5–15)
BUN: 18 mg/dL (ref 8–23)
CO2: 23 mmol/L (ref 22–32)
Calcium: 8.9 mg/dL (ref 8.9–10.3)
Chloride: 106 mmol/L (ref 98–111)
Creatinine, Ser: 1.44 mg/dL — ABNORMAL HIGH (ref 0.61–1.24)
GFR, Estimated: 53 mL/min — ABNORMAL LOW (ref >60)
Glucose, Bld: 105 mg/dL — ABNORMAL HIGH (ref 70–99)
Potassium: 3.4 mmol/L — ABNORMAL LOW (ref 3.5–5.1)
Sodium: 137 mmol/L (ref 135–145)

## 2022-08-17 MED ORDER — TIZANIDINE HCL 4 MG PO TABS: 4.0000 mg | ORAL_TABLET | Freq: Three times a day (TID) | ORAL | 0 refills | Status: AC

## 2022-08-17 MED ORDER — HYDROCODONE-ACETAMINOPHEN 5-325 MG PO TABS
1.0000 | ORAL_TABLET | Freq: Once | ORAL | Status: AC
  Administered 2022-08-17: 1 via ORAL
  Filled 2022-08-17: qty 1

## 2022-08-17 MED ORDER — ACETAMINOPHEN 500 MG PO TABS
1000.0000 mg | ORAL_TABLET | Freq: Once | ORAL | Status: AC
  Administered 2022-08-17: 1000 mg via ORAL
  Filled 2022-08-17: qty 2

## 2022-08-17 MED ORDER — TIZANIDINE HCL 4 MG PO TABS
2.0000 mg | ORAL_TABLET | Freq: Once | ORAL | Status: AC
  Administered 2022-08-17: 2 mg via ORAL
  Filled 2022-08-17: qty 1

## 2022-08-17 NOTE — ED Provider Notes (Signed)
[facility] Provider Note   CSN: [id number] Arrival date & time: 08/17/22  1151     History  Chief Complaint  Patient presents with   Back Pain    Neck pain    Robert Durham is a 67 y.o. male.  HPI Patient presents with concern for neck and back pain.  Patient has multiple medical problems including kidney transplant 30 years ago.  He has been compliant with his medications, but over the past 2 days has developed pain from the neck throughout the back.  No anterior pain, no weakness in any extremity, no fevers, chills, nausea, vomiting, cough.  No relief with Tylenol, no obvious precipitant.    Home Medications Prior to Admission medications   Medication Sig Start Date End Date Taking? Authorizing Provider  atorvastatin (LIPITOR) 10 MG tablet Take 10 mg by mouth daily with breakfast.    Yes [provider]  cycloSPORINE (SANDIMMUNE) 100 MG capsule Take 100 mg by mouth 2 (two) times daily. Take with 25 mg tablet to equal 125 mg twice daily   Yes [provider]  cycloSPORINE (SANDIMMUNE) 25 MG capsule Take 25 mg by mouth 2 (two) times daily. Take with 100 mg tablet to equal dose of 125 mg twice daily   Yes [provider]  predniSONE (DELTASONE) 5 MG tablet Take 5 mg by mouth daily with breakfast.    Yes [provider]  promethazine (PHENERGAN) 25 MG tablet Take 25 mg by mouth every 6 (six) hours as needed for nausea or vomiting.   Yes [provider]  tamsulosin (FLOMAX) 0.4 MG CAPS capsule Take 0.4 mg by mouth daily after supper. 07/15/15  Yes [provider]  tiZANidine (ZANAFLEX) 4 MG tablet Take 1 tablet (4 mg total) by mouth 3 (three) times daily for 3 days. 08/17/22 08/20/22 Yes Carmin Muskrat, MD  vitamin B-12 (CYANOCOBALAMIN) 1000 MCG tablet Take 1,000 mcg by mouth daily.   Yes [provider]  vitamin C (ASCORBIC ACID) 500 MG tablet Take 500 mg by mouth daily. 12/23/14   Yes [provider]  amoxicillin-clavulanate (AUGMENTIN) 875-125 MG tablet Take 1 tablet by mouth every 12 (twelve) hours. Patient not taking: Reported on 08/17/2022 05/07/22   Robert Jefferson, PA-C  ondansetron (ZOFRAN-ODT) 4 MG disintegrating tablet Take 4 mg by mouth every 4 (four) hours as needed for nausea or vomiting. Patient not taking: Reported on 08/17/2022 05/07/22   [provider]  oxyCODONE-acetaminophen (PERCOCET/ROXICET) 5-325 MG tablet Take 1 tablet by mouth every 6 (six) hours as needed for moderate pain or severe pain. Patient not taking: Reported on 08/17/2022 05/07/22   [provider]      Allergies    Codeine, Enalapril maleate, Nsaids, Tape, Tolmetin, and Vasotec    Review of Systems   Review of Systems  All other systems reviewed and are negative.   Physical Exam Updated Vital Signs BP 110/72 (BP Location: Right Arm)   Pulse 95   Temp 98.1 F (36.7 C) (Oral)   Resp 18   Ht '5\' 11"'$  (1.803 m)   Wt 86.2 kg   SpO2 100%   BMI 26.50 kg/m  Physical Exam Vitals and nursing note reviewed.  Constitutional:      General: He is not in acute distress.    Appearance: He is well-developed.  HENT:     Head: Normocephalic and atraumatic.  Eyes:     Conjunctiva/sclera: Conjunctivae normal.  Neck:   Cardiovascular:  Rate and Rhythm: Normal rate and regular rhythm.  Pulmonary:     Effort: Pulmonary effort is normal. No respiratory distress.     Breath sounds: No stridor.  Abdominal:     General: There is no distension.  Skin:    General: Skin is warm and dry.  Neurological:     General: No focal deficit present.     Mental Status: He is alert and oriented to person, place, and time.     ED Results / Procedures / Treatments   Labs (all labs ordered are listed, but only abnormal results are displayed) Labs Reviewed  BASIC METABOLIC PANEL - Abnormal; Notable for the following components:      Result Value   Potassium 3.4 (*)     Glucose, Bld 105 (*)    Creatinine, Ser 1.44 (*)    GFR, Estimated 53 (*)    All other components within normal limits  CBC WITH DIFFERENTIAL/PLATELET - Abnormal; Notable for the following components:   RBC 5.85 (*)    MCV 71.1 (*)    MCH 22.2 (*)    RDW 16.0 (*)    All other components within normal limits  URINALYSIS, ROUTINE W REFLEX MICROSCOPIC    EKG None  Radiology No results found.  Procedures Procedures    Medications Ordered in ED Medications  acetaminophen (TYLENOL) tablet 1,000 mg (1,000 mg Oral Given 08/17/22 1230)  tiZANidine (ZANAFLEX) tablet 2 mg (2 mg Oral Given 08/17/22 1230)  HYDROcodone-acetaminophen (NORCO/VICODIN) 5-325 MG per tablet 1 tablet (1 tablet Oral Given 08/17/22 1336)    ED Course/ Medical Decision Making/ A&P                             Medical Decision Making Adult male with multiple medical issues including prior kidney transplant presents with back and neck pain.  Suspicion for musculoskeletal, but given his history, differential including renal dysfunction, infection considered.  Patient had labs, urinalysis ordered, received multiple medications.   Amount and/or Complexity of Data Reviewed External Data Reviewed: notes. Labs: ordered. Decision-making details documented in ED Course.  Risk OTC drugs. Prescription drug management. Decision regarding hospitalization.   3:09 PM Patient awake, alert, hemodynamically unremarkable, in no distress.  This adult male presents with back and low back and neck pain.  No neurodeficits, suspicion for musculoskeletal given his reassuring labs, no evidence for renal dysfunction, bacteremia, sepsis.  Patient improved here with analgesics was discharged in stable condition.        Final Clinical Impression(s) / ED Diagnoses Final diagnoses:  Neck pain  Acute midline thoracic back pain    Rx / DC Orders ED Discharge Orders          Ordered    tiZANidine (ZANAFLEX) 4 MG tablet  3 times  daily        08/17/22 1509              Carmin Muskrat, MD 08/17/22 [phone]

## 2022-08-17 NOTE — ED Triage Notes (Signed)
Pt states he has neck pain and back pain that radiates down his legs. Has been going on 3-5 days. Denies injury.   Pt has hx of kidney transplant, hernia repair.   Has not taken any OTC medications.

## 2022-08-17 NOTE — Discharge Instructions (Signed)
As discussed, your evaluation today has been largely reassuring.  But, it is important that you monitor your condition carefully, and do not hesitate to return to the ED if you develop new, or concerning changes in your condition. ? ?Otherwise, please follow-up with your physician for appropriate ongoing care. ? ?

## 2022-08-26 DIAGNOSIS — I10 Essential (primary) hypertension: Secondary | ICD-10-CM | POA: Diagnosis not present

## 2022-08-30 DIAGNOSIS — N182 Chronic kidney disease, stage 2 (mild): Secondary | ICD-10-CM | POA: Diagnosis not present

## 2022-08-30 DIAGNOSIS — M545 Low back pain, unspecified: Secondary | ICD-10-CM | POA: Diagnosis not present

## 2022-08-30 DIAGNOSIS — M542 Cervicalgia: Secondary | ICD-10-CM | POA: Diagnosis not present

## 2022-08-30 DIAGNOSIS — I10 Essential (primary) hypertension: Secondary | ICD-10-CM | POA: Diagnosis not present

## 2022-10-23 ENCOUNTER — Ambulatory Visit (INDEPENDENT_AMBULATORY_CARE_PROVIDER_SITE_OTHER): Payer: 59

## 2022-10-23 ENCOUNTER — Ambulatory Visit
Admission: EM | Admit: 2022-10-23 | Discharge: 2022-10-23 | Disposition: A | Discharge status: Home / Self Care | Payer: 59 | Attending: Nurse Practitioner | Admitting: Nurse Practitioner

## 2022-10-23 ENCOUNTER — Encounter: Payer: Self-pay | Admitting: Emergency Medicine

## 2022-10-23 DIAGNOSIS — M79674 Pain in right toe(s): Secondary | ICD-10-CM | POA: Diagnosis not present

## 2022-10-23 MED ORDER — DOXYCYCLINE HYCLATE 100 MG PO TABS: 100.0000 mg | ORAL_TABLET | Freq: Two times a day (BID) | ORAL | 0 refills | Status: DC

## 2022-10-23 NOTE — Discharge Instructions (Addendum)
The x-ray of the second toe of your right foot does not show an obvious fracture or dislocation. Take medication as prescribed. May take over-the-counter Tylenol Arthritis Strength 650mg  tablets every 8 hours as needed for pain or discomfort. May soak the right foot in warm Epsom salt soaks 2-3 times daily to help with foot pain and swelling. As discussed, it is recommended that you follow-up with your primary care physician or a podiatrist as soon as possible for further evaluation. Follow-up as needed.

## 2022-10-23 NOTE — ED Triage Notes (Signed)
Right second toe pain x 1 month.  States thinks area is infected beside nail.

## 2022-10-23 NOTE — ED Provider Notes (Signed)
[facility]    CSN: [id number] Arrival date & time: 10/23/22  1233      History   Chief Complaint No chief complaint on file.   HPI Robert Durham is a 67 y.o. male.   The history is provided by the patient.   The patient presents for complaints of pain to the right second toe.  Patient states symptoms have been present for the past month.  Patient denies any known injury or trauma.  He states over the last several days, the pain has gotten to the point where he cannot wear his shoe.  He states pain is worse at night when he lies down.  He denies radiation of pain, numbness, or tingling.  He states that pain worsens when he walks on it.  Patient states that he does have a history of diabetes, but currently does not take any medication for it.  Patient also reports that he does have history of a kidney transplant.  Review of patient's chart shows his most recent A1c done at [facility] was in November 2023, result was 5.9.  Past Medical History:  Diagnosis Date   Chronic back pain    Chronic hip pain    Chronic nausea    Depression    Diabetes mellitus    Diverticulitis    Gastritis    H/O kidney transplant    Hypertension    Kidney stone    Renal disorder    Urinary retention     Patient Active Problem List   Diagnosis Date Noted   Right inguinal hernia    Chest pain 03/20/2016    Past Surgical History:  Procedure Laterality Date   COLONOSCOPY  05/27/2003   [id number] terminal ileum,rectum, and colon   HERNIA REPAIR     HIP SURGERY Left    INGUINAL HERNIA REPAIR Right 03/02/2019   Procedure: HERNIA REPAIR INGUINAL ADULT WITH MESH;  Surgeon: Franky Macho, MD;  Location: [facility];  Service: General;  Laterality: Right;   KIDNEY TRANSPLANT     NEPHRECTOMY TRANSPLANTED ORGAN Left        Home Medications    Prior to Admission medications   Medication Sig Start Date End Date Taking? Authorizing Provider  doxycycline (VIBRA-TABS) 100 MG tablet Take  1 tablet (100 mg total) by mouth 2 (two) times daily. 10/23/22  Yes Chet Greenley-Warren, Sadie Haber, NP  atorvastatin (LIPITOR) 10 MG tablet Take 10 mg by mouth daily with breakfast.     [provider]  cycloSPORINE (SANDIMMUNE) 100 MG capsule Take 100 mg by mouth 2 (two) times daily. Take with 25 mg tablet to equal 125 mg twice daily    [provider]  cycloSPORINE (SANDIMMUNE) 25 MG capsule Take 25 mg by mouth 2 (two) times daily. Take with 100 mg tablet to equal dose of 125 mg twice daily    [provider]  ondansetron (ZOFRAN-ODT) 4 MG disintegrating tablet Take 4 mg by mouth every 4 (four) hours as needed for nausea or vomiting. Patient not taking: Reported on 08/17/2022 05/07/22   [provider]  oxyCODONE-acetaminophen (PERCOCET/ROXICET) 5-325 MG tablet Take 1 tablet by mouth every 6 (six) hours as needed for moderate pain or severe pain. Patient not taking: Reported on 08/17/2022 05/07/22   [provider]  promethazine (PHENERGAN) 25 MG tablet Take 25 mg by mouth every 6 (six) hours as needed for nausea or vomiting.    [provider]  tamsulosin (FLOMAX) 0.4 MG CAPS capsule Take  0.4 mg by mouth daily after supper. 07/15/15   [provider]  vitamin B-12 (CYANOCOBALAMIN) 1000 MCG tablet Take 1,000 mcg by mouth daily.    [provider]  vitamin C (ASCORBIC ACID) 500 MG tablet Take 500 mg by mouth daily. 12/23/14   [provider]    Family History Family History  Problem Relation Age of Onset   Hypertension Sister     Social History Social History   Tobacco Use   Smoking status: Former    Packs/day: 0.00    Years: 5.00    Additional pack years: 0.00    Total pack years: 0.00    Types: Cigarettes    Quit date: 06/14/1972    Years since quitting: 50.3   Smokeless tobacco: Never  Vaping Use   Vaping Use: Never used  Substance Use Topics   Alcohol use: No   Drug use: No     Allergies   Codeine,  Enalapril maleate, Nsaids, Tape, Tolmetin, and Vasotec   Review of Systems Review of Systems Per HPI  Physical Exam Triage Vital Signs ED Triage Vitals  Enc Vitals Group     BP 10/23/22 1238 116/72     Pulse Rate 10/23/22 1238 71     Resp 10/23/22 1238 18     Temp 10/23/22 1238 97.7 F (36.5 C)     Temp Source 10/23/22 1238 Oral     SpO2 10/23/22 1238 98 %     Weight --      Height --      Head Circumference --      Peak Flow --      Pain Score 10/23/22 1240 9     Pain Loc --      Pain Edu? --      Excl. in GC? --    No data found.  Updated Vital Signs BP 116/72 (BP Location: Right Arm)   Pulse 71   Temp 97.7 F (36.5 C) (Oral)   Resp 18   SpO2 98%   Visual Acuity Right Eye Distance:   Left Eye Distance:   Bilateral Distance:    Right Eye Near:   Left Eye Near:    Bilateral Near:     Physical Exam Vitals and nursing note reviewed.  Constitutional:      General: He is not in acute distress.    Appearance: Normal appearance.  HENT:     Head: Normocephalic.  Eyes:     Extraocular Movements: Extraocular movements intact.     Pupils: Pupils are equal, round, and reactive to light.  Cardiovascular:     Rate and Rhythm: Normal rate and regular rhythm.     Pulses: Normal pulses.     Heart sounds: Normal heart sounds.  Pulmonary:     Effort: Pulmonary effort is normal.     Breath sounds: Normal breath sounds.  Abdominal:     General: Bowel sounds are normal.     Palpations: Abdomen is soft.  Feet:     Right foot:     Skin integrity: Skin integrity normal.     Comments: Medial aspect of the toenail of the right second toe is jagged, with uneven edges.  Toenail is thick, suggestive of possible fungal infection.  Nailbed of the second toe is dark.  No obvious injury or trauma noted to the right second toe.  No oozing, fluctuance, warmth, or drainage present. Skin:    General: Skin is warm and dry.  Neurological:  General: No focal deficit present.      Mental Status: He is alert and oriented to person, place, and time.  Psychiatric:        Mood and Affect: Mood normal.        Behavior: Behavior normal.      UC Treatments / Results  Labs (all labs ordered are listed, but only abnormal results are displayed) Labs Reviewed - No data to display  EKG   Radiology DG Toe 2nd Right  Result Date: 10/23/2022 CLINICAL DATA:  Pain for a month.  Diabetes.  No injury. EXAM: RIGHT SECOND TOE COMPARISON:  None Available. FINDINGS: Vascular calcifications noted. No fractures identified. No definite bony erosion noted. Evaluation of the distal second phalanx is somewhat limited as the second toe is flexed. IMPRESSION: No cause for pain identified. No definite bony erosion. Evaluation of the distal second phalanx is somewhat limited as the second toe is flexed. If there is concern for osteomyelitis, recommend an MRI. Electronically Signed   By: Gerome Sam III M.D.   On: 10/23/2022 13:06    Procedures Procedures (including critical care time)  Medications Ordered in UC Medications - No data to display  Initial Impression / Assessment and Plan / UC Course  I have reviewed the triage vital signs and the nursing notes.  Pertinent labs & imaging results that were available during my care of the patient were reviewed by me and considered in my medical decision making (see chart for details).  The patient is well-appearing, he is in no acute distress, vital signs are stable.  X-ray of the right second toe does not show an obvious fracture or dislocation.  Will treat patient empirically for possible infection with doxycycline 100 mg tablets.  Supportive care recommendations were provided and discussed with the patient to include use of Tylenol arthritis strength tablets and warm Epsom salt soaks.  Patient was advised that he should follow-up with his primary care physician or with podiatry for further evaluation.  Patient is in agreement with this plan  of care and verbalizes understanding.  All questions were answered.  Patient stable for discharge.   Final Clinical Impressions(s) / UC Diagnoses   Final diagnoses:  Pain in toe of right foot     Discharge Instructions      The x-ray of the second toe of your right foot does not show an obvious fracture or dislocation. Take medication as prescribed. May take over-the-counter Tylenol Arthritis Strength 650mg  tablets every 8 hours as needed for pain or discomfort. May soak the right foot in warm Epsom salt soaks 2-3 times daily to help with foot pain and swelling. As discussed, it is recommended that you follow-up with your primary care physician or a podiatrist as soon as possible for further evaluation. Follow-up as needed.      ED Prescriptions     Medication Sig Dispense Auth. Provider   doxycycline (VIBRA-TABS) 100 MG tablet Take 1 tablet (100 mg total) by mouth 2 (two) times daily. 10 tablet Jaleel Allen-Warren, Sadie Haber, NP      PDMP not reviewed this encounter.   Abran Cantor, NP 10/23/22 1317

## 2022-11-02 ENCOUNTER — Ambulatory Visit (INDEPENDENT_AMBULATORY_CARE_PROVIDER_SITE_OTHER): Payer: 59

## 2022-11-02 ENCOUNTER — Encounter: Payer: Self-pay | Admitting: Emergency Medicine

## 2022-11-02 ENCOUNTER — Ambulatory Visit
Admission: EM | Admit: 2022-11-02 | Discharge: 2022-11-02 | Disposition: A | Discharge status: Home / Self Care | Payer: 59 | Attending: Nurse Practitioner | Admitting: Nurse Practitioner

## 2022-11-02 DIAGNOSIS — S63501A Unspecified sprain of right wrist, initial encounter: Secondary | ICD-10-CM

## 2022-11-02 DIAGNOSIS — M7989 Other specified soft tissue disorders: Secondary | ICD-10-CM | POA: Diagnosis not present

## 2022-11-02 NOTE — ED Triage Notes (Signed)
Pain in right wrist area since yesterday after trying to change a tire.

## 2022-11-02 NOTE — ED Provider Notes (Signed)
[facility]    CSN: [id number] Arrival date & time: 11/02/22  4098      History   Chief Complaint No chief complaint on file.   HPI Robert Durham is a 67 y.o. male.   The history is provided by the patient.   Patient presents for complaints of right wrist and hand pain that started yesterday after he was trying to lift a spare tire out of the trunk.  Patient states as he was lifting the tire, he felt a "pop" in the right wrist.  Since that time, he complains of pain that radiates into his fingers, and decreased range of motion along with decreased grip strength.  Patient states that he tried to open a box this morning, and forgot that he had injured his wrist and had severe pain.  Patient reports that he did have a fracture in the right hand several years ago.  Patient states that he is right-hand dominant.  He reports that he took Tylenol for his pain with minimal relief.  Past Medical History:  Diagnosis Date   Chronic back pain    Chronic hip pain    Chronic nausea    Depression    Diabetes mellitus    Diverticulitis    Gastritis    H/O kidney transplant    Hypertension    Kidney stone    Renal disorder    Urinary retention     Patient Active Problem List   Diagnosis Date Noted   Right inguinal hernia    Chest pain 03/20/2016    Past Surgical History:  Procedure Laterality Date   COLONOSCOPY  05/27/2003   [id number] terminal ileum,rectum, and colon   HERNIA REPAIR     HIP SURGERY Left    INGUINAL HERNIA REPAIR Right 03/02/2019   Procedure: HERNIA REPAIR INGUINAL ADULT WITH MESH;  Surgeon: Franky Macho, MD;  Location: [facility];  Service: General;  Laterality: Right;   KIDNEY TRANSPLANT     NEPHRECTOMY TRANSPLANTED ORGAN Left        Home Medications    Prior to Admission medications   Medication Sig Start Date End Date Taking? Authorizing Provider  atorvastatin (LIPITOR) 10 MG tablet Take 10 mg by mouth daily with breakfast.     [provider]  cycloSPORINE (SANDIMMUNE) 100 MG capsule Take 100 mg by mouth 2 (two) times daily. Take with 25 mg tablet to equal 125 mg twice daily    [provider]  cycloSPORINE (SANDIMMUNE) 25 MG capsule Take 25 mg by mouth 2 (two) times daily. Take with 100 mg tablet to equal dose of 125 mg twice daily    [provider]  doxycycline (VIBRA-TABS) 100 MG tablet Take 1 tablet (100 mg total) by mouth 2 (two) times daily. 10/23/22   Lawson Mahone-Warren, Sadie Haber, NP  ondansetron (ZOFRAN-ODT) 4 MG disintegrating tablet Take 4 mg by mouth every 4 (four) hours as needed for nausea or vomiting. Patient not taking: Reported on 08/17/2022 05/07/22   [provider]  promethazine (PHENERGAN) 25 MG tablet Take 25 mg by mouth every 6 (six) hours as needed for nausea or vomiting.    [provider]  tamsulosin (FLOMAX) 0.4 MG CAPS capsule Take 0.4 mg by mouth daily after supper. 07/15/15   [provider]  vitamin B-12 (CYANOCOBALAMIN) 1000 MCG tablet Take 1,000 mcg by mouth daily.    [provider]  vitamin C (ASCORBIC ACID) 500 MG tablet Take 500 mg by mouth daily.  12/23/14   [provider]    Family History Family History  Problem Relation Age of Onset   Hypertension Sister     Social History Social History   Tobacco Use   Smoking status: Former    Packs/day: 0.00    Years: 5.00    Additional pack years: 0.00    Total pack years: 0.00    Types: Cigarettes    Quit date: 06/14/1972    Years since quitting: 50.4   Smokeless tobacco: Never  Vaping Use   Vaping Use: Never used  Substance Use Topics   Alcohol use: No   Drug use: No     Allergies   Codeine, Enalapril maleate, Nsaids, Tape, Tolmetin, and Vasotec   Review of Systems Review of Systems Per HPI  Physical Exam Triage Vital Signs ED Triage Vitals  Enc Vitals Group     BP 11/02/22 0953 119/81     Pulse Rate 11/02/22 0953 65     Resp 11/02/22 0953 18     Temp  11/02/22 0953 98 F (36.7 C)     Temp Source 11/02/22 0953 Oral     SpO2 11/02/22 0953 98 %     Weight --      Height --      Head Circumference --      Peak Flow --      Pain Score 11/02/22 0955 9     Pain Loc --      Pain Edu? --      Excl. in GC? --    No data found.  Updated Vital Signs BP 119/81 (BP Location: Right Arm)   Pulse 65   Temp 98 F (36.7 C) (Oral)   Resp 18   SpO2 98%   Visual Acuity Right Eye Distance:   Left Eye Distance:   Bilateral Distance:    Right Eye Near:   Left Eye Near:    Bilateral Near:     Physical Exam Vitals and nursing note reviewed.  Constitutional:      General: He is not in acute distress.    Appearance: Normal appearance.  HENT:     Head: Normocephalic.  Eyes:     Extraocular Movements: Extraocular movements intact.     Pupils: Pupils are equal, round, and reactive to light.  Pulmonary:     Effort: Pulmonary effort is normal.  Musculoskeletal:     Right wrist: Swelling and tenderness (dorsal and ulnar aspect of right wrist) present. Decreased range of motion (d/t pain). Normal pulse.     Right hand: Decreased strength of wrist extension.  Skin:    General: Skin is warm and dry.  Neurological:     General: No focal deficit present.     Mental Status: He is alert and oriented to person, place, and time.  Psychiatric:        Mood and Affect: Mood normal.        Behavior: Behavior normal.      UC Treatments / Results  Labs (all labs ordered are listed, but only abnormal results are displayed) Labs Reviewed - No data to display  EKG   Radiology DG Wrist Complete Right  Result Date: 11/02/2022 CLINICAL DATA:  Ulnar-sided wrist pain after changing a tire yesterday. EXAM: RIGHT WRIST - COMPLETE 3+ VIEW COMPARISON:  Right forearm x-rays dated January 16, 2016. FINDINGS: No acute fracture or dislocation. Old healed fifth metacarpal fracture status post pinning. Unchanged small well corticated ossific density overlying the  carpal bones on the lateral view, likely an old triquetral fracture. Joint spaces are preserved. Focal soft tissue swelling along the distal ulna. IMPRESSION: 1. Focal soft tissue swelling along the distal ulna. No acute osseous abnormality. Electronically Signed   By: Obie Dredge M.D.   On: 11/02/2022 10:20    Procedures Procedures (including critical care time)  Medications Ordered in UC Medications - No data to display  Initial Impression / Assessment and Plan / UC Course  I have reviewed the triage vital signs and the nursing notes.  Pertinent labs & imaging results that were available during my care of the patient were reviewed by me and considered in my medical decision making (see chart for details).  The patient is well-appearing, he is in no acute distress, vital signs x-ray of the right wrist is negative for fracture or dislocation.  Symptoms appear to be consistent with a strain/sprain of the wrist.  Patient was provided a brace to provide compression and support.  Supportive care recommendations were provided and discussed with the patient to include RICE therapy, continued use of over-the-counter analgesics, and range of motion exercises.  Patient was advised to follow-up orthopedics if symptoms or not improving over the next 1 to 2 weeks, or sooner if symptoms worsen.  Patient is in agreement with this plan of care here and verbalizes understanding.  All questions were answered.  Patient stable for discharge.   Final Clinical Impressions(s) / UC Diagnoses   Final diagnoses:  Sprain of right wrist, initial encounter     Discharge Instructions      The x-ray is negative for fracture or dislocation. You have been provided a wrist brace to provide compression and support.  Wear the brace when you are engaged in prolonged or strenuous activity. Gentle range of motion exercises to the wrist to prevent worsening symptoms. May take over-the-counter Tylenol as needed for pain,  fever, general discomfort. RICE therapy, rest, ice, compression, and elevation.  Apply ice for 20 minutes, remove for 1 hour, then repeat as needed. If symptoms or not improved or improving over the next 1 to 2 weeks, recommend following up with orthopedics for further evaluation.  She can follow-up with [facility] at [phone] or with [facility] at [phone]. Follow-up as needed.     ED Prescriptions   None    PDMP not reviewed this encounter.   Abran Cantor, NP 11/02/22 1032

## 2022-11-02 NOTE — Discharge Instructions (Signed)
The x-ray is negative for fracture or dislocation. You have been provided a wrist brace to provide compression and support.  Wear the brace when you are engaged in prolonged or strenuous activity. Gentle range of motion exercises to the wrist to prevent worsening symptoms. May take over-the-counter Tylenol as needed for pain, fever, general discomfort. RICE therapy, rest, ice, compression, and elevation.  Apply ice for 20 minutes, remove for 1 hour, then repeat as needed. If symptoms or not improved or improving over the next 1 to 2 weeks, recommend following up with orthopedics for further evaluation.  She can follow-up with [facility] at [phone] or with [facility] at [phone]. Follow-up as needed.

## 2022-11-22 DIAGNOSIS — Z0001 Encounter for general adult medical examination with abnormal findings: Secondary | ICD-10-CM | POA: Diagnosis not present

## 2022-11-22 DIAGNOSIS — M150 Primary generalized (osteo)arthritis: Secondary | ICD-10-CM | POA: Diagnosis not present

## 2022-11-22 DIAGNOSIS — M25531 Pain in right wrist: Secondary | ICD-10-CM | POA: Diagnosis not present

## 2022-11-22 DIAGNOSIS — N182 Chronic kidney disease, stage 2 (mild): Secondary | ICD-10-CM | POA: Diagnosis not present

## 2022-11-22 DIAGNOSIS — I10 Essential (primary) hypertension: Secondary | ICD-10-CM | POA: Diagnosis not present

## 2022-12-22 ENCOUNTER — Ambulatory Visit
Admission: EM | Admit: 2022-12-22 | Discharge: 2022-12-22 | Disposition: A | Discharge status: Home / Self Care | Payer: 59 | Attending: Nurse Practitioner | Admitting: Nurse Practitioner

## 2022-12-22 ENCOUNTER — Encounter: Payer: Self-pay | Admitting: Emergency Medicine

## 2022-12-22 ENCOUNTER — Other Ambulatory Visit: Payer: Self-pay

## 2022-12-22 DIAGNOSIS — M5441 Lumbago with sciatica, right side: Secondary | ICD-10-CM | POA: Diagnosis not present

## 2022-12-22 DIAGNOSIS — M5442 Lumbago with sciatica, left side: Secondary | ICD-10-CM | POA: Diagnosis not present

## 2022-12-22 DIAGNOSIS — G8929 Other chronic pain: Secondary | ICD-10-CM | POA: Diagnosis not present

## 2022-12-22 MED ORDER — PREDNISONE 20 MG PO TABS: 40.0000 mg | ORAL_TABLET | Freq: Every day | ORAL | 0 refills | Status: AC

## 2022-12-22 MED ORDER — TIZANIDINE HCL 2 MG PO TABS: 2.0000 mg | ORAL_TABLET | Freq: Three times a day (TID) | ORAL | 0 refills | Status: DC | PRN

## 2022-12-22 MED ORDER — DEXAMETHASONE SODIUM PHOSPHATE 10 MG/ML IJ SOLN
10.0000 mg | INTRAMUSCULAR | Status: AC
  Administered 2022-12-22: 10 mg via INTRAMUSCULAR

## 2022-12-22 NOTE — ED Triage Notes (Signed)
Pt reports lower back pain with intermittent radiation of pain to BLE. Pt denies any GI/GU symptoms or known injury. Reports contacted orthopedic provided for "possible muscle relaxers" but has not heard anything from office.

## 2022-12-22 NOTE — Discharge Instructions (Signed)
You are given an injection of Decadron 10 mg.  Do not start the prednisone until 12/23/2022. Take medication as prescribed.  You were prescribed a low-dose muscle relaxer.  Do not drive, operate heavy equipment, or drink alcohol while taking the medication. Continue over-the-counter Tylenol.  Recommend Tylenol Arthritis Strength 650 mg tablets, 1 tablet every 8 hours while symptoms persist. Try to remain as active as possible. Gentle range of motion and stretching exercises to help with back spasm and pain.  I have provided exercises for you to perform at home.  Try to perform them at least 2-3 times daily while symptoms persist. May apply ice or heat as needed.  Ice is recommended for pain or swelling, heat for spasm or stiffness.  Apply for 20 minutes, remove for 1 hour, then repeat. Go to the emergency department immediately if you develop weakness in your legs or feet, inability to walk, loss of bowel or bladder function, difficulty urinating or passing a bowel movement, or other concerns. If symptoms do not improve, please follow-up with your primary care physician for further evaluation. Follow-up as needed.

## 2022-12-22 NOTE — ED Provider Notes (Signed)
[facility]    CSN: [id number] Arrival date & time: 12/22/22  4098      History   Chief Complaint Chief Complaint  Patient presents with   Back Pain    HPI Robert Durham is a 67 y.o. male.   The history is provided by the patient.   The patient presents for complaints of bilateral low back pain with radiation to the lower extremities.  Patient states symptoms started approximately 5 days ago.  He describes the pain as "sharp".  Patient states walking makes his pain worse at this time.  He states that the pain disrupted his sleep last evening.  Patient reports history of chronic back pain.  Patient denies any new injury or trauma, fever, chills, chest pain, abdominal pain, urinary symptoms, loss of bowel or bladder function, lower extremity weakness, numbness, or tingling.  Patient states he has tried to take Tylenol, with minimal relief.  He did reach out to his primary care physician's office, but he could not get there due to his increased level of pain.  Patient also reports history of left hip surgery.  Past Medical History:  Diagnosis Date   Chronic back pain    Chronic hip pain    Chronic nausea    Depression    Diabetes mellitus    Diverticulitis    Gastritis    H/O kidney transplant    Hypertension    Kidney stone    Renal disorder    Urinary retention     Patient Active Problem List   Diagnosis Date Noted   Right inguinal hernia    Chest pain 03/20/2016    Past Surgical History:  Procedure Laterality Date   COLONOSCOPY  05/27/2003   [id number] terminal ileum,rectum, and colon   HERNIA REPAIR     HIP SURGERY Left    INGUINAL HERNIA REPAIR Right 03/02/2019   Procedure: HERNIA REPAIR INGUINAL ADULT WITH MESH;  Surgeon: Franky Macho, MD;  Location: [facility];  Service: General;  Laterality: Right;   KIDNEY TRANSPLANT     NEPHRECTOMY TRANSPLANTED ORGAN Left        Home Medications    Prior to Admission medications   Medication Sig Start  Date End Date Taking? Authorizing Provider  predniSONE (DELTASONE) 20 MG tablet Take 2 tablets (40 mg total) by mouth daily with breakfast for 5 days. 12/22/22 12/27/22 Yes Kiril Hippe-Warren, Sadie Haber, NP  tiZANidine (ZANAFLEX) 2 MG tablet Take 1 tablet (2 mg total) by mouth every 8 (eight) hours as needed for muscle spasms. 12/22/22  Yes Shloma Roggenkamp-Warren, Sadie Haber, NP  atorvastatin (LIPITOR) 10 MG tablet Take 10 mg by mouth daily with breakfast.     [provider]  cycloSPORINE (SANDIMMUNE) 100 MG capsule Take 100 mg by mouth 2 (two) times daily. Take with 25 mg tablet to equal 125 mg twice daily    [provider]  cycloSPORINE (SANDIMMUNE) 25 MG capsule Take 25 mg by mouth 2 (two) times daily. Take with 100 mg tablet to equal dose of 125 mg twice daily    [provider]  doxycycline (VIBRA-TABS) 100 MG tablet Take 1 tablet (100 mg total) by mouth 2 (two) times daily. 10/23/22   Shaneika Rossa-Warren, Sadie Haber, NP  ondansetron (ZOFRAN-ODT) 4 MG disintegrating tablet Take 4 mg by mouth every 4 (four) hours as needed for nausea or vomiting. Patient not taking: Reported on 08/17/2022 05/07/22   [provider]  promethazine (PHENERGAN) 25 MG tablet Take 25 mg by  mouth every 6 (six) hours as needed for nausea or vomiting.    [provider]  tamsulosin (FLOMAX) 0.4 MG CAPS capsule Take 0.4 mg by mouth daily after supper. 07/15/15   [provider]  vitamin B-12 (CYANOCOBALAMIN) 1000 MCG tablet Take 1,000 mcg by mouth daily.    [provider]  vitamin C (ASCORBIC ACID) 500 MG tablet Take 500 mg by mouth daily. 12/23/14   [provider]    Family History Family History  Problem Relation Age of Onset   Hypertension Sister     Social History Social History   Tobacco Use   Smoking status: Former    Packs/day: 0.00    Years: 5.00    Additional pack years: 0.00    Total pack years: 0.00    Types: Cigarettes    Quit date: 06/14/1972    Years  since quitting: 50.5   Smokeless tobacco: Never  Vaping Use   Vaping Use: Never used  Substance Use Topics   Alcohol use: No   Drug use: No     Allergies   Codeine, Enalapril maleate, Nsaids, Tape, Tolmetin, and Vasotec   Review of Systems Review of Systems Per HPI  Physical Exam Triage Vital Signs ED Triage Vitals  Enc Vitals Group     BP 12/22/22 0933 120/84     Pulse Rate 12/22/22 0933 78     Resp 12/22/22 0933 20     Temp 12/22/22 0933 97.8 F (36.6 C)     Temp Source 12/22/22 0933 Oral     SpO2 12/22/22 0933 98 %     Weight --      Height --      Head Circumference --      Peak Flow --      Pain Score 12/22/22 0934 10     Pain Loc --      Pain Edu? --      Excl. in GC? --    No data found.  Updated Vital Signs BP 120/84 (BP Location: Right Arm)   Pulse 78   Temp 97.8 F (36.6 C) (Oral)   Resp 20   SpO2 98%   Visual Acuity Right Eye Distance:   Left Eye Distance:   Bilateral Distance:    Right Eye Near:   Left Eye Near:    Bilateral Near:     Physical Exam Vitals and nursing note reviewed.  Constitutional:      General: He is not in acute distress.    Appearance: Normal appearance.  HENT:     Head: Normocephalic.  Eyes:     Extraocular Movements: Extraocular movements intact.     Pupils: Pupils are equal, round, and reactive to light.  Cardiovascular:     Rate and Rhythm: Normal rate and regular rhythm.     Pulses: Normal pulses.     Heart sounds: Normal heart sounds.  Pulmonary:     Effort: Pulmonary effort is normal. No respiratory distress.     Breath sounds: Normal breath sounds. No stridor. No wheezing, rhonchi or rales.  Abdominal:     General: Bowel sounds are normal.     Palpations: Abdomen is soft.     Tenderness: There is no abdominal tenderness.  Musculoskeletal:     Cervical back: Normal range of motion.     Lumbar back: Spasms and tenderness (Bilateral paraspinal regions between L1-L4) present. No swelling, edema,  deformity, signs of trauma or lacerations. Decreased range of motion.  Lymphadenopathy:  Cervical: No cervical adenopathy.  Skin:    General: Skin is warm and dry.  Neurological:     General: No focal deficit present.     Mental Status: He is alert and oriented to person, place, and time.  Psychiatric:        Mood and Affect: Mood normal.        Behavior: Behavior normal.      UC Treatments / Results  Labs (all labs ordered are listed, but only abnormal results are displayed) Labs Reviewed - No data to display  EKG   Radiology No results found.  Procedures Procedures (including critical care time)  Medications Ordered in UC Medications  dexamethasone (DECADRON) injection 10 mg (10 mg Intramuscular Given by Other 12/22/22 0955)    Initial Impression / Assessment and Plan / UC Course  I have reviewed the triage vital signs and the nursing notes.  Pertinent labs & imaging results that were available during my care of the patient were reviewed by me and considered in my medical decision making (see chart for details).  The patient is well-appearing, he is in no acute distress, vital signs are stable.  Symptoms appear to be consistent with ongoing chronic low back pain.  Decadron 10 mg IM was administered.  Will defer Toradol injection as patient is status post kidney transplant and most recent creatinine in 08/17/2022 was 1.44.  Patient was prescribed prednisone 40 mg for possible inflammation.  Patient was also given a low-dose muscle relaxer for back tightness and stiffness.  Supportive care recommendations were provided and discussed with the patient to include the use of ice or heat, stretching, and remaining active.  Patient was advised to follow-up if symptoms do not improve.  Patient was given strict ER follow-up precautions.  Patient is in agreement with this plan of care and verbalizes understanding.  All questions were answered.  Patient stable for discharge.  Final  Clinical Impressions(s) / UC Diagnoses   Final diagnoses:  Chronic bilateral low back pain with bilateral sciatica     Discharge Instructions      You are given an injection of Decadron 10 mg.  Do not start the prednisone until 12/23/2022. Take medication as prescribed.  You were prescribed a low-dose muscle relaxer.  Do not drive, operate heavy equipment, or drink alcohol while taking the medication. Continue over-the-counter Tylenol.  Recommend Tylenol Arthritis Strength 650 mg tablets, 1 tablet every 8 hours while symptoms persist. Try to remain as active as possible. Gentle range of motion and stretching exercises to help with back spasm and pain.  I have provided exercises for you to perform at home.  Try to perform them at least 2-3 times daily while symptoms persist. May apply ice or heat as needed.  Ice is recommended for pain or swelling, heat for spasm or stiffness.  Apply for 20 minutes, remove for 1 hour, then repeat. Go to the emergency department immediately if you develop weakness in your legs or feet, inability to walk, loss of bowel or bladder function, difficulty urinating or passing a bowel movement, or other concerns. If symptoms do not improve, please follow-up with your primary care physician for further evaluation. Follow-up as needed.     ED Prescriptions     Medication Sig Dispense Auth. Provider   predniSONE (DELTASONE) 20 MG tablet Take 2 tablets (40 mg total) by mouth daily with breakfast for 5 days. 10 tablet Emorie Mcfate-Warren, Sadie Haber, NP   tiZANidine (ZANAFLEX) 2 MG tablet Take 1  tablet (2 mg total) by mouth every 8 (eight) hours as needed for muscle spasms. 20 tablet Armando Lauman-Warren, Sadie Haber, NP      PDMP not reviewed this encounter.   Abran Cantor, NP 12/22/22 1002

## 2023-02-22 DIAGNOSIS — Z940 Kidney transplant status: Secondary | ICD-10-CM | POA: Diagnosis not present

## 2023-02-22 DIAGNOSIS — D638 Anemia in other chronic diseases classified elsewhere: Secondary | ICD-10-CM | POA: Diagnosis not present

## 2023-02-22 DIAGNOSIS — Z79621 Long term (current) use of calcineurin inhibitor: Secondary | ICD-10-CM | POA: Diagnosis not present

## 2023-02-22 DIAGNOSIS — Z4822 Encounter for aftercare following kidney transplant: Secondary | ICD-10-CM | POA: Diagnosis not present

## 2023-02-22 DIAGNOSIS — D84821 Immunodeficiency due to drugs: Secondary | ICD-10-CM | POA: Diagnosis not present

## 2023-02-22 DIAGNOSIS — R7303 Prediabetes: Secondary | ICD-10-CM | POA: Diagnosis not present

## 2023-03-01 DIAGNOSIS — I10 Essential (primary) hypertension: Secondary | ICD-10-CM | POA: Diagnosis not present

## 2023-03-01 DIAGNOSIS — M150 Primary generalized (osteo)arthritis: Secondary | ICD-10-CM | POA: Diagnosis not present

## 2023-03-01 DIAGNOSIS — N182 Chronic kidney disease, stage 2 (mild): Secondary | ICD-10-CM | POA: Diagnosis not present

## 2023-03-31 ENCOUNTER — Other Ambulatory Visit: Payer: Self-pay

## 2023-03-31 ENCOUNTER — Ambulatory Visit
Admission: EM | Admit: 2023-03-31 | Discharge: 2023-03-31 | Disposition: A | Discharge status: Home / Self Care | Payer: 59

## 2023-03-31 ENCOUNTER — Encounter (HOSPITAL_COMMUNITY): Payer: Self-pay | Admitting: Emergency Medicine

## 2023-03-31 ENCOUNTER — Encounter: Payer: Self-pay | Admitting: Emergency Medicine

## 2023-03-31 ENCOUNTER — Emergency Department (HOSPITAL_COMMUNITY)
Admission: EM | Admit: 2023-03-31 | Discharge: 2023-03-31 | Disposition: A | Discharge status: Home / Self Care | Payer: 59

## 2023-03-31 ENCOUNTER — Emergency Department (HOSPITAL_COMMUNITY): Payer: 59

## 2023-03-31 DIAGNOSIS — R0602 Shortness of breath: Secondary | ICD-10-CM | POA: Insufficient documentation

## 2023-03-31 DIAGNOSIS — R079 Chest pain, unspecified: Secondary | ICD-10-CM | POA: Diagnosis not present

## 2023-03-31 DIAGNOSIS — R0682 Tachypnea, not elsewhere classified: Secondary | ICD-10-CM

## 2023-03-31 DIAGNOSIS — Z20822 Contact with and (suspected) exposure to covid-19: Secondary | ICD-10-CM | POA: Diagnosis not present

## 2023-03-31 DIAGNOSIS — R0789 Other chest pain: Secondary | ICD-10-CM | POA: Diagnosis not present

## 2023-03-31 LAB — RESP PANEL BY RT-PCR (RSV, FLU A&B, COVID)  RVPGX2
Influenza A by PCR: NEGATIVE
Influenza B by PCR: NEGATIVE
Resp Syncytial Virus by PCR: NEGATIVE
SARS Coronavirus 2 by RT PCR: NEGATIVE

## 2023-03-31 LAB — CBC
HCT: 43.1 % (ref 39.0–52.0)
Hemoglobin: 13.7 g/dL (ref 13.0–17.0)
MCH: 22.6 pg — ABNORMAL LOW (ref 26.0–34.0)
MCHC: 31.8 g/dL (ref 30.0–36.0)
MCV: 71.1 fL — ABNORMAL LOW (ref 80.0–100.0)
Platelets: 170 10*3/uL (ref 150–400)
RBC: 6.06 MIL/uL — ABNORMAL HIGH (ref 4.22–5.81)
RDW: 17.7 % — ABNORMAL HIGH (ref 11.5–15.5)
WBC: 7.4 10*3/uL (ref 4.0–10.5)
nRBC: 0 % (ref 0.0–0.2)

## 2023-03-31 LAB — BASIC METABOLIC PANEL
Anion gap: 10 (ref 5–15)
BUN: 15 mg/dL (ref 8–23)
CO2: 23 mmol/L (ref 22–32)
Calcium: 9.3 mg/dL (ref 8.9–10.3)
Chloride: 104 mmol/L (ref 98–111)
Creatinine, Ser: 1.22 mg/dL (ref 0.61–1.24)
GFR, Estimated: >60 mL/min (ref >60)
Glucose, Bld: 89 mg/dL (ref 70–99)
Potassium: 3.4 mmol/L — ABNORMAL LOW (ref 3.5–5.1)
Sodium: 137 mmol/L (ref 135–145)

## 2023-03-31 LAB — TROPONIN I (HIGH SENSITIVITY)
Troponin I (High Sensitivity): 5 ng/L (ref <18)
Troponin I (High Sensitivity): 4 ng/L (ref <18)

## 2023-03-31 MED ORDER — LIDOCAINE VISCOUS HCL 2 % MT SOLN
15.0000 mL | Freq: Once | OROMUCOSAL | Status: AC
  Administered 2023-03-31: 15 mL via OROMUCOSAL

## 2023-03-31 MED ORDER — ALUM & MAG HYDROXIDE-SIMETH 200-200-20 MG/5ML PO SUSP
30.0000 mL | Freq: Once | ORAL | Status: AC
  Administered 2023-03-31: 30 mL via ORAL

## 2023-03-31 NOTE — ED Provider Triage Note (Signed)
Emergency Medicine Provider Triage Evaluation Note  Robert Durham , a 67 y.o. male  was evaluated in triage.  Pt complains of chest discomfort   Review of Systems  Positive: cough Negative: No fever  Physical Exam  BP (!) 137/116   Pulse 74   Temp 98.1 F (36.7 C) (Oral)   Resp 18   Ht 5\' 11"  (1.803 m)   Wt 84 kg   SpO2 100%   BMI 25.82 kg/m  Gen:   Awake, no distress   Resp:  Normal effort  MSK:   Moves extremities without difficulty  Other:    Medical Decision Making  Medically screening exam initiated at 1:11 PM.  Appropriate orders placed.  Robert Durham was informed that the remainder of the evaluation will be completed by another provider, this initial triage assessment does not replace that evaluation, and the importance of remaining in the ED until their evaluation is complete.     Elson Areas, [place] 03/31/23 1312

## 2023-03-31 NOTE — Discharge Instructions (Addendum)
Go to the emergency department for further evaluation

## 2023-03-31 NOTE — ED Notes (Signed)
Patient is being discharged from the Urgent Care and sent to the Emergency Department via private vehicle . Per NP, patient is in need of higher level of care due to Chest pain. Patient is aware and verbalizes understanding of plan of care.  Vitals:   03/31/23 0940 03/31/23 0959  BP: 123/82   Pulse: 76   Resp: (!) 30 (!) 24  Temp: 97.9 F (36.6 C)   SpO2: 100%

## 2023-03-31 NOTE — Discharge Instructions (Addendum)
Please return immediately if develop fevers, chills, worsening chest pain, shortness of breath, passout, palpitations lightheadedness or any new or worsening symptoms that are concerning to you.

## 2023-03-31 NOTE — ED Notes (Signed)
Patient has a friend coming to get him to transport him to the ER.

## 2023-03-31 NOTE — ED Triage Notes (Signed)
Pt presents with CP since Monday, sent from UC for evaluation.

## 2023-03-31 NOTE — ED Provider Notes (Signed)
[facility]    CSN: [id number] Arrival date & time: 03/31/23  4696      History   Chief Complaint No chief complaint on file.   HPI Robert Durham is a 67 y.o. male.   The history is provided by the patient.   Patient presents for complaints of bilateral chest pain.  Patient states symptoms have been present for the past week.  Currently rates pain 8/10, describes pain as "sharp."  Patient also endorses nausea and vomiting, but states that he has this "all the time".  He denies any change in his nausea and vomiting.  He further denies radiation of pain into the jaw or into his upper extremities.  Patient states pain became worse today.  Patient with significant past medical history to include diabetes, history of kidney transplant, diverticulitis, and reflux.  Patient states that he is currently on medication for reflux, Nexium, and states that "it works some time and sometimes it does not."  Past Medical History:  Diagnosis Date   Chronic back pain    Chronic hip pain    Chronic nausea    Depression    Diabetes mellitus    Diverticulitis    Gastritis    H/O kidney transplant    Hypertension    Kidney stone    Renal disorder    Urinary retention     Patient Active Problem List   Diagnosis Date Noted   Right inguinal hernia    Chest pain 03/20/2016    Past Surgical History:  Procedure Laterality Date   COLONOSCOPY  05/27/2003   EXB:[id number] terminal ileum,rectum, and colon   HERNIA REPAIR     HIP SURGERY Left    INGUINAL HERNIA REPAIR Right 03/02/2019   Procedure: HERNIA REPAIR INGUINAL ADULT WITH MESH;  Surgeon: Franky Macho, MD;  Location: [facility];  Service: General;  Laterality: Right;   KIDNEY TRANSPLANT     NEPHRECTOMY TRANSPLANTED ORGAN Left        Home Medications    Prior to Admission medications   Medication Sig Start Date End Date Taking? Authorizing Provider  esomeprazole (NEXIUM) 20 MG capsule Take 20 mg by mouth daily at 12 noon.    Yes [provider]  predniSONE (DELTASONE) 10 MG tablet Take 10 mg by mouth daily with breakfast.   Yes [provider]  atorvastatin (LIPITOR) 10 MG tablet Take 10 mg by mouth daily with breakfast.     [provider]  cycloSPORINE (SANDIMMUNE) 100 MG capsule Take 100 mg by mouth 2 (two) times daily. Take with 25 mg tablet to equal 125 mg twice daily    [provider]  cycloSPORINE (SANDIMMUNE) 25 MG capsule Take 25 mg by mouth 2 (two) times daily. Take with 100 mg tablet to equal dose of 125 mg twice daily    [provider]  ondansetron (ZOFRAN-ODT) 4 MG disintegrating tablet Take 4 mg by mouth every 4 (four) hours as needed for nausea or vomiting. Patient not taking: Reported on 08/17/2022 05/07/22   [provider]  promethazine (PHENERGAN) 25 MG tablet Take 25 mg by mouth every 6 (six) hours as needed for nausea or vomiting.    [provider]  tamsulosin (FLOMAX) 0.4 MG CAPS capsule Take 0.4 mg by mouth daily after supper. 07/15/15   [provider]  tiZANidine (ZANAFLEX) 2 MG tablet Take 1 tablet (2 mg total) by mouth every 8 (eight) hours as needed for muscle spasms. 12/22/22   Kiowa Hollar-Warren,  Sadie Haber, NP  vitamin B-12 (CYANOCOBALAMIN) 1000 MCG tablet Take 1,000 mcg by mouth daily.    [provider]  vitamin C (ASCORBIC ACID) 500 MG tablet Take 500 mg by mouth daily. 12/23/14   [provider]    Family History Family History  Problem Relation Age of Onset   Hypertension Sister     Social History Social History   Tobacco Use   Smoking status: Former    Current packs/day: 0.00    Types: Cigarettes    Quit date: 06/15/1967    Years since quitting: 55.8   Smokeless tobacco: Never  Vaping Use   Vaping status: Never Used  Substance Use Topics   Alcohol use: No   Drug use: No     Allergies   Codeine, Enalapril maleate, Nsaids, Tape, Tolmetin, and Vasotec   Review of Systems Review of  Systems Per HPI  Physical Exam Triage Vital Signs ED Triage Vitals  Encounter Vitals Group     BP 03/31/23 0940 123/82     Systolic BP Percentile --      Diastolic BP Percentile --      Pulse Rate 03/31/23 0940 76     Resp 03/31/23 0940 (!) 30     Temp 03/31/23 0940 97.9 F (36.6 C)     Temp Source 03/31/23 0940 Oral     SpO2 03/31/23 0940 100 %     Weight --      Height --      Head Circumference --      Peak Flow --      Pain Score 03/31/23 0936 8     Pain Loc --      Pain Education --      Exclude from Growth Chart --    No data found.  Updated Vital Signs BP 123/82 (BP Location: Right Arm)   Pulse 76   Temp 97.9 F (36.6 C) (Oral)   Resp (!) 24   SpO2 100%   Visual Acuity Right Eye Distance:   Left Eye Distance:   Bilateral Distance:    Right Eye Near:   Left Eye Near:    Bilateral Near:     Physical Exam Vitals and nursing note reviewed.  Constitutional:      General: He is not in acute distress.    Appearance: Normal appearance.  HENT:     Head: Normocephalic.  Eyes:     Extraocular Movements: Extraocular movements intact.     Pupils: Pupils are equal, round, and reactive to light.  Cardiovascular:     Rate and Rhythm: Normal rate and regular rhythm.     Pulses: Normal pulses.     Heart sounds: Normal heart sounds.  Pulmonary:     Effort: Tachypnea present. No respiratory distress.     Breath sounds: Normal breath sounds. No stridor. No wheezing, rhonchi or rales.  Abdominal:     General: Bowel sounds are normal.     Palpations: Abdomen is soft.     Tenderness: There is no abdominal tenderness.  Musculoskeletal:     Cervical back: Normal range of motion.  Lymphadenopathy:     Cervical: No cervical adenopathy.  Skin:    General: Skin is warm and dry.  Neurological:     General: No focal deficit present.     Mental Status: He is alert and oriented to person, place, and time.  Psychiatric:        Mood and Affect: Mood normal.  Behavior: Behavior normal.      UC Treatments / Results  Labs (all labs ordered are listed, but only abnormal results are displayed) Labs Reviewed - No data to display  EKG: Sinus rhythm, no STEMI.  Compared to EKGs performed on 08/03/2020 12/06/2016, and 06/09/2012.   Radiology No results found.  Procedures Procedures (including critical care time)  Medications Ordered in UC Medications  alum & mag hydroxide-simeth (MAALOX/MYLANTA) 200-200-20 MG/5ML suspension 30 mL (30 mLs Oral Given 03/31/23 0949)  lidocaine (XYLOCAINE) 2 % viscous mouth solution 15 mL (15 mLs Mouth/Throat Given 03/31/23 0949)    Initial Impression / Assessment and Plan / UC Course  I have reviewed the triage vital signs and the nursing notes.  Pertinent labs & imaging results that were available during my care of the patient were reviewed by me and considered in my medical decision making (see chart for details).  Patient continues to be tachypneic, vital signs are otherwise stable, room air sat at 100%.  Attempted GI cocktail with minimal relief of his symptoms.  Given the patient's current symptoms, and past medical history, recommend further evaluation in the emergency department.  Patient is in agreement with this plan of care.  He did drive, but he has contacted someone to pick him up and take him to the emergency department.  Patient is in agreement with this plan of care and verbalizes understanding.  Patient discharged to the emergency department.  Vital signs are stable at the time of discharge.   Final Clinical Impressions(s) / UC Diagnoses   Final diagnoses:  Chest pain due to myocardial ischemia, unspecified ischemic chest pain type  Tachypnea     Discharge Instructions      Go to the emergency department for further evaluation.     ED Prescriptions   None    PDMP not reviewed this encounter.   Abran Cantor, NP 03/31/23 1057

## 2023-03-31 NOTE — ED Triage Notes (Signed)
Chest pain since Monday and feels SOB.  States has had nausea and vomiting that has become worse when chest pain started.

## 2023-03-31 NOTE — ED Provider Notes (Signed)
[place] EMERGENCY DEPARTMENT AT [facility] Provider Note   CSN: [id number] Arrival date & time: 03/31/23  1048     History  Chief Complaint  Patient presents with   Chest Pain    Robert Durham is a 67 y.o. male.  This is a 67 year old male present emergency department for chest pain.  Notes he has had rhinorrhea and cough.  Has had intermittent chest pain for the past week.  No specific triggers.  Does have some associated shortness of breath with the chest pain.  Currently chest pain-free.  Reports history of stress test long time ago, does not follow with cardiology.   Chest Pain      Home Medications Prior to Admission medications   Medication Sig Start Date End Date Taking? Authorizing Provider  atorvastatin (LIPITOR) 10 MG tablet Take 10 mg by mouth daily with breakfast.     [provider]  cycloSPORINE (SANDIMMUNE) 100 MG capsule Take 100 mg by mouth 2 (two) times daily. Take with 25 mg tablet to equal 125 mg twice daily    [provider]  cycloSPORINE (SANDIMMUNE) 25 MG capsule Take 25 mg by mouth 2 (two) times daily. Take with 100 mg tablet to equal dose of 125 mg twice daily    [provider]  esomeprazole (NEXIUM) 20 MG capsule Take 20 mg by mouth daily at 12 noon.    [provider]  ondansetron (ZOFRAN-ODT) 4 MG disintegrating tablet Take 4 mg by mouth every 4 (four) hours as needed for nausea or vomiting. Patient not taking: Reported on 08/17/2022 05/07/22   [provider]  predniSONE (DELTASONE) 10 MG tablet Take 10 mg by mouth daily with breakfast.    [provider]  promethazine (PHENERGAN) 25 MG tablet Take 25 mg by mouth every 6 (six) hours as needed for nausea or vomiting.    [provider]  tamsulosin (FLOMAX) 0.4 MG CAPS capsule Take 0.4 mg by mouth daily after supper. 07/15/15   [provider]  tiZANidine (ZANAFLEX) 2 MG tablet Take 1 tablet (2 mg total) by mouth  every 8 (eight) hours as needed for muscle spasms. 12/22/22   Leath-Warren, Sadie Haber, NP  vitamin B-12 (CYANOCOBALAMIN) 1000 MCG tablet Take 1,000 mcg by mouth daily.    [provider]  vitamin C (ASCORBIC ACID) 500 MG tablet Take 500 mg by mouth daily. 12/23/14   [provider]      Allergies    Codeine, Enalapril maleate, Nsaids, Tape, Tolmetin, and Vasotec    Review of Systems   Review of Systems  Cardiovascular:  Positive for chest pain.    Physical Exam Updated Vital Signs BP 112/78   Pulse 70   Temp 98 F (36.7 C)   Resp 12   Ht 5\' 11"  (1.803 m)   Wt 84 kg   SpO2 100%   BMI 25.82 kg/m  Physical Exam Vitals and nursing note reviewed.  Constitutional:      General: He is not in acute distress.    Appearance: He is not toxic-appearing.  HENT:     Head: Normocephalic.  Cardiovascular:     Rate and Rhythm: Normal rate and regular rhythm.     Heart sounds: Normal heart sounds.  Pulmonary:     Effort: Pulmonary effort is normal.     Breath sounds: Normal breath sounds.  Musculoskeletal:     Right lower leg: No edema.     Left lower leg: No edema.  Skin:    General: Skin is warm.  Neurological:     General: No focal deficit present.     Mental Status: He is alert.     ED Results / Procedures / Treatments   Labs (all labs ordered are listed, but only abnormal results are displayed) Labs Reviewed  BASIC METABOLIC PANEL - Abnormal; Notable for the following components:      Result Value   Potassium 3.4 (*)    All other components within normal limits  CBC - Abnormal; Notable for the following components:   RBC 6.06 (*)    MCV 71.1 (*)    MCH 22.6 (*)    RDW 17.7 (*)    All other components within normal limits  RESP PANEL BY RT-PCR (RSV, FLU A&B, COVID)  RVPGX2  TROPONIN I (HIGH SENSITIVITY)  TROPONIN I (HIGH SENSITIVITY)    EKG EKG Interpretation Date/Time:  Thursday March 31 2023 11:15:45 EDT Ventricular Rate:  76 PR  Interval:  156 QRS Duration:  78 QT Interval:  376 QTC Calculation: 423 R Axis:   28  Text Interpretation: Normal sinus rhythm Normal ECG When compared with ECG of 31-Mar-2023 09:40, No significant change was found Confirmed by Estanislado Pandy [phone]) on 03/31/2023 1:30:03 PM  Radiology DG Chest 2 View  Result Date: 03/31/2023 CLINICAL DATA:  Chest pain. EXAM: CHEST - 2 VIEW COMPARISON:  May 25, 2020. FINDINGS: The heart size and mediastinal contours are within normal limits. Both lungs are clear. The visualized skeletal structures are unremarkable. IMPRESSION: No active cardiopulmonary disease. Electronically Signed   By: Lupita Raider M.D.   On: 03/31/2023 13:47    Procedures Procedures    Medications Ordered in ED Medications - No data to display  ED Course/ Medical Decision Making/ A&P                                 Medical Decision Making 67 year old male present emergency department for evaluation of chest pain.  Afebrile vital signs reassuring.  Labs largely reassuring no fever tachycardia leukocytosis to suggest systemic infection.  Basic metabolic no significant metabolic derangement.  Normal kidney function.  With concern for viral etiology given his congestion cough ordered flu/COVID was negative.  Patient's troponin negative x 2 EKG without ST segment changes indicate ischemia.  He does have some risk factors including hypertension, hyperlipidemia, diabetes.  Low risk for PE based on Wells criteria.  Chest x-ray independently viewed no overt pneumonia, pneumothorax or widened mediastinum.  Shared decision making with patient regarding admission for observation/cardiac workup versus outpatient follow-up.  Patient states that he would like to follow-up outpatient with cardiology.  Stable for discharge at this time  Amount and/or Complexity of Data Reviewed Labs: ordered. Radiology: ordered.          Final Clinical Impression(s) / ED Diagnoses Final diagnoses:   Chest pain, unspecified type    Rx / DC Orders ED Discharge Orders          Ordered    Ambulatory referral to Cardiology       Comments: If you have not heard from the Cardiology office within the next 72 hours please call [phone].   03/31/23 1516              Coral Spikes, DO 03/31/23 1531

## 2023-04-04 NOTE — Plan of Care (Signed)
[facility] Tonsillectomy/Adenoidectomy, Postoperative PEDS care plan entered in error.

## 2023-05-13 ENCOUNTER — Ambulatory Visit
Admission: EM | Admit: 2023-05-13 | Discharge: 2023-05-13 | Disposition: A | Discharge status: Home / Self Care | Payer: 59 | Attending: Family Medicine | Admitting: Family Medicine

## 2023-05-13 ENCOUNTER — Encounter: Payer: Self-pay | Admitting: Emergency Medicine

## 2023-05-13 DIAGNOSIS — M5432 Sciatica, left side: Secondary | ICD-10-CM | POA: Diagnosis not present

## 2023-05-13 MED ORDER — TIZANIDINE HCL 4 MG PO CAPS: 4.0000 mg | ORAL_CAPSULE | Freq: Three times a day (TID) | ORAL | 0 refills | Status: DC | PRN

## 2023-05-13 MED ORDER — DEXAMETHASONE SODIUM PHOSPHATE 10 MG/ML IJ SOLN
10.0000 mg | Freq: Once | INTRAMUSCULAR | Status: AC
  Administered 2023-05-13: 10 mg via INTRAMUSCULAR

## 2023-05-13 NOTE — ED Triage Notes (Signed)
Lower left sided back pain with pain shooting down left leg x 1 week.

## 2023-05-13 NOTE — Discharge Instructions (Addendum)
We have given you a steroid injection today for pain and inflammation.  I have sent a muscle relaxer to the pharmacy for as needed use.  You may use heat, massage, stretches, rest.  Follow-up for worsening symptoms at any time.

## 2023-05-13 NOTE — ED Provider Notes (Signed)
[facility]    CSN: [id number] Arrival date & time: 05/13/23  1411      History   Chief Complaint No chief complaint on file.   HPI Robert Durham is a 67 y.o. male.   Patient presenting today with 1 week history of left lower back pain shooting pains down the left leg with movement.  Denies injury to the area, fevers, chills, urinary symptoms, leg weakness numbness or tingling.  So far trying over-the-counter remedies with minimal relief.  Past medical history significant for chronic back pain, history of hip replacement on the left side, history of left kidney transplant.    Past Medical History:  Diagnosis Date   Chronic back pain    Chronic hip pain    Chronic nausea    Depression    Diabetes mellitus    Diverticulitis    Gastritis    H/O kidney transplant    Hypertension    Kidney stone    Renal disorder    Urinary retention     Patient Active Problem List   Diagnosis Date Noted   Right inguinal hernia    Chest pain 03/20/2016    Past Surgical History:  Procedure Laterality Date   COLONOSCOPY  05/27/2003   GEX:[id number] terminal ileum,rectum, and colon   HERNIA REPAIR     HIP SURGERY Left    INGUINAL HERNIA REPAIR Right 03/02/2019   Procedure: HERNIA REPAIR INGUINAL ADULT WITH MESH;  Surgeon: Franky Macho, MD;  Location: [facility];  Service: General;  Laterality: Right;   KIDNEY TRANSPLANT     NEPHRECTOMY TRANSPLANTED ORGAN Left        Home Medications    Prior to Admission medications   Medication Sig Start Date End Date Taking? Authorizing Provider  tiZANidine (ZANAFLEX) 4 MG capsule Take 1 capsule (4 mg total) by mouth 3 (three) times daily as needed for muscle spasms. Do not drink alcohol or drive while taking this medication.  May cause drowsiness. 05/13/23  Yes Particia Nearing, PA-C  atorvastatin (LIPITOR) 10 MG tablet Take 10 mg by mouth daily with breakfast.     [provider]  cycloSPORINE (SANDIMMUNE) 100 MG  capsule Take 100 mg by mouth 2 (two) times daily. Take with 25 mg tablet to equal 125 mg twice daily    [provider]  cycloSPORINE (SANDIMMUNE) 25 MG capsule Take 25 mg by mouth 2 (two) times daily. Take with 100 mg tablet to equal dose of 125 mg twice daily    [provider]  esomeprazole (NEXIUM) 20 MG capsule Take 20 mg by mouth daily at 12 noon.    [provider]  ondansetron (ZOFRAN-ODT) 4 MG disintegrating tablet Take 4 mg by mouth every 4 (four) hours as needed for nausea or vomiting. Patient not taking: Reported on 08/17/2022 05/07/22   [provider]  predniSONE (DELTASONE) 10 MG tablet Take 10 mg by mouth daily with breakfast.    [provider]  promethazine (PHENERGAN) 25 MG tablet Take 25 mg by mouth every 6 (six) hours as needed for nausea or vomiting.    [provider]  tamsulosin (FLOMAX) 0.4 MG CAPS capsule Take 0.4 mg by mouth daily after supper. 07/15/15   [provider]  vitamin B-12 (CYANOCOBALAMIN) 1000 MCG tablet Take 1,000 mcg by mouth daily.    [provider]  vitamin C (ASCORBIC ACID) 500 MG tablet Take 500 mg by mouth daily. 12/23/14   [provider]  Family History Family History  Problem Relation Age of Onset   Hypertension Sister     Social History Social History   Tobacco Use   Smoking status: Former    Current packs/day: 0.00    Types: Cigarettes    Quit date: 06/15/1967    Years since quitting: 55.9   Smokeless tobacco: Never  Vaping Use   Vaping status: Never Used  Substance Use Topics   Alcohol use: No   Drug use: No     Allergies   Codeine, Enalapril maleate, Nsaids, Tape, Tolmetin, and Vasotec   Review of Systems Review of Systems Per HPI  Physical Exam Triage Vital Signs ED Triage Vitals  Encounter Vitals Group     BP 05/13/23 1438 118/78     Systolic BP Percentile --      Diastolic BP Percentile --      Pulse Rate 05/13/23 1438 82     Resp  05/13/23 1438 18     Temp 05/13/23 1438 97.8 F (36.6 C)     Temp Source 05/13/23 1438 Oral     SpO2 05/13/23 1438 100 %     Weight --      Height --      Head Circumference --      Peak Flow --      Pain Score 05/13/23 1439 9     Pain Loc --      Pain Education --      Exclude from Growth Chart --    No data found.  Updated Vital Signs BP 118/78 (BP Location: Right Arm)   Pulse 82   Temp 97.8 F (36.6 C) (Oral)   Resp 18   SpO2 100%   Visual Acuity Right Eye Distance:   Left Eye Distance:   Bilateral Distance:    Right Eye Near:   Left Eye Near:    Bilateral Near:     Physical Exam Vitals and nursing note reviewed.  Constitutional:      Appearance: Normal appearance.  HENT:     Head: Atraumatic.  Eyes:     Extraocular Movements: Extraocular movements intact.     Conjunctiva/sclera: Conjunctivae normal.  Cardiovascular:     Rate and Rhythm: Normal rate and regular rhythm.  Pulmonary:     Effort: Pulmonary effort is normal.     Breath sounds: Normal breath sounds.  Musculoskeletal:        General: Normal range of motion.     Cervical back: Normal range of motion and neck supple.     Comments: No midline spinal tenderness to palpation diffusely.  Negative straight leg raise bilaterally.  Tender to palpation to the left lateral lumbar musculature  Skin:    General: Skin is warm and dry.  Neurological:     General: No focal deficit present.     Mental Status: He is oriented to person, place, and time.     Motor: No weakness.     Gait: Gait normal.  Psychiatric:        Mood and Affect: Mood normal.        Thought Content: Thought content normal.        Judgment: Judgment normal.      UC Treatments / Results  Labs (all labs ordered are listed, but only abnormal results are displayed) Labs Reviewed - No data to display  EKG   Radiology No results found.  Procedures Procedures (including critical care time)  Medications Ordered in  UC Medications  dexamethasone (DECADRON) injection 10 mg (10 mg Intramuscular Given 05/13/23 1541)    Initial Impression / Assessment and Plan / UC Course  I have reviewed the triage vital signs and the nursing notes.  Pertinent labs & imaging results that were available during my care of the patient were reviewed by me and considered in my medical decision making (see chart for details).     Consistent with left-sided sciatica.  Treat with IM Decadron, Zanaflex, heat, soft, stretches.  PCP follow-up recommended.  Return for worsening symptoms.  Final Clinical Impressions(s) / UC Diagnoses   Final diagnoses:  Left sided sciatica     Discharge Instructions      We have given you a steroid injection today for pain and inflammation.  I have sent a muscle relaxer to the pharmacy for as needed use.  You may use heat, massage, stretches, rest.  Follow-up for worsening symptoms at any time.    ED Prescriptions     Medication Sig Dispense Auth. Provider   tiZANidine (ZANAFLEX) 4 MG capsule Take 1 capsule (4 mg total) by mouth 3 (three) times daily as needed for muscle spasms. Do not drink alcohol or drive while taking this medication.  May cause drowsiness. 15 capsule Particia Nearing, [place]      PDMP not reviewed this encounter.   Particia Nearing, [place] 05/13/23 1826

## 2023-05-31 DIAGNOSIS — E7800 Pure hypercholesterolemia, unspecified: Secondary | ICD-10-CM | POA: Diagnosis not present

## 2023-05-31 DIAGNOSIS — I10 Essential (primary) hypertension: Secondary | ICD-10-CM | POA: Diagnosis not present

## 2023-05-31 DIAGNOSIS — M150 Primary generalized (osteo)arthritis: Secondary | ICD-10-CM | POA: Diagnosis not present

## 2023-05-31 DIAGNOSIS — N182 Chronic kidney disease, stage 2 (mild): Secondary | ICD-10-CM | POA: Diagnosis not present

## 2023-05-31 DIAGNOSIS — I129 Hypertensive chronic kidney disease with stage 1 through stage 4 chronic kidney disease, or unspecified chronic kidney disease: Secondary | ICD-10-CM | POA: Diagnosis not present

## 2023-07-18 ENCOUNTER — Ambulatory Visit: Payer: 59 | Attending: Cardiology | Admitting: Cardiology

## 2023-07-18 NOTE — Progress Notes (Deleted)
 Clinical Summary [name] is a 68 y.o.male seen today as a new patient  1.Chest pain - ER visit 03/2023 with chest pain - also URI symptoms at the time with runny nose, cough - tropsneg x 2. EKG SR no acute ischemic changes. CXR no acute findings.   Past Medical History:  Diagnosis Date   Chronic back pain    Chronic hip pain    Chronic nausea    Depression    Diabetes mellitus    Diverticulitis    Gastritis    H/O kidney transplant    Hypertension    Kidney stone    Renal disorder    Urinary retention      Allergies  Allergen Reactions   Codeine Itching, Nausea And Vomiting and Other (See Comments)   Enalapril Maleate Other (See Comments)   Nsaids Other (See Comments)    Stomach pain   Tape Other (See Comments)    Tears skin Paper tape preferred   Tolmetin Other (See Comments)    Stomach pain   Vasotec Itching and Nausea And Vomiting     Current Outpatient Medications  Medication Sig Dispense Refill   atorvastatin (LIPITOR) 10 MG tablet Take 10 mg by mouth daily with breakfast.      cycloSPORINE (SANDIMMUNE) 100 MG capsule Take 100 mg by mouth 2 (two) times daily. Take with 25 mg tablet to equal 125 mg twice daily     cycloSPORINE (SANDIMMUNE) 25 MG capsule Take 25 mg by mouth 2 (two) times daily. Take with 100 mg tablet to equal dose of 125 mg twice daily     esomeprazole (NEXIUM) 20 MG capsule Take 20 mg by mouth daily at 12 noon.     ondansetron (ZOFRAN-ODT) 4 MG disintegrating tablet Take 4 mg by mouth every 4 (four) hours as needed for nausea or vomiting. (Patient not taking: Reported on 08/17/2022)     predniSONE (DELTASONE) 10 MG tablet Take 10 mg by mouth daily with breakfast.     promethazine (PHENERGAN) 25 MG tablet Take 25 mg by mouth every 6 (six) hours as needed for nausea or vomiting.     tamsulosin (FLOMAX) 0.4 MG CAPS capsule Take 0.4 mg by mouth daily after supper.     tiZANidine (ZANAFLEX) 4 MG capsule Take 1 capsule (4 mg total) by mouth  3 (three) times daily as needed for muscle spasms. Do not drink alcohol or drive while taking this medication.  May cause drowsiness. 15 capsule 0   vitamin B-12 (CYANOCOBALAMIN) 1000 MCG tablet Take 1,000 mcg by mouth daily.     vitamin C (ASCORBIC ACID) 500 MG tablet Take 500 mg by mouth daily.     No current facility-administered medications for this visit.     Past Surgical History:  Procedure Laterality Date   COLONOSCOPY  05/27/2003   YQM:VHQION terminal ileum,rectum, and colon   HERNIA REPAIR     HIP SURGERY Left    INGUINAL HERNIA REPAIR Right 03/02/2019   Procedure: HERNIA REPAIR INGUINAL ADULT WITH MESH;  Surgeon: Franky Macho, MD;  Location: [facility];  Service: General;  Laterality: Right;   KIDNEY TRANSPLANT     NEPHRECTOMY TRANSPLANTED ORGAN Left      Allergies  Allergen Reactions   Codeine Itching, Nausea And Vomiting and Other (See Comments)   Enalapril Maleate Other (See Comments)   Nsaids Other (See Comments)    Stomach pain   Tape Other (See Comments)    Tears skin Paper  tape preferred   Tolmetin Other (See Comments)    Stomach pain   Vasotec Itching and Nausea And Vomiting      Family History  Problem Relation Age of Onset   Hypertension Sister      Social History Robert Durham reports that he quit smoking about 56 years ago. He has never used smokeless tobacco. Robert Durham reports no history of alcohol use.   Review of Systems CONSTITUTIONAL: No weight loss, fever, chills, weakness or fatigue.  HEENT: Eyes: No visual loss, blurred vision, double vision or yellow sclerae.No hearing loss, sneezing, congestion, runny nose or sore throat.  SKIN: No rash or itching.  CARDIOVASCULAR:  RESPIRATORY: No shortness of breath, cough or sputum.  GASTROINTESTINAL: No anorexia, nausea, vomiting or diarrhea. No abdominal pain or blood.  GENITOURINARY: No burning on urination, no polyuria NEUROLOGICAL: No headache, dizziness, syncope, paralysis, ataxia, numbness  or tingling in the extremities. No change in bowel or bladder control.  MUSCULOSKELETAL: No muscle, back pain, joint pain or stiffness.  LYMPHATICS: No enlarged nodes. No history of splenectomy.  PSYCHIATRIC: No history of depression or anxiety.  ENDOCRINOLOGIC: No reports of sweating, cold or heat intolerance. No polyuria or polydipsia.  Marland Kitchen   Physical Examination There were no vitals filed for this visit. There were no vitals filed for this visit.  Gen: resting comfortably, no acute distress HEENT: no scleral icterus, pupils equal round and reactive, no palptable cervical adenopathy,  CV Resp: Clear to auscultation bilaterally GI: abdomen is soft, non-tender, non-distended, normal bowel sounds, no hepatosplenomegaly MSK: extremities are warm, no edema.  Skin: warm, no rash Neuro:  no focal deficits Psych: appropriate affect   Diagnostic Studies     Assessment and Plan        Robert Durham, M.D., F.A.C.C.

## 2023-07-26 ENCOUNTER — Encounter: Payer: Self-pay | Admitting: Cardiology

## 2023-07-30 DIAGNOSIS — I10 Essential (primary) hypertension: Secondary | ICD-10-CM | POA: Diagnosis not present

## 2023-07-31 ENCOUNTER — Encounter (HOSPITAL_COMMUNITY): Payer: Self-pay | Admitting: *Deleted

## 2023-07-31 ENCOUNTER — Other Ambulatory Visit: Payer: Self-pay

## 2023-07-31 ENCOUNTER — Emergency Department (HOSPITAL_COMMUNITY)
Admission: EM | Admit: 2023-07-31 | Discharge: 2023-07-31 | Disposition: A | Discharge status: Home / Self Care | Payer: 59 | Attending: Emergency Medicine | Admitting: Emergency Medicine

## 2023-07-31 ENCOUNTER — Ambulatory Visit: Admission: EM | Admit: 2023-07-31 | Discharge: 2023-07-31 | Payer: 59

## 2023-07-31 ENCOUNTER — Emergency Department (HOSPITAL_COMMUNITY): Payer: 59

## 2023-07-31 DIAGNOSIS — M25561 Pain in right knee: Secondary | ICD-10-CM | POA: Diagnosis not present

## 2023-07-31 DIAGNOSIS — E119 Type 2 diabetes mellitus without complications: Secondary | ICD-10-CM | POA: Diagnosis not present

## 2023-07-31 DIAGNOSIS — S60412A Abrasion of right middle finger, initial encounter: Secondary | ICD-10-CM | POA: Diagnosis not present

## 2023-07-31 DIAGNOSIS — M25511 Pain in right shoulder: Secondary | ICD-10-CM | POA: Insufficient documentation

## 2023-07-31 DIAGNOSIS — M1711 Unilateral primary osteoarthritis, right knee: Secondary | ICD-10-CM | POA: Diagnosis not present

## 2023-07-31 DIAGNOSIS — S6991XA Unspecified injury of right wrist, hand and finger(s), initial encounter: Secondary | ICD-10-CM | POA: Diagnosis present

## 2023-07-31 DIAGNOSIS — W010XXA Fall on same level from slipping, tripping and stumbling without subsequent striking against object, initial encounter: Secondary | ICD-10-CM | POA: Insufficient documentation

## 2023-07-31 DIAGNOSIS — S60414A Abrasion of right ring finger, initial encounter: Secondary | ICD-10-CM | POA: Diagnosis not present

## 2023-07-31 DIAGNOSIS — W19XXXA Unspecified fall, initial encounter: Secondary | ICD-10-CM

## 2023-07-31 DIAGNOSIS — I10 Essential (primary) hypertension: Secondary | ICD-10-CM | POA: Diagnosis not present

## 2023-07-31 DIAGNOSIS — M25461 Effusion, right knee: Secondary | ICD-10-CM | POA: Diagnosis not present

## 2023-07-31 NOTE — ED Provider Notes (Signed)
[place] EMERGENCY DEPARTMENT AT [facility] Provider Note   CSN: [id number] Arrival date & time: 07/31/23  1406     History  Chief Complaint  Patient presents with   Robert Durham is a 68 y.o. male with medical history of chronic back pain, chronic hip pain, chronic nausea, depression, diabetes, hypertension, urinary retention.  Patient presents to ED for evaluation of fall.  Reports that on Friday he was out rabbit hunting in the woods with his friends when he tripped over a rock landing on his right side.  Reports he has right-sided shoulder pain, right-sided knee pain.  Also complaining of some superficial abrasions to his third and fourth digits of his right hand.  Denies blood thinners, denies hitting his head during this event.  Denies any preceding chest pain or shortness of breath prior to the fall.  States he is been taking Tylenol at home and utilizing a knee brace for knee pain.   Fall       Home Medications Prior to Admission medications   Medication Sig Start Date End Date Taking? Authorizing Provider  atorvastatin (LIPITOR) 10 MG tablet Take 10 mg by mouth daily with breakfast.     [provider]  cycloSPORINE (SANDIMMUNE) 100 MG capsule Take 100 mg by mouth 2 (two) times daily. Take with 25 mg tablet to equal 125 mg twice daily    [provider]  cycloSPORINE (SANDIMMUNE) 25 MG capsule Take 25 mg by mouth 2 (two) times daily. Take with 100 mg tablet to equal dose of 125 mg twice daily    [provider]  esomeprazole (NEXIUM) 20 MG capsule Take 20 mg by mouth daily at 12 noon.    [provider]  ondansetron (ZOFRAN-ODT) 4 MG disintegrating tablet Take 4 mg by mouth every 4 (four) hours as needed for nausea or vomiting. Patient not taking: Reported on 08/17/2022 05/07/22   [provider]  predniSONE (DELTASONE) 10 MG tablet Take 10 mg by mouth daily with breakfast.    [provider]   promethazine (PHENERGAN) 25 MG tablet Take 25 mg by mouth every 6 (six) hours as needed for nausea or vomiting.    [provider]  tamsulosin (FLOMAX) 0.4 MG CAPS capsule Take 0.4 mg by mouth daily after supper. 07/15/15   [provider]  tiZANidine (ZANAFLEX) 4 MG capsule Take 1 capsule (4 mg total) by mouth 3 (three) times daily as needed for muscle spasms. Do not drink alcohol or drive while taking this medication.  May cause drowsiness. 05/13/23   Particia Nearing, PA-C  vitamin B-12 (CYANOCOBALAMIN) 1000 MCG tablet Take 1,000 mcg by mouth daily.    [provider]  vitamin C (ASCORBIC ACID) 500 MG tablet Take 500 mg by mouth daily. 12/23/14   [provider]      Allergies    Codeine, Enalapril maleate, Nsaids, Tape, Tolmetin, and Vasotec    Review of Systems   Review of Systems  Musculoskeletal:  Positive for arthralgias.  All other systems reviewed and are negative.   Physical Exam Updated Vital Signs BP 117/85   Pulse 70   Temp 98.4 F (36.9 C) (Oral)   Resp 16   Ht 5\' 11"  (1.803 m)   Wt 83.9 kg   SpO2 98%   BMI 25.80 kg/m  Physical Exam Vitals and nursing note reviewed.  Constitutional:      General: He is not in acute distress.  Appearance: He is well-developed.  HENT:     Head: Normocephalic and atraumatic.  Eyes:     Conjunctiva/sclera: Conjunctivae normal.  Cardiovascular:     Rate and Rhythm: Normal rate and regular rhythm.     Heart sounds: No murmur heard. Pulmonary:     Effort: Pulmonary effort is normal. No respiratory distress.     Breath sounds: Normal breath sounds.  Abdominal:     Palpations: Abdomen is soft.     Tenderness: There is no abdominal tenderness.  Musculoskeletal:        General: No swelling.     Cervical back: Neck supple.     Comments: No deformity to right shoulder.  Full range of motion of right shoulder.  Nonfocal tenderness.  Patient also has nonfocal pain to his right knee, no  deformity noted.  There is slight soft tissue swelling.  He has slightly reduced range of motion secondary to swelling.  Skin:    General: Skin is warm and dry.     Capillary Refill: Capillary refill takes less than 2 seconds.     Comments: 2 superficial abrasions to patient third, fourth digits of right hand.  Neurological:     Mental Status: He is alert.  Psychiatric:        Mood and Affect: Mood normal.     ED Results / Procedures / Treatments   Labs (all labs ordered are listed, but only abnormal results are displayed) Labs Reviewed - No data to display  EKG None  Radiology DG Shoulder Right Result Date: 07/31/2023 CLINICAL DATA:  Fall with shoulder pain. EXAM: RIGHT SHOULDER - 2+ VIEW COMPARISON:  Chest radiograph dated 03/31/2023. FINDINGS: There is no evidence of fracture or dislocation. There is no evidence of arthropathy or other focal bone abnormality. Soft tissues are unremarkable. IMPRESSION: Negative. Electronically Signed   By: Romona Curls M.D.   On: 07/31/2023 16:41   DG Knee Complete 4 Views Right Result Date: 07/31/2023 CLINICAL DATA:  Larey Seat.  Right knee pain. EXAM: RIGHT KNEE - COMPLETE 4+ VIEW COMPARISON:  None Available. FINDINGS: Mild/moderate tricompartmental degenerative changes with small ossified loose bodies in the posterior joint space. No acute fracture or osteochondral lesion. A moderate-sized knee joint effusion is noted. Age advanced vascular calcifications likely related to diabetes. IMPRESSION: 1. Mild/moderate tricompartmental degenerative changes with small ossified loose bodies in the posterior joint space. 2. Moderate-sized knee joint effusion. 3. No acute bony findings. Electronically Signed   By: Rudie Meyer M.D.   On: 07/31/2023 16:30    Procedures Procedures   Medications Ordered in ED Medications - No data to display  ED Course/ Medical Decision Making/ A&P  Medical Decision Making Amount and/or Complexity of Data Reviewed Radiology:  ordered.   68 year old presents for evaluation.  Please see HPI for further details.  On examination the patient is afebrile, nontachycardic.  Lung sounds clear bilaterally, not hypoxic.  Abdomen soft compressible throughout.  Neurological examination at baseline.  Patient has 2 superficial abrasions to his right third and fourth digits.  Will assess patient with x-ray imaging of right shoulder, right knee.  X-ray imaging is unremarkable.  Patient does have effusion to right knee.  Have advised him to elevate his leg at home.  He already has a knee brace.  Have encouraged him to take Tylenol for pain at home.  Will have him follow-up with his PCP this week.  He is stable to discharge home.   Final Clinical Impression(s) / ED Diagnoses Final  diagnoses:  Fall, initial encounter  Acute pain of right shoulder  Acute pain of right knee    Rx / DC Orders ED Discharge Orders     None         Al Decant, PA-C 07/31/23 1726    Loetta Rough, MD 07/31/23 2251

## 2023-07-31 NOTE — Discharge Instructions (Signed)
It was a pleasure taking part in your care.  As discussed, your workup is reassuring.  You have no fracture in your shoulder or knee.  You have a small area of fluid in your knee which is probably causing pain.  Please elevate your leg at night and this will help resolve this.  Please continue wearing brace at home.  Please continue to care for your wounds on your hand, please leave them open to air today.  Please follow-up with your PCP.  Return to ED with any new or worsening symptoms.

## 2023-07-31 NOTE — ED Triage Notes (Addendum)
Pt walking in the woods Friday and fell after slipping.  Pt with c/o right knee pain, right shoulder and abrasion to right middle and ring fingers. Denies hitting his head with fall.

## 2023-08-25 DIAGNOSIS — I10 Essential (primary) hypertension: Secondary | ICD-10-CM | POA: Diagnosis not present

## 2023-08-30 DIAGNOSIS — I129 Hypertensive chronic kidney disease with stage 1 through stage 4 chronic kidney disease, or unspecified chronic kidney disease: Secondary | ICD-10-CM | POA: Diagnosis not present

## 2023-08-30 DIAGNOSIS — I10 Essential (primary) hypertension: Secondary | ICD-10-CM | POA: Diagnosis not present

## 2023-08-30 DIAGNOSIS — N1831 Chronic kidney disease, stage 3a: Secondary | ICD-10-CM | POA: Diagnosis not present

## 2023-08-30 DIAGNOSIS — M25511 Pain in right shoulder: Secondary | ICD-10-CM | POA: Diagnosis not present

## 2023-08-30 DIAGNOSIS — E7800 Pure hypercholesterolemia, unspecified: Secondary | ICD-10-CM | POA: Diagnosis not present

## 2023-08-30 DIAGNOSIS — N182 Chronic kidney disease, stage 2 (mild): Secondary | ICD-10-CM | POA: Diagnosis not present

## 2023-09-26 DIAGNOSIS — I10 Essential (primary) hypertension: Secondary | ICD-10-CM | POA: Diagnosis not present

## 2023-10-04 DIAGNOSIS — N1831 Chronic kidney disease, stage 3a: Secondary | ICD-10-CM | POA: Diagnosis not present

## 2023-10-04 DIAGNOSIS — Z940 Kidney transplant status: Secondary | ICD-10-CM | POA: Diagnosis not present

## 2023-10-26 ENCOUNTER — Ambulatory Visit: Payer: 59 | Attending: Internal Medicine | Admitting: Internal Medicine

## 2023-10-26 ENCOUNTER — Encounter: Payer: Self-pay | Admitting: Internal Medicine

## 2023-10-26 VITALS — BP 104/62 | HR 74 | Ht 71.0 in | Wt 185.6 lb

## 2023-10-26 DIAGNOSIS — I10 Essential (primary) hypertension: Secondary | ICD-10-CM | POA: Diagnosis not present

## 2023-10-26 DIAGNOSIS — R42 Dizziness and giddiness: Secondary | ICD-10-CM | POA: Diagnosis not present

## 2023-10-26 DIAGNOSIS — R079 Chest pain, unspecified: Secondary | ICD-10-CM | POA: Diagnosis not present

## 2023-10-26 NOTE — Progress Notes (Signed)
 Cardiology Office Note  Date: 10/26/2023   ID: Tremar Duplantier Aispuro, DOB 04-06-1956, MRN [id number]  PCP:  Wilburn Handler, MD  Cardiologist:  Lasalle Pointer, MD Electrophysiologist:  None   History of Present Illness: Robert Durham is a 68 y.o. male with no PMH was referred to cardiology clinic for evaluation of chest pain.  Chest pain occurs once a month, lasting for about 1 hour at rest and not with exertion.  Dizziness occurs 2-3 times per month, at rest and with exertion.  No syncope.  No palpitations or leg swelling.  No DOE.  No family history of CAD.  No prior history of MI/PCI/CABG.  No prior ischemia evaluation.  Past Medical History:  Diagnosis Date   Chronic back pain    Chronic hip pain    Chronic nausea    Depression    Diabetes mellitus    Diverticulitis    Gastritis    H/O kidney transplant    Hypertension    Kidney stone    Renal disorder    Urinary retention     Past Surgical History:  Procedure Laterality Date   COLONOSCOPY  05/27/2003   WGN:FAOZHY terminal ileum,rectum, and colon   HERNIA REPAIR     HIP SURGERY Left    INGUINAL HERNIA REPAIR Right 03/02/2019   Procedure: HERNIA REPAIR INGUINAL ADULT WITH MESH;  Surgeon: Alanda Allegra, MD;  Location: [facility];  Service: General;  Laterality: Right;   KIDNEY TRANSPLANT     NEPHRECTOMY TRANSPLANTED ORGAN Left     Current Outpatient Medications  Medication Sig Dispense Refill   amoxicillin  (AMOXIL ) 500 MG capsule Take 500 mg by mouth 2 (two) times daily.     atorvastatin (LIPITOR) 10 MG tablet Take 10 mg by mouth daily with breakfast.      cycloSPORINE  (SANDIMMUNE ) 100 MG capsule Take 100 mg by mouth 2 (two) times daily. Take with 25 mg tablet to equal 125 mg twice daily     cycloSPORINE  (SANDIMMUNE ) 25 MG capsule Take 25 mg by mouth 2 (two) times daily. Take with 100 mg tablet to equal dose of 125 mg twice daily     esomeprazole (NEXIUM) 20 MG capsule Take 20 mg by mouth daily at 12 noon.      famotidine  (PEPCID ) 20 MG tablet Take 20 mg by mouth daily.     HYDROcodone -acetaminophen  (NORCO/VICODIN) 5-325 MG tablet Take 1 tablet by mouth daily.     predniSONE  (DELTASONE ) 5 MG tablet Take 5 mg by mouth daily.     promethazine  (PHENERGAN ) 25 MG tablet Take 25 mg by mouth every 6 (six) hours as needed for nausea or vomiting.     tamsulosin  (FLOMAX ) 0.4 MG CAPS capsule Take 0.4 mg by mouth daily after supper.     tiZANidine  (ZANAFLEX ) 4 MG capsule Take 1 capsule (4 mg total) by mouth 3 (three) times daily as needed for muscle spasms. Do not drink alcohol or drive while taking this medication.  May cause drowsiness. 15 capsule 0   vitamin B-12 (CYANOCOBALAMIN) 1000 MCG tablet Take 1,000 mcg by mouth daily.     vitamin C (ASCORBIC ACID) 500 MG tablet Take 500 mg by mouth daily.     No current facility-administered medications for this visit.   Allergies:  Codeine, Enalapril maleate, Nsaids, Tape, Tolmetin, and Vasotec   Social History: The patient  reports that he quit smoking about 56 years ago. He has never used smokeless tobacco. He reports that he does not drink  alcohol and does not use drugs.   Family History: The patient's family history includes Hypertension in his sister.   ROS:  Please see the history of present illness. Otherwise, complete review of systems is positive for none.  All other systems are reviewed and negative.   Physical Exam: VS:  BP 104/62 (BP Location: Left Arm, Patient Position: Sitting, Cuff Size: Normal)   Pulse 74   Ht 5\' 11"  (1.803 m)   Wt 185 lb 9.6 oz (84.2 kg)   SpO2 98%   BMI 25.89 kg/m , BMI Body mass index is 25.89 kg/m.  Wt Readings from Last 3 Encounters:  10/26/23 185 lb 9.6 oz (84.2 kg)  07/31/23 185 lb (83.9 kg)  03/31/23 185 lb 1.6 oz (84 kg)    General: Patient appears comfortable at rest. HEENT: Conjunctiva and lids normal, oropharynx clear with moist mucosa. Neck: Supple, no elevated JVP or carotid bruits, no thyromegaly. Lungs:  Clear to auscultation, nonlabored breathing at rest. Cardiac: Regular rate and rhythm, no S3 or significant systolic murmur, no pericardial rub. Abdomen: Soft, nontender, no hepatomegaly, bowel sounds present, no guarding or rebound. Extremities: No pitting edema, distal pulses 2+. Skin: Warm and dry. Musculoskeletal: No kyphosis. Neuropsychiatric: Alert and oriented x3, affect grossly appropriate.  Recent Labwork: 03/31/2023: BUN 15; Creatinine, Ser 1.22; Hemoglobin 13.7; Platelets 170; Potassium 3.4; Sodium 137  No results found for: "CHOL", "TRIG", "HDL", "CHOLHDL", "VLDL", "LDLCALC", "LDLDIRECT"   Assessment and Plan:  Chest pain: Chest pain occurs once a month, at rest and lasting for about 1 hour.  No cardiac risk factors except for advanced age.  No prior ischemia evaluation.  No prior history of MI/PCI/CABG.  No family history of CAD. will obtain exercise tolerance test.  Dizziness: Dizziness occurs 2-3 times per month, both with rest and exertion.  Obtain 30-day event monitor.    Medication Adjustments/Labs and Tests Ordered: Current medicines are reviewed at length with the patient today.  Concerns regarding medicines are outlined above.    Disposition:  Follow up pending results  Signed, Carole Doner Beauford Bounds, MD, 10/26/2023 11:55 AM    [facility] at [facility] [address], [place], [place] [address]

## 2023-10-26 NOTE — Patient Instructions (Signed)
 Medication Instructions:  Your physician recommends that you continue on your current medications as directed. Please refer to the Current Medication list given to you today.  *If you need a refill on your cardiac medications before your next appointment, please call your pharmacy*  Lab Work: None If you have labs (blood work) drawn today and your tests are completely normal, you will receive your results only by: MyChart Message (if you have MyChart) OR A paper copy in the mail If you have any lab test that is abnormal or we need to change your treatment, we will call you to review the results.  Testing/Procedures: Your physician has requested that you have an exercise tolerance test. For further information please visit [email or web address] Please also follow instruction sheet, as given.  Your physician has recommended that you wear an event monitor. Event monitors are medical devices that record the heart's electrical activity. Doctors most often us  these monitors to diagnose arrhythmias. Arrhythmias are problems with the speed or rhythm of the heartbeat. The monitor is a small, portable device. You can wear one while you do your normal daily activities. This is usually used to diagnose what is causing palpitations/syncope (passing out).    Follow-Up: At [facility], you and your health needs are our priority.  As part of our continuing mission to provide you with exceptional heart care, our providers are all part of one team.  This team includes your primary Cardiologist (physician) and Advanced Practice Providers or APPs (Physician Assistants and Nurse Practitioners) who all work together to provide you with the care you need, when you need it.  Your next appointment:    Follow up is pending testing results  Provider:   You may see Vishnu Mallipeddi, MD or one of the following Advanced Practice Providers on your designated Care Team:   [place], PA-C  [place]  P[place], [place] Theotis Flake, [place]     We recommend signing up for the patient portal called "MyChart".  Sign up information is provided on this After Visit Summary.  MyChart is used to connect with patients for Virtual Visits (Telemedicine).  Patients are able to view lab/test results, encounter notes, upcoming appointments, etc.  Non-urgent messages can be sent to your provider as well.   To learn more about what you can do with MyChart, go to [email or web address].   Other Instructions

## 2023-10-28 ENCOUNTER — Telehealth: Payer: Self-pay | Admitting: Internal Medicine

## 2023-10-28 NOTE — Telephone Encounter (Signed)
 Pt came into the [place] office stating he has not received his heart monitor yet   Please give pt a call

## 2023-10-28 NOTE — Telephone Encounter (Signed)
 Re-enrolled patient in [employer] for monitor. Left a message for patient with [employer] customer service number to call and inquire about shipping monitor

## 2023-11-01 ENCOUNTER — Ambulatory Visit (HOSPITAL_COMMUNITY)
Admission: RE | Admit: 2023-11-01 | Discharge: 2023-11-01 | Disposition: A | Discharge status: Home / Self Care | Source: Ambulatory Visit | Attending: Internal Medicine | Admitting: Internal Medicine

## 2023-11-01 ENCOUNTER — Other Ambulatory Visit: Payer: Self-pay

## 2023-11-01 DIAGNOSIS — R079 Chest pain, unspecified: Secondary | ICD-10-CM | POA: Diagnosis not present

## 2023-11-01 LAB — EXERCISE TOLERANCE TEST
Angina Index: 0
Estimated workload: 4.9
Exercise duration (min): 3 min
Exercise duration (sec): 40 s
MPHR: 152 {beats}/min
Peak HR: 113 {beats}/min
Percent HR: 74 %
Rest HR: 53 {beats}/min

## 2023-11-02 ENCOUNTER — Emergency Department (HOSPITAL_COMMUNITY)

## 2023-11-02 ENCOUNTER — Other Ambulatory Visit: Payer: Self-pay

## 2023-11-02 ENCOUNTER — Emergency Department (HOSPITAL_COMMUNITY)
Admission: EM | Admit: 2023-11-02 | Discharge: 2023-11-02 | Disposition: A | Discharge status: Home / Self Care | Attending: Emergency Medicine | Admitting: Emergency Medicine

## 2023-11-02 ENCOUNTER — Encounter (HOSPITAL_COMMUNITY): Payer: Self-pay

## 2023-11-02 DIAGNOSIS — Z79899 Other long term (current) drug therapy: Secondary | ICD-10-CM | POA: Insufficient documentation

## 2023-11-02 DIAGNOSIS — I10 Essential (primary) hypertension: Secondary | ICD-10-CM | POA: Diagnosis not present

## 2023-11-02 DIAGNOSIS — E119 Type 2 diabetes mellitus without complications: Secondary | ICD-10-CM | POA: Diagnosis not present

## 2023-11-02 DIAGNOSIS — Z96642 Presence of left artificial hip joint: Secondary | ICD-10-CM | POA: Insufficient documentation

## 2023-11-02 DIAGNOSIS — M25552 Pain in left hip: Secondary | ICD-10-CM | POA: Insufficient documentation

## 2023-11-02 DIAGNOSIS — M47814 Spondylosis without myelopathy or radiculopathy, thoracic region: Secondary | ICD-10-CM | POA: Diagnosis not present

## 2023-11-02 DIAGNOSIS — G8929 Other chronic pain: Secondary | ICD-10-CM | POA: Diagnosis not present

## 2023-11-02 MED ORDER — METHOCARBAMOL 500 MG PO TABS
500.0000 mg | ORAL_TABLET | Freq: Two times a day (BID) | ORAL | 0 refills | Status: AC
Start: 2023-11-02 — End: ?

## 2023-11-02 MED ORDER — HYDROCODONE-ACETAMINOPHEN 5-325 MG PO TABS
1.0000 | ORAL_TABLET | Freq: Once | ORAL | Status: AC
  Administered 2023-11-02: 1 via ORAL
  Filled 2023-11-02: qty 1

## 2023-11-02 NOTE — Discharge Instructions (Signed)
 Your x-ray is negative today for any acute injuries or complications with the hardware in your left hip.  I suspect you may have strained some muscles with your attempt to walk on the treadmill yesterday.  You may continue using your hydrocodone  which you already have, I have added Robaxin  which may offer additional relief.  I also recommend a heating pad for 20 minute increments 3 times daily.

## 2023-11-02 NOTE — ED Triage Notes (Signed)
 Pt arrived via POV c/o chronic left hip/leg pain. Pt denies recent injury. Pt reports he ambulates with a cane.

## 2023-11-02 NOTE — ED Provider Notes (Signed)
 [place] EMERGENCY DEPARTMENT AT [facility] Provider Note   CSN: [id number] Arrival date & time: 11/02/23  4098     History  Chief Complaint  Patient presents with   Hip Pain    Robert Durham is a 68 y.o. male with a history including hypertension, diabetes, chronic back pain and history of kidney transplant, also significant for a left hip arthroplasty many years ago in [place] presenting with severe pain in his left hip which radiates into his mid left anterior thigh since attempting to walk on a treadmill yesterday for a cardiac stress test.  He was unable to complete the test secondary to the pain and has been rescheduled for a nuclear medicine study next week.  Pain is worse with movement and weightbearing, better at rest.  He has had Tylenol  which has not improved his pain symptoms.  He denies weakness or numbness in the leg.  The history is provided by the patient.       Home Medications Prior to Admission medications   Medication Sig Start Date End Date Taking? Authorizing Provider  methocarbamol  (ROBAXIN ) 500 MG tablet Take 1 tablet (500 mg total) by mouth 2 (two) times daily. 11/02/23  Yes Brooklyn Jeff, Concha Deed, PA-C  amoxicillin  (AMOXIL ) 500 MG capsule Take 500 mg by mouth 2 (two) times daily. 09/27/23   [provider]  atorvastatin (LIPITOR) 10 MG tablet Take 10 mg by mouth daily with breakfast.     [provider]  cycloSPORINE  (SANDIMMUNE ) 100 MG capsule Take 100 mg by mouth 2 (two) times daily. Take with 25 mg tablet to equal 125 mg twice daily    [provider]  cycloSPORINE  (SANDIMMUNE ) 25 MG capsule Take 25 mg by mouth 2 (two) times daily. Take with 100 mg tablet to equal dose of 125 mg twice daily    [provider]  esomeprazole (NEXIUM) 20 MG capsule Take 20 mg by mouth daily at 12 noon.    [provider]  famotidine  (PEPCID ) 20 MG tablet Take 20 mg by mouth daily. 10/25/23   [provider]   HYDROcodone -acetaminophen  (NORCO/VICODIN) 5-325 MG tablet Take 1 tablet by mouth daily. 09/29/23   [provider]  predniSONE  (DELTASONE ) 5 MG tablet Take 5 mg by mouth daily. 10/20/23   [provider]  promethazine  (PHENERGAN ) 25 MG tablet Take 25 mg by mouth every 6 (six) hours as needed for nausea or vomiting.    [provider]  tamsulosin  (FLOMAX ) 0.4 MG CAPS capsule Take 0.4 mg by mouth daily after supper. 07/15/15   [provider]  vitamin B-12 (CYANOCOBALAMIN) 1000 MCG tablet Take 1,000 mcg by mouth daily.    [provider]  vitamin C (ASCORBIC ACID) 500 MG tablet Take 500 mg by mouth daily. 12/23/14   [provider]      Allergies    Codeine, Enalapril maleate, Nsaids, Tape, Tolmetin, and Vasotec    Review of Systems   Review of Systems  Constitutional:  Negative for fever.  Musculoskeletal:  Positive for arthralgias. Negative for joint swelling and myalgias.  Skin: Negative.  Negative for color change and rash.  Neurological:  Negative for weakness and numbness.  All other systems reviewed and are negative.   Physical Exam Updated Vital Signs BP 129/82 (BP Location: Right Arm)   Pulse (!) 53   Temp 97.8 F (36.6 C) (Oral)   Resp 19   Ht 5\' 11"  (1.803 m)   Wt 84.2 kg  SpO2 100%   BMI 25.89 kg/m  Physical Exam Constitutional:      Appearance: He is well-developed.  HENT:     Head: Atraumatic.  Cardiovascular:     Comments: Pulses equal bilaterally Musculoskeletal:        General: Tenderness present.     Cervical back: Normal range of motion.     Left hip: Bony tenderness present. Decreased range of motion.     Comments: Tenderness palpation from greater trochanter to anterior pelvic rim.  He has increased pain with attempts at internal and external rotation both active and passive of the left hip.  There are no palpable deformities, skin is intact without lesion or rash.  Nontender left knee.  Dorsalis pedal  pulses intact.  Skin:    General: Skin is warm and dry.  Neurological:     Mental Status: He is alert.     Sensory: No sensory deficit.     Motor: No weakness.     Deep Tendon Reflexes: Reflexes normal.     ED Results / Procedures / Treatments   Labs (all labs ordered are listed, but only abnormal results are displayed) Labs Reviewed - No data to display  EKG None  Radiology DG Hip Unilat W or Wo Pelvis 2-3 Views Left Result Date: 11/02/2023 CLINICAL DATA:  Chronic left hip pain.  No recent injury. EXAM: DG HIP (WITH OR WITHOUT PELVIS) 2-3V LEFT COMPARISON:  12/28/2021 FINDINGS: Stable left hip prosthesis. No fracture, dislocation or periprosthetic lucency. Mild-to-moderate lower thoracic spine degenerative changes. IMPRESSION: 1. No acute abnormality. 2. Stable left hip prosthesis. 3. Mild-to-moderate lower thoracic spine degenerative changes. Electronically Signed   By: Catherin Closs M.D.   On: 11/02/2023 11:35   Exercise Tolerance Test Result Date: 11/01/2023   Exercise stress test:  Clinically negative, electrically nondiagnostic for ischemia as patient failed to reach target heart rate   Pt stopped because of leg pain and SOB   Overall poor exercise tolerance   Consider other stress testing (Lexiscan) or other modality of coronary imaging    Procedures Procedures    Medications Ordered in ED Medications  HYDROcodone -acetaminophen  (NORCO/VICODIN) 5-325 MG per tablet 1 tablet (1 tablet Oral Given 11/02/23 1131)    ED Course/ Medical Decision Making/ A&P                                 Medical Decision Making Patient presenting with left hip pain after attempting an exercise treadmill test yesterday.  He has had prior total hip replacement more than 10 years ago, he does not recall his surgeon, stating [place] the [place], he would have difficulty returning to that provider therefore he is referred to our local orthopedic if his symptoms do not improve  with conservative treatment.  His exam and history suggest muscle strain and he is placed on Robaxin , we also discussed heat therapy.  He is already prescribed chronic hydrocodone .  Plan to follow-up with orthopedics if his symptoms do not resolve, suspect muscle strain.  Amount and/or Complexity of Data Reviewed Radiology: ordered.    Details: Left hip imaging reviewed, no acute findings.  Risk Prescription drug management.           Final Clinical Impression(s) / ED Diagnoses Final diagnoses:  Left hip pain    Rx / DC Orders ED Discharge Orders          Ordered  methocarbamol  (ROBAXIN ) 500 MG tablet  2 times daily        11/02/23 1155              Kamyiah Colantonio, PA-C 11/02/23 1200    Teddi Favors, DO 11/03/23 [phone]

## 2023-11-03 ENCOUNTER — Ambulatory Visit: Payer: Self-pay | Admitting: Internal Medicine

## 2023-11-03 DIAGNOSIS — R42 Dizziness and giddiness: Secondary | ICD-10-CM | POA: Diagnosis not present

## 2023-11-09 ENCOUNTER — Telehealth (HOSPITAL_COMMUNITY): Payer: Self-pay

## 2023-11-10 ENCOUNTER — Encounter (HOSPITAL_COMMUNITY)
Admission: RE | Admit: 2023-11-10 | Discharge: 2023-11-10 | Disposition: A | Discharge status: Home / Self Care | Source: Ambulatory Visit | Attending: Medical | Admitting: Medical

## 2023-11-10 ENCOUNTER — Ambulatory Visit (HOSPITAL_COMMUNITY)
Admission: RE | Admit: 2023-11-10 | Discharge: 2023-11-10 | Disposition: A | Discharge status: Home / Self Care | Source: Ambulatory Visit | Attending: Internal Medicine | Admitting: Internal Medicine

## 2023-11-10 ENCOUNTER — Encounter (HOSPITAL_COMMUNITY): Payer: Self-pay

## 2023-11-10 DIAGNOSIS — R079 Chest pain, unspecified: Secondary | ICD-10-CM | POA: Insufficient documentation

## 2023-11-10 LAB — NM MYOCAR MULTI W/SPECT W/WALL MOTION / EF
Estimated workload: 1
Exercise duration (min): 0 min
Exercise duration (sec): 0 s
LV dias vol: 84 mL (ref 62–150)
LV sys vol: 35 mL
MPHR: 152 {beats}/min
Nuc Stress EF: 58 %
Peak HR: 86 {beats}/min
Percent HR: 56 %
RATE: 0.3
Rest HR: 56 {beats}/min
Rest Nuclear Isotope Dose: 10.2 mCi
SDS: 2
SRS: 2
SSS: 4
ST Depression (mm): 0 mm
Stress Nuclear Isotope Dose: 30.5 mCi
TID: 1.1

## 2023-11-10 MED ORDER — TECHNETIUM TC 99M TETROFOSMIN IV KIT
30.0000 | PACK | Freq: Once | INTRAVENOUS | Status: AC | PRN
  Administered 2023-11-10: 30.5 via INTRAVENOUS

## 2023-11-10 MED ORDER — TECHNETIUM TC 99M TETROFOSMIN IV KIT
10.0000 | PACK | Freq: Once | INTRAVENOUS | Status: AC | PRN
  Administered 2023-11-10: 10.2 via INTRAVENOUS

## 2023-11-10 MED ORDER — SODIUM CHLORIDE FLUSH 0.9 % IV SOLN
INTRAVENOUS | Status: AC
  Filled 2023-11-10: qty 10

## 2023-11-10 MED ORDER — REGADENOSON 0.4 MG/5ML IV SOLN
INTRAVENOUS | Status: AC
  Administered 2023-11-10: 0.4 mg via INTRAVENOUS
  Filled 2023-11-10: qty 5

## 2023-11-11 ENCOUNTER — Ambulatory Visit: Attending: Internal Medicine

## 2023-11-11 ENCOUNTER — Ambulatory Visit: Payer: Self-pay | Admitting: Medical

## 2023-11-11 DIAGNOSIS — R42 Dizziness and giddiness: Secondary | ICD-10-CM

## 2023-11-25 DIAGNOSIS — I10 Essential (primary) hypertension: Secondary | ICD-10-CM | POA: Diagnosis not present

## 2023-11-29 DIAGNOSIS — E7800 Pure hypercholesterolemia, unspecified: Secondary | ICD-10-CM | POA: Diagnosis not present

## 2023-11-29 DIAGNOSIS — I129 Hypertensive chronic kidney disease with stage 1 through stage 4 chronic kidney disease, or unspecified chronic kidney disease: Secondary | ICD-10-CM | POA: Diagnosis not present

## 2023-11-29 DIAGNOSIS — M182 Bilateral post-traumatic osteoarthritis of first carpometacarpal joints: Secondary | ICD-10-CM | POA: Diagnosis not present

## 2023-11-29 DIAGNOSIS — N182 Chronic kidney disease, stage 2 (mild): Secondary | ICD-10-CM | POA: Diagnosis not present

## 2023-11-29 DIAGNOSIS — M150 Primary generalized (osteo)arthritis: Secondary | ICD-10-CM | POA: Diagnosis not present

## 2023-12-23 DIAGNOSIS — R42 Dizziness and giddiness: Secondary | ICD-10-CM | POA: Diagnosis not present

## 2023-12-26 DIAGNOSIS — I10 Essential (primary) hypertension: Secondary | ICD-10-CM | POA: Diagnosis not present

## 2024-01-26 DIAGNOSIS — I10 Essential (primary) hypertension: Secondary | ICD-10-CM | POA: Diagnosis not present

## 2024-02-07 ENCOUNTER — Other Ambulatory Visit: Payer: Self-pay

## 2024-02-07 ENCOUNTER — Emergency Department (HOSPITAL_COMMUNITY)
Admission: EM | Admit: 2024-02-07 | Discharge: 2024-02-07 | Discharge status: Against Medical Advice | Attending: Emergency Medicine | Admitting: Emergency Medicine

## 2024-02-07 ENCOUNTER — Encounter (HOSPITAL_COMMUNITY): Payer: Self-pay | Admitting: *Deleted

## 2024-02-07 DIAGNOSIS — Z5321 Procedure and treatment not carried out due to patient leaving prior to being seen by health care provider: Secondary | ICD-10-CM | POA: Diagnosis not present

## 2024-02-07 DIAGNOSIS — R109 Unspecified abdominal pain: Secondary | ICD-10-CM | POA: Insufficient documentation

## 2024-02-07 DIAGNOSIS — R110 Nausea: Secondary | ICD-10-CM | POA: Diagnosis not present

## 2024-02-07 LAB — COMPREHENSIVE METABOLIC PANEL WITH GFR
ALT: 16 U/L (ref 0–44)
AST: 25 U/L (ref 15–41)
Albumin: 3.6 g/dL (ref 3.5–5.0)
Alkaline Phosphatase: 56 U/L (ref 38–126)
Anion gap: 9 (ref 5–15)
BUN: 18 mg/dL (ref 8–23)
CO2: 23 mmol/L (ref 22–32)
Calcium: 9.2 mg/dL (ref 8.9–10.3)
Chloride: 105 mmol/L (ref 98–111)
Creatinine, Ser: 1.33 mg/dL — ABNORMAL HIGH (ref 0.61–1.24)
GFR, Estimated: 58 mL/min — ABNORMAL LOW (ref >60)
Glucose, Bld: 101 mg/dL — ABNORMAL HIGH (ref 70–99)
Potassium: 3.9 mmol/L (ref 3.5–5.1)
Sodium: 137 mmol/L (ref 135–145)
Total Bilirubin: 0.6 mg/dL (ref 0.0–1.2)
Total Protein: 7.3 g/dL (ref 6.5–8.1)

## 2024-02-07 LAB — CBC
HCT: 43.4 % (ref 39.0–52.0)
Hemoglobin: 13.6 g/dL (ref 13.0–17.0)
MCH: 22.6 pg — ABNORMAL LOW (ref 26.0–34.0)
MCHC: 31.3 g/dL (ref 30.0–36.0)
MCV: 72.2 fL — ABNORMAL LOW (ref 80.0–100.0)
Platelets: 211 K/uL (ref 150–400)
RBC: 6.01 MIL/uL — ABNORMAL HIGH (ref 4.22–5.81)
RDW: 15.9 % — ABNORMAL HIGH (ref 11.5–15.5)
WBC: 6.6 K/uL (ref 4.0–10.5)
nRBC: 0 % (ref 0.0–0.2)

## 2024-02-07 LAB — LIPASE, BLOOD: Lipase: 41 U/L (ref 11–51)

## 2024-02-07 NOTE — ED Triage Notes (Signed)
 Pt with right sided abd pain x 2 days.  + nausea, denies any emesis or diarrhea.

## 2024-02-08 ENCOUNTER — Emergency Department (HOSPITAL_COMMUNITY)

## 2024-02-08 ENCOUNTER — Other Ambulatory Visit: Payer: Self-pay

## 2024-02-08 ENCOUNTER — Emergency Department (HOSPITAL_COMMUNITY)
Admission: EM | Admit: 2024-02-08 | Discharge: 2024-02-08 | Disposition: A | Discharge status: Home / Self Care | Attending: Emergency Medicine | Admitting: Emergency Medicine

## 2024-02-08 DIAGNOSIS — E119 Type 2 diabetes mellitus without complications: Secondary | ICD-10-CM | POA: Insufficient documentation

## 2024-02-08 DIAGNOSIS — R1031 Right lower quadrant pain: Secondary | ICD-10-CM | POA: Insufficient documentation

## 2024-02-08 DIAGNOSIS — Z940 Kidney transplant status: Secondary | ICD-10-CM | POA: Diagnosis not present

## 2024-02-08 DIAGNOSIS — R109 Unspecified abdominal pain: Secondary | ICD-10-CM | POA: Diagnosis not present

## 2024-02-08 DIAGNOSIS — K5730 Diverticulosis of large intestine without perforation or abscess without bleeding: Secondary | ICD-10-CM | POA: Diagnosis not present

## 2024-02-08 DIAGNOSIS — K7689 Other specified diseases of liver: Secondary | ICD-10-CM | POA: Diagnosis not present

## 2024-02-08 LAB — URINALYSIS, ROUTINE W REFLEX MICROSCOPIC
Bilirubin Urine: NEGATIVE
Glucose, UA: NEGATIVE mg/dL
Hgb urine dipstick: NEGATIVE
Ketones, ur: NEGATIVE mg/dL
Leukocytes,Ua: NEGATIVE
Nitrite: NEGATIVE
Protein, ur: NEGATIVE mg/dL
Specific Gravity, Urine: 1.03 (ref 1.005–1.030)
pH: 6 (ref 5.0–8.0)

## 2024-02-08 MED ORDER — IOHEXOL 300 MG/ML  SOLN
100.0000 mL | Freq: Once | INTRAMUSCULAR | Status: AC | PRN
  Administered 2024-02-08: 100 mL via INTRAVENOUS

## 2024-02-08 MED ORDER — ONDANSETRON HCL 4 MG/2ML IJ SOLN
4.0000 mg | Freq: Once | INTRAMUSCULAR | Status: AC
  Administered 2024-02-08: 4 mg via INTRAVENOUS
  Filled 2024-02-08: qty 2

## 2024-02-08 MED ORDER — HYDROMORPHONE HCL 1 MG/ML IJ SOLN
1.0000 mg | Freq: Once | INTRAMUSCULAR | Status: AC
  Administered 2024-02-08: 1 mg via INTRAVENOUS
  Filled 2024-02-08: qty 1

## 2024-02-08 MED ORDER — LACTATED RINGERS IV BOLUS
1000.0000 mL | Freq: Once | INTRAVENOUS | Status: AC
  Administered 2024-02-08: 1000 mL via INTRAVENOUS

## 2024-02-08 NOTE — ED Triage Notes (Signed)
 Pt states he was here yesterday and was told to come back today, states they did lab work, etc yesterday. Here for same c/o as yesterday which is RLQ/side pain that is constant and sharp in nature. Rates pain 10/10. States this all begin Monday.denies V/D but does endorse nausea

## 2024-02-08 NOTE — ED Provider Notes (Signed)
 [place] EMERGENCY DEPARTMENT AT [facility] Provider Note   CSN: [id number] Arrival date & time: 02/08/24  9271     Patient presents with: Abdominal Pain   Robert Durham is a 68 y.o. male.   68 year old male with a history of renal transplant on cyclosporine  and prednisone , diabetes, and chronic back pain who presents to the emergency department with right-sided flank pain and right lower quadrant pain.  Started 2 days ago.  Nausea but no vomiting.  10/10.  Worsened with movement not with eating.  No fevers.  No known injuries.  States he had some hernia surgeries and a kidney transplant into his left lower quadrant but still has his appendix and gallbladder.  Came in yesterday but left due to long wait times and says that he is tried some Norco at home but with the pain being unbearable wanted to come back in for evaluation       Prior to Admission medications   Medication Sig Start Date End Date Taking? Authorizing Provider  amoxicillin  (AMOXIL ) 500 MG capsule Take 500 mg by mouth 2 (two) times daily. 09/27/23   [provider]  atorvastatin (LIPITOR) 10 MG tablet Take 10 mg by mouth daily with breakfast.     [provider]  cycloSPORINE  (SANDIMMUNE ) 100 MG capsule Take 100 mg by mouth 2 (two) times daily. Take with 25 mg tablet to equal 125 mg twice daily    [provider]  cycloSPORINE  (SANDIMMUNE ) 25 MG capsule Take 25 mg by mouth 2 (two) times daily. Take with 100 mg tablet to equal dose of 125 mg twice daily    [provider]  esomeprazole (NEXIUM) 20 MG capsule Take 20 mg by mouth daily at 12 noon.    [provider]  famotidine  (PEPCID ) 20 MG tablet Take 20 mg by mouth daily. 10/25/23   [provider]  HYDROcodone -acetaminophen  (NORCO/VICODIN) 5-325 MG tablet Take 1 tablet by mouth daily. 09/29/23   [provider]  methocarbamol  (ROBAXIN ) 500 MG tablet Take 1 tablet (500 mg total) by mouth 2 (two)  times daily. 11/02/23   Idol, Julie, PA-C  predniSONE  (DELTASONE ) 5 MG tablet Take 5 mg by mouth daily. 10/20/23   [provider]  promethazine  (PHENERGAN ) 25 MG tablet Take 25 mg by mouth every 6 (six) hours as needed for nausea or vomiting.    [provider]  tamsulosin  (FLOMAX ) 0.4 MG CAPS capsule Take 0.4 mg by mouth daily after supper. 07/15/15   [provider]  vitamin B-12 (CYANOCOBALAMIN) 1000 MCG tablet Take 1,000 mcg by mouth daily.    [provider]  vitamin C (ASCORBIC ACID) 500 MG tablet Take 500 mg by mouth daily. 12/23/14   [provider]    Allergies: Codeine, Enalapril maleate, Nsaids, Tape, Tolmetin, and Vasotec    Review of Systems  Updated Vital Signs BP (!) 151/77 (BP Location: Right Arm)   Pulse 61   Temp 97.9 F (36.6 C) (Oral)   Resp 19   SpO2 96%   Physical Exam Vitals and nursing note reviewed.  Constitutional:      General: He is not in acute distress.    Appearance: He is well-developed. He is not ill-appearing.  Abdominal:     General: There is no distension.     Palpations: Abdomen is soft. There is no mass.     Tenderness: There is abdominal tenderness (Right mid abdomen). There is right CVA tenderness. There is no left  CVA tenderness or guarding.     Comments: No tenderness to palpation over left lower quadrant where her transplanted kidney is located.  Skin:    General: Skin is warm and dry.     Comments: No rashes on right flank  Neurological:     Mental Status: He is alert. Mental status is at baseline.  Psychiatric:        Mood and Affect: Mood normal.        Behavior: Behavior normal.     (all labs ordered are listed, but only abnormal results are displayed) Labs Reviewed  URINALYSIS, ROUTINE W REFLEX MICROSCOPIC    EKG: None  Radiology: CT ABDOMEN PELVIS W CONTRAST Result Date: 02/08/2024 CLINICAL DATA:  Acute right flank pain. EXAM: CT ABDOMEN AND PELVIS WITH CONTRAST TECHNIQUE:  Multidetector CT imaging of the abdomen and pelvis was performed using the standard protocol following bolus administration of intravenous contrast. RADIATION DOSE REDUCTION: This exam was performed according to the departmental dose-optimization program which includes automated exposure control, adjustment of the mA and/or kV according to patient size and/or use of iterative reconstruction technique. CONTRAST:  OMNIPAQUE  IOHEXOL  300 MG/ML  SOLN COMPARISON:  May 07, 2022. FINDINGS: Lower chest: Minimal bilateral posterior basilar subsegmental atelectasis is noted. Hepatobiliary: No cholelithiasis or biliary dilatation is noted. Stable hepatic cysts are noted. Pancreas: Unremarkable. No pancreatic ductal dilatation or surrounding inflammatory changes. Spleen: Normal in size without focal abnormality. Adrenals/Urinary Tract: Adrenal glands appear normal. Bilateral renal atrophy is again noted consistent with history of end-stage renal disease. Renal transplant is noted in left lower quadrant. No hydronephrosis is noted. Urinary bladder is unremarkable. Stomach/Bowel: Stomach is within normal limits. Appendix appears normal. No evidence of bowel wall thickening, distention, or inflammatory changes. Diverticulosis is noted throughout the colon without inflammation. Vascular/Lymphatic: Aortic atherosclerosis. No enlarged abdominal or pelvic lymph nodes. Reproductive: Prostate is unremarkable. Other: No ascites or hernia is noted. Musculoskeletal: No acute or significant osseous findings. IMPRESSION: 1. Bilateral renal atrophy is again noted consistent with history of end-stage renal disease. Renal transplant is noted in left lower quadrant. No hydronephrosis is noted. 2. Diverticulosis is noted throughout the colon without inflammation. 3. Aortic atherosclerosis. Aortic Atherosclerosis (ICD10-I70.0). Electronically Signed   By: Lynwood Landy Raddle M.D.   On: 02/08/2024 09:21     Procedures   Medications  Ordered in the ED  HYDROmorphone  (DILAUDID ) injection 1 mg (1 mg Intravenous Given 02/08/24 0827)  ondansetron  (ZOFRAN ) injection 4 mg (4 mg Intravenous Given 02/08/24 0827)  lactated ringers  bolus 1,000 mL (0 mLs Intravenous Stopped 02/08/24 1016)  iohexol  (OMNIPAQUE ) 300 MG/ML solution 100 mL (100 mLs Intravenous Contrast Given 02/08/24 0855)                                    Medical Decision Making Amount and/or Complexity of Data Reviewed Labs: ordered. Radiology: ordered.  Risk Prescription drug management.   Montrey Buist Gittings is a 68 year old male with a history of renal transplant on cyclosporine  and prednisone , diabetes, and chronic back pain who presents to the emergency department with right-sided flank pain and right lower quadrant pain.   Initial Ddx:  Kidney stone, appendicitis, kidney transplant rejection, UTI, pyelonephritis  MDM/Course:  Patient resents emergency department with right-sided flank pain.  Left prior to being seen yesterday and had labs that did not show acute abnormality does have a history of renal transplant in his left  lower quadrant.  Not having any other symptoms at this point in time.  He is overall well-appearing.  Does have some CVA tenderness and muscular tenderness on that side in addition to right lower quadrant tenderness palpation.  He underwent a CT with contrast which showed a normal appendix.  No kidney stones were noted.  Urine appears to be clean.  Was given IV fluid bolus with a history of kidney transplant to prevent any postcontrast AKI.  Upon re-evaluation was feeling much better after pain medication.  I suspect that he likely has a muscle strain is causing his symptoms.  This patient presents to the ED for concern of complaints listed in HPI, this involves an extensive number of treatment options, and is a complaint that carries with it a high risk of complications and morbidity. Disposition including potential need for admission  considered.   Dispo: DC Home. Return precautions discussed including, but not limited to, those listed in the AVS. Allowed pt time to ask questions which were answered fully prior to dc.  Records reviewed Outpatient Clinic Notes The following labs were independently interpreted: Urinalysis and show no acute abnormality I independently reviewed the following imaging with scope of interpretation limited to determining acute life threatening conditions related to emergency care: CT Abdomen/Pelvis and agree with the radiologist interpretation with the following exceptions: none I personally reviewed and interpreted cardiac monitoring: normal sinus rhythm  I personally reviewed and interpreted the pt's EKG: see above for interpretation  I have reviewed the patients home medications and made adjustments as needed Social Determinants of health:  Geriatric  Portions of this note were generated with Scientist, clinical (histocompatibility and immunogenetics). Dictation errors may occur despite best attempts at proofreading.     Final diagnoses:  Flank pain    ED Discharge Orders     None          Yolande Lamar BROCKS, MD 02/08/24 [phone]

## 2024-02-08 NOTE — Discharge Instructions (Signed)
 You were seen for your flank pain in the emergency department.  It is likely from muscle strain.  At home, please take Tylenol  and your hydrocodone  at home for your pain.  You may use lidocaine  patches as well.    Check your MyChart online for the results of any tests that had not resulted by the time you left the emergency department.   Follow-up with your primary doctor in 2-3 days regarding your visit.    Return immediately to the emergency department if you experience any of the following: Worsening pain, fevers, vomiting, or any other concerning symptoms.    Thank you for visiting our Emergency Department. It was a pleasure taking care of you today.

## 2024-02-10 ENCOUNTER — Ambulatory Visit: Attending: Internal Medicine | Admitting: Internal Medicine

## 2024-02-10 ENCOUNTER — Encounter: Payer: Self-pay | Admitting: Internal Medicine

## 2024-02-10 VITALS — BP 102/62 | HR 72 | Ht 71.0 in | Wt 177.0 lb

## 2024-02-10 DIAGNOSIS — R42 Dizziness and giddiness: Secondary | ICD-10-CM | POA: Diagnosis not present

## 2024-02-10 DIAGNOSIS — R079 Chest pain, unspecified: Secondary | ICD-10-CM | POA: Diagnosis not present

## 2024-02-10 NOTE — Patient Instructions (Signed)
 Medication Instructions:   Your physician recommends that you continue on your current medications as directed. Please refer to the Current Medication list given to you today.   Labwork: None today  Testing/Procedures: None today  Follow-Up: As Needed  Any Other Special Instructions Will Be Listed Below (If Applicable).  If you need a refill on your cardiac medications before your next appointment, please call your pharmacy.

## 2024-02-10 NOTE — Progress Notes (Signed)
 Cardiology Office Note  Date: 02/10/2024   ID: Robert Durham, DOB 12-26-1955, MRN [id number]  PCP:  Robert Lung, MD  Cardiologist:  Robert SHAUNNA Maywood, MD Electrophysiologist:  None   History of Present Illness: Robert Durham is a 68 y.o. male with Hx of renal transplantation c/w CKD stage IIIa is here for follow-up visit.  Initially referred to cardiology clinic for evaluation of chest pain at rest but due to advanced age, he underwent ETT that was nondiagnostic and later had to undergo Lexiscan  that was normal, had no evidence of ischemia.  He was complaining of dizziness due to which event monitor was obtained that was unremarkable except for occasional episodes of NSVT.  Patient is here today for follow-up visit.  Reviewed ER notes.  He had flank pain at that time and also AKI.  IV fluids were administered.  He does not have HTN.  Soft blood pressures overall.  He takes tamsulosin  as well.  He gets dizzy 2 times per month.  Usually happens from sitting to standing position.  Past Medical History:  Diagnosis Date   Chronic back pain    Chronic hip pain    Chronic nausea    Depression    Diabetes mellitus    Diverticulitis    Gastritis    H/O kidney transplant    Hypertension    Kidney stone    Renal disorder    Urinary retention     Past Surgical History:  Procedure Laterality Date   COLONOSCOPY  05/27/2003   MFM:Wnmfjo terminal ileum,rectum, and colon   HERNIA REPAIR     HIP SURGERY Left    INGUINAL HERNIA REPAIR Right 03/02/2019   Procedure: HERNIA REPAIR INGUINAL ADULT WITH MESH;  Surgeon: Robert Anes, MD;  Location: [facility];  Service: General;  Laterality: Right;   KIDNEY TRANSPLANT     NEPHRECTOMY TRANSPLANTED ORGAN Left     Current Outpatient Medications  Medication Sig Dispense Refill   amoxicillin  (AMOXIL ) 500 MG capsule Take 500 mg by mouth 2 (two) times daily.     atorvastatin (LIPITOR) 10 MG tablet Take 10 mg by mouth daily with breakfast.       cycloSPORINE  (SANDIMMUNE ) 100 MG capsule Take 100 mg by mouth 2 (two) times daily. Take with 25 mg tablet to equal 125 mg twice daily     cycloSPORINE  (SANDIMMUNE ) 25 MG capsule Take 25 mg by mouth 2 (two) times daily. Take with 100 mg tablet to equal dose of 125 mg twice daily     esomeprazole (NEXIUM) 20 MG capsule Take 20 mg by mouth daily at 12 noon.     famotidine  (PEPCID ) 20 MG tablet Take 20 mg by mouth daily.     HYDROcodone -acetaminophen  (NORCO/VICODIN) 5-325 MG tablet Take 1 tablet by mouth daily.     methocarbamol  (ROBAXIN ) 500 MG tablet Take 1 tablet (500 mg total) by mouth 2 (two) times daily. 20 tablet 0   predniSONE  (DELTASONE ) 5 MG tablet Take 5 mg by mouth daily.     promethazine  (PHENERGAN ) 25 MG tablet Take 25 mg by mouth every 6 (six) hours as needed for nausea or vomiting.     tamsulosin  (FLOMAX ) 0.4 MG CAPS capsule Take 0.4 mg by mouth daily after supper.     vitamin B-12 (CYANOCOBALAMIN) 1000 MCG tablet Take 1,000 mcg by mouth daily.     vitamin C (ASCORBIC ACID) 500 MG tablet Take 500 mg by mouth daily.     No current facility-administered medications  for this visit.   Allergies:  Codeine, Enalapril maleate, Nsaids, Tape, Tolmetin, and Vasotec   Social History: The patient  reports that he quit smoking about 56 years ago. His smoking use included cigarettes. He has been exposed to tobacco smoke. He has never used smokeless tobacco. He reports that he does not drink alcohol and does not use drugs.   Family History: The patient's family history includes Hypertension in his sister.   ROS:  Please see the history of present illness. Otherwise, complete review of systems is positive for none.  All other systems are reviewed and negative.   Physical Exam: VS:  BP 102/62 (BP Location: Left Arm, Cuff Size: Normal)   Pulse 72   Ht 5' 11 (1.803 m)   Wt 177 lb (80.3 kg)   BMI 24.69 kg/m , BMI Body mass index is 24.69 kg/m.  Wt Readings from Last 3 Encounters:  02/10/24  177 lb (80.3 kg)  02/07/24 186 lb (84.4 kg)  11/02/23 185 lb 9.6 oz (84.2 kg)    General: Patient appears comfortable at rest. HEENT: Conjunctiva and lids normal, oropharynx clear with moist mucosa. Neck: Supple, no elevated JVP or carotid bruits, no thyromegaly. Lungs: Clear to auscultation, nonlabored breathing at rest. Cardiac: Regular rate and rhythm, no S3 or significant systolic murmur, no pericardial rub. Abdomen: Soft, nontender, no hepatomegaly, bowel sounds present, no guarding or rebound. Extremities: No pitting edema, distal pulses 2+. Skin: Warm and dry. Musculoskeletal: No kyphosis. Neuropsychiatric: Alert and oriented x3, affect grossly appropriate.  Recent Labwork: 02/07/2024: ALT 16; AST 25; BUN 18; Creatinine, Ser 1.33; Hemoglobin 13.6; Platelets 211; Potassium 3.9; Sodium 137  No results found for: CHOL, TRIG, HDL, CHOLHDL, VLDL, LDLCALC, LDLDIRECT   Assessment and Plan:  Chest pain: Noncardiac.  Resolved.  ETT nondiagnostic.  Lexiscan  normal.  Dizziness: Normal event monitor except for nonspecific 2 runs of NSVT.  Multifactorial.  Gets dizzy from sitting to standing position (positional).  He also takes tamsulosin  that can also cause dizziness.  He has soft blood pressures overall.  Strongly encouraged p.o. hydration and salt intake.  Kidney transplantation history: Follows with [facility] in [place].   20 minutes spent in reviewing prior records, more than 3 labs, ER notes, discussion of chest pain, results with the patient and documentation.  Medication Adjustments/Labs and Tests Ordered: Current medicines are reviewed at length with the patient today.  Concerns regarding medicines are outlined above.    Disposition:  Follow up as needed  Signed, Robert Baswell Arleta Maywood, MD, 02/10/2024 9:10 AM    [facility] at [facility] [address] [place], [place] [address]

## 2024-02-14 DIAGNOSIS — I129 Hypertensive chronic kidney disease with stage 1 through stage 4 chronic kidney disease, or unspecified chronic kidney disease: Secondary | ICD-10-CM | POA: Diagnosis not present

## 2024-02-14 DIAGNOSIS — N182 Chronic kidney disease, stage 2 (mild): Secondary | ICD-10-CM | POA: Diagnosis not present

## 2024-02-14 DIAGNOSIS — M150 Primary generalized (osteo)arthritis: Secondary | ICD-10-CM | POA: Diagnosis not present

## 2024-02-14 DIAGNOSIS — I10 Essential (primary) hypertension: Secondary | ICD-10-CM | POA: Diagnosis not present

## 2024-02-14 DIAGNOSIS — E7800 Pure hypercholesterolemia, unspecified: Secondary | ICD-10-CM | POA: Diagnosis not present

## 2024-02-23 DIAGNOSIS — Z23 Encounter for immunization: Secondary | ICD-10-CM | POA: Diagnosis not present

## 2024-04-30 ENCOUNTER — Other Ambulatory Visit: Payer: Self-pay

## 2024-04-30 ENCOUNTER — Emergency Department (HOSPITAL_COMMUNITY)

## 2024-04-30 ENCOUNTER — Encounter (HOSPITAL_COMMUNITY): Payer: Self-pay

## 2024-04-30 ENCOUNTER — Emergency Department (HOSPITAL_COMMUNITY)
Admission: EM | Admit: 2024-04-30 | Discharge: 2024-04-30 | Disposition: A | Discharge status: Home / Self Care | Attending: Emergency Medicine | Admitting: Emergency Medicine

## 2024-04-30 DIAGNOSIS — M25512 Pain in left shoulder: Secondary | ICD-10-CM | POA: Diagnosis present

## 2024-04-30 DIAGNOSIS — M7532 Calcific tendinitis of left shoulder: Secondary | ICD-10-CM | POA: Insufficient documentation

## 2024-04-30 MED ORDER — HYDROCODONE-ACETAMINOPHEN 5-325 MG PO TABS: 1.0000 | ORAL_TABLET | Freq: Four times a day (QID) | ORAL | 0 refills | Status: AC | PRN

## 2024-04-30 MED ORDER — HYDROCODONE-ACETAMINOPHEN 5-325 MG PO TABS
1.0000 | ORAL_TABLET | Freq: Once | ORAL | Status: AC
  Administered 2024-04-30: 1 via ORAL
  Filled 2024-04-30: qty 1

## 2024-04-30 NOTE — ED Triage Notes (Signed)
 Patient come in POV for complaint of left side shoulder pain that radiates down arm. 10/10. Stated it started last night, heating pad did not help. Stated hx of kidney transplant and a hip replacement.

## 2024-04-30 NOTE — Discharge Instructions (Addendum)
 You have been given a sling to rest your shoulder and help it to heal, however will be very important that you do not let it freeze up.  Make sure you are frequently moving your shoulder and at least a little circles to make sure you are maintaining range of motion.  Plan follow-up with Dr. Margrette for further evaluation and management of today's findings.  You have been prescribed a short course of pain medication, do not drive within 4 hours of taking this as it will make you drowsy.  I also recommend applying ice to your shoulder is much as comfortable for the next 2 days.  Starting on Wednesday you can also apply a heating pad for 20 minutes several times daily.

## 2024-04-30 NOTE — ED Provider Notes (Signed)
 [place] EMERGENCY DEPARTMENT AT [facility] Provider Note   CSN: [id number] Arrival date & time: 04/30/24  1102     Patient presents with: Shoulder Pain   Robert Durham is a 68 y.o. male presenting for evaluation of left shoulder pain for which started last night.  He has a history of pain in his shoulder and neck region and last night when trying to sleep on that side he had increased pain.  He describes it as aching and sharp and worse with movement, better at rest.  He denies any new injury or overuse.  He has had similar symptoms in the past but less severe.  He has taken Tylenol  without relief.  He denies weakness or numbness in his arm.  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   The history is provided by the patient.       Prior to Admission medications   Medication Sig Start Date End Date Taking? Authorizing Provider  HYDROcodone -acetaminophen  (NORCO/VICODIN) 5-325 MG tablet Take 1 tablet by mouth every 6 (six) hours as needed for moderate pain (pain score 4-6) or severe pain (pain score 7-10). 04/30/24  Yes Olevia Westervelt, Mliss, PA-C  amoxicillin  (AMOXIL ) 500 MG capsule Take 500 mg by mouth 2 (two) times daily. 09/27/23   [provider]  atorvastatin (LIPITOR) 10 MG tablet Take 10 mg by mouth daily with breakfast.     [provider]  cycloSPORINE  (SANDIMMUNE ) 100 MG capsule Take 100 mg by mouth 2 (two) times daily. Take with 25 mg tablet to equal 125 mg twice daily    [provider]  cycloSPORINE  (SANDIMMUNE ) 25 MG capsule Take 25 mg by mouth 2 (two) times daily. Take with 100 mg tablet to equal dose of 125 mg twice daily    [provider]  esomeprazole (NEXIUM) 20 MG capsule Take 20 mg by mouth daily at 12 noon.    [provider]  famotidine  (PEPCID ) 20 MG tablet Take 20 mg by mouth daily. 10/25/23   [provider]  methocarbamol  (ROBAXIN ) 500 MG tablet Take 1 tablet (500 mg total) by mouth  2 (two) times daily. 11/02/23   Eben Choinski, PA-C  predniSONE  (DELTASONE ) 5 MG tablet Take 5 mg by mouth daily. 10/20/23   [provider]  promethazine  (PHENERGAN ) 25 MG tablet Take 25 mg by mouth every 6 (six) hours as needed for nausea or vomiting.    [provider]  tamsulosin  (FLOMAX ) 0.4 MG CAPS capsule Take 0.4 mg by mouth daily after supper. 07/15/15   [provider]  vitamin B-12 (CYANOCOBALAMIN) 1000 MCG tablet Take 1,000 mcg by mouth daily.    [provider]  vitamin C (ASCORBIC ACID) 500 MG tablet Take 500 mg by mouth daily. 12/23/14   [provider]    Allergies: Codeine, Enalapril maleate, Nsaids, Tape, Tolmetin, and Vasotec    Review of Systems  Constitutional:  Negative for fever.  Respiratory:  Negative for shortness of breath.   Cardiovascular:  Negative for chest pain and palpitations.  Musculoskeletal:  Positive for arthralgias. Negative for joint swelling and myalgias.  Skin: Negative.   Neurological:  Negative for weakness and numbness.    Updated Vital Signs BP (!) 119/98 (BP Location: Right Arm)   Pulse 69   Temp 98.3 F (36.8 C) (Oral)   Resp 16   Ht 5' 11 (1.803 m)   Wt 83.9 kg   SpO2 99%   BMI 25.80 kg/m  Physical Exam Constitutional:      Appearance: He is well-developed.  HENT:     Head: Atraumatic.  Cardiovascular:     Comments: Pulses equal bilaterally Musculoskeletal:        General: Tenderness present.     Left shoulder: Bony tenderness present. No swelling or deformity. Decreased range of motion.     Cervical back: Normal range of motion.     Comments: Tender to palpation anterior left proximal humerus without palpable deformity or edema.  Skin:    General: Skin is warm and dry.  Neurological:     General: No focal deficit present.     Mental Status: He is alert.     Sensory: No sensory deficit.     Motor: No weakness.     Deep Tendon Reflexes: Reflexes normal.     Comments: Equal grip  strength, sensation is intact in bilateral forearms and hands.  Radial pulses are intact.     (all labs ordered are listed, but only abnormal results are displayed) Labs Reviewed - No data to display  EKG: None  Radiology: No results found. Results for orders placed or performed during the hospital encounter of 02/08/24  Urinalysis, Routine w reflex microscopic -Urine, Clean Catch   Collection Time: 02/08/24 10:17 AM  Result Value Ref Range   Color, Urine YELLOW YELLOW   APPearance CLEAR CLEAR   Specific Gravity, Urine 1.030 1.005 - 1.030   pH 6.0 5.0 - 8.0   Glucose, UA NEGATIVE NEGATIVE mg/dL   Hgb urine dipstick NEGATIVE NEGATIVE   Bilirubin Urine NEGATIVE NEGATIVE   Ketones, ur NEGATIVE NEGATIVE mg/dL   Protein, ur NEGATIVE NEGATIVE mg/dL   Nitrite NEGATIVE NEGATIVE   Leukocytes,Ua NEGATIVE NEGATIVE   DG Shoulder Left Result Date: 04/30/2024 EXAM: 1 VIEW(S) XRAY OF THE LEFT SHOULDER 04/30/2024 02:57:00 PM COMPARISON: 07/28/2016 CLINICAL HISTORY: Pain. FINDINGS: BONES AND JOINTS: Glenohumeral joint is normally aligned. No acute fracture or dislocation. There is mild glenohumeral osteophytosis with mild joint space narrowing, which appears slightly increased. Calcification over the superolateral aspect of the humeral head suggestive of calcific tendinosis, new from prior. The [facility] joint demonstrates mild acromioclavicular joint space narrowing and osteophytes, overall similar to prior. Overall, findings are similar to the prior study of 07/28/2016. SOFT TISSUES: No abnormal calcifications. Visualized lung is unremarkable. IMPRESSION: 1. Calcification over the superolateral humeral head suggestive of calcific tendinosis, new from prior. 2. 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 Electronically signed by: Donnice Mania MD 04/30/2024 03:53 PM EST RP Workstation: [id number]      Procedures   Medications  Ordered in the ED  HYDROcodone -acetaminophen  (NORCO/VICODIN) 5-325 MG per tablet 1 tablet (1 tablet Oral Given 04/30/24 1517)                                    Medical Decision Making Patient presenting with left shoulder pain, known arthritis, denies any new injury.  Differential diagnosis including worsening arthritis, shoulder strain/sprain.  He denies chest pain or shortness of breath, doubt this represents ACS or chest pathology as exam localizes his tenderness to his anterior shoulder and is reproducible.  Imaging was obtained confirming worsening arthritis along with calcific tendinitis.  Patient was placed in a sling but cautioned regarding complete immobility of the shoulder, range of motion activity was demonstrated.  He will need orthopedic follow-up, he is referred to our local orthopedist  for further care and management.  He cannot take NSAIDs given his renal history.  He was prescribed hydrocodone .  We also discussed role of ice and heat.  Ortho BTK referral was given.  Amount and/or Complexity of Data Reviewed Radiology: ordered.    Details: X-ray reviewed, agree with interpretation, DJD, suggestion of calcific tendinitis.  Risk Prescription drug management.        Final diagnoses:  Calcific tendinitis of left shoulder    ED Discharge Orders          Ordered    HYDROcodone -acetaminophen  (NORCO/VICODIN) 5-325 MG tablet  Every 6 hours PRN        04/30/24 1637               Nylen Creque, PA-C 05/03/24 9356    Melvenia Motto, MD 05/05/24 2209

## 2024-06-04 ENCOUNTER — Encounter: Payer: Self-pay | Admitting: *Deleted

## 2024-06-04 NOTE — Progress Notes (Signed)
 Robert Durham                                          MRN: [id number]   06/04/2024   The [facility] Quality Team Specialist reviewed this patient medical record for the purposes of chart review for care gap closure. The following were reviewed: chart review for care gap closure-diabetic eye exam and glycemic status assessment.    [facility] Quality Team

## 2024-06-05 ENCOUNTER — Other Ambulatory Visit (HOSPITAL_COMMUNITY): Payer: Self-pay | Admitting: Family Medicine

## 2024-06-05 ENCOUNTER — Ambulatory Visit (HOSPITAL_COMMUNITY)
Admission: RE | Admit: 2024-06-05 | Discharge: 2024-06-05 | Disposition: A | Discharge status: Home / Self Care | Source: Ambulatory Visit | Attending: Family Medicine | Admitting: Family Medicine

## 2024-06-05 DIAGNOSIS — M150 Primary generalized (osteo)arthritis: Secondary | ICD-10-CM | POA: Insufficient documentation

## 2024-06-06 ENCOUNTER — Emergency Department (HOSPITAL_COMMUNITY)

## 2024-06-06 ENCOUNTER — Emergency Department (HOSPITAL_COMMUNITY)
Admission: EM | Admit: 2024-06-06 | Discharge: 2024-06-06 | Disposition: A | Discharge status: Home / Self Care | Attending: Emergency Medicine | Admitting: Emergency Medicine

## 2024-06-06 ENCOUNTER — Encounter (HOSPITAL_COMMUNITY): Payer: Self-pay | Admitting: Emergency Medicine

## 2024-06-06 ENCOUNTER — Other Ambulatory Visit: Payer: Self-pay

## 2024-06-06 DIAGNOSIS — I10 Essential (primary) hypertension: Secondary | ICD-10-CM | POA: Diagnosis not present

## 2024-06-06 DIAGNOSIS — M79604 Pain in right leg: Secondary | ICD-10-CM | POA: Diagnosis present

## 2024-06-06 DIAGNOSIS — E119 Type 2 diabetes mellitus without complications: Secondary | ICD-10-CM | POA: Insufficient documentation

## 2024-06-06 DIAGNOSIS — M5431 Sciatica, right side: Secondary | ICD-10-CM | POA: Insufficient documentation

## 2024-06-06 MED ORDER — OXYCODONE-ACETAMINOPHEN 5-325 MG PO TABS: 1.0000 | ORAL_TABLET | Freq: Three times a day (TID) | ORAL | 0 refills | Status: AC | PRN

## 2024-06-06 MED ORDER — METHOCARBAMOL 500 MG PO TABS
1000.0000 mg | ORAL_TABLET | Freq: Once | ORAL | Status: AC
  Administered 2024-06-06: 1000 mg via ORAL
  Filled 2024-06-06: qty 2

## 2024-06-06 MED ORDER — OXYCODONE-ACETAMINOPHEN 5-325 MG PO TABS
1.0000 | ORAL_TABLET | Freq: Once | ORAL | Status: AC
  Administered 2024-06-06: 1 via ORAL
  Filled 2024-06-06: qty 1

## 2024-06-06 MED ORDER — GABAPENTIN 100 MG PO CAPS
100.0000 mg | ORAL_CAPSULE | Freq: Once | ORAL | Status: AC
  Administered 2024-06-06: 100 mg via ORAL
  Filled 2024-06-06: qty 1

## 2024-06-06 MED ORDER — METHOCARBAMOL 500 MG PO TABS: 500.0000 mg | ORAL_TABLET | Freq: Three times a day (TID) | ORAL | 0 refills | Status: AC | PRN

## 2024-06-06 MED ORDER — PREDNISONE 10 MG (21) PO TBPK: ORAL_TABLET | Freq: Every day | ORAL | 0 refills | Status: AC

## 2024-06-06 MED ORDER — KETOROLAC TROMETHAMINE 60 MG/2ML IM SOLN
30.0000 mg | Freq: Once | INTRAMUSCULAR | Status: AC
  Administered 2024-06-06: 30 mg via INTRAMUSCULAR
  Filled 2024-06-06: qty 2

## 2024-06-06 MED ORDER — GABAPENTIN 100 MG PO CAPS: 100.0000 mg | ORAL_CAPSULE | Freq: Three times a day (TID) | ORAL | 0 refills | Status: AC

## 2024-06-06 NOTE — Discharge Instructions (Addendum)
 Prescriptions were sent to your pharmacy: - Prednisone  is a steroid to limit inflammation and hopefully relieve your pain.  Take in tapering dose as prescribed. -Gabapentin  is a medication that can help with neuropathic pain.  You were prescribed a starting dose.  Take this 3 times daily.  Talk to your primary care doctor about possibly going up on the dosage.  - Methocarbamol  is a muscle relaxer.  Take this as needed. -Oxycodone  is a narcotic pain medication.  Take this only as needed.  Continue to follow-up with your primary care doctor.  Return to the emergency department for any new or worsening symptoms of concern.

## 2024-06-06 NOTE — ED Provider Notes (Signed)
 " [place] [facility] DEPARTMENT AT [facility] Provider Note   CSN: [id number] Arrival date & time: 06/06/24  9691     Patient presents with: Leg Pain (Right)   Robert Durham is a 68 y.o. male.    Leg Pain Patient presents for right leg pain.  Medical history includes chronic pain, DM, depression, HTN.  He states that he has chronic right leg pain and does get frequent steroid shots.  He takes hydrocodone  at home.  Last dose of hydrocodone  was earlier this evening.  Patient reports pain that is similar to his chronic pain but worse in severity.  Because of this, he has been unable to sleep.  He denies any other associated symptoms.     Prior to Admission medications  Medication Sig Start Date End Date Taking? Authorizing Provider  gabapentin  (NEURONTIN ) 100 MG capsule Take 1 capsule (100 mg total) by mouth 3 (three) times daily. 06/06/24 07/06/24 Yes Melvenia Motto, MD  methocarbamol  (ROBAXIN ) 500 MG tablet Take 1 tablet (500 mg total) by mouth every 8 (eight) hours as needed for muscle spasms. 06/06/24  Yes Melvenia Motto, MD  oxyCODONE -acetaminophen  (PERCOCET/ROXICET) 5-325 MG tablet Take 1 tablet by mouth every 8 (eight) hours as needed for up to 3 days for severe pain (pain score 7-10). 06/06/24 06/09/24 Yes Melvenia Motto, MD  predniSONE  (STERAPRED UNI-PAK 21 TAB) 10 MG (21) TBPK tablet Take by mouth daily. Take 6 tabs by mouth daily  for 2 days, then 5 tabs for 2 days, then 4 tabs for 2 days, then 3 tabs for 2 days, 2 tabs for 2 days, then 1 tab by mouth daily for 2 days 06/06/24  Yes Melvenia Motto, MD  amoxicillin  (AMOXIL ) 500 MG capsule Take 500 mg by mouth 2 (two) times daily. 09/27/23   [provider]  atorvastatin (LIPITOR) 10 MG tablet Take 10 mg by mouth daily with breakfast.     [provider]  cycloSPORINE  (SANDIMMUNE ) 100 MG capsule Take 100 mg by mouth 2 (two) times daily. Take with 25 mg tablet to equal 125 mg twice daily    [provider]   cycloSPORINE  (SANDIMMUNE ) 25 MG capsule Take 25 mg by mouth 2 (two) times daily. Take with 100 mg tablet to equal dose of 125 mg twice daily    [provider]  esomeprazole (NEXIUM) 20 MG capsule Take 20 mg by mouth daily at 12 noon.    [provider]  famotidine  (PEPCID ) 20 MG tablet Take 20 mg by mouth daily. 10/25/23   [provider]  HYDROcodone -acetaminophen  (NORCO/VICODIN) 5-325 MG tablet Take 1 tablet by mouth every 6 (six) hours as needed for moderate pain (pain score 4-6) or severe pain (pain score 7-10). 04/30/24   Idol, Julie, PA-C  methocarbamol  (ROBAXIN ) 500 MG tablet Take 1 tablet (500 mg total) by mouth 2 (two) times daily. 11/02/23   Idol, Julie, PA-C  predniSONE  (DELTASONE ) 5 MG tablet Take 5 mg by mouth daily. 10/20/23   [provider]  promethazine  (PHENERGAN ) 25 MG tablet Take 25 mg by mouth every 6 (six) hours as needed for nausea or vomiting.    [provider]  tamsulosin  (FLOMAX ) 0.4 MG CAPS capsule Take 0.4 mg by mouth daily after supper. 07/15/15   [provider]  vitamin B-12 (CYANOCOBALAMIN) 1000 MCG tablet Take 1,000 mcg by mouth daily.    [provider]  vitamin C (ASCORBIC ACID) 500 MG tablet Take 500 mg by mouth daily. 12/23/14  [provider]    Allergies: Codeine, Enalapril maleate, Nsaids, Tape, Tolmetin, and Vasotec    Review of Systems  Musculoskeletal:  Positive for arthralgias and myalgias.  All other systems reviewed and are negative.   Updated Vital Signs BP 124/71 (BP Location: Right Arm)   Pulse 63   Temp 98 F (36.7 C) (Oral)   Resp 20   Ht 5' 11 (1.803 m)   Wt 81.6 kg   SpO2 94%   BMI 25.10 kg/m   Physical Exam Vitals and nursing note reviewed.  Constitutional:      General: He is not in acute distress.    Appearance: Normal appearance. He is well-developed. He is not ill-appearing, toxic-appearing or diaphoretic.  HENT:     Head: Normocephalic and atraumatic.      Right Ear: External ear normal.     Left Ear: External ear normal.     Nose: Nose normal.     Mouth/Throat:     Mouth: Mucous membranes are moist.  Eyes:     Extraocular Movements: Extraocular movements intact.     Conjunctiva/sclera: Conjunctivae normal.  Cardiovascular:     Rate and Rhythm: Normal rate and regular rhythm.  Pulmonary:     Effort: Pulmonary effort is normal. No respiratory distress.  Abdominal:     General: There is no distension.     Palpations: Abdomen is soft.  Musculoskeletal:        General: No swelling, deformity or signs of injury.     Cervical back: Normal range of motion and neck supple.     Right lower leg: No edema.     Left lower leg: No edema.  Skin:    General: Skin is warm and dry.     Coloration: Skin is not jaundiced or pale.  Neurological:     General: No focal deficit present.     Mental Status: He is alert and oriented to person, place, and time.  Psychiatric:        Mood and Affect: Mood normal.        Behavior: Behavior normal.     (all labs ordered are listed, but only abnormal results are displayed) Labs Reviewed - No data to display  EKG: None  Radiology: DG HIP UNILAT WITH PELVIS 2-3 VIEWS RIGHT Result Date: 06/06/2024 EXAM: 2 or 3 VIEW(S) XRAY OF THE RIGHT HIP 06/05/2024 11:35:00 AM COMPARISON: CT abdomen and pelvis 02/08/2024. CLINICAL HISTORY: Osteoarthritis. FINDINGS: BONES AND JOINTS: Mild to moderate right hip arthrosis. No evidence of right hip fracture or dislocation. 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 There is no evidence of pelvic fracture, diastasis, or pathologic bone lesions. No malalignment. SOFT TISSUES: Soft tissues are unremarkable apart from vascular calcifications. IMPRESSION: 1. No evidence of right hip fracture or dislocation. 2. No evidence of pelvic fracture, diastasis, or pathologic bone lesions. 3. Right hip arthrosis, left hip arthroplasty. . . Electronically signed by: Francis Quam MD 06/06/2024 04:57 AM EST RP Workstation: [id number]   DG Lumbar Spine Complete Result Date: 06/06/2024 EXAM: 4 VIEW(S) XRAY OF THE LUMBAR SPINE 06/05/2024 11:35:00 AM COMPARISON: CT abdomen and pelvis 02/08/2024. CLINICAL HISTORY: Low back pain bilateral hip pain for over 2 months. No injury. Left hip replaced 1994. FINDINGS: LUMBAR SPINE: BONES: Vertebral body heights are maintained. There is a slight lumbar dextroscoliosis, apex at L3. No AP listhesis. There are 5 lumbar segments with hypoplastic ribs at T12. There is lumbar spondylosis. There is mild osteopenia. No evidence of  fractures. No pathologic focal bone lesion is seen. Both SI joints are patent. A left hip arthroplasty is partially visible. DISCS AND DEGENERATIVE CHANGES: Partial disc space loss with vacuum phenomenon noted from L2-L3 through L3-L4. Minimal to mild facet joint spurring again noted from L3-L4 down. The L1-L2 and L5-S1 discs are normal in height. SOFT TISSUES: Moderate aortoiliac calcific plaque. No visible nephrolithiasis. There are small renal shadows consistent with chronic . renal atrophy. IMPRESSION: 1. No acute fracture or malalignment of the lumbar spine. 2. Slight lumbar dextroscoliosis with apex at L3. 3. Prominent renal atrophy. 4. Osteopenia and degenerative changes. Electronically signed by: Francis Quam MD 06/06/2024 04:50 AM EST RP Workstation: [id number]   DG Hip Unilat W or Wo Pelvis 2-3 Views Right Result Date: 06/06/2024 EXAM: 2 OR 3 VIEW(S) XRAY OF THE RIGHT HIP 06/06/2024 04:04:00 AM COMPARISON: Comparison is made with the same study performed 06/05/2024 at 11:13 AM. CLINICAL HISTORY: R hip pain. FINDINGS: BONES AND JOINTS: Mild to moderate arthrosis at the right hip. No acute fracture of the right hip. No pelvic fracture or diastasis is seen. Left hip arthroplasty is partially visible. Comparison to the prior study reveals no significant internal change. LUMBAR SPINE: There is degenerative change in  the visualized lower lumbar spine. SOFT TISSUES: Iliofemoral calcific arteriosclerosis is again noted. IMPRESSION: 1. No pelvic fracture or diastasis. 2. Right hip degenerative change without evidence of fracture or dislocation. Electronically signed by: Francis Quam MD 06/06/2024 04:35 AM EST RP Workstation: [id number]     Procedures   Medications Ordered in the ED  gabapentin  (NEURONTIN ) capsule 100 mg (has no administration in time range)  oxyCODONE -acetaminophen  (PERCOCET/ROXICET) 5-325 MG per tablet 1 tablet (1 tablet Oral Given 06/06/24 0406)  ketorolac  (TORADOL ) injection 30 mg (30 mg Intramuscular Given 06/06/24 0407)  methocarbamol  (ROBAXIN ) tablet 1,000 mg (1,000 mg Oral Given 06/06/24 0406)  oxyCODONE -acetaminophen  (PERCOCET/ROXICET) 5-325 MG per tablet 1 tablet (1 tablet Oral Given 06/06/24 0605)                                    Medical Decision Making Amount and/or Complexity of Data Reviewed Radiology: ordered.  Risk Prescription drug management.   Patient presenting for acute on chronic right leg pain.  He describes location of pain as lateral right hip radiating down lateral aspect of leg, all the way to the ankle.  This is similar in characteristic to his chronic pain but worse in severity.  No significant tenderness present on exam.  There are no areas of deformity, warmth, or erythema.  Distal extremity is warm and well-perfused.  Multimodal pain control was ordered.  X-ray of right hip did not show any acute findings.  On reassessment, patient appears more comfortable than when he arrived.  He does endorse ongoing 7/10 severity pain.  Additional pain medication was ordered.  Patient reports never trying a steroid taper.  He was prescribed a 5 mg daily dose of prednisone  by his primary care doctor.  He has also never tried gabapentin .  Patient was prescribed steroid taper, gabapentin , in addition to methocarbamol  and short course of Percocet.  He does live with his wife  and son who are able to help him with mobility.  He was discharged in stable condition.     Final diagnoses:  Sciatica of right side    ED Discharge Orders          Ordered  gabapentin  (NEURONTIN ) 100 MG capsule  3 times daily        06/06/24 0624    methocarbamol  (ROBAXIN ) 500 MG tablet  Every 8 hours PRN        06/06/24 0624    predniSONE  (STERAPRED UNI-PAK 21 TAB) 10 MG (21) TBPK tablet  Daily        06/06/24 0624    oxyCODONE -acetaminophen  (PERCOCET/ROXICET) 5-325 MG tablet  Every 8 hours PRN        06/06/24 0624               Melvenia Motto, MD 06/06/24 [phone]  "

## 2024-06-06 NOTE — ED Triage Notes (Signed)
 BIB [facility] for chronic right leg pain, per EMS pt gets corticosteriod injection biweeky, per pt he took a hydrocodone  sometime this evening.
